# Patient Record
Sex: Female | Born: 1980 | Race: White | Hispanic: No | Marital: Single | Smoking: Current some day smoker
Health system: Southern US, Community
[De-identification: ages and names within clinical notes are randomized; demographics above are authoritative.]

## PROBLEM LIST (undated history)

## (undated) DIAGNOSIS — F1010 Alcohol abuse, uncomplicated: Secondary | ICD-10-CM

## (undated) DIAGNOSIS — S069X9A Unspecified intracranial injury with loss of consciousness of unspecified duration, initial encounter: Secondary | ICD-10-CM

## (undated) DIAGNOSIS — R7401 Elevation of levels of liver transaminase levels: Secondary | ICD-10-CM

## (undated) DIAGNOSIS — I609 Nontraumatic subarachnoid hemorrhage, unspecified: Secondary | ICD-10-CM

## (undated) DIAGNOSIS — G40909 Epilepsy, unspecified, not intractable, without status epilepticus: Secondary | ICD-10-CM

## (undated) DIAGNOSIS — S069XAA Unspecified intracranial injury with loss of consciousness status unknown, initial encounter: Secondary | ICD-10-CM

## (undated) HISTORY — PX: NO PAST SURGERIES: SHX2092

## (undated) HISTORY — PX: CHOLECYSTECTOMY: SHX55

## (undated) HISTORY — DX: Unspecified intracranial injury with loss of consciousness of unspecified duration, initial encounter: S06.9X9A

## (undated) HISTORY — DX: Elevation of levels of liver transaminase levels: R74.01

---

## 2013-03-05 ENCOUNTER — Emergency Department (HOSPITAL_COMMUNITY)
Admission: EM | Admit: 2013-03-05 | Discharge: 2013-03-05 | Disposition: A | Discharge status: Home / Self Care | Payer: Self-pay | Attending: Emergency Medicine | Admitting: Emergency Medicine

## 2013-03-05 ENCOUNTER — Encounter (HOSPITAL_COMMUNITY): Payer: Self-pay | Admitting: Emergency Medicine

## 2013-03-05 DIAGNOSIS — F17200 Nicotine dependence, unspecified, uncomplicated: Secondary | ICD-10-CM | POA: Insufficient documentation

## 2013-03-05 DIAGNOSIS — S0501XA Injury of conjunctiva and corneal abrasion without foreign body, right eye, initial encounter: Secondary | ICD-10-CM

## 2013-03-05 DIAGNOSIS — Z880 Allergy status to penicillin: Secondary | ICD-10-CM | POA: Insufficient documentation

## 2013-03-05 DIAGNOSIS — S058X9A Other injuries of unspecified eye and orbit, initial encounter: Secondary | ICD-10-CM | POA: Insufficient documentation

## 2013-03-05 DIAGNOSIS — S01111A Laceration without foreign body of right eyelid and periocular area, initial encounter: Secondary | ICD-10-CM

## 2013-03-05 MED ORDER — ERYTHROMYCIN 5 MG/GM OP OINT
TOPICAL_OINTMENT | Freq: Once | OPHTHALMIC | Status: AC
  Administered 2013-03-05: 1 via OPHTHALMIC
  Filled 2013-03-05: qty 1

## 2013-03-05 MED ORDER — FLUORESCEIN SODIUM 1 MG OP STRP
1.0000 | ORAL_STRIP | Freq: Once | OPHTHALMIC | Status: DC
  Filled 2013-03-05: qty 1

## 2013-03-05 MED ORDER — TETRACAINE HCL 0.5 % OP SOLN
1.0000 [drp] | Freq: Once | OPHTHALMIC | Status: AC
  Administered 2013-03-05: 1 [drp] via OPHTHALMIC
  Filled 2013-03-05: qty 2
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
## 2013-03-05 NOTE — ED Notes (Signed)
Tetracaine and fluorescein strip at bedside, irrigation of eye completed
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
## 2018-02-24 ENCOUNTER — Emergency Department (HOSPITAL_COMMUNITY): Payer: Managed Care, Other (non HMO)

## 2018-02-24 ENCOUNTER — Inpatient Hospital Stay (HOSPITAL_COMMUNITY)
Admission: EM | Admit: 2018-02-24 | Discharge: 2018-03-05 | DRG: 040 | Disposition: A | Discharge status: Rehabilitation Facility | Payer: Managed Care, Other (non HMO) | Attending: General Surgery | Admitting: General Surgery

## 2018-02-24 DIAGNOSIS — J9601 Acute respiratory failure with hypoxia: Secondary | ICD-10-CM | POA: Diagnosis present

## 2018-02-24 DIAGNOSIS — D62 Acute posthemorrhagic anemia: Secondary | ICD-10-CM | POA: Diagnosis present

## 2018-02-24 DIAGNOSIS — R740 Nonspecific elevation of levels of transaminase and lactic acid dehydrogenase [LDH]: Secondary | ICD-10-CM | POA: Diagnosis not present

## 2018-02-24 DIAGNOSIS — Y990 Civilian activity done for income or pay: Secondary | ICD-10-CM

## 2018-02-24 DIAGNOSIS — R0682 Tachypnea, not elsewhere classified: Secondary | ICD-10-CM | POA: Diagnosis not present

## 2018-02-24 DIAGNOSIS — R402123 Coma scale, eyes open, to pain, at hospital admission: Secondary | ICD-10-CM | POA: Diagnosis present

## 2018-02-24 DIAGNOSIS — F1010 Alcohol abuse, uncomplicated: Secondary | ICD-10-CM | POA: Diagnosis not present

## 2018-02-24 DIAGNOSIS — S069X3S Unspecified intracranial injury with loss of consciousness of 1 hour to 5 hours 59 minutes, sequela: Secondary | ICD-10-CM | POA: Diagnosis not present

## 2018-02-24 DIAGNOSIS — E876 Hypokalemia: Secondary | ICD-10-CM | POA: Diagnosis present

## 2018-02-24 DIAGNOSIS — G9340 Encephalopathy, unspecified: Secondary | ICD-10-CM | POA: Diagnosis present

## 2018-02-24 DIAGNOSIS — J189 Pneumonia, unspecified organism: Secondary | ICD-10-CM | POA: Diagnosis not present

## 2018-02-24 DIAGNOSIS — Z681 Body mass index (BMI) 19 or less, adult: Secondary | ICD-10-CM | POA: Diagnosis not present

## 2018-02-24 DIAGNOSIS — F1090 Alcohol use, unspecified, uncomplicated: Secondary | ICD-10-CM

## 2018-02-24 DIAGNOSIS — G40909 Epilepsy, unspecified, not intractable, without status epilepticus: Secondary | ICD-10-CM | POA: Diagnosis present

## 2018-02-24 DIAGNOSIS — R569 Unspecified convulsions: Secondary | ICD-10-CM

## 2018-02-24 DIAGNOSIS — F10239 Alcohol dependence with withdrawal, unspecified: Secondary | ICD-10-CM | POA: Diagnosis present

## 2018-02-24 DIAGNOSIS — S0181XA Laceration without foreign body of other part of head, initial encounter: Secondary | ICD-10-CM | POA: Diagnosis present

## 2018-02-24 DIAGNOSIS — E440 Moderate protein-calorie malnutrition: Secondary | ICD-10-CM | POA: Diagnosis present

## 2018-02-24 DIAGNOSIS — S066X9A Traumatic subarachnoid hemorrhage with loss of consciousness of unspecified duration, initial encounter: Secondary | ICD-10-CM | POA: Diagnosis present

## 2018-02-24 DIAGNOSIS — W19XXXA Unspecified fall, initial encounter: Secondary | ICD-10-CM | POA: Diagnosis present

## 2018-02-24 DIAGNOSIS — Z6822 Body mass index (BMI) 22.0-22.9, adult: Secondary | ICD-10-CM

## 2018-02-24 DIAGNOSIS — K117 Disturbances of salivary secretion: Secondary | ICD-10-CM | POA: Diagnosis present

## 2018-02-24 DIAGNOSIS — G40901 Epilepsy, unspecified, not intractable, with status epilepticus: Secondary | ICD-10-CM | POA: Diagnosis present

## 2018-02-24 DIAGNOSIS — R402353 Coma scale, best motor response, localizes pain, at hospital admission: Secondary | ICD-10-CM | POA: Diagnosis present

## 2018-02-24 DIAGNOSIS — S0101XA Laceration without foreign body of scalp, initial encounter: Secondary | ICD-10-CM | POA: Diagnosis present

## 2018-02-24 DIAGNOSIS — I10 Essential (primary) hypertension: Secondary | ICD-10-CM | POA: Diagnosis present

## 2018-02-24 DIAGNOSIS — S069X9A Unspecified intracranial injury with loss of consciousness of unspecified duration, initial encounter: Secondary | ICD-10-CM | POA: Diagnosis not present

## 2018-02-24 DIAGNOSIS — I609 Nontraumatic subarachnoid hemorrhage, unspecified: Secondary | ICD-10-CM | POA: Diagnosis not present

## 2018-02-24 DIAGNOSIS — R509 Fever, unspecified: Secondary | ICD-10-CM

## 2018-02-24 DIAGNOSIS — R402213 Coma scale, best verbal response, none, at hospital admission: Secondary | ICD-10-CM | POA: Diagnosis present

## 2018-02-24 DIAGNOSIS — R000 Tachycardia, unspecified: Secondary | ICD-10-CM

## 2018-02-24 LAB — URINALYSIS, ROUTINE W REFLEX MICROSCOPIC
Bilirubin Urine: NEGATIVE
Glucose, UA: NEGATIVE mg/dL
Ketones, ur: 80 mg/dL — AB
Nitrite: NEGATIVE
PROTEIN: 100 mg/dL — AB
SPECIFIC GRAVITY, URINE: 1.017 (ref 1.005–1.030)
pH: 5 (ref 5.0–8.0)

## 2018-02-24 LAB — PREPARE FRESH FROZEN PLASMA

## 2018-02-24 LAB — I-STAT ARTERIAL BLOOD GAS, ED
ACID-BASE DEFICIT: 7 mmol/L — AB (ref 0.0–2.0)
Bicarbonate: 18.6 mmol/L — ABNORMAL LOW (ref 20.0–28.0)
O2 SAT: 100 %
PH ART: 7.34 — AB (ref 7.350–7.450)
PO2 ART: 340 mmHg — AB (ref 83.0–108.0)
Patient temperature: 36.7
TCO2: 20 mmol/L — ABNORMAL LOW (ref 22–32)
pCO2 arterial: 34.5 mmHg (ref 32.0–48.0)

## 2018-02-24 LAB — COMPREHENSIVE METABOLIC PANEL
ALBUMIN: 3.8 g/dL (ref 3.5–5.0)
ALK PHOS: 147 U/L — AB (ref 38–126)
ALT: 90 U/L — ABNORMAL HIGH (ref 0–44)
ANION GAP: 25 — AB (ref 5–15)
AST: 359 U/L — ABNORMAL HIGH (ref 15–41)
BILIRUBIN TOTAL: 0.9 mg/dL (ref 0.3–1.2)
BUN: 5 mg/dL — ABNORMAL LOW (ref 6–20)
CALCIUM: 8.3 mg/dL — AB (ref 8.9–10.3)
CO2: 13 mmol/L — AB (ref 22–32)
CREATININE: 0.66 mg/dL (ref 0.44–1.00)
Chloride: 99 mmol/L (ref 98–111)
GFR calc non Af Amer: >60 mL/min (ref >60)
GLUCOSE: 146 mg/dL — AB (ref 70–99)
Potassium: 3.3 mmol/L — ABNORMAL LOW (ref 3.5–5.1)
SODIUM: 137 mmol/L (ref 135–145)
TOTAL PROTEIN: 7.2 g/dL (ref 6.5–8.1)

## 2018-02-24 LAB — RAPID URINE DRUG SCREEN, HOSP PERFORMED
AMPHETAMINES: NOT DETECTED
Barbiturates: NOT DETECTED
Benzodiazepines: NOT DETECTED
Cocaine: NOT DETECTED
Opiates: POSITIVE — AB
Tetrahydrocannabinol: NOT DETECTED

## 2018-02-24 LAB — CBC
HCT: 37.3 % (ref 36.0–46.0)
Hemoglobin: 11.6 g/dL — ABNORMAL LOW (ref 12.0–15.0)
MCH: 36.7 pg — AB (ref 26.0–34.0)
MCHC: 31.1 g/dL (ref 30.0–36.0)
MCV: 118 fL — AB (ref 80.0–100.0)
PLATELETS: 177 10*3/uL (ref 150–400)
RBC: 3.16 MIL/uL — ABNORMAL LOW (ref 3.87–5.11)
RDW: 13.4 % (ref 11.5–15.5)
WBC: 13.7 10*3/uL — ABNORMAL HIGH (ref 4.0–10.5)
nRBC: 0 % (ref 0.0–0.2)

## 2018-02-24 LAB — BPAM FFP
BLOOD PRODUCT EXPIRATION DATE: 201911162359
Blood Product Expiration Date: 201911162359
ISSUE DATE / TIME: 201911111941
ISSUE DATE / TIME: 201911111941
Unit Type and Rh: 6200
Unit Type and Rh: 6200

## 2018-02-24 LAB — PROTIME-INR
INR: 1.08
Prothrombin Time: 13.9 seconds (ref 11.4–15.2)

## 2018-02-24 LAB — I-STAT CHEM 8, ED
BUN: 4 mg/dL — ABNORMAL LOW (ref 6–20)
CALCIUM ION: 1.02 mmol/L — AB (ref 1.15–1.40)
CHLORIDE: 102 mmol/L (ref 98–111)
Creatinine, Ser: 0.4 mg/dL — ABNORMAL LOW (ref 0.44–1.00)
GLUCOSE: 146 mg/dL — AB (ref 70–99)
HCT: 39 % (ref 36.0–46.0)
Hemoglobin: 13.3 g/dL (ref 12.0–15.0)
POTASSIUM: 3.5 mmol/L (ref 3.5–5.1)
Sodium: 136 mmol/L (ref 135–145)
TCO2: 17 mmol/L — ABNORMAL LOW (ref 22–32)

## 2018-02-24 LAB — I-STAT CG4 LACTIC ACID, ED: LACTIC ACID, VENOUS: 15.3 mmol/L — AB (ref 0.5–1.9)

## 2018-02-24 LAB — ABO/RH: ABO/RH(D): O POS

## 2018-02-24 LAB — ETHANOL: Alcohol, Ethyl (B): 11 mg/dL — ABNORMAL HIGH (ref <10)

## 2018-02-24 LAB — TRIGLYCERIDES: Triglycerides: 148 mg/dL (ref <150)

## 2018-02-24 MED ORDER — POTASSIUM CHLORIDE IN NACL 20-0.9 MEQ/L-% IV SOLN
INTRAVENOUS | Status: DC
  Administered 2018-02-24 – 2018-03-02 (×10): via INTRAVENOUS
  Filled 2018-02-24 (×11): qty 1000

## 2018-02-24 MED ORDER — LORAZEPAM 2 MG/ML IJ SOLN
INTRAMUSCULAR | Status: AC
  Filled 2018-02-24: qty 1

## 2018-02-24 MED ORDER — FENTANYL 2500MCG IN NS 250ML (10MCG/ML) PREMIX INFUSION
25.0000 ug/h | INTRAVENOUS | Status: DC
  Administered 2018-02-24: 50 ug/h via INTRAVENOUS
  Filled 2018-02-24: qty 250

## 2018-02-24 MED ORDER — ETOMIDATE 2 MG/ML IV SOLN
INTRAVENOUS | Status: AC | PRN
  Administered 2018-02-24: 20 mg via INTRAVENOUS

## 2018-02-24 MED ORDER — FENTANYL CITRATE (PF) 100 MCG/2ML IJ SOLN
INTRAMUSCULAR | Status: AC | PRN
  Administered 2018-02-24: 100 ug via INTRAVENOUS

## 2018-02-24 MED ORDER — LORAZEPAM 2 MG/ML IJ SOLN: 2.0000 mg | Freq: Once | INTRAMUSCULAR | Status: DC

## 2018-02-24 MED ORDER — LORAZEPAM 2 MG/ML IJ SOLN
INTRAMUSCULAR | Status: AC | PRN
  Administered 2018-02-24: 2 mg via INTRAVENOUS

## 2018-02-24 MED ORDER — FENTANYL CITRATE (PF) 100 MCG/2ML IJ SOLN
INTRAMUSCULAR | Status: AC
  Filled 2018-02-24: qty 2

## 2018-02-24 MED ORDER — ORAL CARE MOUTH RINSE
15.0000 mL | OROMUCOSAL | Status: DC
  Administered 2018-02-24 – 2018-02-25 (×5): 15 mL via OROMUCOSAL

## 2018-02-24 MED ORDER — LEVETIRACETAM IN NACL 1000 MG/100ML IV SOLN
1000.0000 mg | Freq: Once | INTRAVENOUS | Status: AC
  Administered 2018-02-24: 1000 mg via INTRAVENOUS

## 2018-02-24 MED ORDER — SODIUM CHLORIDE 0.9 % IV SOLN
INTRAVENOUS | Status: AC | PRN
  Administered 2018-02-24: 1000 mL via INTRAVENOUS

## 2018-02-24 MED ORDER — PROPOFOL 1000 MG/100ML IV EMUL
5.0000 ug/kg/min | Freq: Once | INTRAVENOUS | Status: AC
  Administered 2018-02-24: 10 ug/kg/min via INTRAVENOUS

## 2018-02-24 MED ORDER — PROPOFOL 1000 MG/100ML IV EMUL
INTRAVENOUS | Status: AC
  Filled 2018-02-24: qty 100

## 2018-02-24 MED ORDER — LEVETIRACETAM IN NACL 1000 MG/100ML IV SOLN
1000.0000 mg | INTRAVENOUS | Status: AC
  Administered 2018-02-24: 1000 mg via INTRAVENOUS
  Filled 2018-02-24: qty 100

## 2018-02-24 MED ORDER — CHLORHEXIDINE GLUCONATE 0.12% ORAL RINSE (MEDLINE KIT)
15.0000 mL | Freq: Two times a day (BID) | OROMUCOSAL | Status: DC
  Administered 2018-02-24 – 2018-02-25 (×2): 15 mL via OROMUCOSAL

## 2018-02-24 MED ORDER — FENTANYL CITRATE (PF) 100 MCG/2ML IJ SOLN
INTRAMUSCULAR | Status: AC | PRN
  Administered 2018-02-24: 50 ug via INTRAVENOUS

## 2018-02-24 MED ORDER — FENTANYL CITRATE (PF) 100 MCG/2ML IJ SOLN: 50.0000 ug | Freq: Once | INTRAMUSCULAR | Status: DC

## 2018-02-24 MED ORDER — PROPOFOL 1000 MG/100ML IV EMUL
0.0000 ug/kg/min | INTRAVENOUS | Status: DC
  Administered 2018-02-24: 20 ug/kg/min via INTRAVENOUS
  Administered 2018-02-25: 40 ug/kg/min via INTRAVENOUS
  Filled 2018-02-24: qty 100

## 2018-02-24 MED ORDER — ROCURONIUM BROMIDE 50 MG/5ML IV SOLN
INTRAVENOUS | Status: AC | PRN
  Administered 2018-02-24: 50 mg via INTRAVENOUS

## 2018-02-24 MED ORDER — FENTANYL BOLUS VIA INFUSION
50.0000 ug | INTRAVENOUS | Status: DC | PRN
  Administered 2018-02-24: 50 ug via INTRAVENOUS
  Filled 2018-02-24: qty 50

## 2018-02-24 NOTE — Progress Notes (Signed)
Patient transported from ED Trauma C to 4N 28 with no complications.

## 2018-02-24 NOTE — Progress Notes (Signed)
Patient transported to CT and back on ventilator with no complications.
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
## 2018-02-24 NOTE — Consult Note (Signed)
Reason for Consult: Subarachnoid hemorrhage Referring Physician: Dr. Alda Berthold [name] is an 37 y.o. female.  HPI: The patient is a 37 year old white female who was found body and unresponsive in the break room at a [employer] store's evening.  She was brought to [facility] via EMS.  Patient was unresponsive initially intubated.  She was worked up with a head CT which demonstrated bilateral sylvian fissure subarachnoid hemorrhage, a large right scalp soft tissue swelling, etc.  Patient has been seen by neurology and trauma surgery.  Dr. Redmond Pulling requested a neurosurgical consultation.  The patient was intubated about an hour ago and was given succinylcholine, Versed and Ativan and started on a propofol drip.  Dr. Redmond Pulling has repaired her scalp lacerations.  There is no family members around to provide history or consent.  I spoke with 1 of her coworkers at [employer] but he did have a lot of details and was not present for the event.  No past medical history on file.    No family history on file.  Social History:  has no tobacco, alcohol, and drug history on file.  Allergies: No Known Allergies  Medications:  I have reviewed the patient's current medications. Prior to Admission:  (Not in a hospital admission) Scheduled: . LORazepam  2 mg Intravenous Once   Continuous:  [id number] Anti-infectives (From admission, onward)   None       Results for orders placed or performed during the hospital encounter of 02/24/18 (from the past 48 hour(s))  Type and screen Ordered by PROVIDER DEFAULT     Status: None   Collection Time: 02/24/18  7:40 Little  Result Value Ref Range   ABO/RH(D) O POS    Antibody Screen NEG    Sample Expiration 02/27/2018    Unit Number [id number]    Blood Component Type RED CELLS,LR    Unit division 00    Status of Unit REL FROM [facility]    Unit tag comment EMERGENCY RELEASE    Transfusion Status OK TO TRANSFUSE    Crossmatch Result      NOT  NEEDED Performed at [facility], [address] [place], [place] [address]    Unit Number [id number]    Blood Component Type RED CELLS,LR    Unit division 00    Status of Unit REL FROM [facility]    Unit tag comment EMERGENCY RELEASE    Transfusion Status OK TO TRANSFUSE    Crossmatch Result NOT NEEDED   Prepare fresh frozen plasma     Status: None   Collection Time: 02/24/18  7:40 Little  Result Value Ref Range   Unit Number [id number]    Blood Component Type THW PLS APHR    Unit division A0    Status of Unit REL FROM [facility]    Unit tag comment EMERGENCY RELEASE    Transfusion Status OK TO TRANSFUSE    Unit Number [id number]    Blood Component Type THW PLS APHR    Unit division B0    Status of Unit REL FROM [facility]    Unit tag comment EMERGENCY RELEASE    Transfusion Status      OK TO TRANSFUSE Performed at [facility], [place] [address] [place], [place] [address]   Comprehensive metabolic panel     Status: Abnormal   Collection Time: 02/24/18  7:40 Little  Result Value Ref Range   Sodium 137 135 - 145 mmol/L   Potassium 3.3 (L) 3.5 -  5.1 mmol/L   Chloride 99 98 - 111 mmol/L   CO2 13 (L) 22 - 32 mmol/L   Glucose, Bld 146 (H) 70 - 99 mg/dL   BUN 5 (L) 6 - 20 mg/dL   Creatinine, Ser 0.66 0.44 - 1.00 mg/dL   Calcium 8.3 (L) 8.9 - 10.3 mg/dL   Total Protein 7.2 6.5 - 8.1 g/dL   Albumin 3.8 3.5 - 5.0 g/dL   AST 359 (H) 15 - 41 U/L   ALT 90 (H) 0 - 44 U/L   Alkaline Phosphatase 147 (H) 38 - 126 U/L   Total Bilirubin 0.9 0.3 - 1.2 mg/dL   GFR calc non Af Amer >60 >60 mL/min   GFR calc Af Amer >60 >60 mL/min    Comment: (NOTE) The eGFR has been calculated using the CKD EPI equation. This calculation has not been validated in all clinical situations. eGFR's persistently <60 mL/min signify possible Chronic Kidney Disease.    Anion gap 25 (H) 5 - 15    Comment: Performed at [facility], [place] [address] [place], [place] [address]  CBC     Status: Abnormal    Collection Time: 02/24/18  7:40 Little  Result Value Ref Range   WBC 13.7 (H) 4.0 - 10.5 K/uL   RBC 3.16 (L) 3.87 - 5.11 MIL/uL   Hemoglobin 11.6 (L) 12.0 - 15.0 g/dL   HCT 37.3 36.0 - 46.0 %   MCV 118.0 (H) 80.0 - 100.0 fL   MCH 36.7 (H) 26.0 - 34.0 pg   MCHC 31.1 30.0 - 36.0 g/dL   RDW 13.4 11.5 - 15.5 %   Platelets 177 150 - 400 K/uL   nRBC 0.0 0.0 - 0.2 %    Comment: Performed at [facility], [place] [address], [place], [place] [address]  Ethanol     Status: Abnormal   Collection Time: 02/24/18  7:40 Little  Result Value Ref Range   Alcohol, Ethyl (B) 11 (H) <10 mg/dL    Comment: (NOTE) Lowest detectable limit for serum alcohol is 10 mg/dL. For medical purposes only. Performed at [facility], [place] [address] [place], [place] [address]   Protime-INR     Status: None   Collection Time: 02/24/18  7:40 Little  Result Value Ref Range   Prothrombin Time 13.9 11.4 - 15.2 seconds   INR 1.08     Comment: Performed at [place] [address], [place], [place] [address]  ABO/Rh     Status: None   Collection Time: 02/24/18  7:40 Little  Result Value Ref Range   ABO/RH(D)      O POS Performed at [place] [address] [place], [place] [address]   I-Stat Chem 8, ED     Status: Abnormal   Collection Time: 02/24/18  7:46 Little  Result Value Ref Range   Sodium 136 135 - 145 mmol/L   Potassium 3.5 3.5 - 5.1 mmol/L   Chloride 102 98 - 111 mmol/L   BUN 4 (L) 6 - 20 mg/dL    Comment: QA FLAGS AND/OR RANGES MODIFIED BY DEMOGRAPHIC UPDATE ON 11/11 AT 2006   Creatinine, Ser 0.40 (L) 0.44 - 1.00 mg/dL   Glucose, Bld 146 (H) 70 - 99 mg/dL   Calcium, Ion 1.02 (L) 1.15 - 1.40 mmol/L   TCO2 17 (L) 22 - 32 mmol/L   Hemoglobin 13.3 12.0 - 15.0 g/dL   HCT 39.0 36.0 - 46.0 %  I-Stat CG4 Lactic Acid, ED     Status: Abnormal   Collection Time: 02/24/18  7:47 Little  Result Value Ref Range   Lactic Acid, Venous 15.30 (HH) 0.5 - 1.9 mmol/L   Comment NOTIFIED PHYSICIAN   I-Stat  arterial blood gas, ED     Status: Abnormal   Collection Time: 02/24/18  8:38 Little  Result Value Ref Range   pH, Arterial 7.340 (L) 7.350 - 7.450   pCO2 arterial 34.5 32.0 - 48.0 mmHg   pO2, Arterial 340.0 (H) 83.0 - 108.0 mmHg   Bicarbonate 18.6 (L) 20.0 - 28.0 mmol/L   TCO2 20 (L) 22 - 32 mmol/L   O2 Saturation 100.0 %   Acid-base deficit 7.0 (H) 0.0 - 2.0 mmol/L   Patient temperature 36.7 C    Collection site RADIAL, ALLEN'S TEST ACCEPTABLE    Drawn by Operator    Sample type ARTERIAL     Ct Head Wo Contrast  Result Date: Alyssa Little CLINICAL DATA:  Female of unknown age found down. Unknown exact trauma mechanism. Seizing, altered mental status. Intubated for airway protection. EXAM: CT HEAD WITHOUT CONTRAST CT MAXILLOFACIAL WITHOUT CONTRAST CT CERVICAL SPINE WITHOUT CONTRAST TECHNIQUE: Multidetector CT imaging of the head, cervical spine, and maxillofacial structures were performed using the standard protocol without intravenous contrast. Multiplanar CT image reconstructions of the cervical spine and maxillofacial structures were also generated. COMPARISON:  Portable chest 1938 hours today. FINDINGS: CT HEAD FINDINGS Brain: Small to moderate volume of subarachnoid hemorrhage along both sylvian fissures and the bilateral superior and lateral convexities. The basilar cisterns are spared. There is slightly more blood along the right sylvian fissure and hemisphere. Superimposed small volume of right lateral intraventricular hemorrhage. No subdural hemorrhage identified. No ventriculomegaly. No intracranial mass effect or midline shift. Basilar cisterns remain normal. Gray-white matter differentiation is within normal limits throughout the brain. No cortically based acute infarct identified. Vascular: No suspicious intracranial vascular hyperdensity. Skull: No skull fracture identified. Other: Large right anterior convexity scalp hematoma up to 2.3 centimeters in thickness with superimposed small  volume scalp soft tissue gas. Other scalp soft tissues are within normal limits. CT MAXILLOFACIAL FINDINGS Osseous: Mandible intact. Maxilla and zygoma intact. Nasal bones within normal limits. No acute facial fracture identified. Orbits: Orbital walls intact. Orbit soft tissues are within normal limits. Sinuses: Well pneumatized. There is fluid or debris in the right external auditory canal, but the right tympanic membrane and mastoids remain clear. Left middle ear and mastoids are clear. Soft tissues: Fluid in the pharynx in the setting of intubation. ET tube and oral enteric tube in place. Negative noncontrast deep soft tissue spaces of the face. CT CERVICAL SPINE FINDINGS Alignment: Mild straightening of cervical lordosis. Cervicothoracic junction alignment is within normal limits. Bilateral posterior element alignment is within normal limits. Skull base and vertebrae: Visualized skull base is intact. No atlanto-occipital dissociation. No cervical spine fracture. Soft tissues and spinal canal: No prevertebral fluid or swelling. No visible canal hematoma. Endotracheal tube and enteric tube course appropriately into the trachea and upper esophagus. Disc levels:  Minimal degenerative changes. Upper chest: Negative lung apices. Visible upper thoracic levels appear intact. IMPRESSION: 1. Small to moderate volume of presumed post-traumatic Subarachnoid Hemorrhage along both Sylvian fissures and the cerebral convexities, more so on the right. The basilar cisterns are spared. 2. Superimposed small volume right lateral Intraventricular Hemorrhage. No ventriculomegaly. 3. No cerebral hemorrhagic contusion identified. No intracranial mass effect. No acute cortically based infarct. 4. Large right anterior convexity  scalp hematoma but no underlying skull fracture identified. 5. The above critical value/emergent results were discussed by telephone with Dr. Redmond Pulling of Trauma Surgery at 2016 hours on Alyssa Little. 6. No face or  cervical spine fracture. No other acute traumatic injury identified. Electronically Signed   By: Genevie Ann M.D.   On: Alyssa Little 20:32   Ct Cervical Spine Wo Contrast  Result Date: Alyssa Little CLINICAL DATA:  Female of unknown age found down. Unknown exact trauma mechanism. Seizing, altered mental status. Intubated for airway protection. EXAM: CT HEAD WITHOUT CONTRAST CT MAXILLOFACIAL WITHOUT CONTRAST CT CERVICAL SPINE WITHOUT CONTRAST TECHNIQUE: Multidetector CT imaging of the head, cervical spine, and maxillofacial structures were performed using the standard protocol without intravenous contrast. Multiplanar CT image reconstructions of the cervical spine and maxillofacial structures were also generated. COMPARISON:  Portable chest 1938 hours today. FINDINGS: CT HEAD FINDINGS Brain: Small to moderate volume of subarachnoid hemorrhage along both sylvian fissures and the bilateral superior and lateral convexities. The basilar cisterns are spared. There is slightly more blood along the right sylvian fissure and hemisphere. Superimposed small volume of right lateral intraventricular hemorrhage. No subdural hemorrhage identified. No ventriculomegaly. No intracranial mass effect or midline shift. Basilar cisterns remain normal. Gray-white matter differentiation is within normal limits throughout the brain. No cortically based acute infarct identified. Vascular: No suspicious intracranial vascular hyperdensity. Skull: No skull fracture identified. Other: Large right anterior convexity scalp hematoma up to 2.3 centimeters in thickness with superimposed small volume scalp soft tissue gas. Other scalp soft tissues are within normal limits. CT MAXILLOFACIAL FINDINGS Osseous: Mandible intact. Maxilla and zygoma intact. Nasal bones within normal limits. No acute facial fracture identified. Orbits: Orbital walls intact. Orbit soft tissues are within normal limits. Sinuses: Well pneumatized. There is fluid or debris in the  right external auditory canal, but the right tympanic membrane and mastoids remain clear. Left middle ear and mastoids are clear. Soft tissues: Fluid in the pharynx in the setting of intubation. ET tube and oral enteric tube in place. Negative noncontrast deep soft tissue spaces of the face. CT CERVICAL SPINE FINDINGS Alignment: Mild straightening of cervical lordosis. Cervicothoracic junction alignment is within normal limits. Bilateral posterior element alignment is within normal limits. Skull base and vertebrae: Visualized skull base is intact. No atlanto-occipital dissociation. No cervical spine fracture. Soft tissues and spinal canal: No prevertebral fluid or swelling. No visible canal hematoma. Endotracheal tube and enteric tube course appropriately into the trachea and upper esophagus. Disc levels:  Minimal degenerative changes. Upper chest: Negative lung apices. Visible upper thoracic levels appear intact. IMPRESSION: 1. Small to moderate volume of presumed post-traumatic Subarachnoid Hemorrhage along both Sylvian fissures and the cerebral convexities, more so on the right. The basilar cisterns are spared. 2. Superimposed small volume right lateral Intraventricular Hemorrhage. No ventriculomegaly. 3. No cerebral hemorrhagic contusion identified. No intracranial mass effect. No acute cortically based infarct. 4. Large right anterior convexity scalp hematoma but no underlying skull fracture identified. 5. The above critical value/emergent results were discussed by telephone with Dr. Redmond Pulling of Trauma Surgery at 2016 hours on Alyssa Little. 6. No face or cervical spine fracture. No other acute traumatic injury identified. Electronically Signed   By: Genevie Ann M.D.   On: Alyssa Little 20:32   Dg Chest Port 1 View  Result Date: Alyssa Little CLINICAL DATA:  Seizure with fall. Intubation and OG tube placement. EXAM: PORTABLE CHEST 1 VIEW COMPARISON:  None. FINDINGS: Endotracheal tube terminates at the level of the  clavicles. Enteric tube courses  into the stomach with tip below the inferior margin of the image. Tubing projecting over the lateral right chest is presumably external to the patient. The cardiomediastinal silhouette is within normal limits. No airspace consolidation, edema, pleural effusion, pneumothorax is identified. No acute osseous abnormality is identified. IMPRESSION: Support devices as above. No evidence of acute airspace disease. Electronically Signed   By: Logan Bores M.D.   On: Alyssa Little 20:04   Ct Maxillofacial Wo Contrast  Result Date: Alyssa Little CLINICAL DATA:  Female of unknown age found down. Unknown exact trauma mechanism. Seizing, altered mental status. Intubated for airway protection. EXAM: CT HEAD WITHOUT CONTRAST CT MAXILLOFACIAL WITHOUT CONTRAST CT CERVICAL SPINE WITHOUT CONTRAST TECHNIQUE: Multidetector CT imaging of the head, cervical spine, and maxillofacial structures were performed using the standard protocol without intravenous contrast. Multiplanar CT image reconstructions of the cervical spine and maxillofacial structures were also generated. COMPARISON:  Portable chest 1938 hours today. FINDINGS: CT HEAD FINDINGS Brain: Small to moderate volume of subarachnoid hemorrhage along both sylvian fissures and the bilateral superior and lateral convexities. The basilar cisterns are spared. There is slightly more blood along the right sylvian fissure and hemisphere. Superimposed small volume of right lateral intraventricular hemorrhage. No subdural hemorrhage identified. No ventriculomegaly. No intracranial mass effect or midline shift. Basilar cisterns remain normal. Gray-white matter differentiation is within normal limits throughout the brain. No cortically based acute infarct identified. Vascular: No suspicious intracranial vascular hyperdensity. Skull: No skull fracture identified. Other: Large right anterior convexity scalp hematoma up to 2.3 centimeters in thickness with  superimposed small volume scalp soft tissue gas. Other scalp soft tissues are within normal limits. CT MAXILLOFACIAL FINDINGS Osseous: Mandible intact. Maxilla and zygoma intact. Nasal bones within normal limits. No acute facial fracture identified. Orbits: Orbital walls intact. Orbit soft tissues are within normal limits. Sinuses: Well pneumatized. There is fluid or debris in the right external auditory canal, but the right tympanic membrane and mastoids remain clear. Left middle ear and mastoids are clear. Soft tissues: Fluid in the pharynx in the setting of intubation. ET tube and oral enteric tube in place. Negative noncontrast deep soft tissue spaces of the face. CT CERVICAL SPINE FINDINGS Alignment: Mild straightening of cervical lordosis. Cervicothoracic junction alignment is within normal limits. Bilateral posterior element alignment is within normal limits. Skull base and vertebrae: Visualized skull base is intact. No atlanto-occipital dissociation. No cervical spine fracture. Soft tissues and spinal canal: No prevertebral fluid or swelling. No visible canal hematoma. Endotracheal tube and enteric tube course appropriately into the trachea and upper esophagus. Disc levels:  Minimal degenerative changes. Upper chest: Negative lung apices. Visible upper thoracic levels appear intact. IMPRESSION: 1. Small to moderate volume of presumed post-traumatic Subarachnoid Hemorrhage along both Sylvian fissures and the cerebral convexities, more so on the right. The basilar cisterns are spared. 2. Superimposed small volume right lateral Intraventricular Hemorrhage. No ventriculomegaly. 3. No cerebral hemorrhagic contusion identified. No intracranial mass effect. No acute cortically based infarct. 4. Large right anterior convexity scalp hematoma but no underlying skull fracture identified. 5. The above critical value/emergent results were discussed by telephone with Dr. Redmond Pulling of Trauma Surgery at 2016 hours on  Alyssa Little. 6. No face or cervical spine fracture. No other acute traumatic injury identified. Electronically Signed   By: Genevie Ann M.D.   On: Alyssa Little 20:32    ROS: Unobtainable Blood pressure 102/72, pulse (!) 136, temperature 98.6 F (37 C), resp. rate (!) 21, height _0  (1.549 m), weight 54.4 kg, SpO2  100 %. Estimated body mass index is 22.67 kg/m as calculated from the following:   Height as of this encounter: _0  (1.549 m).   Weight as of this encounter: 54.4 kg.  Physical Exam  General: A comatose intubated 37 year old white female with right scalp lacerations  HEENT: The patient's pupils are approximately 4 mm bilaterally.  She has conjugate gaze.  He has frontal scalp lacerations which have been suture repaired.  Neck: Supple in a cervical collar  Thorax: Symmetric  Abdomen: Soft  Extremities: Unremarkable  Neurologic exam: Patient's propofol has been discontinued.  He is somnolent but responds to noxious stimuli.  Glasgow Coma Scale 8 intubated, [id number].  She localizes and is purposeful bilaterally moving all 4 extremities.  Pupils are equal and somewhat dilated as above.  I reviewed the patient's head CT performed at [facility] today.  She has bilateral subarachnoid hemorrhage predominantly in the sylvian fissure and along the convexity.  Her basal cisterns are clear.  Is a small amount of intraventricular blood.  I have also reviewed patient's cervical CT performed at [facility] today.  There is minimal degenerative changes.  No acute fractures, soft tissue swelling, etc.  Assessment/Plan: Traumatic subarachnoid hemorrhage, intraventricular hemorrhage: We were preparing to place a emergency ventriculostomy to monitor her ICP when the patient's neurologic status began to improve.  I suspect she had a seizure or syncopal event and struck her head.  She seems to be improving and therefore I do not think she needs a ventriculostomy presently.  We plan to  minimize her sedation and will follow her clinical exam and plan to repeat her CAT scan tomorrow morning.  Alyssa Little Alyssa Little, Alyssa Little

## 2018-02-24 NOTE — H&P (Addendum)
History   Alyssa Little is an 37 y.o. female.   Chief Complaint: No chief complaint on file.   HPI 37 yo wf brought in as level 1 trauma from work. She reportedly fell with head trauma and seizures. Unclear which happened first. Found in break room convulsing with bleeding scalp wound. She received versed en route by EMS for sz.  In ED she received ativan for ongoing sz activity. EDP reported pt had lateral gaze. Intubated for low GCS. Blood sugar ok. For most of trauma workup pt was tachycardic at times up to 175 and HTN. She also recvd keppra in ED.  Unknown history.  No past medical history on file.    No family history on file. Social History:  has no tobacco, alcohol, and drug history on file.  Allergies  No Known Allergies  Home Medications   (Not in a hospital admission)  Trauma Course   Results for orders placed or performed during the hospital encounter of 02/24/18 (from the past 48 hour(s))  Type and screen Ordered by PROVIDER DEFAULT     Status: None   Collection Time: 02/24/18  7:40 PM  Result Value Ref Range   ABO/RH(D) O POS    Antibody Screen NEG    Sample Expiration 02/27/2018    Unit Number [id number]    Blood Component Type RED CELLS,LR    Unit division 00    Status of Unit REL FROM [facility]    Unit tag comment EMERGENCY RELEASE    Transfusion Status OK TO TRANSFUSE    Crossmatch Result      NOT NEEDED Performed at [facility], [address] [place], [place] [address]    Unit Number [id number]    Blood Component Type RED CELLS,LR    Unit division 00    Status of Unit REL FROM [facility]    Unit tag comment EMERGENCY RELEASE    Transfusion Status OK TO TRANSFUSE    Crossmatch Result NOT NEEDED   Prepare fresh frozen plasma     Status: None   Collection Time: 02/24/18  7:40 PM  Result Value Ref Range   Unit Number [id number]    Blood Component Type THW PLS APHR    Unit division A0    Status of Unit REL FROM [facility]    Unit  tag comment EMERGENCY RELEASE    Transfusion Status OK TO TRANSFUSE    Unit Number [id number]    Blood Component Type THW PLS APHR    Unit division B0    Status of Unit REL FROM [facility]    Unit tag comment EMERGENCY RELEASE    Transfusion Status      OK TO TRANSFUSE Performed at [facility], [place] [address] [place], [place] [address]   Comprehensive metabolic panel     Status: Abnormal   Collection Time: 02/24/18  7:40 PM  Result Value Ref Range   Sodium 137 135 - 145 mmol/L   Potassium 3.3 (L) 3.5 - 5.1 mmol/L   Chloride 99 98 - 111 mmol/L   CO2 13 (L) 22 - 32 mmol/L   Glucose, Bld 146 (H) 70 - 99 mg/dL   BUN 5 (L) 6 - 20 mg/dL   Creatinine, Ser 0.66 0.44 - 1.00 mg/dL   Calcium 8.3 (L) 8.9 - 10.3 mg/dL   Total Protein 7.2 6.5 - 8.1 g/dL   Albumin 3.8 3.5 - 5.0 g/dL   AST 359 (H) 15 - 41 U/L  ALT 90 (H) 0 - 44 U/L   Alkaline Phosphatase 147 (H) 38 - 126 U/L   Total Bilirubin 0.9 0.3 - 1.2 mg/dL   GFR calc non Af Amer >60 >60 mL/min   GFR calc Af Amer >60 >60 mL/min    Comment: (NOTE) The eGFR has been calculated using the CKD EPI equation. This calculation has not been validated in all clinical situations. eGFR's persistently <60 mL/min signify possible Chronic Kidney Disease.    Anion gap 25 (H) 5 - 15    Comment: Performed at [facility], [place] [address] [place], [place] [address]  CBC     Status: Abnormal   Collection Time: 02/24/18  7:40 PM  Result Value Ref Range   WBC 13.7 (H) 4.0 - 10.5 K/uL   RBC 3.16 (L) 3.87 - 5.11 MIL/uL   Hemoglobin 11.6 (L) 12.0 - 15.0 g/dL   HCT 37.3 36.0 - 46.0 %   MCV 118.0 (H) 80.0 - 100.0 fL   MCH 36.7 (H) 26.0 - 34.0 pg   MCHC 31.1 30.0 - 36.0 g/dL   RDW 13.4 11.5 - 15.5 %   Platelets 177 150 - 400 K/uL   nRBC 0.0 0.0 - 0.2 %    Comment: Performed at [facility], [place] [address] [place], [place] [address]  Ethanol     Status: Abnormal   Collection Time: 02/24/18  7:40 PM  Result Value Ref Range    Alcohol, Ethyl (B) 11 (H) <10 mg/dL    Comment: (NOTE) Lowest detectable limit for serum alcohol is 10 mg/dL. For medical purposes only. Performed at [facility], [place] [address] [place], [place] [address]   Protime-INR     Status: None   Collection Time: 02/24/18  7:40 PM  Result Value Ref Range   Prothrombin Time 13.9 11.4 - 15.2 seconds   INR 1.08     Comment: Performed at [place] [address], [place], [place] [address]  ABO/Rh     Status: None   Collection Time: 02/24/18  7:40 PM  Result Value Ref Range   ABO/RH(D)      O POS Performed at [place] [address] [place],  [address]   I-Stat Chem 8, ED     Status: Abnormal   Collection Time: 02/24/18  7:46 PM  Result Value Ref Range   Sodium 136 135 - 145 mmol/L   Potassium 3.5 3.5 - 5.1 mmol/L   Chloride 102 98 - 111 mmol/L   BUN 4 (L) 6 - 20 mg/dL    Comment: QA FLAGS AND/OR RANGES MODIFIED BY DEMOGRAPHIC UPDATE ON 11/11 AT 2006   Creatinine, Ser 0.40 (L) 0.44 - 1.00 mg/dL   Glucose, Bld 146 (H) 70 - 99 mg/dL   Calcium, Ion 1.02 (L) 1.15 - 1.40 mmol/L   TCO2 17 (L) 22 - 32 mmol/L   Hemoglobin 13.3 12.0 - 15.0 g/dL   HCT 39.0 36.0 - 46.0 %  I-Stat CG4 Lactic Acid, ED     Status: Abnormal   Collection Time: 02/24/18  7:47 PM  Result Value Ref Range   Lactic Acid, Venous 15.30 (HH) 0.5 - 1.9 mmol/L   Comment NOTIFIED PHYSICIAN   I-Stat arterial blood gas, ED     Status: Abnormal   Collection Time: 02/24/18  8:38 PM  Result Value Ref Range   pH, Arterial 7.340 (L) 7.350 - 7.450   pCO2 arterial 34.5 32.0 -  48.0 mmHg   pO2, Arterial 340.0 (H) 83.0 - 108.0 mmHg   Bicarbonate 18.6 (L) 20.0 - 28.0 mmol/L   TCO2 20 (L) 22 - 32 mmol/L   O2 Saturation 100.0 %   Acid-base deficit 7.0 (H) 0.0 - 2.0 mmol/L   Patient temperature 36.7 C    Collection site RADIAL, ALLEN'S TEST ACCEPTABLE    Drawn by Operator    Sample type ARTERIAL    Ct Head Wo Contrast  Result Date:  02/24/2018 CLINICAL DATA:  Female of unknown age found down. Unknown exact trauma mechanism. Seizing, altered mental status. Intubated for airway protection. EXAM: CT HEAD WITHOUT CONTRAST CT MAXILLOFACIAL WITHOUT CONTRAST CT CERVICAL SPINE WITHOUT CONTRAST TECHNIQUE: Multidetector CT imaging of the head, cervical spine, and maxillofacial structures were performed using the standard protocol without intravenous contrast. Multiplanar CT image reconstructions of the cervical spine and maxillofacial structures were also generated. COMPARISON:  Portable chest 1938 hours today. FINDINGS: CT HEAD FINDINGS Brain: Small to moderate volume of subarachnoid hemorrhage along both sylvian fissures and the bilateral superior and lateral convexities. The basilar cisterns are spared. There is slightly more blood along the right sylvian fissure and hemisphere. Superimposed small volume of right lateral intraventricular hemorrhage. No subdural hemorrhage identified. No ventriculomegaly. No intracranial mass effect or midline shift. Basilar cisterns remain normal. Gray-white matter differentiation is within normal limits throughout the brain. No cortically based acute infarct identified. Vascular: No suspicious intracranial vascular hyperdensity. Skull: No skull fracture identified. Other: Large right anterior convexity scalp hematoma up to 2.3 centimeters in thickness with superimposed small volume scalp soft tissue gas. Other scalp soft tissues are within normal limits. CT MAXILLOFACIAL FINDINGS Osseous: Mandible intact. Maxilla and zygoma intact. Nasal bones within normal limits. No acute facial fracture identified. Orbits: Orbital walls intact. Orbit soft tissues are within normal limits. Sinuses: Well pneumatized. There is fluid or debris in the right external auditory canal, but the right tympanic membrane and mastoids remain clear. Left middle ear and mastoids are clear. Soft tissues: Fluid in the pharynx in the setting of  intubation. ET tube and oral enteric tube in place. Negative noncontrast deep soft tissue spaces of the face. CT CERVICAL SPINE FINDINGS Alignment: Mild straightening of cervical lordosis. Cervicothoracic junction alignment is within normal limits. Bilateral posterior element alignment is within normal limits. Skull base and vertebrae: Visualized skull base is intact. No atlanto-occipital dissociation. No cervical spine fracture. Soft tissues and spinal canal: No prevertebral fluid or swelling. No visible canal hematoma. Endotracheal tube and enteric tube course appropriately into the trachea and upper esophagus. Disc levels:  Minimal degenerative changes. Upper chest: Negative lung apices. Visible upper thoracic levels appear intact. IMPRESSION: 1. Small to moderate volume of presumed post-traumatic Subarachnoid Hemorrhage along both Sylvian fissures and the cerebral convexities, more so on the right. The basilar cisterns are spared. 2. Superimposed small volume right lateral Intraventricular Hemorrhage. No ventriculomegaly. 3. No cerebral hemorrhagic contusion identified. No intracranial mass effect. No acute cortically based infarct. 4. Large right anterior convexity scalp hematoma but no underlying skull fracture identified. 5. The above critical value/emergent results were discussed by telephone with Dr. Redmond Pulling of Trauma Surgery at 2016 hours on 02/24/2018. 6. No face or cervical spine fracture. No other acute traumatic injury identified. Electronically Signed   By: Genevie Ann M.D.   On: 02/24/2018 20:32   Ct Cervical Spine Wo Contrast  Result Date: 02/24/2018 CLINICAL DATA:  Female of unknown age found down. Unknown exact trauma mechanism. Seizing, altered mental  status. Intubated for airway protection. EXAM: CT HEAD WITHOUT CONTRAST CT MAXILLOFACIAL WITHOUT CONTRAST CT CERVICAL SPINE WITHOUT CONTRAST TECHNIQUE: Multidetector CT imaging of the head, cervical spine, and maxillofacial structures were performed  using the standard protocol without intravenous contrast. Multiplanar CT image reconstructions of the cervical spine and maxillofacial structures were also generated. COMPARISON:  Portable chest 1938 hours today. FINDINGS: CT HEAD FINDINGS Brain: Small to moderate volume of subarachnoid hemorrhage along both sylvian fissures and the bilateral superior and lateral convexities. The basilar cisterns are spared. There is slightly more blood along the right sylvian fissure and hemisphere. Superimposed small volume of right lateral intraventricular hemorrhage. No subdural hemorrhage identified. No ventriculomegaly. No intracranial mass effect or midline shift. Basilar cisterns remain normal. Gray-white matter differentiation is within normal limits throughout the brain. No cortically based acute infarct identified. Vascular: No suspicious intracranial vascular hyperdensity. Skull: No skull fracture identified. Other: Large right anterior convexity scalp hematoma up to 2.3 centimeters in thickness with superimposed small volume scalp soft tissue gas. Other scalp soft tissues are within normal limits. CT MAXILLOFACIAL FINDINGS Osseous: Mandible intact. Maxilla and zygoma intact. Nasal bones within normal limits. No acute facial fracture identified. Orbits: Orbital walls intact. Orbit soft tissues are within normal limits. Sinuses: Well pneumatized. There is fluid or debris in the right external auditory canal, but the right tympanic membrane and mastoids remain clear. Left middle ear and mastoids are clear. Soft tissues: Fluid in the pharynx in the setting of intubation. ET tube and oral enteric tube in place. Negative noncontrast deep soft tissue spaces of the face. CT CERVICAL SPINE FINDINGS Alignment: Mild straightening of cervical lordosis. Cervicothoracic junction alignment is within normal limits. Bilateral posterior element alignment is within normal limits. Skull base and vertebrae: Visualized skull base is intact.  No atlanto-occipital dissociation. No cervical spine fracture. Soft tissues and spinal canal: No prevertebral fluid or swelling. No visible canal hematoma. Endotracheal tube and enteric tube course appropriately into the trachea and upper esophagus. Disc levels:  Minimal degenerative changes. Upper chest: Negative lung apices. Visible upper thoracic levels appear intact. IMPRESSION: 1. Small to moderate volume of presumed post-traumatic Subarachnoid Hemorrhage along both Sylvian fissures and the cerebral convexities, more so on the right. The basilar cisterns are spared. 2. Superimposed small volume right lateral Intraventricular Hemorrhage. No ventriculomegaly. 3. No cerebral hemorrhagic contusion identified. No intracranial mass effect. No acute cortically based infarct. 4. Large right anterior convexity scalp hematoma but no underlying skull fracture identified. 5. The above critical value/emergent results were discussed by telephone with Dr. Redmond Pulling of Trauma Surgery at 2016 hours on 02/24/2018. 6. No face or cervical spine fracture. No other acute traumatic injury identified. Electronically Signed   By: Genevie Ann M.D.   On: 02/24/2018 20:32   Dg Chest Port 1 View  Result Date: 02/24/2018 CLINICAL DATA:  Seizure with fall. Intubation and OG tube placement. EXAM: PORTABLE CHEST 1 VIEW COMPARISON:  None. FINDINGS: Endotracheal tube terminates at the level of the clavicles. Enteric tube courses into the stomach with tip below the inferior margin of the image. Tubing projecting over the lateral right chest is presumably external to the patient. The cardiomediastinal silhouette is within normal limits. No airspace consolidation, edema, pleural effusion, pneumothorax is identified. No acute osseous abnormality is identified. IMPRESSION: Support devices as above. No evidence of acute airspace disease. Electronically Signed   By: Logan Bores M.D.   On: 02/24/2018 20:04   Ct Maxillofacial Wo Contrast  Result Date:  02/24/2018 CLINICAL  DATA:  Female of unknown age found down. Unknown exact trauma mechanism. Seizing, altered mental status. Intubated for airway protection. EXAM: CT HEAD WITHOUT CONTRAST CT MAXILLOFACIAL WITHOUT CONTRAST CT CERVICAL SPINE WITHOUT CONTRAST TECHNIQUE: Multidetector CT imaging of the head, cervical spine, and maxillofacial structures were performed using the standard protocol without intravenous contrast. Multiplanar CT image reconstructions of the cervical spine and maxillofacial structures were also generated. COMPARISON:  Portable chest 1938 hours today. FINDINGS: CT HEAD FINDINGS Brain: Small to moderate volume of subarachnoid hemorrhage along both sylvian fissures and the bilateral superior and lateral convexities. The basilar cisterns are spared. There is slightly more blood along the right sylvian fissure and hemisphere. Superimposed small volume of right lateral intraventricular hemorrhage. No subdural hemorrhage identified. No ventriculomegaly. No intracranial mass effect or midline shift. Basilar cisterns remain normal. Gray-white matter differentiation is within normal limits throughout the brain. No cortically based acute infarct identified. Vascular: No suspicious intracranial vascular hyperdensity. Skull: No skull fracture identified. Other: Large right anterior convexity scalp hematoma up to 2.3 centimeters in thickness with superimposed small volume scalp soft tissue gas. Other scalp soft tissues are within normal limits. CT MAXILLOFACIAL FINDINGS Osseous: Mandible intact. Maxilla and zygoma intact. Nasal bones within normal limits. No acute facial fracture identified. Orbits: Orbital walls intact. Orbit soft tissues are within normal limits. Sinuses: Well pneumatized. There is fluid or debris in the right external auditory canal, but the right tympanic membrane and mastoids remain clear. Left middle ear and mastoids are clear. Soft tissues: Fluid in the pharynx in the setting of  intubation. ET tube and oral enteric tube in place. Negative noncontrast deep soft tissue spaces of the face. CT CERVICAL SPINE FINDINGS Alignment: Mild straightening of cervical lordosis. Cervicothoracic junction alignment is within normal limits. Bilateral posterior element alignment is within normal limits. Skull base and vertebrae: Visualized skull base is intact. No atlanto-occipital dissociation. No cervical spine fracture. Soft tissues and spinal canal: No prevertebral fluid or swelling. No visible canal hematoma. Endotracheal tube and enteric tube course appropriately into the trachea and upper esophagus. Disc levels:  Minimal degenerative changes. Upper chest: Negative lung apices. Visible upper thoracic levels appear intact. IMPRESSION: 1. Small to moderate volume of presumed post-traumatic Subarachnoid Hemorrhage along both Sylvian fissures and the cerebral convexities, more so on the right. The basilar cisterns are spared. 2. Superimposed small volume right lateral Intraventricular Hemorrhage. No ventriculomegaly. 3. No cerebral hemorrhagic contusion identified. No intracranial mass effect. No acute cortically based infarct. 4. Large right anterior convexity scalp hematoma but no underlying skull fracture identified. 5. The above critical value/emergent results were discussed by telephone with Dr. Redmond Pulling of Trauma Surgery at 2016 hours on 02/24/2018. 6. No face or cervical spine fracture. No other acute traumatic injury identified. Electronically Signed   By: Genevie Ann M.D.   On: 02/24/2018 20:32    Review of Systems  Unable to perform ROS: Intubated    Blood pressure 104/78, pulse (!) 138, temperature 98.6 F (37 C), resp. rate 16, height '5\' 1"'  (1.549 m), weight 54.4 kg, SpO2 100 %. Physical Exam  Vitals reviewed. Constitutional: She appears well-developed and well-nourished. She is cooperative. No distress. Cervical collar and nasal cannula in place.  HENT:  Head: Normocephalic. Head is with  contusion and with laceration. Head is without raccoon's eyes, without Battle's sign and without abrasion.    Right Ear: Hearing, tympanic membrane, external ear and ear canal normal. No lacerations. No drainage or tenderness. No foreign bodies. Tympanic membrane is  not perforated. No hemotympanum.  Left Ear: Hearing, tympanic membrane, external ear and ear canal normal. No lacerations. No drainage or tenderness. No foreign bodies. Tympanic membrane is not perforated. No hemotympanum.  Nose: Nose normal. No nose lacerations, sinus tenderness, nasal deformity or nasal septal hematoma. No epistaxis.  Mouth/Throat: Uvula is midline, oropharynx is clear and moist and mucous membranes are normal. No lacerations.  Head wrapped in acewrap and compression; 2 right forehead lacerations. More posterior is stellate with active pulsatile bleeding ((3in); more anterior lac is just in front of other lac, linear, 3in as well.   Eyes: Pupils are equal, round, and reactive to light. Conjunctivae and lids are normal. No scleral icterus.  Neck: Trachea normal. No JVD present. No spinous process tenderness and no muscular tenderness present. Carotid bruit is not present. No tracheal deviation present. No thyromegaly present.  Cardiovascular: Regular rhythm, normal heart sounds, intact distal pulses and normal pulses. Tachycardia present.  Respiratory: Effort normal and breath sounds normal. No accessory muscle usage. No respiratory distress. She exhibits no tenderness, no bony tenderness, no laceration and no crepitus.  GI: Soft. Normal appearance. She exhibits no distension. Bowel sounds are decreased. There is no tenderness. There is no rigidity, no rebound, no guarding and no CVA tenderness.  Musculoskeletal: Normal range of motion. She exhibits no edema or tenderness.  Lymphadenopathy:    She has no cervical adenopathy.  Neurological: She has normal reflexes. She is unresponsive. No cranial nerve deficit or sensory  deficit. GCS eye subscore is 2. GCS verbal subscore is 1. GCS motor subscore is 5.  Skin: Skin is warm, dry and intact. She is not diaphoretic.  Psychiatric: She has a normal mood and affect. Her speech is normal and behavior is normal.     Assessment/Plan Fall Complex Right forehead lacerations x 2 SAH IVH Seizure activity Elevated LFTs hypokalemia  Consults - Dr Arnoldo Morale and Leonel Ramsay Admit trauma ICU Repeat labs in am Maintain c spine SCDs.   Leighton Ruff. Redmond Pulling, MD, FACS General, Bariatric, & Minimally Invasive Surgery [facility]    Greer Pickerel 02/24/2018, 9:03 PM   Procedures

## 2018-02-24 NOTE — Progress Notes (Signed)
   02/24/18 2100  Clinical Encounter Type  Visited With Patient;Other (Comment)  Visit Type Critical Care  Referral From Nurse  Consult/Referral To Chaplain   Paged to the ED at 7:35 for this patient.  Upon arrival PT being evaluated.  A friend came and was taken to consult B.  Spoke with friend trying to obtain any family information.  Friend did not have any contact information.  We were able to get into the PT's cell phone and friend identified a name Catlin as the PT's Aunt.  Cell is [phone].  VM left for the Aunt to call the [facility] ED.  Let medical staff know a family member had been contacted.  PT continues to be evaluated and Chaplain available to assist further when family calls back.  Chaplain Agustin Cree

## 2018-02-24 NOTE — Consult Note (Signed)
Neurology Consultation Reason for Consult: Seizures Referring Physician: Tegeler, C  CC: Seizure  History is obtained from: EMS  HPI: Alyssa Little is a 37 y.o. female who presents to the emergency department with fall, head trauma, seizures which started earlier tonight.  Unclear last time she was well, but she was found in the break room convulsing with bloody injury to her scalp.  It is unclear which came first.  She was seen to be having abnormal movements, which I suspect are seizure versus posturing on route, and was given Versed and subsequently Ativan.  Currently there is no known past medical history, no known substance use history, she does have cigarettes on her person, but otherwise we have very limited information.   ROS: Unable to obtain due to altered mental status.   Past medical history: Unable to obtain due to altered mental status  Family history: Unable to obtain due to altered mental status  Social History: She had cigarettes on her person, otherwise unable to obtain due to altered mental status  Exam: Current vital signs: BP (!) 150/100   Pulse (!) 126   Temp (!) 97.5 F (36.4 C)   Resp 12   Ht 5\' 7"  (1.702 m)   Wt 54.4 kg   LMP  (LMP Unknown)   SpO2 100%   BMI 18.79 kg/m  Vital signs in last 24 hours: Temp:  [97.5 F (36.4 C)] 97.5 F (36.4 C) (11/11 1943) Pulse Rate:  [126-142] 126 (11/11 1942) Resp:  [9-24] 12 (11/11 1942) BP: (150)/(100) 150/100 (11/11 1940) SpO2:  [49 %-100 %] 100 % (11/11 1942) Weight:  [54.4 kg] 54.4 kg (11/11 1946)   Physical Exam  Constitutional: Appears well-developed and well-nourished.  Psych: She is unresponsive Eyes: No scleral injection HENT: She has large bloody bandage on her forehead Head: Normocephalic.  Cardiovascular: Normal rate and regular rhythm.  Respiratory: Effort normal, non-labored breathing GI: Soft.  No distension. There is no tenderness.  Skin: WDI  Neuro: Mental Status: Patient  is obtunded, she does not follow commands. Cranial Nerves: II: She does not blink to threat. Pupils are equal, round, and reactive to light.   III,IV, VI: She has a leftward of midline gaze, but not forcefully deviated V: VII: She blinks to eyelid stimuli Motor: She has no response to noxious stimuli including nailbed pressure in any extremity Sensory: As above  Deep Tendon Reflexes: 2+ and symmetric in the patellae.  No clonus Cerebellar: She does not perform  I have reviewed labs in epic and the results pertinent to this consultation are: Chem-8- creatinine 0.4, sodium 136, potassium 3.5 Lactate 15.3   I have reviewed the images obtained: CT head-perisylvian SAH bilaterally.   Impression: 37 yo F with unwitnessed falls with seizures and extensive traumatic SAH. Unclear how much of her mental  status is due to SAH/TBI versus seizure.  I do think that a EEG is indicated.  Unfortunately, we do not have any past medical history or social history at this time.  Recommendations: 1) Keppra 2 g IV x1 2) stat EEG 3) neurosurgical consult, defer management of TBI/SAH to neurosurgery 4) UDS 5) obtain PMH  Ritta Slot, MD [facility] [phone]  If 7pm- 7am, please page neurology on call as listed in [facility].

## 2018-02-24 NOTE — Procedures (Addendum)
Intubation Procedure Note Chardonnay Holzmann [id number] 1980-05-24  Procedure: Intubation Indications: Airway protection and maintenance  Procedure Details Consent: Unable to obtain consent because of emergent medical necessity. Time Out: Verified patient identification, verified procedure, site/side was marked, verified correct patient position, special equipment/implants available, medications/allergies/relevent history reviewed, required imaging and test results available.  Performed  Maximum sterile technique was used including antiseptics, cap, gloves, gown, hand hygiene and mask.  MAC and 3    Evaluation Hemodynamic Status: BP hypertensive; O2 sats: stable throughout Patient's Current Condition: stable Complications: No apparent complications Patient did tolerate procedure well. Chest X-ray ordered to verify placement.  CXR: pending.   Myrtie Neither 02/24/2018

## 2018-02-24 NOTE — ED Notes (Signed)
BVM with 100% O2 to assist in ventilations

## 2018-02-24 NOTE — ED Notes (Signed)
Neurosurgery and Trauma MDs remain at bedside

## 2018-02-24 NOTE — ED Notes (Signed)
Pts aunt called stating someone from the [facility] called her about the patient being here. Please call and give update on the patient.  [phone] (Caitlyn)

## 2018-02-24 NOTE — ED Provider Notes (Signed)
[facility] EMERGENCY DEPARTMENT Provider Note   CSN: [id number] Arrival date & time: 02/24/18  1934     History   Chief Complaint No chief complaint on file.   HPI Shontell Prosser is a 37 y.o. female.  HPI Patient is a 37 year old white female who was brought in by EMS secondary to an apparent fall while at work.  Patient suffered head trauma as result of the fall and has had multiple seizures since the trauma occurred.  Upon arrival to the emergency department patient was made a level 1 trauma as she was unresponsive and hypoxic.  EMS are unsure whether patient had seizure/syncopal episode which led to a fall or if she fell and struck her head and the resultant trauma caused her to be unresponsive and have seizure activity.  Her seizures were reportedly tonic-clonic activity with leftward eye deviation.  Past medical history is limited at this time as patient is unresponsive.  Level 5 caveat applies given patient's mental status and her acuity of condition.  No past medical history on file.  Patient Active Problem List   Diagnosis Date Noted  . SAH (subarachnoid hemorrhage) (HCC) 02/24/2018   The histories are not reviewed yet. Please review them in the "History" navigator section and refresh this SmartLink.   OB History   None      Home Medications    Prior to Admission medications   Not on File    Family History No family history on file.  Social History Social History   Tobacco Use  . Smoking status: Not on file  Substance Use Topics  . Alcohol use: Not on file  . Drug use: Not on file     Allergies   Patient has no known allergies.   Review of Systems Review of Systems  Unable to perform ROS: Mental status change     Physical Exam Updated Vital Signs BP 102/83   Pulse (!) 131   Temp 99.2 F (37.3 C) (Axillary)   Resp (!) 23   Ht 5\' 1"  (1.549 m)   Wt 54.4 kg   LMP  (LMP Unknown)   SpO2 93%   BMI 22.67 kg/m    Physical Exam  Constitutional: She appears well-developed and well-nourished.  HENT:  Patient initially came in with large dressing overlying her head that was saturated with blood.  After removal of patient's dressing there is a large laceration present on her right frontal scalp.  Eyes:  For gave deviation.  Her pupils initially were minimally reactive.  On repeat evaluation her pupils were reactive.  Neck: Neck supple.  C-collar in place.  Cardiovascular: Regular rhythm. Tachycardia present.  Pulmonary/Chest: She is in respiratory distress.  Very shallow respirations  Abdominal: Soft. She exhibits no distension.  Musculoskeletal: She exhibits no edema.  Small bruise on left knee, otherwise no evidence of traumatic injury.  Neurological:  Patient unresponsive.  Initially with leftward eye deviation.  No purposeful movements during my exam.  No reaction to noxious stimuli.  Skin: Skin is warm and dry.  Psychiatric:  Unable to assess secondary to patient's mental status.  Nursing note and vitals reviewed.    ED Treatments / Results  Labs (all labs ordered are listed, but only abnormal results are displayed) Labs Reviewed  COMPREHENSIVE METABOLIC PANEL - Abnormal; Notable for the following components:      Result Value   Potassium 3.3 (*)    CO2 13 (*)    Glucose, Bld 146 (*)  BUN 5 (*)    Calcium 8.3 (*)    AST 359 (*)    ALT 90 (*)    Alkaline Phosphatase 147 (*)    Anion gap 25 (*)    All other components within normal limits  CBC - Abnormal; Notable for the following components:   WBC 13.7 (*)    RBC 3.16 (*)    Hemoglobin 11.6 (*)    MCV 118.0 (*)    MCH 36.7 (*)    All other components within normal limits  ETHANOL - Abnormal; Notable for the following components:   Alcohol, Ethyl (B) 11 (*)    All other components within normal limits  URINALYSIS, ROUTINE W REFLEX MICROSCOPIC - Abnormal; Notable for the following components:   APPearance HAZY (*)    Hgb  urine dipstick MODERATE (*)    Ketones, ur 80 (*)    Protein, ur 100 (*)    Leukocytes, UA SMALL (*)    Bacteria, UA RARE (*)    All other components within normal limits  RAPID URINE DRUG SCREEN, HOSP PERFORMED - Abnormal; Notable for the following components:   Opiates POSITIVE (*)    All other components within normal limits  CBC - Abnormal; Notable for the following components:   RBC 2.09 (*)    Hemoglobin 7.7 (*)    HCT 24.0 (*)    MCV 114.8 (*)    MCH 36.8 (*)    Platelets 122 (*)    All other components within normal limits  COMPREHENSIVE METABOLIC PANEL - Abnormal; Notable for the following components:   Sodium 134 (*)    CO2 16 (*)    Glucose, Bld 103 (*)    BUN <5 (*)    Calcium 5.8 (*)    Total Protein 5.2 (*)    Albumin 2.6 (*)    AST 182 (*)    ALT 58 (*)    Total Bilirubin 1.5 (*)    All other components within normal limits  I-STAT CHEM 8, ED - Abnormal; Notable for the following components:   BUN 4 (*)    Creatinine, Ser 0.40 (*)    Glucose, Bld 146 (*)    Calcium, Ion 1.02 (*)    TCO2 17 (*)    All other components within normal limits  I-STAT CG4 LACTIC ACID, ED - Abnormal; Notable for the following components:   Lactic Acid, Venous 15.30 (*)    All other components within normal limits  I-STAT ARTERIAL BLOOD GAS, ED - Abnormal; Notable for the following components:   pH, Arterial 7.340 (*)    pO2, Arterial 340.0 (*)    Bicarbonate 18.6 (*)    TCO2 20 (*)    Acid-base deficit 7.0 (*)    All other components within normal limits  MRSA PCR SCREENING  PROTIME-INR  HIV ANTIBODY (ROUTINE TESTING W REFLEX)  PROTIME-INR  APTT  TRIGLYCERIDES  CDS SEROLOGY  CBC  TYPE AND SCREEN  PREPARE FRESH FROZEN PLASMA  ABO/RH  BLOOD PRODUCT ORDER (VERBAL) VERIFICATION  PREPARE RBC (CROSSMATCH)    EKG EKG Interpretation  Date/Time:  Monday February 24 2018 20:18:08 EST Ventricular Rate:  140 PR Interval:    QRS Duration: 86 QT Interval:  331 QTC  Calculation: 506 R Axis:   53 Text Interpretation:  Sinus tachycardia Multiple ventricular premature complexes Aberrant complex Probable LVH with secondary repol abnrm Borderline prolonged QT interval No prio rECG for comparison.  No STEMI Confirmed by Theda Belfast ([id number]) on 02/24/2018  9:29:07 PM   Radiology Ct Head Wo Contrast  Result Date: 02/25/2018 CLINICAL DATA:  Follow-up examination for intracranial hemorrhage. EXAM: CT HEAD WITHOUT CONTRAST TECHNIQUE: Contiguous axial images were obtained from the base of the skull through the vertex without intravenous contrast. COMPARISON:  Prior CT from 02/24/2018. FINDINGS: Brain: Previously seen moderate volume subarachnoid hemorrhage is slightly less conspicuous as compared to previous exam, overall likely slightly decreased. Hemorrhage as re-distributed from the sylvian fissures, now more prominent layering along several cortical sulci at the posterior cerebral hemispheres. Overall, hemorrhages greater within the right cerebral hemisphere as compared to the left. Increased hemorrhage seen layering within the occipital horns of both lateral ventricles, compatible with redistribution. No hydrocephalus or ventricular trapping. Basilar cisterns remain patent. No midline shift. Evolving small cortical contusions at the high anterior frontal lobes bilaterally, slightly increased in size from previous. Largest of these seen on the right and measures 1 cm. No significant mass effect. No other new acute intracranial hemorrhage. No acute large vessel territory infarct. No extra-axial fluid collection. Vascular: No hyperdense vessel. Skull: Evolving right-sided scalp contusion, decreased in size from previous. Calvarium unchanged. Sinuses/Orbits: Globes and orbital soft tissues demonstrate no acute finding. Mild scattered mucosal thickening within the ethmoidal air cells and maxillary sinuses. Paranasal sinuses are otherwise clear. Mastoid air cells remain largely  clear. Other: None. IMPRESSION: 1. Interval decrease in moderate volume subarachnoid hemorrhage as compared to previous exam. Increased intraventricular blood consistent with redistribution. No hydrocephalus or ventricular trapping. 2. Evolving small bifrontal hemorrhagic contusions as above, right slightly larger than left. No significant mass effect. 3. No other new acute intracranial abnormality. 4. Interval decrease in large right right-sided scalp contusion. Electronically Signed   By: Rise Mu M.D.   On: 02/25/2018 05:59   Ct Head Wo Contrast  Result Date: 02/24/2018 CLINICAL DATA:  Female of unknown age found down. Unknown exact trauma mechanism. Seizing, altered mental status. Intubated for airway protection. EXAM: CT HEAD WITHOUT CONTRAST CT MAXILLOFACIAL WITHOUT CONTRAST CT CERVICAL SPINE WITHOUT CONTRAST TECHNIQUE: Multidetector CT imaging of the head, cervical spine, and maxillofacial structures were performed using the standard protocol without intravenous contrast. Multiplanar CT image reconstructions of the cervical spine and maxillofacial structures were also generated. COMPARISON:  Portable chest 1938 hours today. FINDINGS: CT HEAD FINDINGS Brain: Small to moderate volume of subarachnoid hemorrhage along both sylvian fissures and the bilateral superior and lateral convexities. The basilar cisterns are spared. There is slightly more blood along the right sylvian fissure and hemisphere. Superimposed small volume of right lateral intraventricular hemorrhage. No subdural hemorrhage identified. No ventriculomegaly. No intracranial mass effect or midline shift. Basilar cisterns remain normal. Gray-white matter differentiation is within normal limits throughout the brain. No cortically based acute infarct identified. Vascular: No suspicious intracranial vascular hyperdensity. Skull: No skull fracture identified. Other: Large right anterior convexity scalp hematoma up to 2.3 centimeters in  thickness with superimposed small volume scalp soft tissue gas. Other scalp soft tissues are within normal limits. CT MAXILLOFACIAL FINDINGS Osseous: Mandible intact. Maxilla and zygoma intact. Nasal bones within normal limits. No acute facial fracture identified. Orbits: Orbital walls intact. Orbit soft tissues are within normal limits. Sinuses: Well pneumatized. There is fluid or debris in the right external auditory canal, but the right tympanic membrane and mastoids remain clear. Left middle ear and mastoids are clear. Soft tissues: Fluid in the pharynx in the setting of intubation. ET tube and oral enteric tube in place. Negative noncontrast deep soft tissue spaces of the  face. CT CERVICAL SPINE FINDINGS Alignment: Mild straightening of cervical lordosis. Cervicothoracic junction alignment is within normal limits. Bilateral posterior element alignment is within normal limits. Skull base and vertebrae: Visualized skull base is intact. No atlanto-occipital dissociation. No cervical spine fracture. Soft tissues and spinal canal: No prevertebral fluid or swelling. No visible canal hematoma. Endotracheal tube and enteric tube course appropriately into the trachea and upper esophagus. Disc levels:  Minimal degenerative changes. Upper chest: Negative lung apices. Visible upper thoracic levels appear intact. IMPRESSION: 1. Small to moderate volume of presumed post-traumatic Subarachnoid Hemorrhage along both Sylvian fissures and the cerebral convexities, more so on the right. The basilar cisterns are spared. 2. Superimposed small volume right lateral Intraventricular Hemorrhage. No ventriculomegaly. 3. No cerebral hemorrhagic contusion identified. No intracranial mass effect. No acute cortically based infarct. 4. Large right anterior convexity scalp hematoma but no underlying skull fracture identified. 5. The above critical value/emergent results were discussed by telephone with Dr. Andrey Campanile of Trauma Surgery at 2016  hours on 02/24/2018. 6. No face or cervical spine fracture. No other acute traumatic injury identified. Electronically Signed   By: Odessa Fleming M.D.   On: 02/24/2018 20:32   Ct Cervical Spine Wo Contrast  Result Date: 02/24/2018 CLINICAL DATA:  Female of unknown age found down. Unknown exact trauma mechanism. Seizing, altered mental status. Intubated for airway protection. EXAM: CT HEAD WITHOUT CONTRAST CT MAXILLOFACIAL WITHOUT CONTRAST CT CERVICAL SPINE WITHOUT CONTRAST TECHNIQUE: Multidetector CT imaging of the head, cervical spine, and maxillofacial structures were performed using the standard protocol without intravenous contrast. Multiplanar CT image reconstructions of the cervical spine and maxillofacial structures were also generated. COMPARISON:  Portable chest 1938 hours today. FINDINGS: CT HEAD FINDINGS Brain: Small to moderate volume of subarachnoid hemorrhage along both sylvian fissures and the bilateral superior and lateral convexities. The basilar cisterns are spared. There is slightly more blood along the right sylvian fissure and hemisphere. Superimposed small volume of right lateral intraventricular hemorrhage. No subdural hemorrhage identified. No ventriculomegaly. No intracranial mass effect or midline shift. Basilar cisterns remain normal. Gray-white matter differentiation is within normal limits throughout the brain. No cortically based acute infarct identified. Vascular: No suspicious intracranial vascular hyperdensity. Skull: No skull fracture identified. Other: Large right anterior convexity scalp hematoma up to 2.3 centimeters in thickness with superimposed small volume scalp soft tissue gas. Other scalp soft tissues are within normal limits. CT MAXILLOFACIAL FINDINGS Osseous: Mandible intact. Maxilla and zygoma intact. Nasal bones within normal limits. No acute facial fracture identified. Orbits: Orbital walls intact. Orbit soft tissues are within normal limits. Sinuses: Well pneumatized.  There is fluid or debris in the right external auditory canal, but the right tympanic membrane and mastoids remain clear. Left middle ear and mastoids are clear. Soft tissues: Fluid in the pharynx in the setting of intubation. ET tube and oral enteric tube in place. Negative noncontrast deep soft tissue spaces of the face. CT CERVICAL SPINE FINDINGS Alignment: Mild straightening of cervical lordosis. Cervicothoracic junction alignment is within normal limits. Bilateral posterior element alignment is within normal limits. Skull base and vertebrae: Visualized skull base is intact. No atlanto-occipital dissociation. No cervical spine fracture. Soft tissues and spinal canal: No prevertebral fluid or swelling. No visible canal hematoma. Endotracheal tube and enteric tube course appropriately into the trachea and upper esophagus. Disc levels:  Minimal degenerative changes. Upper chest: Negative lung apices. Visible upper thoracic levels appear intact. IMPRESSION: 1. Small to moderate volume of presumed post-traumatic Subarachnoid Hemorrhage along both Sylvian  fissures and the cerebral convexities, more so on the right. The basilar cisterns are spared. 2. Superimposed small volume right lateral Intraventricular Hemorrhage. No ventriculomegaly. 3. No cerebral hemorrhagic contusion identified. No intracranial mass effect. No acute cortically based infarct. 4. Large right anterior convexity scalp hematoma but no underlying skull fracture identified. 5. The above critical value/emergent results were discussed by telephone with Dr. Andrey Campanile of Trauma Surgery at 2016 hours on 02/24/2018. 6. No face or cervical spine fracture. No other acute traumatic injury identified. Electronically Signed   By: Odessa Fleming M.D.   On: 02/24/2018 20:32   Dg Chest Port 1 View  Result Date: 02/25/2018 CLINICAL DATA:  Increased oro pharyngeal secretions EXAM: PORTABLE CHEST 1 VIEW COMPARISON:  02/24/2018 FINDINGS: Endotracheal tube and NG tube  remain in place, unchanged. Patchy right infrahilar airspace opacity is new since prior study concerning for infiltrate/pneumonia. No confluent opacity on the left. Heart is borderline in size. No effusions. IMPRESSION: Increasing right infrahilar airspace opacity concerning for pneumonia. Electronically Signed   By: Charlett Nose M.D.   On: 02/25/2018 08:46   Dg Chest Port 1 View  Result Date: 02/24/2018 CLINICAL DATA:  Seizure with fall. Intubation and OG tube placement. EXAM: PORTABLE CHEST 1 VIEW COMPARISON:  None. FINDINGS: Endotracheal tube terminates at the level of the clavicles. Enteric tube courses into the stomach with tip below the inferior margin of the image. Tubing projecting over the lateral right chest is presumably external to the patient. The cardiomediastinal silhouette is within normal limits. No airspace consolidation, edema, pleural effusion, pneumothorax is identified. No acute osseous abnormality is identified. IMPRESSION: Support devices as above. No evidence of acute airspace disease. Electronically Signed   By: Sebastian Ache M.D.   On: 02/24/2018 20:04   Ct Maxillofacial Wo Contrast  Result Date: 02/24/2018 CLINICAL DATA:  Female of unknown age found down. Unknown exact trauma mechanism. Seizing, altered mental status. Intubated for airway protection. EXAM: CT HEAD WITHOUT CONTRAST CT MAXILLOFACIAL WITHOUT CONTRAST CT CERVICAL SPINE WITHOUT CONTRAST TECHNIQUE: Multidetector CT imaging of the head, cervical spine, and maxillofacial structures were performed using the standard protocol without intravenous contrast. Multiplanar CT image reconstructions of the cervical spine and maxillofacial structures were also generated. COMPARISON:  Portable chest 1938 hours today. FINDINGS: CT HEAD FINDINGS Brain: Small to moderate volume of subarachnoid hemorrhage along both sylvian fissures and the bilateral superior and lateral convexities. The basilar cisterns are spared. There is slightly  more blood along the right sylvian fissure and hemisphere. Superimposed small volume of right lateral intraventricular hemorrhage. No subdural hemorrhage identified. No ventriculomegaly. No intracranial mass effect or midline shift. Basilar cisterns remain normal. Gray-white matter differentiation is within normal limits throughout the brain. No cortically based acute infarct identified. Vascular: No suspicious intracranial vascular hyperdensity. Skull: No skull fracture identified. Other: Large right anterior convexity scalp hematoma up to 2.3 centimeters in thickness with superimposed small volume scalp soft tissue gas. Other scalp soft tissues are within normal limits. CT MAXILLOFACIAL FINDINGS Osseous: Mandible intact. Maxilla and zygoma intact. Nasal bones within normal limits. No acute facial fracture identified. Orbits: Orbital walls intact. Orbit soft tissues are within normal limits. Sinuses: Well pneumatized. There is fluid or debris in the right external auditory canal, but the right tympanic membrane and mastoids remain clear. Left middle ear and mastoids are clear. Soft tissues: Fluid in the pharynx in the setting of intubation. ET tube and oral enteric tube in place. Negative noncontrast deep soft tissue spaces of the face.  CT CERVICAL SPINE FINDINGS Alignment: Mild straightening of cervical lordosis. Cervicothoracic junction alignment is within normal limits. Bilateral posterior element alignment is within normal limits. Skull base and vertebrae: Visualized skull base is intact. No atlanto-occipital dissociation. No cervical spine fracture. Soft tissues and spinal canal: No prevertebral fluid or swelling. No visible canal hematoma. Endotracheal tube and enteric tube course appropriately into the trachea and upper esophagus. Disc levels:  Minimal degenerative changes. Upper chest: Negative lung apices. Visible upper thoracic levels appear intact. IMPRESSION: 1. Small to moderate volume of presumed  post-traumatic Subarachnoid Hemorrhage along both Sylvian fissures and the cerebral convexities, more so on the right. The basilar cisterns are spared. 2. Superimposed small volume right lateral Intraventricular Hemorrhage. No ventriculomegaly. 3. No cerebral hemorrhagic contusion identified. No intracranial mass effect. No acute cortically based infarct. 4. Large right anterior convexity scalp hematoma but no underlying skull fracture identified. 5. The above critical value/emergent results were discussed by telephone with Dr. Andrey Campanile of Trauma Surgery at 2016 hours on 02/24/2018. 6. No face or cervical spine fracture. No other acute traumatic injury identified. Electronically Signed   By: Odessa Fleming M.D.   On: 02/24/2018 20:32    Procedures Procedure Name: Intubation Date/Time: 02/24/2018 7:00 PM Performed by: Keith Rake, MD Pre-anesthesia Checklist: Emergency Drugs available, Patient being monitored and Suction available Oxygen Delivery Method: Ambu bag Preoxygenation: Pre-oxygenation with 100% oxygen Induction Type: Rapid sequence Ventilation: Mask ventilation without difficulty Laryngoscope size: D Blade. Grade View: Grade I Tube size: 7.0 mm Number of attempts: 1 Airway Equipment and Method: Rigid stylet and Video-laryngoscopy Placement Confirmation: ETT inserted through vocal cords under direct vision,  Positive ETCO2,  CO2 detector and Breath sounds checked- equal and bilateral Secured at: 22 cm Tube secured with: ETT holder      (including critical care time)  Medications Ordered in ED Medications  0.9 % NaCl with KCl 20 mEq/ L  infusion ( Intravenous Rate/Dose Verify 02/25/18 1000)  fentaNYL (SUBLIMAZE) injection 50 mcg (50 mcg Intravenous Not Given 02/24/18 2314)  levETIRAcetam (KEPPRA) IVPB 1000 mg/100 mL premix ( Intravenous Stopped 02/25/18 0834)  phenytoin (DILANTIN) injection 100 mg (100 mg Intravenous Given 02/25/18 1410)  calcium gluconate 1 g/ 50 mL sodium  chloride IVPB (has no administration in time range)  metoprolol tartrate (LOPRESSOR) injection 2.5 mg (2.5 mg Intravenous Given 02/25/18 0904)  LORazepam (ATIVAN) tablet 1 mg ( Oral See Alternative 02/25/18 1055)    Or  LORazepam (ATIVAN) injection 1 mg (1 mg Intravenous Given 02/25/18 1055)  thiamine (VITAMIN B-1) tablet 100 mg ( Oral See Alternative 02/25/18 1409)    Or  thiamine (B-1) injection 100 mg (100 mg Intravenous Given 02/25/18 1409)  folic acid (FOLVITE) tablet 1 mg (1 mg Oral Not Given 02/25/18 1257)  multivitamin with minerals tablet 1 tablet (1 tablet Oral Not Given 02/25/18 1258)  LORazepam (ATIVAN) injection 0-4 mg (0 mg Intravenous Not Given 02/25/18 1206)    Followed by  LORazepam (ATIVAN) injection 0-4 mg (has no administration in time range)  lacosamide (VIMPAT) 100 mg in sodium chloride 0.9 % 25 mL IVPB (100 mg Intravenous New Bag/Given 02/25/18 1405)  LORazepam (ATIVAN) injection ( Intravenous Canceled Entry 02/24/18 2000)  etomidate (AMIDATE) injection (20 mg Intravenous Given 02/24/18 1942)  rocuronium (ZEMURON) injection (50 mg Intravenous Given 02/24/18 1942)  levETIRAcetam (KEPPRA) IVPB 1000 mg/100 mL premix (0 mg Intravenous Stopped 02/24/18 2010)  fentaNYL (SUBLIMAZE) injection ( Intravenous Canceled Entry 02/24/18 2000)  propofol (DIPRIVAN) 1000 MG/100ML infusion (0  mcg/kg/min  54.4 kg Intravenous Stopping Infusion hung by another clincian 02/24/18 2300)  LORazepam (ATIVAN) injection (2 mg Intravenous Given 02/24/18 1957)  0.9 %  sodium chloride infusion ( Intravenous Stopping Infusion hung by another clincian 02/24/18 2300)  levETIRAcetam (KEPPRA) IVPB 1000 mg/100 mL premix (0 mg Intravenous Stopped 02/24/18 2054)  fentaNYL (SUBLIMAZE) injection ( Intravenous Canceled Entry 02/24/18 2045)  fosPHENYtoin (CEREBYX) 1,000 mg PE in sodium chloride 0.9 % 50 mL IVPB ( Intravenous Stopped 02/25/18 0126)  lacosamide (VIMPAT) 200 mg in sodium chloride 0.9 % 25 mL IVPB  ( Intravenous Stopped 02/25/18 0622)  albumin human 25 % solution 12.5 g ( Intravenous Rate/Dose Verify 02/25/18 1000)  0.9 %  sodium chloride infusion (Manually program via Guardrails IV Fluids) ( Intravenous New Bag/Given 02/25/18 1252)     Initial Impression / Assessment and Plan / ED Course  I have reviewed the triage vital signs and the nursing notes.  Pertinent labs & imaging results that were available during my care of the patient were reviewed by me and considered in my medical decision making (see chart for details).  Patient is a 37 year old female with past medical history as detailed above who presents the emergency department for evaluation of head trauma and seizures.  At time of arrival patient was unresponsive and had evidence of a traumatic injury to her frontal scalp.  Patient with behavior turning for seizure activity as she has leftward eye deviation and some increased tone per EMS reports.  She was given Ativan and a Keppra load was given through her IV.  As detailed above, patient was intubated for airway protection and hypoxia.  She had scalp laceration was repaired at bedside by surgery.  CT of the patient's head show evidence of adenoid and intraventricular hemorrhage.  As a result neurosurgery was consulted to evaluate the patient.  They do not believe that emergent neurosurgical intervention is necessary at this time.  As a result patient will be admitted to the trauma service for further evaluation and care.  For further information regarding patient's continued hospital course please see admitting team documentation.  The care of this patient was discussed with my attending physician Dr. Rush Landmark, who voices agreement with work-up and ED disposition.    Final Clinical Impressions(s) / ED Diagnoses   Final diagnoses:  Increased oropharyngeal secretions    ED Discharge Orders    None       Toma Arts, Winfield Rast, MD 02/25/18 1449    Tegeler, Canary Brim,  MD 02/26/18 1215

## 2018-02-24 NOTE — Progress Notes (Signed)
*   No surgery found *  9:14 PM  PATIENT:  Alyssa Little  37 y.o. female  PRE-OPERATIVE DIAGNOSIS: Right forehead complex lacerations x 2  POST-OPERATIVE DIAGNOSIS: same  PROCEDURE:  Closure of Right forehead laceration - 3 in 2 Layer closure of stellate Right forehead laceration - 3in, ligation of branch of Right temporal artery  SURGEON:  Atilano Ina MD  ASSISTANTS: none   ANESTHESIA:   fentanyl/propofol   DRAINS: none   LOCAL MEDICATIONS USED:  NONE  SPECIMEN:  No Specimen  DISPOSITION OF SPECIMEN:  N/A  COUNTS:    INDICATION FOR PROCEDURE: 37 year old female brought in as a level 1 trauma alert after sustaining head trauma.  She had 2 lacerations to her right forehead.  One was stellate and complex with active pulsatile bleeding.  The more anterior laceration was linear and not actively bleeding.  PROCEDURE: The procedure was performed at the bedside due to the active bleeding.  The wound was irrigated with Betadine saline.  The wound was explored.  There is active pulsatile bleeding from the more posterior wound.  It appeared to involve a branch of the right temporal artery.  I decided to ligate the area of pulsatile bleeding.  It required 3 figure-of-eight 2-0 Vicryl sutures to obtain hemostasis.  We then irrigated the wounds again.  At this point the patient was prepped and draped.  The more anterior laceration was linear and the skin edges were viable.  This wound was reapproximated with multiple interrupted 4-0 Prolene sutures.  The more posterior wound which was stellate which required to the ligation of the branch of the temporal artery had more stellate skin edges.  I loosely reapproximated this wound with 3 interrupted 3-0 nylon sutures.  There was good hemostasis.  Bandage was placed.  No immediate complications.  PLAN OF CARE: Admit to inpatient   PATIENT DISPOSITION:  ICU - intubated and hemodynamically stable.   Delay start of Pharmacological VTE agent  (>24hrs) due to surgical blood loss or risk of bleeding:  yes  Mary Sella. Andrey Campanile, MD, FACS General, Bariatric, & Minimally Invasive Surgery [facility], [place]

## 2018-02-25 ENCOUNTER — Inpatient Hospital Stay (HOSPITAL_COMMUNITY): Payer: Managed Care, Other (non HMO)

## 2018-02-25 DIAGNOSIS — G9340 Encephalopathy, unspecified: Secondary | ICD-10-CM

## 2018-02-25 LAB — CBC
HEMATOCRIT: 24 % — AB (ref 36.0–46.0)
HEMOGLOBIN: 7.7 g/dL — AB (ref 12.0–15.0)
MCH: 36.8 pg — ABNORMAL HIGH (ref 26.0–34.0)
MCHC: 32.1 g/dL (ref 30.0–36.0)
MCV: 114.8 fL — ABNORMAL HIGH (ref 80.0–100.0)
Platelets: 122 10*3/uL — ABNORMAL LOW (ref 150–400)
RBC: 2.09 MIL/uL — AB (ref 3.87–5.11)
RDW: 13.5 % (ref 11.5–15.5)
WBC: 8.6 10*3/uL (ref 4.0–10.5)
nRBC: 0 % (ref 0.0–0.2)

## 2018-02-25 LAB — COMPREHENSIVE METABOLIC PANEL
ALBUMIN: 2.6 g/dL — AB (ref 3.5–5.0)
ALK PHOS: 92 U/L (ref 38–126)
ALT: 58 U/L — AB (ref 0–44)
ANION GAP: 12 (ref 5–15)
AST: 182 U/L — ABNORMAL HIGH (ref 15–41)
BILIRUBIN TOTAL: 1.5 mg/dL — AB (ref 0.3–1.2)
CALCIUM: 5.8 mg/dL — AB (ref 8.9–10.3)
CO2: 16 mmol/L — ABNORMAL LOW (ref 22–32)
CREATININE: 0.53 mg/dL (ref 0.44–1.00)
Chloride: 106 mmol/L (ref 98–111)
GFR calc Af Amer: >60 mL/min (ref >60)
GFR calc non Af Amer: >60 mL/min (ref >60)
GLUCOSE: 103 mg/dL — AB (ref 70–99)
Potassium: 4.3 mmol/L (ref 3.5–5.1)
SODIUM: 134 mmol/L — AB (ref 135–145)
Total Protein: 5.2 g/dL — ABNORMAL LOW (ref 6.5–8.1)

## 2018-02-25 LAB — APTT: APTT: 30 s (ref 24–36)

## 2018-02-25 LAB — MRSA PCR SCREENING: MRSA by PCR: NEGATIVE

## 2018-02-25 LAB — BLOOD PRODUCT ORDER (VERBAL) VERIFICATION

## 2018-02-25 LAB — HIV ANTIBODY (ROUTINE TESTING W REFLEX): HIV Screen 4th Generation wRfx: NONREACTIVE

## 2018-02-25 LAB — PREPARE RBC (CROSSMATCH)

## 2018-02-25 LAB — PROTIME-INR
INR: 1.13
Prothrombin Time: 14.4 seconds (ref 11.4–15.2)

## 2018-02-25 LAB — PHENYTOIN LEVEL, TOTAL: PHENYTOIN LVL: 18.4 ug/mL (ref 10.0–20.0)

## 2018-02-25 MED ORDER — LEVETIRACETAM IN NACL 1000 MG/100ML IV SOLN
1000.0000 mg | Freq: Once | INTRAVENOUS | Status: AC
  Administered 2018-02-25: 1000 mg via INTRAVENOUS
  Filled 2018-02-25: qty 100

## 2018-02-25 MED ORDER — LORAZEPAM 2 MG/ML IJ SOLN
0.0000 mg | Freq: Four times a day (QID) | INTRAMUSCULAR | Status: AC
  Administered 2018-02-25 – 2018-02-26 (×2): 2 mg via INTRAVENOUS
  Administered 2018-02-26: 1 mg via INTRAVENOUS
  Administered 2018-02-26: 2 mg via INTRAVENOUS
  Filled 2018-02-25 (×2): qty 1

## 2018-02-25 MED ORDER — LORAZEPAM 1 MG PO TABS: 1.0000 mg | ORAL_TABLET | Freq: Four times a day (QID) | ORAL | Status: DC | PRN

## 2018-02-25 MED ORDER — SODIUM CHLORIDE 0.9 % IV SOLN
100.0000 mg | Freq: Two times a day (BID) | INTRAVENOUS | Status: DC
  Administered 2018-02-25 (×2): 100 mg via INTRAVENOUS
  Filled 2018-02-25 (×3): qty 10

## 2018-02-25 MED ORDER — SODIUM CHLORIDE 0.9 % IV SOLN
1000.0000 mg | INTRAVENOUS | Status: AC
  Administered 2018-02-25: 1000 mg via INTRAVENOUS
  Filled 2018-02-25: qty 20

## 2018-02-25 MED ORDER — CALCIUM GLUCONATE-NACL 1-0.675 GM/50ML-% IV SOLN
1.0000 g | Freq: Once | INTRAVENOUS | Status: AC
  Administered 2018-02-25: 1000 mg via INTRAVENOUS
  Filled 2018-02-25: qty 50

## 2018-02-25 MED ORDER — SODIUM CHLORIDE 0.9% IV SOLUTION
Freq: Once | INTRAVENOUS | Status: AC
  Administered 2018-02-25: 13:00:00 via INTRAVENOUS

## 2018-02-25 MED ORDER — METOPROLOL TARTRATE 5 MG/5ML IV SOLN
5.0000 mg | Freq: Four times a day (QID) | INTRAVENOUS | Status: DC | PRN
  Administered 2018-02-25: 5 mg via INTRAVENOUS
  Filled 2018-02-25: qty 5

## 2018-02-25 MED ORDER — LORAZEPAM 2 MG/ML IJ SOLN
1.0000 mg | Freq: Four times a day (QID) | INTRAMUSCULAR | Status: DC | PRN
  Administered 2018-02-25: 1 mg via INTRAVENOUS
  Filled 2018-02-25 (×2): qty 1

## 2018-02-25 MED ORDER — PHENYTOIN SODIUM 50 MG/ML IJ SOLN
100.0000 mg | Freq: Three times a day (TID) | INTRAMUSCULAR | Status: DC
  Administered 2018-02-25 – 2018-03-01 (×12): 100 mg via INTRAVENOUS
  Filled 2018-02-25 (×12): qty 2

## 2018-02-25 MED ORDER — ADULT MULTIVITAMIN W/MINERALS CH
1.0000 | ORAL_TABLET | Freq: Every day | ORAL | Status: DC
  Administered 2018-02-27 – 2018-02-28 (×2): 1 via ORAL
  Filled 2018-02-25 (×2): qty 1

## 2018-02-25 MED ORDER — FENTANYL BOLUS VIA INFUSION
50.0000 ug | INTRAVENOUS | Status: DC | PRN
  Filled 2018-02-25: qty 50

## 2018-02-25 MED ORDER — SODIUM CHLORIDE 0.9 % IV SOLN
200.0000 mg | INTRAVENOUS | Status: AC
  Administered 2018-02-25: 200 mg via INTRAVENOUS
  Filled 2018-02-25: qty 20

## 2018-02-25 MED ORDER — ALBUMIN HUMAN 25 % IV SOLN
12.5000 g | Freq: Once | INTRAVENOUS | Status: AC
  Administered 2018-02-25: 12.5 g via INTRAVENOUS
  Filled 2018-02-25: qty 50

## 2018-02-25 MED ORDER — VITAMIN B-1 100 MG PO TABS
100.0000 mg | ORAL_TABLET | Freq: Every day | ORAL | Status: DC
  Administered 2018-02-27: 100 mg via ORAL
  Filled 2018-02-25: qty 1

## 2018-02-25 MED ORDER — FOLIC ACID 1 MG PO TABS
1.0000 mg | ORAL_TABLET | Freq: Every day | ORAL | Status: DC
  Administered 2018-02-27 – 2018-02-28 (×2): 1 mg via ORAL
  Filled 2018-02-25 (×2): qty 1

## 2018-02-25 MED ORDER — VALPROATE SODIUM 500 MG/5ML IV SOLN
1000.0000 mg | Freq: Once | INTRAVENOUS | Status: AC
  Administered 2018-02-26: 1000 mg via INTRAVENOUS
  Filled 2018-02-25: qty 10

## 2018-02-25 MED ORDER — METOPROLOL TARTRATE 5 MG/5ML IV SOLN
2.5000 mg | Freq: Four times a day (QID) | INTRAVENOUS | Status: DC | PRN
  Administered 2018-02-25 – 2018-02-26 (×3): 2.5 mg via INTRAVENOUS
  Filled 2018-02-25 (×3): qty 5

## 2018-02-25 MED ORDER — LORAZEPAM 2 MG/ML IJ SOLN
0.0000 mg | Freq: Two times a day (BID) | INTRAMUSCULAR | Status: AC
  Administered 2018-02-28: 2 mg via INTRAVENOUS
  Filled 2018-02-25: qty 1

## 2018-02-25 MED ORDER — LEVETIRACETAM IN NACL 1000 MG/100ML IV SOLN
1000.0000 mg | Freq: Two times a day (BID) | INTRAVENOUS | Status: DC
  Administered 2018-02-25 (×2): 1000 mg via INTRAVENOUS
  Filled 2018-02-25 (×2): qty 100

## 2018-02-25 MED ORDER — THIAMINE HCL 100 MG/ML IJ SOLN
100.0000 mg | Freq: Every day | INTRAMUSCULAR | Status: DC
  Administered 2018-02-25 – 2018-02-28 (×3): 100 mg via INTRAVENOUS
  Filled 2018-02-25 (×3): qty 2

## 2018-02-25 MED ORDER — SODIUM CHLORIDE 0.9 % IV SOLN
1000.0000 mg | Freq: Once | INTRAVENOUS | Status: DC
  Filled 2018-02-25: qty 20

## 2018-02-25 MED ORDER — ACETAMINOPHEN 650 MG RE SUPP
650.0000 mg | RECTAL | Status: DC | PRN
  Administered 2018-02-25 – 2018-03-02 (×2): 650 mg via RECTAL
  Filled 2018-02-25 (×2): qty 1

## 2018-02-25 MED ORDER — LORAZEPAM 1 MG PO TABS
1.0000 mg | ORAL_TABLET | Freq: Four times a day (QID) | ORAL | Status: DC | PRN
  Administered 2018-03-03 – 2018-03-05 (×4): 1 mg via ORAL
  Filled 2018-02-25 (×4): qty 1

## 2018-02-25 MED ORDER — LORAZEPAM 2 MG/ML IJ SOLN
1.0000 mg | INTRAMUSCULAR | Status: DC | PRN
  Administered 2018-02-25 – 2018-03-04 (×16): 1 mg via INTRAVENOUS
  Filled 2018-02-25 (×16): qty 1

## 2018-02-25 NOTE — Progress Notes (Signed)
Spoke with on call Trauma MD regarding tachypnea, agitation, and tachycardia.  Orders given to change frequency of Ativan administration to q3h.  Spoke with on call neuro MD regarding fever.  Orders given [facility]lenol suppository. [facility]

## 2018-02-25 NOTE — Progress Notes (Signed)
Pt transported to and from CT uneventful trip. Pt transported on 100% FiO2, no complications noted.

## 2018-02-25 NOTE — Progress Notes (Addendum)
Patient ID: Alyssa LacyChristina Danielle Little, female   DOB: 02/10/1981, 37 y.o.   MRN: [id number] Follow up - Trauma Critical Care  Patient Details:    Alyssa LacyChristina Danielle Little is an 37 y.o. female.  Lines/tubes : Airway 7 mm (Active)  Secured at (cm) 22 cm 02/25/2018  8:32 AM  Measured From Lips 02/25/2018  8:32 AM  Secured Location Right 02/25/2018  8:32 AM  Secured By [employer] Tube Holder 02/25/2018  8:32 AM  Tube Holder Repositioned Yes 02/25/2018  8:32 AM  Cuff Pressure (cm H2O) 30 cm H2O 02/25/2018  8:32 AM  Site Condition Dry 02/25/2018  8:32 AM     NG/OG Tube Orogastric 16 Fr. Xray;Aucultation (Active)  Site Assessment Clean;Dry;Intact 02/24/2018 10:30 PM  Ongoing Placement Verification No change in cm markings or external length of tube from initial placement;No change in respiratory status;No acute changes, not attributed to clinical condition 02/24/2018 10:30 PM  Status Clamped 02/24/2018 10:30 PM  Drainage Appearance Bile 02/24/2018  7:48 PM     Urethral Catheter Daisy BlossomSara W. RN Temperature probe 14 Fr. (Active)  Indication for Insertion or Continuance of Catheter Unstable critical patients (first 24-48 hours) 02/25/2018  8:00 AM  Site Assessment Clean;Intact;Dry 02/24/2018 10:30 PM  Catheter Maintenance Bag below level of bladder;Catheter secured;Drainage bag/tubing not touching floor;Insertion date on drainage bag;No dependent loops;Seal intact 02/25/2018  8:00 AM  Collection Container Standard drainage bag 02/24/2018 10:30 PM  Securement Method Securing device (Describe) 02/24/2018 10:30 PM  Urinary Catheter Interventions Unclamped 02/24/2018 10:30 PM  Output (mL) 550 mL 02/25/2018  6:00 AM    Microbiology/Sepsis markers: Results for orders placed or performed during the hospital encounter of 02/24/18  MRSA PCR Screening     Status: None   Collection Time: 02/24/18 10:20 PM  Result Value Ref Range Status   MRSA by PCR NEGATIVE NEGATIVE Final    Comment:        The GeneXpert  MRSA Assay (FDA approved for NASAL specimens only), is one component of a comprehensive MRSA colonization surveillance program. It is not intended to diagnose MRSA infection nor to guide or monitor treatment for MRSA infections. Performed at [facility], [address] [place], [place] [address]     Anti-infectives:  Anti-infectives (From admission, onward)   None      Best Practice/Protocols:  VTE Prophylaxis: Mechanical Continous Sedation  Consults: Treatment Team:  Kym Groomriadhosp, Neuro1, MD Tressie StalkerJenkins, Jeffrey, MD    Studies:    Events:  Subjective:    Overnight Issues:   Objective:  Vital signs for last 24 hours: Temp:  [97.5 F (36.4 C)-99.1 F (37.3 C)] 98.5 F (36.9 C) (11/12 0400) Pulse Rate:  [108-159] 123 (11/12 0700) Resp:  [9-24] 16 (11/12 0700) BP: (81-150)/(64-106) 82/64 (11/12 0700) SpO2:  [49 %-100 %] 97 % (11/12 0832) FiO2 (%):  [40 %-100 %] 40 % (11/12 0832) Weight:  [54.4 kg] 54.4 kg (11/11 1946)  Hemodynamic parameters for last 24 hours:    Intake/Output from previous day: 11/11 0701 - 11/12 0700 In: 2752.2 [I.V.:2460.5; IV Piggyback:291.7] Out: 850 [Urine:850]  Intake/Output this shift: No intake/output data recorded.  Vent settings for last 24 hours: Vent Mode: PSV FiO2 (%):  [40 %-100 %] 40 % Set Rate:  [16 bmp] 16 bmp Vt Set:  [380 mL] 380 mL PEEP:  [5 cmH20] 5 cmH20 Pressure Support:  [5 cmH20] 5 cmH20 Plateau Pressure:  [13 cmH20] 13 cmH20  Physical Exam:  General: scalp lac Neuro: MAE, F/C, agitated HEENT/Neck:  ETT and collar Resp: clear to auscultation bilaterally CVS: tachy 140 GI: soft, nontender, BS WNL, no r/g Extremities: no edema, no erythema, pulses WNL  Results for orders placed or performed during the hospital encounter of 02/24/18 (from the past 24 hour(s))  Type and screen Ordered by PROVIDER DEFAULT     Status: None   Collection Time: 02/24/18  7:40 PM  Result Value Ref Range   ABO/RH(D) O POS     Antibody Screen NEG    Sample Expiration 02/27/2018    Unit Number [id number]    Blood Component Type RED CELLS,LR    Unit division 00    Status of Unit REL FROM [facility]    Unit tag comment EMERGENCY RELEASE    Transfusion Status OK TO TRANSFUSE    Crossmatch Result      NOT NEEDED Performed at [facility], [address], [place], [place] [address]    Unit Number [id number]    Blood Component Type RED CELLS,LR    Unit division 00    Status of Unit REL FROM [facility]    Unit tag comment EMERGENCY RELEASE    Transfusion Status OK TO TRANSFUSE    Crossmatch Result NOT NEEDED   Prepare fresh frozen plasma     Status: None   Collection Time: 02/24/18  7:40 PM  Result Value Ref Range   Unit Number [id number]    Blood Component Type THW PLS APHR    Unit division A0    Status of Unit REL FROM [facility]    Unit tag comment EMERGENCY RELEASE    Transfusion Status OK TO TRANSFUSE    Unit Number [id number]    Blood Component Type THW PLS APHR    Unit division B0    Status of Unit REL FROM [facility]    Unit tag comment EMERGENCY RELEASE    Transfusion Status      OK TO TRANSFUSE Performed at [facility], [address], [place], [place] [address]   Comprehensive metabolic panel     Status: Abnormal   Collection Time: 02/24/18  7:40 PM  Result Value Ref Range   Sodium 137 135 - 145 mmol/L   Potassium 3.3 (L) 3.5 - 5.1 mmol/L   Chloride 99 98 - 111 mmol/L   CO2 13 (L) 22 - 32 mmol/L   Glucose, Bld 146 (H) 70 - 99 mg/dL   BUN 5 (L) 6 - 20 mg/dL   Creatinine, Ser 4.74 0.44 - 1.00 mg/dL   Calcium 8.3 (L) 8.9 - 10.3 mg/dL   Total Protein 7.2 6.5 - 8.1 g/dL   Albumin 3.8 3.5 - 5.0 g/dL   AST 259 (H) 15 - 41 U/L   ALT 90 (H) 0 - 44 U/L   Alkaline Phosphatase 147 (H) 38 - 126 U/L   Total Bilirubin 0.9 0.3 - 1.2 mg/dL   GFR calc non Af Amer >60 >60 mL/min   GFR calc Af Amer >60 >60 mL/min   Anion gap 25 (H) 5 - 15  CBC     Status: Abnormal   Collection Time:  02/24/18  7:40 PM  Result Value Ref Range   WBC 13.7 (H) 4.0 - 10.5 K/uL   RBC 3.16 (L) 3.87 - 5.11 MIL/uL   Hemoglobin 11.6 (L) 12.0 - 15.0 g/dL   HCT 56.3 87.5 - 64.3 %   MCV 118.0 (H) 80.0 - 100.0 fL   MCH 36.7 (H) 26.0 - 34.0 pg   MCHC 31.1  30.0 - 36.0 g/dL   RDW 16.1 09.6 - 04.5 %   Platelets 177 150 - 400 K/uL   nRBC 0.0 0.0 - 0.2 %  Ethanol     Status: Abnormal   Collection Time: 02/24/18  7:40 PM  Result Value Ref Range   Alcohol, Ethyl (B) 11 (H) <10 mg/dL  Protime-INR     Status: None   Collection Time: 02/24/18  7:40 PM  Result Value Ref Range   Prothrombin Time 13.9 11.4 - 15.2 seconds   INR 1.08   ABO/Rh     Status: None   Collection Time: 02/24/18  7:40 PM  Result Value Ref Range   ABO/RH(D)      O POS Performed at [facility], [address], [place], [place] [address]   I-Stat Chem 8, ED     Status: Abnormal   Collection Time: 02/24/18  7:46 PM  Result Value Ref Range   Sodium 136 135 - 145 mmol/L   Potassium 3.5 3.5 - 5.1 mmol/L   Chloride 102 98 - 111 mmol/L   BUN 4 (L) 6 - 20 mg/dL   Creatinine, Ser 1.91 (L) 0.44 - 1.00 mg/dL   Glucose, Bld 478 (H) 70 - 99 mg/dL   Calcium, Ion 2.95 (L) 1.15 - 1.40 mmol/L   TCO2 17 (L) 22 - 32 mmol/L   Hemoglobin 13.3 12.0 - 15.0 g/dL   HCT 62.1 30.8 - 65.7 %  I-Stat CG4 Lactic Acid, ED     Status: Abnormal   Collection Time: 02/24/18  7:47 PM  Result Value Ref Range   Lactic Acid, Venous 15.30 (HH) 0.5 - 1.9 mmol/L   Comment NOTIFIED PHYSICIAN   I-Stat arterial blood gas, ED     Status: Abnormal   Collection Time: 02/24/18  8:38 PM  Result Value Ref Range   pH, Arterial 7.340 (L) 7.350 - 7.450   pCO2 arterial 34.5 32.0 - 48.0 mmHg   pO2, Arterial 340.0 (H) 83.0 - 108.0 mmHg   Bicarbonate 18.6 (L) 20.0 - 28.0 mmol/L   TCO2 20 (L) 22 - 32 mmol/L   O2 Saturation 100.0 %   Acid-base deficit 7.0 (H) 0.0 - 2.0 mmol/L   Patient temperature 36.7 C    Collection site RADIAL, ALLEN'S TEST ACCEPTABLE    Drawn  by Operator    Sample type ARTERIAL   Urinalysis, Routine w reflex microscopic     Status: Abnormal   Collection Time: 02/24/18  8:46 PM  Result Value Ref Range   Color, Urine YELLOW YELLOW   APPearance HAZY (A) CLEAR   Specific Gravity, Urine 1.017 1.005 - 1.030   pH 5.0 5.0 - 8.0   Glucose, UA NEGATIVE NEGATIVE mg/dL   Hgb urine dipstick MODERATE (A) NEGATIVE   Bilirubin Urine NEGATIVE NEGATIVE   Ketones, ur 80 (A) NEGATIVE mg/dL   Protein, ur 846 (A) NEGATIVE mg/dL   Nitrite NEGATIVE NEGATIVE   Leukocytes, UA SMALL (A) NEGATIVE   RBC / HPF 0-5 0 - 5 RBC/hpf   WBC, UA 21-50 0 - 5 WBC/hpf   Bacteria, UA RARE (A) NONE SEEN   Squamous Epithelial / LPF 0-5 0 - 5   Mucus PRESENT    Hyaline Casts, UA PRESENT   Urine rapid drug screen (hosp performed)     Status: Abnormal   Collection Time: 02/24/18  8:46 PM  Result Value Ref Range   Opiates POSITIVE (A) NONE DETECTED   Cocaine NONE DETECTED NONE DETECTED  Benzodiazepines NONE DETECTED NONE DETECTED   Amphetamines NONE DETECTED NONE DETECTED   Tetrahydrocannabinol NONE DETECTED NONE DETECTED   Barbiturates NONE DETECTED NONE DETECTED  MRSA PCR Screening     Status: None   Collection Time: 02/24/18 10:20 PM  Result Value Ref Range   MRSA by PCR NEGATIVE NEGATIVE  Triglycerides     Status: None   Collection Time: 02/24/18 10:46 PM  Result Value Ref Range   Triglycerides 148 <150 mg/dL  CBC     Status: Abnormal   Collection Time: 02/25/18  4:20 AM  Result Value Ref Range   WBC 8.6 4.0 - 10.5 K/uL   RBC 2.09 (L) 3.87 - 5.11 MIL/uL   Hemoglobin 7.7 (L) 12.0 - 15.0 g/dL   HCT 98.1 (L) 19.1 - 47.8 %   MCV 114.8 (H) 80.0 - 100.0 fL   MCH 36.8 (H) 26.0 - 34.0 pg   MCHC 32.1 30.0 - 36.0 g/dL   RDW 29.5 62.1 - 30.8 %   Platelets 122 (L) 150 - 400 K/uL   nRBC 0.0 0.0 - 0.2 %  Comprehensive metabolic panel     Status: Abnormal   Collection Time: 02/25/18  4:20 AM  Result Value Ref Range   Sodium 134 (L) 135 - 145 mmol/L    Potassium 4.3 3.5 - 5.1 mmol/L   Chloride 106 98 - 111 mmol/L   CO2 16 (L) 22 - 32 mmol/L   Glucose, Bld 103 (H) 70 - 99 mg/dL   BUN <5 (L) 6 - 20 mg/dL   Creatinine, Ser 6.57 0.44 - 1.00 mg/dL   Calcium 5.8 (LL) 8.9 - 10.3 mg/dL   Total Protein 5.2 (L) 6.5 - 8.1 g/dL   Albumin 2.6 (L) 3.5 - 5.0 g/dL   AST 846 (H) 15 - 41 U/L   ALT 58 (H) 0 - 44 U/L   Alkaline Phosphatase 92 38 - 126 U/L   Total Bilirubin 1.5 (H) 0.3 - 1.2 mg/dL   GFR calc non Af Amer >60 >60 mL/min   GFR calc Af Amer >60 >60 mL/min   Anion gap 12 5 - 15  Protime-INR     Status: None   Collection Time: 02/25/18  4:20 AM  Result Value Ref Range   Prothrombin Time 14.4 11.4 - 15.2 seconds   INR 1.13   APTT     Status: None   Collection Time: 02/25/18  4:20 AM  Result Value Ref Range   aPTT 30 24 - 36 seconds    Assessment & Plan: Present on Admission: **None**    LOS: 1 day   Additional comments:I reviewed the patient's new clinical lab test results. and CT head Fall TBI/SAH/B F ICC/IVH - exam improved, Dr. Lovell Sheehan following, TBI team therapies Seizure likely due to above - Keppra, continuous EEG, neurology following Complex scalp lac - S/P closure 11/11 by Dr. Andrey Campanile Acute hypoxic ventilator dependent respiratory failure - weaning well, extubate now ABL anemia - lost a lot of blood from scalp lac. CBC this PM CV - tachycardia, albumin bolus, Lopressor, may need TF Alcohol abuse disorder - friend reports she drinks "2 cups" brandy daily, start CIWA FEN - bedside swallow eval VTE - PAS Dispo - ICU I spoke with her friend. She has an aunt who is coming to town. Critical Care Total Time*: 45 Minutes  Violeta Gelinas, MD, MPH, [facility] Trauma: [phone] General Surgery: [phone]  02/25/2018  *Care during the described time interval was provided by me. I have  reviewed this patient's available data, including medical history, events of note, physical examination and test results as part of my  evaluation.

## 2018-02-25 NOTE — Progress Notes (Signed)
Subjective: The patient is somnolent but easily arousable.  I am told her aunt is coming in from [place] today.  Objective: Vital signs in last 24 hours: Temp:  [97.5 F (36.4 C)-99.1 F (37.3 C)] 98.5 F (36.9 C) (11/12 0400) Pulse Rate:  [108-159] 128 (11/12 0600) Resp:  [9-24] 22 (11/12 0600) BP: (81-150)/(64-106) 89/68 (11/12 0600) SpO2:  [49 %-100 %] 99 % (11/12 0600) FiO2 (%):  [40 %-100 %] 40 % (11/12 0307) Weight:  [54.4 kg] 54.4 kg (11/11 1946) Estimated body mass index is 22.67 kg/m as calculated from the following:   Height as of this encounter: 5\' 1"  (1.549 m).   Weight as of this encounter: 54.4 kg.   Intake/Output from previous day: 11/11 0701 - 11/12 0700 In: 2608.9 [I.V.:2350.7; IV Piggyback:258.2] Out: 850 [Urine:850] Intake/Output this shift: No intake/output data recorded.  Physical exam Glasgow Coma Scale 10 intubated, [id number].  The patient's pupils are equal.  She has conjugate gaze.  Moves all 4 extremities.  She follows commands by holding up fingers.  I reviewed the patient's follow-up head CT performed today at [facility].  He has been some redistribution of her subarachnoid hemorrhage and intraventricular hemorrhage.  She has small bifrontal contusions.  There is no significant mass-effect or hydrocephalus.  Lab Results: Recent Labs    02/24/18 1940 02/24/18 1946 02/25/18 0420  WBC 13.7*  --  8.6  HGB 11.6* 13.3 7.7*  HCT 37.3 39.0 24.0*  PLT 177  --  122*   BMET Recent Labs    02/24/18 1940 02/24/18 1946 02/25/18 0420  NA 137 136 134*  K 3.3* 3.5 4.3  CL 99 102 106  CO2 13*  --  16*  GLUCOSE 146* 146* 103*  BUN 5* 4* <5*  CREATININE 0.66 0.40* 0.53  CALCIUM 8.3*  --  5.8*    Studies/Results: Ct Head Wo Contrast  Result Date: 02/25/2018 CLINICAL DATA:  Follow-up examination for intracranial hemorrhage. EXAM: CT HEAD WITHOUT CONTRAST TECHNIQUE: Contiguous axial images were obtained from the base of the skull through the  vertex without intravenous contrast. COMPARISON:  Prior CT from 02/24/2018. FINDINGS: Brain: Previously seen moderate volume subarachnoid hemorrhage is slightly less conspicuous as compared to previous exam, overall likely slightly decreased. Hemorrhage as re-distributed from the sylvian fissures, now more prominent layering along several cortical sulci at the posterior cerebral hemispheres. Overall, hemorrhages greater within the right cerebral hemisphere as compared to the left. Increased hemorrhage seen layering within the occipital horns of both lateral ventricles, compatible with redistribution. No hydrocephalus or ventricular trapping. Basilar cisterns remain patent. No midline shift. Evolving small cortical contusions at the high anterior frontal lobes bilaterally, slightly increased in size from previous. Largest of these seen on the right and measures 1 cm. No significant mass effect. No other new acute intracranial hemorrhage. No acute large vessel territory infarct. No extra-axial fluid collection. Vascular: No hyperdense vessel. Skull: Evolving right-sided scalp contusion, decreased in size from previous. Calvarium unchanged. Sinuses/Orbits: Globes and orbital soft tissues demonstrate no acute finding. Mild scattered mucosal thickening within the ethmoidal air cells and maxillary sinuses. Paranasal sinuses are otherwise clear. Mastoid air cells remain largely clear. Other: None. IMPRESSION: 1. Interval decrease in moderate volume subarachnoid hemorrhage as compared to previous exam. Increased intraventricular blood consistent with redistribution. No hydrocephalus or ventricular trapping. 2. Evolving small bifrontal hemorrhagic contusions as above, right slightly larger than left. No significant mass effect. 3. No other new acute intracranial abnormality. 4. Interval decrease in large  right right-sided scalp contusion. Electronically Signed   By: Rise Mu M.D.   On: 02/25/2018 05:59   Ct  Head Wo Contrast  Result Date: 02/24/2018 CLINICAL DATA:  Female of unknown age found down. Unknown exact trauma mechanism. Seizing, altered mental status. Intubated for airway protection. EXAM: CT HEAD WITHOUT CONTRAST CT MAXILLOFACIAL WITHOUT CONTRAST CT CERVICAL SPINE WITHOUT CONTRAST TECHNIQUE: Multidetector CT imaging of the head, cervical spine, and maxillofacial structures were performed using the standard protocol without intravenous contrast. Multiplanar CT image reconstructions of the cervical spine and maxillofacial structures were also generated. COMPARISON:  Portable chest 1938 hours today. FINDINGS: CT HEAD FINDINGS Brain: Small to moderate volume of subarachnoid hemorrhage along both sylvian fissures and the bilateral superior and lateral convexities. The basilar cisterns are spared. There is slightly more blood along the right sylvian fissure and hemisphere. Superimposed small volume of right lateral intraventricular hemorrhage. No subdural hemorrhage identified. No ventriculomegaly. No intracranial mass effect or midline shift. Basilar cisterns remain normal. Gray-white matter differentiation is within normal limits throughout the brain. No cortically based acute infarct identified. Vascular: No suspicious intracranial vascular hyperdensity. Skull: No skull fracture identified. Other: Large right anterior convexity scalp hematoma up to 2.3 centimeters in thickness with superimposed small volume scalp soft tissue gas. Other scalp soft tissues are within normal limits. CT MAXILLOFACIAL FINDINGS Osseous: Mandible intact. Maxilla and zygoma intact. Nasal bones within normal limits. No acute facial fracture identified. Orbits: Orbital walls intact. Orbit soft tissues are within normal limits. Sinuses: Well pneumatized. There is fluid or debris in the right external auditory canal, but the right tympanic membrane and mastoids remain clear. Left middle ear and mastoids are clear. Soft tissues: Fluid in  the pharynx in the setting of intubation. ET tube and oral enteric tube in place. Negative noncontrast deep soft tissue spaces of the face. CT CERVICAL SPINE FINDINGS Alignment: Mild straightening of cervical lordosis. Cervicothoracic junction alignment is within normal limits. Bilateral posterior element alignment is within normal limits. Skull base and vertebrae: Visualized skull base is intact. No atlanto-occipital dissociation. No cervical spine fracture. Soft tissues and spinal canal: No prevertebral fluid or swelling. No visible canal hematoma. Endotracheal tube and enteric tube course appropriately into the trachea and upper esophagus. Disc levels:  Minimal degenerative changes. Upper chest: Negative lung apices. Visible upper thoracic levels appear intact. IMPRESSION: 1. Small to moderate volume of presumed post-traumatic Subarachnoid Hemorrhage along both Sylvian fissures and the cerebral convexities, more so on the right. The basilar cisterns are spared. 2. Superimposed small volume right lateral Intraventricular Hemorrhage. No ventriculomegaly. 3. No cerebral hemorrhagic contusion identified. No intracranial mass effect. No acute cortically based infarct. 4. Large right anterior convexity scalp hematoma but no underlying skull fracture identified. 5. The above critical value/emergent results were discussed by telephone with Dr. Andrey Campanile of Trauma Surgery at 2016 hours on 02/24/2018. 6. No face or cervical spine fracture. No other acute traumatic injury identified. Electronically Signed   By: Odessa Fleming M.D.   On: 02/24/2018 20:32   Ct Cervical Spine Wo Contrast  Result Date: 02/24/2018 CLINICAL DATA:  Female of unknown age found down. Unknown exact trauma mechanism. Seizing, altered mental status. Intubated for airway protection. EXAM: CT HEAD WITHOUT CONTRAST CT MAXILLOFACIAL WITHOUT CONTRAST CT CERVICAL SPINE WITHOUT CONTRAST TECHNIQUE: Multidetector CT imaging of the head, cervical spine, and  maxillofacial structures were performed using the standard protocol without intravenous contrast. Multiplanar CT image reconstructions of the cervical spine and maxillofacial structures were also generated. COMPARISON:  Portable chest 1938 hours today. FINDINGS: CT HEAD FINDINGS Brain: Small to moderate volume of subarachnoid hemorrhage along both sylvian fissures and the bilateral superior and lateral convexities. The basilar cisterns are spared. There is slightly more blood along the right sylvian fissure and hemisphere. Superimposed small volume of right lateral intraventricular hemorrhage. No subdural hemorrhage identified. No ventriculomegaly. No intracranial mass effect or midline shift. Basilar cisterns remain normal. Gray-white matter differentiation is within normal limits throughout the brain. No cortically based acute infarct identified. Vascular: No suspicious intracranial vascular hyperdensity. Skull: No skull fracture identified. Other: Large right anterior convexity scalp hematoma up to 2.3 centimeters in thickness with superimposed small volume scalp soft tissue gas. Other scalp soft tissues are within normal limits. CT MAXILLOFACIAL FINDINGS Osseous: Mandible intact. Maxilla and zygoma intact. Nasal bones within normal limits. No acute facial fracture identified. Orbits: Orbital walls intact. Orbit soft tissues are within normal limits. Sinuses: Well pneumatized. There is fluid or debris in the right external auditory canal, but the right tympanic membrane and mastoids remain clear. Left middle ear and mastoids are clear. Soft tissues: Fluid in the pharynx in the setting of intubation. ET tube and oral enteric tube in place. Negative noncontrast deep soft tissue spaces of the face. CT CERVICAL SPINE FINDINGS Alignment: Mild straightening of cervical lordosis. Cervicothoracic junction alignment is within normal limits. Bilateral posterior element alignment is within normal limits. Skull base and  vertebrae: Visualized skull base is intact. No atlanto-occipital dissociation. No cervical spine fracture. Soft tissues and spinal canal: No prevertebral fluid or swelling. No visible canal hematoma. Endotracheal tube and enteric tube course appropriately into the trachea and upper esophagus. Disc levels:  Minimal degenerative changes. Upper chest: Negative lung apices. Visible upper thoracic levels appear intact. IMPRESSION: 1. Small to moderate volume of presumed post-traumatic Subarachnoid Hemorrhage along both Sylvian fissures and the cerebral convexities, more so on the right. The basilar cisterns are spared. 2. Superimposed small volume right lateral Intraventricular Hemorrhage. No ventriculomegaly. 3. No cerebral hemorrhagic contusion identified. No intracranial mass effect. No acute cortically based infarct. 4. Large right anterior convexity scalp hematoma but no underlying skull fracture identified. 5. The above critical value/emergent results were discussed by telephone with Dr. Andrey CampanileWilson of Trauma Surgery at 2016 hours on 02/24/2018. 6. No face or cervical spine fracture. No other acute traumatic injury identified. Electronically Signed   By: Odessa FlemingH  Hall M.D.   On: 02/24/2018 20:32   Dg Chest Port 1 View  Result Date: 02/24/2018 CLINICAL DATA:  Seizure with fall. Intubation and OG tube placement. EXAM: PORTABLE CHEST 1 VIEW COMPARISON:  None. FINDINGS: Endotracheal tube terminates at the level of the clavicles. Enteric tube courses into the stomach with tip below the inferior margin of the image. Tubing projecting over the lateral right chest is presumably external to the patient. The cardiomediastinal silhouette is within normal limits. No airspace consolidation, edema, pleural effusion, pneumothorax is identified. No acute osseous abnormality is identified. IMPRESSION: Support devices as above. No evidence of acute airspace disease. Electronically Signed   By: Sebastian AcheAllen  Grady M.D.   On: 02/24/2018 20:04    Ct Maxillofacial Wo Contrast  Result Date: 02/24/2018 CLINICAL DATA:  Female of unknown age found down. Unknown exact trauma mechanism. Seizing, altered mental status. Intubated for airway protection. EXAM: CT HEAD WITHOUT CONTRAST CT MAXILLOFACIAL WITHOUT CONTRAST CT CERVICAL SPINE WITHOUT CONTRAST TECHNIQUE: Multidetector CT imaging of the head, cervical spine, and maxillofacial structures were performed using the standard protocol without intravenous contrast. Multiplanar CT  image reconstructions of the cervical spine and maxillofacial structures were also generated. COMPARISON:  Portable chest 1938 hours today. FINDINGS: CT HEAD FINDINGS Brain: Small to moderate volume of subarachnoid hemorrhage along both sylvian fissures and the bilateral superior and lateral convexities. The basilar cisterns are spared. There is slightly more blood along the right sylvian fissure and hemisphere. Superimposed small volume of right lateral intraventricular hemorrhage. No subdural hemorrhage identified. No ventriculomegaly. No intracranial mass effect or midline shift. Basilar cisterns remain normal. Gray-white matter differentiation is within normal limits throughout the brain. No cortically based acute infarct identified. Vascular: No suspicious intracranial vascular hyperdensity. Skull: No skull fracture identified. Other: Large right anterior convexity scalp hematoma up to 2.3 centimeters in thickness with superimposed small volume scalp soft tissue gas. Other scalp soft tissues are within normal limits. CT MAXILLOFACIAL FINDINGS Osseous: Mandible intact. Maxilla and zygoma intact. Nasal bones within normal limits. No acute facial fracture identified. Orbits: Orbital walls intact. Orbit soft tissues are within normal limits. Sinuses: Well pneumatized. There is fluid or debris in the right external auditory canal, but the right tympanic membrane and mastoids remain clear. Left middle ear and mastoids are clear. Soft  tissues: Fluid in the pharynx in the setting of intubation. ET tube and oral enteric tube in place. Negative noncontrast deep soft tissue spaces of the face. CT CERVICAL SPINE FINDINGS Alignment: Mild straightening of cervical lordosis. Cervicothoracic junction alignment is within normal limits. Bilateral posterior element alignment is within normal limits. Skull base and vertebrae: Visualized skull base is intact. No atlanto-occipital dissociation. No cervical spine fracture. Soft tissues and spinal canal: No prevertebral fluid or swelling. No visible canal hematoma. Endotracheal tube and enteric tube course appropriately into the trachea and upper esophagus. Disc levels:  Minimal degenerative changes. Upper chest: Negative lung apices. Visible upper thoracic levels appear intact. IMPRESSION: 1. Small to moderate volume of presumed post-traumatic Subarachnoid Hemorrhage along both Sylvian fissures and the cerebral convexities, more so on the right. The basilar cisterns are spared. 2. Superimposed small volume right lateral Intraventricular Hemorrhage. No ventriculomegaly. 3. No cerebral hemorrhagic contusion identified. No intracranial mass effect. No acute cortically based infarct. 4. Large right anterior convexity scalp hematoma but no underlying skull fracture identified. 5. The above critical value/emergent results were discussed by telephone with Dr. Andrey Campanile of Trauma Surgery at 2016 hours on 02/24/2018. 6. No face or cervical spine fracture. No other acute traumatic injury identified. Electronically Signed   By: Odessa Fleming M.D.   On: 02/24/2018 20:32    Assessment/Plan: Traumatic brain injury, subarachnoid hemorrhage, intraventricular hemorrhage: The patient is doing better clinically.  I suspect she had a seizure or syncopal event and struck her head.  She looks like she is okay for extubation.  I discussed this with Dr. Andrey Campanile.  We are awaiting the EEG results.  Dr. Amada Jupiter is following.  LOS: 1 day      Cristi Loron 02/25/2018, 7:28 AM

## 2018-02-25 NOTE — Progress Notes (Signed)
PT Cancellation Note  Patient Details Name: Warren LacyChristina Danielle Wandel MRN: [id number] DOB: 02/17/1981   Cancelled Treatment:    Reason Eval/Treat Not Completed: Other (comment)(pt with continuous EEG at this time)   Evelise Reine B Chandria Rookstool 02/25/2018, 1:07 PM  Shulem Mader Abner Greenspanabor Gorje Iyer, PT Acute Rehabilitation Services Pager: [phone] Office: [phone]

## 2018-02-25 NOTE — Procedures (Addendum)
   [facility] __________________  Video EEG Monitoring Report     Dates of recording: 02/25/2018 @23 :04 to 02/25/2018 @11 :04   Recording day: 1 (started on 11/12)   Interpreting physician: Delbert Phenixan-Andrei Cynthea Zachman, MD       CPT: [phone]   ICD-10: G40.901          Present History: 37 year old woman with traumatic SAH and seizures. Continuous VEEG requested to evaluate for seizures.  EEG Details: Continuous video EEG was performed using standard setting per the guidelines of American Clinical Neurophysiology Society (ACNS). A minimum of 21 electrodes were placed on scalp according to the International 10-20 or 10-10 system. Supplemental electrodes were placed as needed. Single EKG electrode was also used to detect cardiac arrhythmia. Recording was performed at a sampling rate of at least 256 Hz. Patient's behavior was continuously recorded on video simultaneously with EEG. A minimum of 18 channels were used for data display. Each epoch of study was reviewed manually daily and as needed using standard digital review software allowing for montage reformatting, gain and filter changes on a display system of sufficient resolution to prevent aliasing. Computerized quantitative EEG analysis (such as compressed spectral array analysis, dipole analysis, trending, automated spike & seizure detection) was used as indicated.  Description of EEG features: State of patient: Stupor <--> Awake  Background Activity Overall Amplitude:  low amplitude, symmetric  Predominant Frequency: Diffuse 5-6 Hz dominant activity is seen. However, after ~04:20, arousals with the development of a ~7-8 Hz PDR are seen as well. Superimposed Frequencies: continuous medium amplitude (20-50 V) polymorphic delta>theta rhythms, bilateral and symmetric Reactivity to stimulation: unclear Asymmetry: No  Breach rhythm: No  Sleep: No sleep is recorded.  Background abnormalities: Yes 1. Continuous slow,  generalized  Even during maximal alertness, continuous, waxing and waning, diffuse polymorphic delta slowing is seen.  Periodic or rhythmic abnormalities: Yes 1. Sharp LPDs, left hemisphere This pattern is continuous (>90% of the recording) but waning in amplitude and sharp morphology, with a frequency of about 1 Hz and waveforms that are biphasic, with sharp morphology and medium amplitude (50-200 V).   Epileptiform discharges: Yes - as above Paroxysmal events or seizures: Yes Electrographic seizure 02/25/18 @00 :03  Clinical: no clinical signs  EEG: ictal pattern, left anterior temporal (F7>T7)  Push button events: No  EKG: 120 bpm and regular  Impression: This EEG is indicative of  1. 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 There is a periodic pattern of epileptiform discharges ("PLEDs") which lies on the ictal-interictal continuum, One electrographic seizure was seen at 00:03. Subsequently the PLEDs pattern slowly wanes in severity, but persists.   2. A diffuse encephalopathy which is initially severe, although medication effects cannot be excluded, and which also improves over the course of the epoch.

## 2018-02-25 NOTE — Progress Notes (Signed)
LTM EEG checked, T4 repreped and paste added. A2 reapplied.

## 2018-02-25 NOTE — Evaluation (Signed)
Speech Language Pathology Evaluation Patient Details Name: Alyssa LacyChristina Danielle Little MRN: [id number] DOB: 01/12/1981 Today's Date: 02/25/2018 Time: [id number] SLP Time Calculation (min) (ACUTE ONLY): 12 min  Problem List:  Patient Active Problem List   Diagnosis Date Noted  . SAH (subarachnoid hemorrhage) (HCC) 02/24/2018   Past Medical History: No past medical history on file. Past Surgical History:  HPI:   [age] yof admitted from work at [employer] with unwitnessed falls with seizures and extensive traumatic SAH. Intubated 11/11-11/12.  CT 11/12: Interval decrease in moderate volume SAH as compared to previous exam. Increased intraventricular blood consistent with redistribution.  Evolving small bifrontal hemorrhagic contusions as above, right slightly larger than left. No significant mass effect.   Assessment / Plan / Recommendation Clinical Impression  Pt presents generally as a Rancho Level V (confused/inappropriate) with some intermittent agitation (level IV), pulling at lines and mitts with occasional heightened restlessness.  Requires max tactile/verbal cues to focus and sustain attention in order to follow simple commands.  She is able to state name, DOB, but is not oriented to time/place/situation.  She can recite automatic sequences (DOW, months of year) with clear articulation, but extremely low volume, making intelligibility quite poor.  She requested information on one occasion, asking "What time is it?", but required requests to repeat because her volume was so low.  No family present in room at time of assessment.  Recommend acute SLP intervention to address fundamental communication, attention and basic recall.      SLP Assessment  SLP Recommendation/Assessment: Patient needs continued Speech Lanaguage Pathology Services SLP Visit Diagnosis: Cognitive communication deficit (R41.841)    Follow Up Recommendations  Inpatient Rehab    Frequency and Duration min 3x week  2 weeks       SLP Evaluation Cognition  Overall Cognitive Status: Impaired/Different from baseline Arousal/Alertness: Awake/alert Orientation Level: Oriented to person;Disoriented to place;Disoriented to time;Disoriented to situation Attention: Focused Focused Attention: Impaired Focused Attention Impairment: Verbal basic Memory: Impaired Memory Impairment: Storage deficit Awareness: Impaired Awareness Impairment: Intellectual impairment Behaviors: Restless;Physical agitation;Poor frustration tolerance Safety/Judgment: Impaired Rancho MirantLos Amigos Scales of Cognitive Functioning: Confused/inappropriate/non-agitated       Comprehension  Auditory Comprehension Yes/No Questions: Impaired Basic Biographical Questions: 51-75% accurate Commands: Impaired Two Step Basic Commands: 25-49% accurate Interfering Components: Attention;Pain    Expression Expression Primary Mode of Expression: Verbal Verbal Expression Overall Verbal Expression: Impaired Initiation: Impaired Automatic Speech: (WFL) Level of Generative/Spontaneous Verbalization: Phrase Repetition: No impairment Written Expression Written Expression: Not tested   Oral / Motor  Oral Motor/Sensory Function Overall Oral Motor/Sensory Function: Within functional limits Motor Speech Overall Motor Speech: Appears within functional limits for tasks assessed   GO                    Blenda MountsCouture, Jammy Plotkin Laurice 02/25/2018, 12:24 PM   Renan Danese L. Samson Fredericouture, MA CCC/SLP Acute Rehabilitation Services Office number [phone] Pager [phone]

## 2018-02-25 NOTE — Progress Notes (Signed)
Overnight EEG initiated.  Dr Amada JupiterKirkpatrick viewed.

## 2018-02-25 NOTE — Progress Notes (Signed)
EEG with PLEDs-proper pattern. Though on the ictal-interictal continuum, in this patient with clear structural injury(bilateral traumatic SAH) I suspect an injury pattern. She will be treated with anti-epileptics.   Ritta SlotMcNeill Tasean Mancha, MD [facility] [phone]  If 7pm- 7am, please page neurology on call as listed in AMION.

## 2018-02-25 NOTE — Procedures (Signed)
Extubation Procedure Note  Patient Details:   Name: Alyssa Little DOB: 14-Sep-1980 MRN: [id number]   Airway Documentation:    Vent end date: 02/25/18 Vent end time: 0850   Evaluation  O2 sats: stable throughout Complications: No apparent complications Patient did tolerate procedure well. Bilateral Breath Sounds: Rhonchi, Diminished   Yes  Patient refused to cough or clear secretions RN aware at bedside Kara Pacer 02/25/2018, 9:22 AM

## 2018-02-25 NOTE — Progress Notes (Signed)
Neurology Progress Note   S:// Seen and examined. Extubated this AM. Follows commands. Non verbal. LTM EEG with left sided PLEDs Patient has a history of alcoholism at baseline   O:// Current vital signs: BP (!) 82/64   Pulse (!) 123   Temp 98 F (36.7 C) (Axillary)   Resp 16   Ht '5\' 1"'$  (1.549 m)   Wt 54.4 kg   LMP  (LMP Unknown)   SpO2 94%   BMI 22.67 kg/m  Vital signs in last 24 hours: Temp:  [97.5 F (36.4 C)-99.1 F (37.3 C)] 98 F (36.7 C) (11/12 0800) Pulse Rate:  [108-159] 123 (11/12 0700) Resp:  [9-24] 16 (11/12 0700) BP: (81-150)/(64-106) 82/64 (11/12 0700) SpO2:  [49 %-100 %] 94 % (11/12 0850) FiO2 (%):  [40 %-100 %] 40 % (11/12 0832) Weight:  [54.4 kg] 54.4 kg (11/11 1946) General: Sedation discontinued this morning, very fidgety and appears in some distress H ENT: Normocephalic with multiple bruises and lacerations on the head CVS: S1-S2 heard regular rate rhythm Respiratory: Chest clear to auscultation Abdomen: Nondistended nontender Neurological exam Patient is drowsy and writhing in bed. She opens eyes to voice. She follows some simple commands like wiggling her toes and trying to lift her arms. She has no focal weakness notable at this time. She does not verbalize at all. Her pupils are equal round reactive to light, extraocular movements remain intact, does not blink to threat from either side, face is symmetric. She was extubated this morning.  Medications  Current Facility-Administered Medications:  .  0.9 % NaCl with KCl 20 mEq/ L  infusion, , Intravenous, Continuous, Greer Pickerel, MD, Last Rate: 100 mL/hr at 02/25/18 0857 .  calcium gluconate 1 g/ 50 mL sodium chloride IVPB, 1 g, Intravenous, Once, Greer Pickerel, MD .  chlorhexidine gluconate (MEDLINE KIT) (PERIDEX) 0.12 % solution 15 mL, 15 mL, Mouth Rinse, BID, Greer Pickerel, MD, 15 mL at 02/25/18 0820 .  fentaNYL (SUBLIMAZE) bolus via infusion 50 mcg, 50 mcg, Intravenous, Q1H PRN,  Georganna Skeans, MD .  fentaNYL (SUBLIMAZE) injection 50 mcg, 50 mcg, Intravenous, Once, Greer Pickerel, MD .  fentaNYL 2539mg in NS 2544m(1025mml) infusion-PREMIX, 25-400 mcg/hr, Intravenous, Continuous, WilGreer PickerelD, Last Rate: 5 mL/hr at 02/25/18 0700, 50 mcg/hr at 02/25/18 0700 .  folic acid (FOLVITE) tablet 1 mg, 1 mg, Oral, Daily, ThoGeorganna SkeansD .  levETIRAcetam (KEPPRA) IVPB 1000 mg/100 mL premix, 1,000 mg, Intravenous, Q12H, KirGreta DoomD, Last Rate: 400 mL/hr at 02/25/18 0819, 1,000 mg at 02/25/18 0819 .  LORazepam (ATIVAN) injection 0-4 mg, 0-4 mg, Intravenous, Q6H **FOLLOWED BY** [START ON 02/27/2018] LORazepam (ATIVAN) injection 0-4 mg, 0-4 mg, Intravenous, Q12H, ThoGeorganna SkeansD .  LORazepam (ATIVAN) tablet 1 mg, 1 mg, Oral, Q6H PRN **OR** LORazepam (ATIVAN) injection 1 mg, 1 mg, Intravenous, Q6H PRN, ThoGeorganna SkeansD .  MEDLINE mouth rinse, 15 mL, Mouth Rinse, 10 times per day, WilGreer PickerelD, 15 mL at 02/25/18 0544 .  metoprolol tartrate (LOPRESSOR) injection 2.5 mg, 2.5 mg, Intravenous, Q6H PRN, ThoGeorganna SkeansD, 2.5 mg at 02/25/18 0904 .  multivitamin with minerals tablet 1 tablet, 1 tablet, Oral, Daily, ThoGeorganna SkeansD .  phenytoin (DILANTIN) injection 100 mg, 100 mg, Intravenous, Q8H, Kirkpatrick, McNVida RollerD .  propofol (DIPRIVAN) 1000 MG/100ML infusion, 0-50 mcg/kg/min, Intravenous, Continuous, WilGreer PickerelD, Last Rate: 6.53 mL/hr at 02/25/18 0700, 20 mcg/kg/min at 02/25/18 0700 .  thiamine (VITAMIN B-1) tablet 100 mg, 100 mg,  Oral, Daily **OR** thiamine (B-1) injection 100 mg, 100 mg, Intravenous, Daily, Georganna Skeans, MD Labs CBC    Component Value Date/Time   WBC 8.6 02/25/2018 0420   RBC 2.09 (L) 02/25/2018 0420   HGB 7.7 (L) 02/25/2018 0420   HCT 24.0 (L) 02/25/2018 0420   PLT 122 (L) 02/25/2018 0420   MCV 114.8 (H) 02/25/2018 0420   MCH 36.8 (H) 02/25/2018 0420   MCHC 32.1 02/25/2018 0420   RDW 13.5 02/25/2018 0420     CMP     Component Value Date/Time   NA 134 (L) 02/25/2018 0420   K 4.3 02/25/2018 0420   CL 106 02/25/2018 0420   CO2 16 (L) 02/25/2018 0420   GLUCOSE 103 (H) 02/25/2018 0420   BUN <5 (L) 02/25/2018 0420   CREATININE 0.53 02/25/2018 0420   CALCIUM 5.8 (LL) 02/25/2018 0420   PROT 5.2 (L) 02/25/2018 0420   ALBUMIN 2.6 (L) 02/25/2018 0420   AST 182 (H) 02/25/2018 0420   ALT 58 (H) 02/25/2018 0420   ALKPHOS 92 02/25/2018 0420   BILITOT 1.5 (H) 02/25/2018 0420   GFRNONAA >60 02/25/2018 0420   GFRAA >60 02/25/2018 0420   Imaging I have reviewed images in epic and the results pertinent to this consultation are: CT scan of the head shows bilateral perisylvian subarachnoid's.  Assessment:  37 year old woman who had an unwitnessed fall and seizure presenting for evaluation with unknown medical history, found to have extensive traumatic subarachnoid bleeds and on EEG had PLEDs proper pattern-although on the ictal interictal continuum, with the structural abnormality, antiepileptics were started. She was extubated successfully this morning, indicating some improvement in her mentation. Neurosurgery is following We would continue her on LTM for another 24 hours and continue the antiplatelets for now.  Impression: Traumatic brain injury, traumatic subarachnoid hemorrhage Seizures-EEG not revealing of status epilepticus.  Recommendations: Continue with current doses of antiepileptics Keppra 1 g twice daily, Dilantin 100 3 times daily.  And Vimpat 100 twice daily. Maintain seizure precautions Continue LTM EEG When LTM is removed, obtain MRI of the brain at that point. CIWA protocol Neurology will follow   -- Amie Portland, MD [facility] Pager: [phone] If 7pm to 7am, please call on call as listed on AMION.  CRITICAL CARE ATTESTATION This patient is critically ill and at significant risk of neurological worsening, death and care requires constant monitoring of  vital signs, hemodynamics, respiratory, and cardiac monitoring. I spent 30  minutes of neurocritical care time performing neurological assessment, discussion with family, other specialists and medical decision making of high complexity in the care of  this patient.

## 2018-02-25 NOTE — Progress Notes (Signed)
   02/25/18 1300  Clinical Encounter Type  Visited With Patient not available;Family  Visit Type Trauma  Referral From Chaplain  Spiritual Encounters  Spiritual Needs Emotional   Visited on referral from overnight chaplain.  Spoke w/ Cindee LamePete, friend from work, who was in the pt's room.  He said that the pt's aunt had been reached and is expected to arrive at 1:30pm.  Pt was working w/ Charity fundraiserN then appearing to sleep/rest, so I let her rest.  Chaplain let Cindee Lameete know that the pt and friends/family can request the nursing staff ask for a chaplain for add'l support if desired.  Cindee Lameete said pt went by [place].  Margretta SidleAndrea M [facility] Chaplain resident, [phone]

## 2018-02-26 ENCOUNTER — Inpatient Hospital Stay (HOSPITAL_COMMUNITY): Payer: Managed Care, Other (non HMO)

## 2018-02-26 DIAGNOSIS — E440 Moderate protein-calorie malnutrition: Secondary | ICD-10-CM

## 2018-02-26 LAB — BPAM RBC
BLOOD PRODUCT EXPIRATION DATE: 201912032359
Blood Product Expiration Date: 201912032359
Blood Product Expiration Date: 201912042359
Blood Product Expiration Date: 201912042359
ISSUE DATE / TIME: 201911111941
ISSUE DATE / TIME: 201911121225
ISSUE DATE / TIME: 201911121445
ISSUE DATE / TIME: 201911111941
UNIT TYPE AND RH: 5100
UNIT TYPE AND RH: 5100
UNIT TYPE AND RH: 9500
Unit Type and Rh: 9500

## 2018-02-26 LAB — CBC
HCT: 38.1 % (ref 36.0–46.0)
HCT: 36.6 % (ref 36.0–46.0)
Hemoglobin: 13 g/dL (ref 12.0–15.0)
Hemoglobin: 12.2 g/dL (ref 12.0–15.0)
MCH: 32.9 pg (ref 26.0–34.0)
MCH: 31.7 pg (ref 26.0–34.0)
MCHC: 34.1 g/dL (ref 30.0–36.0)
MCHC: 33.3 g/dL (ref 30.0–36.0)
MCV: 96.5 fL (ref 80.0–100.0)
MCV: 95.1 fL (ref 80.0–100.0)
NRBC: 0 % (ref 0.0–0.2)
Platelets: 107 10*3/uL — ABNORMAL LOW (ref 150–400)
Platelets: 104 10*3/uL — ABNORMAL LOW (ref 150–400)
RBC: 3.95 MIL/uL (ref 3.87–5.11)
RBC: 3.85 MIL/uL — ABNORMAL LOW (ref 3.87–5.11)
RDW: 21.6 % — AB (ref 11.5–15.5)
WBC: 14 10*3/uL — ABNORMAL HIGH (ref 4.0–10.5)
WBC: 10 10*3/uL (ref 4.0–10.5)
nRBC: 0 % (ref 0.0–0.2)

## 2018-02-26 LAB — BASIC METABOLIC PANEL
Anion gap: 9 (ref 5–15)
CHLORIDE: 108 mmol/L (ref 98–111)
CO2: 18 mmol/L — ABNORMAL LOW (ref 22–32)
Calcium: 6.3 mg/dL — CL (ref 8.9–10.3)
Creatinine, Ser: 0.55 mg/dL (ref 0.44–1.00)
GFR calc non Af Amer: >60 mL/min (ref >60)
Glucose, Bld: 98 mg/dL (ref 70–99)
POTASSIUM: 2.9 mmol/L — AB (ref 3.5–5.1)
SODIUM: 135 mmol/L (ref 135–145)

## 2018-02-26 LAB — TYPE AND SCREEN
ABO/RH(D): O POS
ANTIBODY SCREEN: NEGATIVE
UNIT DIVISION: 0
UNIT DIVISION: 0
Unit division: 0
Unit division: 0

## 2018-02-26 LAB — GLUCOSE, CAPILLARY
Glucose-Capillary: 106 mg/dL — ABNORMAL HIGH (ref 70–99)
Glucose-Capillary: 109 mg/dL — ABNORMAL HIGH (ref 70–99)

## 2018-02-26 LAB — MAGNESIUM: MAGNESIUM: 1.1 mg/dL — AB (ref 1.7–2.4)

## 2018-02-26 LAB — PHOSPHORUS: Phosphorus: <1 mg/dL — CL (ref 2.5–4.6)

## 2018-02-26 MED ORDER — SODIUM CHLORIDE 0.9 % IV SOLN
100.0000 mg | Freq: Once | INTRAVENOUS | Status: AC
  Administered 2018-02-26: 100 mg via INTRAVENOUS
  Filled 2018-02-26: qty 10

## 2018-02-26 MED ORDER — POTASSIUM CHLORIDE 10 MEQ/100ML IV SOLN
10.0000 meq | INTRAVENOUS | Status: AC
  Administered 2018-02-26 (×6): 10 meq via INTRAVENOUS
  Filled 2018-02-26 (×6): qty 100

## 2018-02-26 MED ORDER — PIVOT 1.5 CAL PO LIQD
1000.0000 mL | ORAL | Status: DC
  Administered 2018-02-26 – 2018-03-02 (×5): 1000 mL
  Filled 2018-02-26: qty 1000

## 2018-02-26 MED ORDER — ORAL CARE MOUTH RINSE
15.0000 mL | Freq: Two times a day (BID) | OROMUCOSAL | Status: DC
  Administered 2018-02-26 – 2018-03-05 (×12): 15 mL via OROMUCOSAL

## 2018-02-26 MED ORDER — SODIUM CHLORIDE 0.9 % IV SOLN
200.0000 mg | Freq: Two times a day (BID) | INTRAVENOUS | Status: DC
  Administered 2018-02-26 – 2018-03-03 (×12): 200 mg via INTRAVENOUS
  Filled 2018-02-26 (×13): qty 20

## 2018-02-26 MED ORDER — CALCIUM GLUCONATE-NACL 1-0.675 GM/50ML-% IV SOLN
1.0000 g | Freq: Once | INTRAVENOUS | Status: AC
  Administered 2018-02-26: 1000 mg via INTRAVENOUS
  Filled 2018-02-26: qty 50

## 2018-02-26 MED ORDER — INFLUENZA VAC SPLIT QUAD 0.5 ML IM SUSY
0.5000 mL | PREFILLED_SYRINGE | INTRAMUSCULAR | Status: DC
  Filled 2018-02-26: qty 0.5

## 2018-02-26 MED ORDER — CHLORHEXIDINE GLUCONATE 0.12 % MT SOLN
15.0000 mL | Freq: Two times a day (BID) | OROMUCOSAL | Status: DC
  Administered 2018-02-26 – 2018-03-05 (×14): 15 mL via OROMUCOSAL
  Filled 2018-02-26 (×6): qty 15

## 2018-02-26 MED ORDER — SODIUM PHOSPHATES 45 MMOLE/15ML IV SOLN
10.0000 mmol | Freq: Once | INTRAVENOUS | Status: AC
  Administered 2018-02-26: 10 mmol via INTRAVENOUS
  Filled 2018-02-26: qty 3.33

## 2018-02-26 MED ORDER — MIDAZOLAM HCL 2 MG/2ML IJ SOLN
2.0000 mg | INTRAMUSCULAR | Status: DC | PRN
  Administered 2018-02-27 – 2018-03-01 (×3): 2 mg via INTRAVENOUS
  Filled 2018-02-26 (×3): qty 2

## 2018-02-26 MED ORDER — PNEUMOCOCCAL VAC POLYVALENT 25 MCG/0.5ML IJ INJ
0.5000 mL | INJECTION | INTRAMUSCULAR | Status: DC
  Filled 2018-02-26: qty 0.5

## 2018-02-26 MED ORDER — PIPERACILLIN-TAZOBACTAM 3.375 G IVPB
3.3750 g | Freq: Three times a day (TID) | INTRAVENOUS | Status: AC
  Administered 2018-02-26 – 2018-03-04 (×17): 3.375 g via INTRAVENOUS
  Filled 2018-02-26 (×17): qty 50

## 2018-02-26 MED ORDER — LEVETIRACETAM IN NACL 1500 MG/100ML IV SOLN
1500.0000 mg | Freq: Two times a day (BID) | INTRAVENOUS | Status: DC
  Administered 2018-02-26 – 2018-03-04 (×13): 1500 mg via INTRAVENOUS
  Filled 2018-02-26 (×13): qty 100

## 2018-02-26 MED ORDER — VALPROATE SODIUM 500 MG/5ML IV SOLN
500.0000 mg | Freq: Once | INTRAVENOUS | Status: AC
  Administered 2018-02-26: 500 mg via INTRAVENOUS
  Filled 2018-02-26: qty 5

## 2018-02-26 NOTE — Progress Notes (Signed)
   Providing Compassionate, Quality Care - Together   Subjective: Patient is sedated with Ativan due to agitation this morning. Her aunt is at the bedside and reports the patient was somewhat interactive before the medication, but very restless. Pt's aunt reports the patient could say her name, but was confused about location and time.  Objective: Vital signs in last 24 hours: Temp:  [97.7 F (36.5 C)-101.7 F (38.7 C)] 100.6 F (38.1 C) (11/13 1030) Pulse Rate:  [78-148] 123 (11/13 1030) Resp:  [18-40] 40 (11/13 1030) BP: (87-125)/(64-100) 111/86 (11/13 1030) SpO2:  [82 %-100 %] 100 % (11/13 1030)  Intake/Output from previous day: 11/12 0701 - 11/13 0700 In: 3358 [I.V.:2419.3; Blood:403.3; IV Piggyback:535.3] Out: 4750 [Urine:4750] Intake/Output this shift: No intake/output data recorded.  Pt sedated on Ativan due to restlessness earlier this morning. She is moving all extremities, but not interactive at this time. PERRLA bilaterally  Lab Results: Recent Labs    02/25/18 1859 02/26/18 0303  WBC 14.0* 10.0  HGB 13.0 12.2  HCT 38.1 36.6  PLT 107* 104*   BMET Recent Labs    02/25/18 0420 02/26/18 0303  NA 134* 135  K 4.3 2.9*  CL 106 108  CO2 16* 18*  GLUCOSE 103* 98  BUN <5* <5*  CREATININE 0.53 0.55  CALCIUM 5.8* 6.3*    Studies/Results: Continuos EEG in progress  Assessment/Plan: Pt is 2 days s/p fall with head trauma and seizures. She sustained a subarachnoid hemorrhage and intraventricular hemorrhage. Her CT scan yesterday showed mild improvement. She was extubated yesterday morning. She is interactive per report from her aunt. She was able to say her name, but was disoriented to place, time, and situation. She is moving all extremities. Pt should work with PT/OT once EEG is discontinued.   LOS: 2 days   Val EagleMeghan Anny Sayler, DNP, AGNP-C Nurse Practitioner  [facility] [address], [address], [place], [place] [address] P:  [phone]    F: [phone]  02/26/2018, 11:13 AM

## 2018-02-26 NOTE — Progress Notes (Signed)
Subjective: The patient is extubated.  She was recently given Ativan for agitation.  She is somnolent but arousable.  Her aunt is at the bedside.  Objective: Vital signs in last 24 hours: Temp:  [100.2 F (37.9 C)-101.7 F (38.7 C)] 100.9 F (38.3 C) (11/13 1330) Pulse Rate:  [48-137] 128 (11/13 1330) Resp:  [19-44] 44 (11/13 1330) BP: (88-128)/(68-100) 109/92 (11/13 1330) SpO2:  [85 %-100 %] 98 % (11/13 1330) Estimated body mass index is 22.67 kg/m as calculated from the following:   Height as of this encounter: 5\' 1"  (1.549 m).   Weight as of this encounter: 54.4 kg.   Intake/Output from previous day: 11/12 0701 - 11/13 0700 In: 3358 [I.V.:2419.3; Blood:403.3; IV Piggyback:535.3] Out: 4750 [Urine:4750] Intake/Output this shift: Total I/O In: 847.7 [I.V.:490; IV Piggyback:357.8] Out: -   Physical exam Glasgow Coma Scale 12, E3M6V3.  She moves all 4 extremities.  She babbles and mumbles.  Lab Results: Recent Labs    02/25/18 1859 02/26/18 0303  WBC 14.0* 10.0  HGB 13.0 12.2  HCT 38.1 36.6  PLT 107* 104*   BMET Recent Labs    02/25/18 0420 02/26/18 0303  NA 134* 135  K 4.3 2.9*  CL 106 108  CO2 16* 18*  GLUCOSE 103* 98  BUN <5* <5*  CREATININE 0.53 0.55  CALCIUM 5.8* 6.3*    Studies/Results: Ct Head Wo Contrast  Result Date: 02/25/2018 CLINICAL DATA:  Follow-up examination for intracranial hemorrhage. EXAM: CT HEAD WITHOUT CONTRAST TECHNIQUE: Contiguous axial images were obtained from the base of the skull through the vertex without intravenous contrast. COMPARISON:  Prior CT from 02/24/2018. FINDINGS: Brain: Previously seen moderate volume subarachnoid hemorrhage is slightly less conspicuous as compared to previous exam, overall likely slightly decreased. Hemorrhage as re-distributed from the sylvian fissures, now more prominent layering along several cortical sulci at the posterior cerebral hemispheres. Overall, hemorrhages greater within the right cerebral  hemisphere as compared to the left. Increased hemorrhage seen layering within the occipital horns of both lateral ventricles, compatible with redistribution. No hydrocephalus or ventricular trapping. Basilar cisterns remain patent. No midline shift. Evolving small cortical contusions at the high anterior frontal lobes bilaterally, slightly increased in size from previous. Largest of these seen on the right and measures 1 cm. No significant mass effect. No other new acute intracranial hemorrhage. No acute large vessel territory infarct. No extra-axial fluid collection. Vascular: No hyperdense vessel. Skull: Evolving right-sided scalp contusion, decreased in size from previous. Calvarium unchanged. Sinuses/Orbits: Globes and orbital soft tissues demonstrate no acute finding. Mild scattered mucosal thickening within the ethmoidal air cells and maxillary sinuses. Paranasal sinuses are otherwise clear. Mastoid air cells remain largely clear. Other: None. IMPRESSION: 1. Interval decrease in moderate volume subarachnoid hemorrhage as compared to previous exam. Increased intraventricular blood consistent with redistribution. No hydrocephalus or ventricular trapping. 2. Evolving small bifrontal hemorrhagic contusions as above, right slightly larger than left. No significant mass effect. 3. No other new acute intracranial abnormality. 4. Interval decrease in large right right-sided scalp contusion. Electronically Signed   By: Rise Mu M.D.   On: 02/25/2018 05:59   Ct Head Wo Contrast  Result Date: 02/24/2018 CLINICAL DATA:  Female of unknown age found down. Unknown exact trauma mechanism. Seizing, altered mental status. Intubated for airway protection. EXAM: CT HEAD WITHOUT CONTRAST CT MAXILLOFACIAL WITHOUT CONTRAST CT CERVICAL SPINE WITHOUT CONTRAST TECHNIQUE: Multidetector CT imaging of the head, cervical spine, and maxillofacial structures were performed using the standard protocol without intravenous  contrast. Multiplanar CT image reconstructions of the cervical spine and maxillofacial structures were also generated. COMPARISON:  Portable chest 1938 hours today. FINDINGS: CT HEAD FINDINGS Brain: Small to moderate volume of subarachnoid hemorrhage along both sylvian fissures and the bilateral superior and lateral convexities. The basilar cisterns are spared. There is slightly more blood along the right sylvian fissure and hemisphere. Superimposed small volume of right lateral intraventricular hemorrhage. No subdural hemorrhage identified. No ventriculomegaly. No intracranial mass effect or midline shift. Basilar cisterns remain normal. Gray-white matter differentiation is within normal limits throughout the brain. No cortically based acute infarct identified. Vascular: No suspicious intracranial vascular hyperdensity. Skull: No skull fracture identified. Other: Large right anterior convexity scalp hematoma up to 2.3 centimeters in thickness with superimposed small volume scalp soft tissue gas. Other scalp soft tissues are within normal limits. CT MAXILLOFACIAL FINDINGS Osseous: Mandible intact. Maxilla and zygoma intact. Nasal bones within normal limits. No acute facial fracture identified. Orbits: Orbital walls intact. Orbit soft tissues are within normal limits. Sinuses: Well pneumatized. There is fluid or debris in the right external auditory canal, but the right tympanic membrane and mastoids remain clear. Left middle ear and mastoids are clear. Soft tissues: Fluid in the pharynx in the setting of intubation. ET tube and oral enteric tube in place. Negative noncontrast deep soft tissue spaces of the face. CT CERVICAL SPINE FINDINGS Alignment: Mild straightening of cervical lordosis. Cervicothoracic junction alignment is within normal limits. Bilateral posterior element alignment is within normal limits. Skull base and vertebrae: Visualized skull base is intact. No atlanto-occipital dissociation. No cervical  spine fracture. Soft tissues and spinal canal: No prevertebral fluid or swelling. No visible canal hematoma. Endotracheal tube and enteric tube course appropriately into the trachea and upper esophagus. Disc levels:  Minimal degenerative changes. Upper chest: Negative lung apices. Visible upper thoracic levels appear intact. IMPRESSION: 1. Small to moderate volume of presumed post-traumatic Subarachnoid Hemorrhage along both Sylvian fissures and the cerebral convexities, more so on the right. The basilar cisterns are spared. 2. Superimposed small volume right lateral Intraventricular Hemorrhage. No ventriculomegaly. 3. No cerebral hemorrhagic contusion identified. No intracranial mass effect. No acute cortically based infarct. 4. Large right anterior convexity scalp hematoma but no underlying skull fracture identified. 5. The above critical value/emergent results were discussed by telephone with Dr. Andrey Campanile of Trauma Surgery at 2016 hours on 02/24/2018. 6. No face or cervical spine fracture. No other acute traumatic injury identified. Electronically Signed   By: Odessa Fleming M.D.   On: 02/24/2018 20:32   Ct Cervical Spine Wo Contrast  Result Date: 02/24/2018 CLINICAL DATA:  Female of unknown age found down. Unknown exact trauma mechanism. Seizing, altered mental status. Intubated for airway protection. EXAM: CT HEAD WITHOUT CONTRAST CT MAXILLOFACIAL WITHOUT CONTRAST CT CERVICAL SPINE WITHOUT CONTRAST TECHNIQUE: Multidetector CT imaging of the head, cervical spine, and maxillofacial structures were performed using the standard protocol without intravenous contrast. Multiplanar CT image reconstructions of the cervical spine and maxillofacial structures were also generated. COMPARISON:  Portable chest 1938 hours today. FINDINGS: CT HEAD FINDINGS Brain: Small to moderate volume of subarachnoid hemorrhage along both sylvian fissures and the bilateral superior and lateral convexities. The basilar cisterns are spared. There  is slightly more blood along the right sylvian fissure and hemisphere. Superimposed small volume of right lateral intraventricular hemorrhage. No subdural hemorrhage identified. No ventriculomegaly. No intracranial mass effect or midline shift. Basilar cisterns remain normal. Gray-white matter differentiation is within normal limits throughout the brain. No cortically based  acute infarct identified. Vascular: No suspicious intracranial vascular hyperdensity. Skull: No skull fracture identified. Other: Large right anterior convexity scalp hematoma up to 2.3 centimeters in thickness with superimposed small volume scalp soft tissue gas. Other scalp soft tissues are within normal limits. CT MAXILLOFACIAL FINDINGS Osseous: Mandible intact. Maxilla and zygoma intact. Nasal bones within normal limits. No acute facial fracture identified. Orbits: Orbital walls intact. Orbit soft tissues are within normal limits. Sinuses: Well pneumatized. There is fluid or debris in the right external auditory canal, but the right tympanic membrane and mastoids remain clear. Left middle ear and mastoids are clear. Soft tissues: Fluid in the pharynx in the setting of intubation. ET tube and oral enteric tube in place. Negative noncontrast deep soft tissue spaces of the face. CT CERVICAL SPINE FINDINGS Alignment: Mild straightening of cervical lordosis. Cervicothoracic junction alignment is within normal limits. Bilateral posterior element alignment is within normal limits. Skull base and vertebrae: Visualized skull base is intact. No atlanto-occipital dissociation. No cervical spine fracture. Soft tissues and spinal canal: No prevertebral fluid or swelling. No visible canal hematoma. Endotracheal tube and enteric tube course appropriately into the trachea and upper esophagus. Disc levels:  Minimal degenerative changes. Upper chest: Negative lung apices. Visible upper thoracic levels appear intact. IMPRESSION: 1. Small to moderate volume of  presumed post-traumatic Subarachnoid Hemorrhage along both Sylvian fissures and the cerebral convexities, more so on the right. The basilar cisterns are spared. 2. Superimposed small volume right lateral Intraventricular Hemorrhage. No ventriculomegaly. 3. No cerebral hemorrhagic contusion identified. No intracranial mass effect. No acute cortically based infarct. 4. Large right anterior convexity scalp hematoma but no underlying skull fracture identified. 5. The above critical value/emergent results were discussed by telephone with Dr. Andrey Campanile of Trauma Surgery at 2016 hours on 02/24/2018. 6. No face or cervical spine fracture. No other acute traumatic injury identified. Electronically Signed   By: Odessa Fleming M.D.   On: 02/24/2018 20:32   Dg Chest Port 1 View  Result Date: 02/26/2018 CLINICAL DATA:  Follow-up pneumonia. EXAM: PORTABLE CHEST 1 VIEW COMPARISON:  02/25/2018 and 02/24/2018. FINDINGS: Since prior studies, bilateral hazy perihilar opacities, left greater than right, have developed, along with interstitial prominence mild vascular congestion. No gross ORIF a pleural effusion on this supine exam. Cardiac silhouette is normal in size. No mediastinal or hilar masses. Previous day's study, the endotracheal tube/orogastric tube have been removed IMPRESSION: 1. Interval worsening of lung aeration when compared prior exams. However, the current exam is supine and the previous day's exam with semi-erect. This position change may account for some of the apparent radiographic change. Current exam shows hazy bilateral central lung opacities, greater on the left, and vascular/interstitial prominence. The hazy opacities could reflect layering pleural effusions or could be due to atelectasis or asymmetric edema. The single day change argues against pneumonia. Electronically Signed   By: Amie Portland M.D.   On: 02/26/2018 11:11   Dg Chest Port 1 View  Result Date: 02/25/2018 CLINICAL DATA:  Increased oro  pharyngeal secretions EXAM: PORTABLE CHEST 1 VIEW COMPARISON:  02/24/2018 FINDINGS: Endotracheal tube and NG tube remain in place, unchanged. Patchy right infrahilar airspace opacity is new since prior study concerning for infiltrate/pneumonia. No confluent opacity on the left. Heart is borderline in size. No effusions. IMPRESSION: Increasing right infrahilar airspace opacity concerning for pneumonia. Electronically Signed   By: Charlett Nose M.D.   On: 02/25/2018 08:46   Dg Chest Port 1 View  Result Date: 02/24/2018 CLINICAL DATA:  Seizure with fall. Intubation and OG tube placement. EXAM: PORTABLE CHEST 1 VIEW COMPARISON:  None. FINDINGS: Endotracheal tube terminates at the level of the clavicles. Enteric tube courses into the stomach with tip below the inferior margin of the image. Tubing projecting over the lateral right chest is presumably external to the patient. The cardiomediastinal silhouette is within normal limits. No airspace consolidation, edema, pleural effusion, pneumothorax is identified. No acute osseous abnormality is identified. IMPRESSION: Support devices as above. No evidence of acute airspace disease. Electronically Signed   By: Sebastian AcheAllen  Grady M.D.   On: 02/24/2018 20:04   Ct Maxillofacial Wo Contrast  Result Date: 02/24/2018 CLINICAL DATA:  Female of unknown age found down. Unknown exact trauma mechanism. Seizing, altered mental status. Intubated for airway protection. EXAM: CT HEAD WITHOUT CONTRAST CT MAXILLOFACIAL WITHOUT CONTRAST CT CERVICAL SPINE WITHOUT CONTRAST TECHNIQUE: Multidetector CT imaging of the head, cervical spine, and maxillofacial structures were performed using the standard protocol without intravenous contrast. Multiplanar CT image reconstructions of the cervical spine and maxillofacial structures were also generated. COMPARISON:  Portable chest 1938 hours today. FINDINGS: CT HEAD FINDINGS Brain: Small to moderate volume of subarachnoid hemorrhage along both sylvian  fissures and the bilateral superior and lateral convexities. The basilar cisterns are spared. There is slightly more blood along the right sylvian fissure and hemisphere. Superimposed small volume of right lateral intraventricular hemorrhage. No subdural hemorrhage identified. No ventriculomegaly. No intracranial mass effect or midline shift. Basilar cisterns remain normal. Gray-white matter differentiation is within normal limits throughout the brain. No cortically based acute infarct identified. Vascular: No suspicious intracranial vascular hyperdensity. Skull: No skull fracture identified. Other: Large right anterior convexity scalp hematoma up to 2.3 centimeters in thickness with superimposed small volume scalp soft tissue gas. Other scalp soft tissues are within normal limits. CT MAXILLOFACIAL FINDINGS Osseous: Mandible intact. Maxilla and zygoma intact. Nasal bones within normal limits. No acute facial fracture identified. Orbits: Orbital walls intact. Orbit soft tissues are within normal limits. Sinuses: Well pneumatized. There is fluid or debris in the right external auditory canal, but the right tympanic membrane and mastoids remain clear. Left middle ear and mastoids are clear. Soft tissues: Fluid in the pharynx in the setting of intubation. ET tube and oral enteric tube in place. Negative noncontrast deep soft tissue spaces of the face. CT CERVICAL SPINE FINDINGS Alignment: Mild straightening of cervical lordosis. Cervicothoracic junction alignment is within normal limits. Bilateral posterior element alignment is within normal limits. Skull base and vertebrae: Visualized skull base is intact. No atlanto-occipital dissociation. No cervical spine fracture. Soft tissues and spinal canal: No prevertebral fluid or swelling. No visible canal hematoma. Endotracheal tube and enteric tube course appropriately into the trachea and upper esophagus. Disc levels:  Minimal degenerative changes. Upper chest: Negative  lung apices. Visible upper thoracic levels appear intact. IMPRESSION: 1. Small to moderate volume of presumed post-traumatic Subarachnoid Hemorrhage along both Sylvian fissures and the cerebral convexities, more so on the right. The basilar cisterns are spared. 2. Superimposed small volume right lateral Intraventricular Hemorrhage. No ventriculomegaly. 3. No cerebral hemorrhagic contusion identified. No intracranial mass effect. No acute cortically based infarct. 4. Large right anterior convexity scalp hematoma but no underlying skull fracture identified. 5. The above critical value/emergent results were discussed by telephone with Dr. Andrey CampanileWilson of Trauma Surgery at 2016 hours on 02/24/2018. 6. No face or cervical spine fracture. No other acute traumatic injury identified. Electronically Signed   By: Odessa FlemingH  Hall M.D.   On: 02/24/2018 20:32  Assessment/Plan: Manic subarachnoid hemorrhage, intraventricular hemorrhage: The patient is improving clinically.  I will continue to follow her.  LOS: 2 days     Cristi Loron 02/26/2018, 3:34 PM

## 2018-02-26 NOTE — Procedures (Signed)
Cortrak  Person Inserting Tube:  Makenli Derstine, RD Tube Type:  Cortrak - 43 inches Tube Location:  Left nare Initial Placement:  Stomach Secured by: Bridle Technique Used to Measure Tube Placement:  Documented cm marking at nare/ corner of mouth Cortrak Secured At:  73 cm Procedure Comments:  Cortrak Tube Team Note:  Consult received to place a Cortrak feeding tube.   No x-ray is required. RN may begin using tube.   If the tube becomes dislodged please keep the tube and contact the Cortrak team at [email or web address] ([email or web address]) for replacement.  If after hours and replacement cannot be delayed, place a NG tube and confirm placement with an abdominal x-ray.    Obdulio Mash MS, RD, LDN, CNSC [phone] Pager  [phone] Weekend/On-Call Pager      

## 2018-02-26 NOTE — Progress Notes (Signed)
PT Cancellation Note  Patient Details Name: Alyssa LacyChristina Danielle Little MRN: [id number] DOB: 08/10/1980   Cancelled Treatment:    Reason Eval/Treat Not Completed: Medical issues which prohibited therapy.  Pt just given ativan and RR in the 50s.  PT to hold today and check back tomorrow.  Thanks,  Rollene Rotundaebecca B. Dorn Hartshorne, PT, DPT  Acute Rehabilitation [phone] pager [phone] office   Alyssa JoinerRebecca B [name] 02/26/2018, 3:59 PM

## 2018-02-26 NOTE — Progress Notes (Signed)
CRITICAL VALUE ALERT  Critical Value: Phosphorous <1.0  Date & Time Notied:  02/26/18 1715  Provider Notified: Dr. Lindie SpruceWyatt  Orders Received/Actions taken: New order for sodium phosphate

## 2018-02-26 NOTE — Progress Notes (Signed)
SLP Cancellation Note  Patient Details Name: Alyssa LacyChristina Danielle Little MRN: [id number] DOB: 05/11/1980   Cancelled treatment:       Reason Eval/Treat Not Completed: Other (comment) Pt sedated.  Will continue efforts.   Blenda MountsCouture, Shaquan Missey Laurice 02/26/2018, 1:31 PM

## 2018-02-26 NOTE — Progress Notes (Signed)
Reason for consult: Status epilepticus  Subjective: Patient is awake following commands.  Further seizure activity noted by nurses.   ROS: negative except above  Examination  Vital signs in last 24 hours: Temp:  [100.2 F (37.9 C)-101.7 F (38.7 C)] 101.1 F (38.4 C) (11/13 2000) Pulse Rate:  [48-137] 129 (11/13 2000) Resp:  [19-47] 21 (11/13 2000) BP: (88-128)/(68-99) 116/91 (11/13 2000) SpO2:  [91 %-100 %] 100 % (11/13 2000)  General: lying in bed CVS: pulse-normal rate and rhythm RS: breathing comfortably Extremities: normal   Neuro: MS: Alert, oriented, follows commands CN: pupils equal and reactive,  EOMI, face symmetric, tongue midline, normal sensation over face, Motor: 4/5 strength in all 4 extremities Coordination: normal Gait: not tested  Basic Metabolic Panel: Recent Labs  Lab 02/24/18 1940 02/24/18 1946 02/25/18 0420 02/26/18 0303 02/26/18 1519  NA 137 136 134* 135  --   K 3.3* 3.5 4.3 2.9*  --   CL 99 102 106 108  --   CO2 13*  --  16* 18*  --   GLUCOSE 146* 146* 103* 98  --   BUN 5* 4* <5* <5*  --   CREATININE 0.66 0.40* 0.53 0.55  --   CALCIUM 8.3*  --  5.8* 6.3*  --   MG  --   --   --   --  1.1*  PHOS  --   --   --   --  <1.0*    CBC: Recent Labs  Lab 02/24/18 1940 02/24/18 1946 02/25/18 0420 02/25/18 1859 02/26/18 0303  WBC 13.7*  --  8.6 14.0* 10.0  HGB 11.6* 13.3 7.7* 13.0 12.2  HCT 37.3 39.0 24.0* 38.1 36.6  MCV 118.0*  --  114.8* 96.5 95.1  PLT 177  --  122* 107* 104*     Coagulation Studies: Recent Labs    02/24/18 1940 02/25/18 0420  LABPROT 13.9 14.4  INR 1.08 1.13      Impression: This EEG is indicative of: 1. 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 There is a periodic pattern of epileptiform discharges ("PLEDs") which continues to wane in severity. 2. A mild to moderate diffuse encephalopathy.    ASSESSMENT AND PLAN  37 year old female with traumatic subarachnoid hemorrhage and  seizures.  She has been on continuous EEG which shows frequent complaints and few runs of rhythmic activity concerning for partial status. EEG has subsequently improved and patient is more alert and cooperative.  Traumatic brain injury Partial status epilepticus - resolved  Plan Discontinue EEG tomorrow Continue Keppra, Dilantin and Vimpat Continue seizure precautions MRI brain when stable    Sushanth Aroor [facility] Pager Number [phone] For questions after 7pm please refer to AMION to reach the Neurologist on call

## 2018-02-26 NOTE — Progress Notes (Signed)
OT Cancellation Note  Patient Details Name: Alyssa LacyChristina Danielle Little MRN: [id number] DOB: 07/07/1980   Cancelled Treatment:    Reason Eval/Treat Not Completed: Patient at procedure or test/ unavailable(having continuous EEG; will attempt to assess tomorrow ) if appropriate.   Thornell MuleWARD,HILLARY  Liat Mayol, OT/L   Acute OT Clinical Specialist Acute Rehabilitation Services Pager [phone] Office [phone]  02/26/2018, 3:25 PM

## 2018-02-26 NOTE — Progress Notes (Signed)
Patient ID: Alyssa Little, female   DOB: [birth year], 37 y.o.   MRN: [id number]       Subjective: Patient laying in bed and somewhat agitated at times.  She has mittens in place.  Aunt and uncle from [place] are currently here.  They state mom and dad are not in the picture.  RN states patient was not following commands before ativan today.  She will not follow commands for me during my eval.  Objective: Vital signs in last 24 hours: Temp:  [97.7 F (36.5 C)-101.7 F (38.7 C)] 100.6 F (38.1 C) (11/13 1000) Pulse Rate:  [78-148] 120 (11/13 1000) Resp:  [18-40] 36 (11/13 1000) BP: (77-125)/(63-100) 113/93 (11/13 1000) SpO2:  [82 %-100 %] 100 % (11/13 1000) Last BM Date: (PTA)  Intake/Output from previous day: 11/12 0701 - 11/13 0700 In: 3358 [I.V.:2419.3; Blood:403.3; IV Piggyback:535.3] Out: 4750 [Urine:4750] Intake/Output this shift: No intake/output data recorded.  PE: Gen: mostly sedated secondary to ativan, but will open her eyes to her name called loudly.  Otherwise agitated and kicks sheets with her feet. HEENT: all lacerations are sutured and appear clean.  EEG leads in place.  Collar in place Heart: tachy in 120s Lungs: bilateral rhonchi, tachypnea in 40s Abd: soft, ND Ext: pedal pulses present.  Hands in mittens. Neuro: she does not follow commands today, but does arouse to her name, but doesn't speak while I'm in there  Lab Results:  Recent Labs    02/25/18 1859 02/26/18 0303  WBC 14.0* 10.0  HGB 13.0 12.2  HCT 38.1 36.6  PLT 107* 104*   BMET Recent Labs    02/25/18 0420 02/26/18 0303  NA 134* 135  K 4.3 2.9*  CL 106 108  CO2 16* 18*  GLUCOSE 103* 98  BUN <5* <5*  CREATININE 0.53 0.55  CALCIUM 5.8* 6.3*   PT/INR Recent Labs    02/24/18 1940 02/25/18 0420  LABPROT 13.9 14.4  INR 1.08 1.13   CMP     Component Value Date/Time   NA 135 02/26/2018 0303   K 2.9 (L) 02/26/2018 0303   CL 108 02/26/2018 0303   CO2 18 (L) 02/26/2018 0303   GLUCOSE 98 02/26/2018 0303   BUN <5 (L) 02/26/2018 0303   CREATININE 0.55 02/26/2018 0303   CALCIUM 6.3 (LL) 02/26/2018 0303   PROT 5.2 (L) 02/25/2018 0420   ALBUMIN 2.6 (L) 02/25/2018 0420   AST 182 (H) 02/25/2018 0420   ALT 58 (H) 02/25/2018 0420   ALKPHOS 92 02/25/2018 0420   BILITOT 1.5 (H) 02/25/2018 0420   GFRNONAA >60 02/26/2018 0303   GFRAA >60 02/26/2018 0303   Lipase  No results found for: LIPASE     Studies/Results: Ct Head Wo Contrast  Result Date: 02/25/2018 CLINICAL DATA:  Follow-up examination for intracranial hemorrhage. EXAM: CT HEAD WITHOUT CONTRAST TECHNIQUE: Contiguous axial images were obtained from the base of the skull through the vertex without intravenous contrast. COMPARISON:  Prior CT from 02/24/2018. FINDINGS: Brain: Previously seen moderate volume subarachnoid hemorrhage is slightly less conspicuous as compared to previous exam, overall likely slightly decreased. Hemorrhage as re-distributed from the sylvian fissures, now more prominent layering along several cortical sulci at the posterior cerebral hemispheres. Overall, hemorrhages greater within the right cerebral hemisphere as compared to the left. Increased hemorrhage seen layering within the occipital horns of both lateral ventricles, compatible with redistribution. No hydrocephalus or ventricular trapping. Basilar cisterns remain patent. No midline shift. Evolving small cortical contusions at the high  anterior frontal lobes bilaterally, slightly increased in size from previous. Largest of these seen on the right and measures 1 cm. No significant mass effect. No other new acute intracranial hemorrhage. No acute large vessel territory infarct. No extra-axial fluid collection. Vascular: No hyperdense vessel. Skull: Evolving right-sided scalp contusion, decreased in size from previous. Calvarium unchanged. Sinuses/Orbits: Globes and orbital soft tissues demonstrate no acute finding. Mild scattered mucosal  thickening within the ethmoidal air cells and maxillary sinuses. Paranasal sinuses are otherwise clear. Mastoid air cells remain largely clear. Other: None. IMPRESSION: 1. Interval decrease in moderate volume subarachnoid hemorrhage as compared to previous exam. Increased intraventricular blood consistent with redistribution. No hydrocephalus or ventricular trapping. 2. Evolving small bifrontal hemorrhagic contusions as above, right slightly larger than left. No significant mass effect. 3. No other new acute intracranial abnormality. 4. Interval decrease in large right right-sided scalp contusion. Electronically Signed   By: Rise Mu M.D.   On: 02/25/2018 05:59   Ct Head Wo Contrast  Result Date: 02/24/2018 CLINICAL DATA:  Female of unknown age found down. Unknown exact trauma mechanism. Seizing, altered mental status. Intubated for airway protection. EXAM: CT HEAD WITHOUT CONTRAST CT MAXILLOFACIAL WITHOUT CONTRAST CT CERVICAL SPINE WITHOUT CONTRAST TECHNIQUE: Multidetector CT imaging of the head, cervical spine, and maxillofacial structures were performed using the standard protocol without intravenous contrast. Multiplanar CT image reconstructions of the cervical spine and maxillofacial structures were also generated. COMPARISON:  Portable chest 1938 hours today. FINDINGS: CT HEAD FINDINGS Brain: Small to moderate volume of subarachnoid hemorrhage along both sylvian fissures and the bilateral superior and lateral convexities. The basilar cisterns are spared. There is slightly more blood along the right sylvian fissure and hemisphere. Superimposed small volume of right lateral intraventricular hemorrhage. No subdural hemorrhage identified. No ventriculomegaly. No intracranial mass effect or midline shift. Basilar cisterns remain normal. Gray-white matter differentiation is within normal limits throughout the brain. No cortically based acute infarct identified. Vascular: No suspicious intracranial  vascular hyperdensity. Skull: No skull fracture identified. Other: Large right anterior convexity scalp hematoma up to 2.3 centimeters in thickness with superimposed small volume scalp soft tissue gas. Other scalp soft tissues are within normal limits. CT MAXILLOFACIAL FINDINGS Osseous: Mandible intact. Maxilla and zygoma intact. Nasal bones within normal limits. No acute facial fracture identified. Orbits: Orbital walls intact. Orbit soft tissues are within normal limits. Sinuses: Well pneumatized. There is fluid or debris in the right external auditory canal, but the right tympanic membrane and mastoids remain clear. Left middle ear and mastoids are clear. Soft tissues: Fluid in the pharynx in the setting of intubation. ET tube and oral enteric tube in place. Negative noncontrast deep soft tissue spaces of the face. CT CERVICAL SPINE FINDINGS Alignment: Mild straightening of cervical lordosis. Cervicothoracic junction alignment is within normal limits. Bilateral posterior element alignment is within normal limits. Skull base and vertebrae: Visualized skull base is intact. No atlanto-occipital dissociation. No cervical spine fracture. Soft tissues and spinal canal: No prevertebral fluid or swelling. No visible canal hematoma. Endotracheal tube and enteric tube course appropriately into the trachea and upper esophagus. Disc levels:  Minimal degenerative changes. Upper chest: Negative lung apices. Visible upper thoracic levels appear intact. IMPRESSION: 1. Small to moderate volume of presumed post-traumatic Subarachnoid Hemorrhage along both Sylvian fissures and the cerebral convexities, more so on the right. The basilar cisterns are spared. 2. Superimposed small volume right lateral Intraventricular Hemorrhage. No ventriculomegaly. 3. No cerebral hemorrhagic contusion identified. No intracranial mass effect. No acute cortically  based infarct. 4. Large right anterior convexity scalp hematoma but no underlying skull  fracture identified. 5. The above critical value/emergent results were discussed by telephone with Dr. Andrey Campanile of Trauma Surgery at 2016 hours on 02/24/2018. 6. No face or cervical spine fracture. No other acute traumatic injury identified. Electronically Signed   By: Odessa Fleming M.D.   On: 02/24/2018 20:32   Ct Cervical Spine Wo Contrast  Result Date: 02/24/2018 CLINICAL DATA:  Female of unknown age found down. Unknown exact trauma mechanism. Seizing, altered mental status. Intubated for airway protection. EXAM: CT HEAD WITHOUT CONTRAST CT MAXILLOFACIAL WITHOUT CONTRAST CT CERVICAL SPINE WITHOUT CONTRAST TECHNIQUE: Multidetector CT imaging of the head, cervical spine, and maxillofacial structures were performed using the standard protocol without intravenous contrast. Multiplanar CT image reconstructions of the cervical spine and maxillofacial structures were also generated. COMPARISON:  Portable chest 1938 hours today. FINDINGS: CT HEAD FINDINGS Brain: Small to moderate volume of subarachnoid hemorrhage along both sylvian fissures and the bilateral superior and lateral convexities. The basilar cisterns are spared. There is slightly more blood along the right sylvian fissure and hemisphere. Superimposed small volume of right lateral intraventricular hemorrhage. No subdural hemorrhage identified. No ventriculomegaly. No intracranial mass effect or midline shift. Basilar cisterns remain normal. Gray-white matter differentiation is within normal limits throughout the brain. No cortically based acute infarct identified. Vascular: No suspicious intracranial vascular hyperdensity. Skull: No skull fracture identified. Other: Large right anterior convexity scalp hematoma up to 2.3 centimeters in thickness with superimposed small volume scalp soft tissue gas. Other scalp soft tissues are within normal limits. CT MAXILLOFACIAL FINDINGS Osseous: Mandible intact. Maxilla and zygoma intact. Nasal bones within normal limits. No  acute facial fracture identified. Orbits: Orbital walls intact. Orbit soft tissues are within normal limits. Sinuses: Well pneumatized. There is fluid or debris in the right external auditory canal, but the right tympanic membrane and mastoids remain clear. Left middle ear and mastoids are clear. Soft tissues: Fluid in the pharynx in the setting of intubation. ET tube and oral enteric tube in place. Negative noncontrast deep soft tissue spaces of the face. CT CERVICAL SPINE FINDINGS Alignment: Mild straightening of cervical lordosis. Cervicothoracic junction alignment is within normal limits. Bilateral posterior element alignment is within normal limits. Skull base and vertebrae: Visualized skull base is intact. No atlanto-occipital dissociation. No cervical spine fracture. Soft tissues and spinal canal: No prevertebral fluid or swelling. No visible canal hematoma. Endotracheal tube and enteric tube course appropriately into the trachea and upper esophagus. Disc levels:  Minimal degenerative changes. Upper chest: Negative lung apices. Visible upper thoracic levels appear intact. IMPRESSION: 1. Small to moderate volume of presumed post-traumatic Subarachnoid Hemorrhage along both Sylvian fissures and the cerebral convexities, more so on the right. The basilar cisterns are spared. 2. Superimposed small volume right lateral Intraventricular Hemorrhage. No ventriculomegaly. 3. No cerebral hemorrhagic contusion identified. No intracranial mass effect. No acute cortically based infarct. 4. Large right anterior convexity scalp hematoma but no underlying skull fracture identified. 5. The above critical value/emergent results were discussed by telephone with Dr. Andrey Campanile of Trauma Surgery at 2016 hours on 02/24/2018. 6. No face or cervical spine fracture. No other acute traumatic injury identified. Electronically Signed   By: Odessa Fleming M.D.   On: 02/24/2018 20:32   Dg Chest Port 1 View  Result Date: 02/25/2018 CLINICAL  DATA:  Increased oro pharyngeal secretions EXAM: PORTABLE CHEST 1 VIEW COMPARISON:  02/24/2018 FINDINGS: Endotracheal tube and NG tube remain in place, unchanged.  Patchy right infrahilar airspace opacity is new since prior study concerning for infiltrate/pneumonia. No confluent opacity on the left. Heart is borderline in size. No effusions. IMPRESSION: Increasing right infrahilar airspace opacity concerning for pneumonia. Electronically Signed   By: Charlett NoseKevin  Dover M.D.   On: 02/25/2018 08:46   Dg Chest Port 1 View  Result Date: 02/24/2018 CLINICAL DATA:  Seizure with fall. Intubation and OG tube placement. EXAM: PORTABLE CHEST 1 VIEW COMPARISON:  None. FINDINGS: Endotracheal tube terminates at the level of the clavicles. Enteric tube courses into the stomach with tip below the inferior margin of the image. Tubing projecting over the lateral right chest is presumably external to the patient. The cardiomediastinal silhouette is within normal limits. No airspace consolidation, edema, pleural effusion, pneumothorax is identified. No acute osseous abnormality is identified. IMPRESSION: Support devices as above. No evidence of acute airspace disease. Electronically Signed   By: Sebastian AcheAllen  Grady M.D.   On: 02/24/2018 20:04   Ct Maxillofacial Wo Contrast  Result Date: 02/24/2018 CLINICAL DATA:  Female of unknown age found down. Unknown exact trauma mechanism. Seizing, altered mental status. Intubated for airway protection. EXAM: CT HEAD WITHOUT CONTRAST CT MAXILLOFACIAL WITHOUT CONTRAST CT CERVICAL SPINE WITHOUT CONTRAST TECHNIQUE: Multidetector CT imaging of the head, cervical spine, and maxillofacial structures were performed using the standard protocol without intravenous contrast. Multiplanar CT image reconstructions of the cervical spine and maxillofacial structures were also generated. COMPARISON:  Portable chest 1938 hours today. FINDINGS: CT HEAD FINDINGS Brain: Small to moderate volume of subarachnoid  hemorrhage along both sylvian fissures and the bilateral superior and lateral convexities. The basilar cisterns are spared. There is slightly more blood along the right sylvian fissure and hemisphere. Superimposed small volume of right lateral intraventricular hemorrhage. No subdural hemorrhage identified. No ventriculomegaly. No intracranial mass effect or midline shift. Basilar cisterns remain normal. Gray-white matter differentiation is within normal limits throughout the brain. No cortically based acute infarct identified. Vascular: No suspicious intracranial vascular hyperdensity. Skull: No skull fracture identified. Other: Large right anterior convexity scalp hematoma up to 2.3 centimeters in thickness with superimposed small volume scalp soft tissue gas. Other scalp soft tissues are within normal limits. CT MAXILLOFACIAL FINDINGS Osseous: Mandible intact. Maxilla and zygoma intact. Nasal bones within normal limits. No acute facial fracture identified. Orbits: Orbital walls intact. Orbit soft tissues are within normal limits. Sinuses: Well pneumatized. There is fluid or debris in the right external auditory canal, but the right tympanic membrane and mastoids remain clear. Left middle ear and mastoids are clear. Soft tissues: Fluid in the pharynx in the setting of intubation. ET tube and oral enteric tube in place. Negative noncontrast deep soft tissue spaces of the face. CT CERVICAL SPINE FINDINGS Alignment: Mild straightening of cervical lordosis. Cervicothoracic junction alignment is within normal limits. Bilateral posterior element alignment is within normal limits. Skull base and vertebrae: Visualized skull base is intact. No atlanto-occipital dissociation. No cervical spine fracture. Soft tissues and spinal canal: No prevertebral fluid or swelling. No visible canal hematoma. Endotracheal tube and enteric tube course appropriately into the trachea and upper esophagus. Disc levels:  Minimal degenerative  changes. Upper chest: Negative lung apices. Visible upper thoracic levels appear intact. IMPRESSION: 1. Small to moderate volume of presumed post-traumatic Subarachnoid Hemorrhage along both Sylvian fissures and the cerebral convexities, more so on the right. The basilar cisterns are spared. 2. Superimposed small volume right lateral Intraventricular Hemorrhage. No ventriculomegaly. 3. No cerebral hemorrhagic contusion identified. No intracranial mass effect. No acute cortically  based infarct. 4. Large right anterior convexity scalp hematoma but no underlying skull fracture identified. 5. The above critical value/emergent results were discussed by telephone with Dr. Andrey Campanile of Trauma Surgery at 2016 hours on 02/24/2018. 6. No face or cervical spine fracture. No other acute traumatic injury identified. Electronically Signed   By: Odessa Fleming M.D.   On: 02/24/2018 20:32    Anti-infectives: Anti-infectives (From admission, onward)   Start     Dose/Rate Route Frequency Ordered Stop   02/26/18 1200  piperacillin-tazobactam (ZOSYN) IVPB 3.375 g     3.375 g 12.5 mL/hr over 240 Minutes Intravenous Every 8 hours 02/26/18 1001         Assessment/Plan Fall TBI/SAH/B F ICC/IVH - Dr. Lovell Sheehan following, TBI team therapies, [place] currently.  Unable to eat on her own.  Will have to place a Cortrak for nutrition Seizure likely due to above - Keppra, continuous EEG, neurology following Complex scalp lac - S/P closure 11/11 by Dr. Andrey Campanile Acute hypoxic ventilator dependent respiratory failure - extubated, but with tachypnea.  CXR yesterday revealed PNA, respiratory CX pending ABL anemia - hgb 12 CV - tachycardia, on lopressor Alcohol abuse disorder - friend reports she drinks "2 cups" brandy daily, on CIWA, timing increased to help with agitation. PNA - repeat CXR today given rhonchi bilaterally.  Temp overnight of 101.7. Respiratory CX.  Empirically start zosyn FEN - Cortrak for TFs as she is unable to take in  nutrition due to AMS VTE - PAS Dispo - ICU I spoke with her Aunt and Uncle who have arrived from [place].   LOS: 2 days    Letha Cape , [facility] 02/26/2018, 10:33 AM Pager: [phone]

## 2018-02-26 NOTE — Progress Notes (Signed)
Initial Nutrition Assessment  DOCUMENTATION CODES:   Non-severe (moderate) malnutrition in context of chronic illness  INTERVENTION:   Once Cortrak tube is placed Pivot 1.5 @ 25 ml/hr and increase by 10 ml every 12 hours to goal rate of 45 ml/hr (1080 ml/day)  Provides: 1620 kcal, 101 grams protein, and 819 ml free water.   Monitor magnesium and phosphorus every 12 hours for at least 4 occurances, MD to replete as needed, as pt is at risk for refeeding syndrome given moderate malnutrition.  NUTRITION DIAGNOSIS:   Moderate Malnutrition related to chronic illness(possible GI illness/ETOH abuse) as evidenced by mild fat depletion, moderate muscle depletion.  GOAL:   Patient will meet greater than or equal to 90% of their needs  MONITOR:   Diet advancement, Labs, TF tolerance  REASON FOR ASSESSMENT:   Consult Enteral/tube feeding initiation and management  ASSESSMENT:   Pt with PMH of ETOH abuse (drinks 2 cups of brandy daily) now admitted s/p fall with TBI, SAH, B ICC, IVH, complex scalp lac s/p closure 11/11, and seizures.    Pt discussed during ICU rounds and with RN.  Pt not following commands and is requiring Ativan for agitation Plan for cortrak placement today  Spoke with aunt, who [place], and she reports that her guy friend has reported that pt has been losing weight since June after a GI illness (confirmed through [employer]). Aunt confirms that pt looks like she has lost a lot of weight since she last saw her. Per friend pt has been throwing up but no other details available about this.  Nutrition intake and weight trends unknown at this time.  Per aunt she was told by friend that she has been drinking after work. Usually 2 glasses of wine and some brandy. Aunt notes that the patient's family hx includes ETOH abuse.   Confirmed with Trauma PA, ok to start TF after Cortrak placed.   Medications reviewed and include: folic acid, MVI, thiamine  NS with 20 mEq KCl/L  @ 100 ml/hr 10 mEq KCl every hr x 6 Labs reviewed: K+ 2.9 (L)   UOP: 4750 ml x 24 hrs   NUTRITION - FOCUSED PHYSICAL EXAM:    Most Recent Value  Orbital Region  Unable to assess [edema]  Upper Arm Region  Mild depletion  Thoracic and Lumbar Region  Mild depletion  Buccal Region  Unable to assess [edema]  Temple Region  Unable to assess [edema]  Clavicle Bone Region  No depletion  Clavicle and Acromion Bone Region  Mild depletion  Scapular Bone Region  No depletion  Dorsal Hand  Unable to assess  Patellar Region  Moderate depletion  Anterior Thigh Region  Moderate depletion  Posterior Calf Region  Moderate depletion  Edema (RD Assessment)  -- [facial]  Hair  Reviewed  Eyes  Unable to assess  Mouth  Unable to assess  Skin  Reviewed  Nails  Unable to assess       Diet Order:   Diet Order            Diet NPO time specified  Diet effective now              EDUCATION NEEDS:   No education needs have been identified at this time  Skin:  Skin Assessment: Reviewed RN Assessment  Last BM:  unknown  Height:   Ht Readings from Last 1 Encounters:  02/24/18 5\' 1"  (1.549 m)    Weight:   Wt Readings from Last  1 Encounters:  02/24/18 54.4 kg    Ideal Body Weight:  47.7 kg  BMI:  Body mass index is 22.67 kg/m.  Estimated Nutritional Needs:   Kcal:  1550-1800  Protein:  77-92 grams  Fluid:  > 1.5 L/day  Kendell Bane RD, LDN, CNSC [phone] Pager [phone] After Hours Pager

## 2018-02-26 NOTE — Procedures (Signed)
   [facility] __________________  Video EEG Monitoring Report     Dates of recording: 02/25/2018 @11 :04 to 02/26/2018 @10 :00   Recording day: 2 (started on 11/12)   Interpreting physician: Delbert Phenixan-Andrei Tymesha Ditmore, MD       CPT: [phone]   ICD-10: G40.901          Present History: 37 year old woman with traumatic SAH and seizures. Continuous VEEG requested to evaluate for seizures.  EEG Details: Continuous video EEG was performed using standard setting per the guidelines of American Clinical Neurophysiology Society (ACNS). A minimum of 21 electrodes were placed on scalp according to the International 10-20 or 10-10 system. Supplemental electrodes were placed as needed. Single EKG electrode was also used to detect cardiac arrhythmia. Recording was performed at a sampling rate of at least 256 Hz. Patient's behavior was continuously recorded on video simultaneously with EEG. A minimum of 18 channels were used for data display. Each epoch of study was reviewed manually daily and as needed using standard digital review software allowing for montage reformatting, gain and filter changes on a display system of sufficient resolution to prevent aliasing. Computerized quantitative EEG analysis (such as compressed spectral array analysis, dipole analysis, trending, automated spike & seizure detection) was used as indicated.  Description of EEG features: State of patient: Awake and sleep  Background Activity Overall Amplitude:  low amplitude, symmetric  Predominant Frequency: There is a 7-8 Hz PDR  Superimposed Frequencies: continuous medium amplitude (20-50 V) polymorphic delta>theta rhythms, bilateral and more pronounced on the left Reactivity to stimulation: unclear Asymmetry: No  Breach rhythm: No  Sleep: No sleep is recorded.  Background abnormalities: Yes 1. Continuous slow, left>right, generalized  Even during maximal alertness, continuous, waxing and waning,  diffuse polymorphic delta slowing is seen which is more prominent on the left  Periodic or rhythmic abnormalities: Yes 1. Sharp LPDs, left hemisphere This pattern is abundant and more prominent in sleep. It has a frequency of about 0.5 Hz and waveforms that are biphasic, progressively lower amplitude and more blunt in morphology.   Epileptiform discharges: Yes  1. Sharp waves, left fronto-temporal Maximum: F7-T7 Amount: as the LPDs wane, occasional isolated higher amplitude sharp waves are seen especially in sleep  Paroxysmal events or seizures: No  Push button events: No  EKG: 120 bpm and regular  Impression: This EEG is indicative of: 1. 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 There is a periodic pattern of epileptiform discharges ("PLEDs") which continues to wane in severity. 2. A mild to moderate diffuse encephalopathy.  Compared to previous epoch, there is a marked improvement both in encephalopathy and epileptogenicity.

## 2018-02-26 NOTE — Progress Notes (Signed)
CRITICAL VALUE ALERT  Critical Value:  Calcium 6.3  Date & Time Notifed: 02/26/18 0355    Provider Notified: On Call Trauma MD  Orders Received/Actions taken: Administer one gram Calcium Gluconate.

## 2018-02-26 NOTE — Progress Notes (Signed)
LTM maint completed - pt was very agitated and swatting at tech - unable to perform skin check.

## 2018-02-27 LAB — BASIC METABOLIC PANEL
Anion gap: 9 (ref 5–15)
CHLORIDE: 102 mmol/L (ref 98–111)
CO2: 26 mmol/L (ref 22–32)
Calcium: 7 mg/dL — ABNORMAL LOW (ref 8.9–10.3)
Creatinine, Ser: 0.44 mg/dL (ref 0.44–1.00)
GFR calc Af Amer: >60 mL/min (ref >60)
GFR calc non Af Amer: >60 mL/min (ref >60)
GLUCOSE: 123 mg/dL — AB (ref 70–99)
POTASSIUM: 2.6 mmol/L — AB (ref 3.5–5.1)
Sodium: 137 mmol/L (ref 135–145)

## 2018-02-27 LAB — GLUCOSE, CAPILLARY
GLUCOSE-CAPILLARY: 100 mg/dL — AB (ref 70–99)
GLUCOSE-CAPILLARY: 112 mg/dL — AB (ref 70–99)
GLUCOSE-CAPILLARY: 127 mg/dL — AB (ref 70–99)
GLUCOSE-CAPILLARY: 144 mg/dL — AB (ref 70–99)
Glucose-Capillary: 119 mg/dL — ABNORMAL HIGH (ref 70–99)
Glucose-Capillary: 121 mg/dL — ABNORMAL HIGH (ref 70–99)

## 2018-02-27 LAB — CBC
HEMATOCRIT: 36.1 % (ref 36.0–46.0)
HEMOGLOBIN: 12.2 g/dL (ref 12.0–15.0)
MCH: 32.4 pg (ref 26.0–34.0)
MCHC: 33.8 g/dL (ref 30.0–36.0)
MCV: 95.8 fL (ref 80.0–100.0)
Platelets: 132 10*3/uL — ABNORMAL LOW (ref 150–400)
RBC: 3.77 MIL/uL — ABNORMAL LOW (ref 3.87–5.11)
RDW: 21.3 % — ABNORMAL HIGH (ref 11.5–15.5)
WBC: 9.7 10*3/uL (ref 4.0–10.5)
nRBC: 0 % (ref 0.0–0.2)

## 2018-02-27 LAB — PHOSPHORUS
PHOSPHORUS: 4.3 mg/dL (ref 2.5–4.6)
Phosphorus: 1.4 mg/dL — ABNORMAL LOW (ref 2.5–4.6)

## 2018-02-27 LAB — PHENYTOIN LEVEL, TOTAL: Phenytoin Lvl: 7.2 ug/mL — ABNORMAL LOW (ref 10.0–20.0)

## 2018-02-27 LAB — MAGNESIUM
MAGNESIUM: 1.1 mg/dL — AB (ref 1.7–2.4)
MAGNESIUM: 2.2 mg/dL (ref 1.7–2.4)

## 2018-02-27 LAB — TRIGLYCERIDES: Triglycerides: 126 mg/dL (ref <150)

## 2018-02-27 MED ORDER — SODIUM CHLORIDE 0.9 % IV SOLN
6.0000 g | Freq: Once | INTRAVENOUS | Status: AC
  Administered 2018-02-27: 6 g via INTRAVENOUS
  Filled 2018-02-27: qty 12

## 2018-02-27 MED ORDER — DEXMEDETOMIDINE HCL IN NACL 200 MCG/50ML IV SOLN
0.2000 ug/kg/h | INTRAVENOUS | Status: DC
  Administered 2018-02-27: 0.5 ug/kg/h via INTRAVENOUS
  Administered 2018-02-27: 0.2 ug/kg/h via INTRAVENOUS
  Administered 2018-02-28 (×2): 0.5 ug/kg/h via INTRAVENOUS
  Administered 2018-02-28: 0.6 ug/kg/h via INTRAVENOUS
  Administered 2018-03-01: 0.7 ug/kg/h via INTRAVENOUS
  Administered 2018-03-01: 0.5 ug/kg/h via INTRAVENOUS
  Administered 2018-03-01 – 2018-03-02 (×3): 0.7 ug/kg/h via INTRAVENOUS
  Administered 2018-03-02: 0.6 ug/kg/h via INTRAVENOUS
  Administered 2018-03-03: 0.4 ug/kg/h via INTRAVENOUS
  Filled 2018-02-27 (×12): qty 50

## 2018-02-27 MED ORDER — POTASSIUM CHLORIDE 20 MEQ/15ML (10%) PO SOLN
40.0000 meq | ORAL | Status: AC
  Administered 2018-02-27 (×3): 40 meq
  Filled 2018-02-27 (×3): qty 30

## 2018-02-27 MED ORDER — SODIUM PHOSPHATES 45 MMOLE/15ML IV SOLN
30.0000 mmol | Freq: Once | INTRAVENOUS | Status: AC
  Administered 2018-02-27: 30 mmol via INTRAVENOUS
  Filled 2018-02-27: qty 10

## 2018-02-27 MED ORDER — CALCIUM GLUCONATE-NACL 1-0.675 GM/50ML-% IV SOLN
1.0000 g | Freq: Once | INTRAVENOUS | Status: AC
  Administered 2018-02-27: 1000 mg via INTRAVENOUS
  Filled 2018-02-27: qty 50

## 2018-02-27 NOTE — Progress Notes (Signed)
Reason for consult: Status epilepticus  Subjective: Patient has waxing and waning of her mental status throughout the day.  No obvious clinical seizure-like activity noted by nurses and family.  EEG did not show any electrographic seizures, although periodic epileptiform discharges were seen throughout the recording.   ROS:  Unable to obtain due to poor mental status  Examination  Vital signs in last 24 hours: Temp:  [99.5 F (37.5 C)-101.3 F (38.5 C)] 99.7 F (37.6 C) (11/14 1600) Pulse Rate:  [72-140] 91 (11/14 1600) Resp:  [21-55] 40 (11/14 1600) BP: (81-116)/(59-94) 87/61 (11/14 1600) SpO2:  [96 %-100 %] 100 % (11/14 1600) Weight:  [45.9 kg] 45.9 kg (11/14 0449)  General: lying in bed CVS: pulse-normal rate and rhythm RS: breathing comfortably Extremities: normal   Neuro: MS: somnolent, wakes on stimulation, follows simple commands intermittently, states name, month  CN: pupils equal and reactive,  EOMI, tongue midline,  Motor: Moves equally all 4 extremities with good strength Gait: not tested  Basic Metabolic Panel: Recent Labs  Lab 02/24/18 1940 02/24/18 1946 02/25/18 0420 02/26/18 0303 02/26/18 1519 02/27/18 0555  NA 137 136 134* 135  --  137  K 3.3* 3.5 4.3 2.9*  --  2.6*  CL 99 102 106 108  --  102  CO2 13*  --  16* 18*  --  26  GLUCOSE 146* 146* 103* 98  --  123*  BUN 5* 4* <5* <5*  --  <5*  CREATININE 0.66 0.40* 0.53 0.55  --  0.44  CALCIUM 8.3*  --  5.8* 6.3*  --  7.0*  MG  --   --   --   --  1.1* 1.1*  PHOS  --   --   --   --  <1.0* 1.4*    CBC: Recent Labs  Lab 02/24/18 1940 02/24/18 1946 02/25/18 0420 02/25/18 1859 02/26/18 0303 02/27/18 0555  WBC 13.7*  --  8.6 14.0* 10.0 9.7  HGB 11.6* 13.3 7.7* 13.0 12.2 12.2  HCT 37.3 39.0 24.0* 38.1 36.6 36.1  MCV 118.0*  --  114.8* 96.5 95.1 95.8  PLT 177  --  122* 107* 104* 132*     Coagulation Studies: Recent Labs    02/24/18 1940 02/25/18 0420  LABPROT 13.9 14.4  INR 1.08 1.13     Imaging Reviewed:     ASSESSMENT AND PLAN  37 year old female with traumatic subarachnoid hemorrhage and seizures.  She has been on continuous EEG which shows frequent complaints and few runs of rhythmic activity concerning for partial status.  EEG from yesterday continues to shows PLEDs, however no seizures. I suspect she will have irritable EEG given head trauma and SAH, will need to monitor clinically.   Traumatic brain injury Partial status epilepticus - resolved  Plan D/C LTM EEG Continue Keppra, Dilantin and Vimpat Will order repeat Phenytoin level in the morning Continue seizure precautions MRI brain when stable      [facility] Pager Number [phone] For questions after 7pm please refer to AMION to reach the Neurologist on call

## 2018-02-27 NOTE — Progress Notes (Signed)
EEG maint complete. No skin breakdown/ reglued leads/ continue to monitor

## 2018-02-27 NOTE — Progress Notes (Signed)
[facility]/Trauma Progress Note      Assessment/Plan Fall TBI/SAH/B F ICC/IVH- Dr. Lovell Sheehan following, TBI team therapies,  Seizure likely due to above- Keppra, Dilantin and Vimpat, EEG ends today, neurology following, MRI when stable Complex scalp lac- S/P closure 11/11 by Dr. Andrey Campanile Acute hypoxic ventilator dependent respiratory failure- extubated ABL anemia- hgb 12.2, stable CV- tachycardia, on lopressor Alcohol abuse disorder- friend reports she drinks "2 cups" brandy daily, on CIWA, precedex  Hypokalemia, hypophosphatemia, hypomag, hypocalcemia - replenish all IV, am labs  ID: febrile, Zosyn 11/13>> for possible aspiration PNA, resp culture pending FEN- Cortrak, TF VTE- PAS Dispo- ICU, TBI team therapies, speech eval pending for swallow, neurology following, will clear C spine when pt is more awake and cooperative     LOS: 3 days    Subjective: CC: TBI  Nurse states pt had 5 bowel movements overnight which the last 2 were watery diarrhea. Pt had agitation last night and was given versed. Aunt and friend at bedside. Pt will wake and follow commands. She thinks she is at home and knows her DOB. Discussed plan with family.   Objective: Vital signs in last 24 hours: Temp:  [100.2 F (37.9 C)-101.3 F (38.5 C)] 100.6 F (38.1 C) (11/14 0700) Pulse Rate:  [48-140] 120 (11/14 0700) Resp:  [19-48] 46 (11/14 0700) BP: (95-128)/(66-99) 102/76 (11/14 0700) SpO2:  [91 %-100 %] 100 % (11/14 0700) Weight:  [45.9 kg] 45.9 kg (11/14 0449) Last BM Date: 02/26/18  Intake/Output from previous day: 11/13 0701 - 11/14 0700 In: 2758.6 [I.V.:1206.3; NG/GT:201.8; IV Piggyback:1350.6] Out: 3175 [Urine:3175] Intake/Output this shift: No intake/output data recorded.  PE: Gen:  Somnolent, will awake, following commands HEENT: C collar in place, EEG leads on   Card:  Tachy, regular rhythm, no M/G/R heard Pulm:  Diminished breath sounds left base, no W/R/R, rate  increased, effort normal Abd: Soft, NT/ND, +BS Neuro: follows commands, does not appear to have a sensory deficit, moves all 4's, 4/5 grip strength Skin: no rashes noted, warm and dry   Anti-infectives: Anti-infectives (From admission, onward)   Start     Dose/Rate Route Frequency Ordered Stop   02/26/18 1200  piperacillin-tazobactam (ZOSYN) IVPB 3.375 g     3.375 g 12.5 mL/hr over 240 Minutes Intravenous Every 8 hours 02/26/18 1001        Lab Results:  Recent Labs    02/26/18 0303 02/27/18 0555  WBC 10.0 9.7  HGB 12.2 12.2  HCT 36.6 36.1  PLT 104* 132*   BMET Recent Labs    02/26/18 0303 02/27/18 0555  NA 135 137  K 2.9* 2.6*  CL 108 102  CO2 18* 26  GLUCOSE 98 123*  BUN <5* <5*  CREATININE 0.55 0.44  CALCIUM 6.3* 7.0*   PT/INR Recent Labs    02/24/18 1940 02/25/18 0420  LABPROT 13.9 14.4  INR 1.08 1.13   CMP     Component Value Date/Time   NA 137 02/27/2018 0555   K 2.6 (LL) 02/27/2018 0555   CL 102 02/27/2018 0555   CO2 26 02/27/2018 0555   GLUCOSE 123 (H) 02/27/2018 0555   BUN <5 (L) 02/27/2018 0555   CREATININE 0.44 02/27/2018 0555   CALCIUM 7.0 (L) 02/27/2018 0555   PROT 5.2 (L) 02/25/2018 0420   ALBUMIN 2.6 (L) 02/25/2018 0420   AST 182 (H) 02/25/2018 0420   ALT 58 (H) 02/25/2018 0420   ALKPHOS 92 02/25/2018 0420   BILITOT 1.5 (H) 02/25/2018 6962  GFRNONAA >60 02/27/2018 0555   GFRAA >60 02/27/2018 0555   Lipase  No results found for: LIPASE  Studies/Results: Dg Chest Port 1 View  Result Date: 02/26/2018 CLINICAL DATA:  Follow-up pneumonia. EXAM: PORTABLE CHEST 1 VIEW COMPARISON:  02/25/2018 and 02/24/2018. FINDINGS: Since prior studies, bilateral hazy perihilar opacities, left greater than right, have developed, along with interstitial prominence mild vascular congestion. No gross ORIF a pleural effusion on this supine exam. Cardiac silhouette is normal in size. No mediastinal or hilar masses. Previous day's study, the endotracheal  tube/orogastric tube have been removed IMPRESSION: 1. Interval worsening of lung aeration when compared prior exams. However, the current exam is supine and the previous day's exam with semi-erect. This position change may account for some of the apparent radiographic change. Current exam shows hazy bilateral central lung opacities, greater on the left, and vascular/interstitial prominence. The hazy opacities could reflect layering pleural effusions or could be due to atelectasis or asymmetric edema. The single day change argues against pneumonia. Electronically Signed   By: Amie Portlandavid  Ormond M.D.   On: 02/26/2018 11:11      Jerre SimonJessica L Ifeanyi Mickelson , [facility] 02/27/2018, 8:02 AM  Pager: [phone] Mon-Wed, Friday 7:00am-4:30pm Thurs 7am-11:30am  Consults: [phone]

## 2018-02-27 NOTE — Procedures (Signed)
   [facility] __________________  Video EEG Monitoring Report     Dates of recording: 02/26/2018 @10 :00 to 02/27/2018 @07 :30   Recording day: 3 (started on 11/12)   Interpreting physician: Delbert Phenixan-Andrei Clarence Dunsmore, MD       CPT: [phone]   ICD-10: G40.901          Present History: 37 year old woman with traumatic SAH and seizures. Continuous VEEG requested to evaluate for seizures.  EEG Details: Continuous video EEG was performed using standard setting per the guidelines of American Clinical Neurophysiology Society (ACNS). A minimum of 21 electrodes were placed on scalp according to the International 10-20 or 10-10 system. Supplemental electrodes were placed as needed. Single EKG electrode was also used to detect cardiac arrhythmia. Recording was performed at a sampling rate of at least 256 Hz. Patient's behavior was continuously recorded on video simultaneously with EEG. A minimum of 18 channels were used for data display. Each epoch of study was reviewed manually daily and as needed using standard digital review software allowing for montage reformatting, gain and filter changes on a display system of sufficient resolution to prevent aliasing. Computerized quantitative EEG analysis (such as compressed spectral array analysis, dipole analysis, trending, automated spike & seizure detection) was used as indicated.  Description of EEG features: State of patient: Awake and sleep  Background Activity Overall Amplitude:  low amplitude, symmetric  Predominant Frequency: There is a 7-8 Hz PDR  Superimposed Frequencies: continuous medium amplitude (20-50 V) polymorphic delta>theta rhythms, bilateral and more pronounced on the left. Low amplitude diffuse beta. Reactivity to stimulation: unclear Asymmetry: No  Breach rhythm: No  Sleep: No sleep is recorded.  Background abnormalities: Yes 1. Continuous slow, left>right, generalized  Even during maximal alertness,  continuous, waxing and waning, diffuse polymorphic delta slowing is seen which is more prominent on the left  Periodic or rhythmic abnormalities: Yes 1. Sharp LPDs, left hemisphere This pattern is abundant and more prominent in sleep. It has a frequency of about 1 / 2-4 seconds and waveforms that are biphasic, low amplitude and sharply contoured in morphology.   Epileptiform discharges: Yes  1. Sharp waves, left fronto-temporal Maximum: F7-T7 Amount: occasional isolated higher amplitude sharp waves are seen especially in sleep. They rarely form brief 2-3 second rhythmic bursts.   Paroxysmal events or seizures: No  Push button events: No  EKG: not recording reliably   Impression: This EEG is indicative of: 1. 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 There is a periodic pattern of epileptiform discharges ("PLEDs") which persists. No seizures are seen.  2. A mild to moderate diffuse encephalopathy.  Compared to previous epoch, there is no change.

## 2018-02-27 NOTE — Evaluation (Signed)
Physical Therapy Evaluation Patient Details Name: Alyssa Little MRN: [id number] DOB: 10-27-1980 Today's Date: 02/27/2018   History of Present Illness  37 yo admitted found down on break room at work.  Dx with TBI/SAH/B F ICC/IVH, seizure (likely from above) countinuous EEG finished 11/14 results pending.  MRI pending, complex scalp lac, ABL anemia, Tachycardia, alcohol abuse disorder (CIWA/precedex), hypokalemia, hypophosphatemia, hypo mag, hypocalcemia.  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  Intubated 11/11-11/12. PMhx: ETOH abuse, other not known  Clinical Impression  Co-session with OT preformed.  Pt seems to be a Rancho IV at this time.  She was sedated and responded best to painful stimuli seated EOB.  She kept eyes closed most of the session and really did not follow any commands for [name] today.  We attempted standing, but she was total support.  RN recommended coordinating with RN to bring down sedative ~ 1 hour before seeing her as she is on some slow to clear sedation.  She will likely be very appropriate for CIR level therapies at discharge.   PT to follow acutely for deficits listed below.      Follow Up Recommendations CIR    Equipment Recommendations  None recommended by PT    Recommendations for Other Services Rehab consult     Precautions / Restrictions Precautions Precautions: Fall;Cervical Precaution Booklet Issued: No      Mobility  Bed Mobility Overal bed mobility: Needs Assistance Bed Mobility: Rolling;Sidelying to Sit;Sit to Supine Rolling: +2 for physical assistance;Total assist Sidelying to sit: +2 for physical assistance;Total assist       General bed mobility comments: Total assist for bed mobility, she was pushing to get back to supine in the bed at the end of the session  Transfers Overall transfer level: Needs assistance Equipment used: None Transfers: Sit to/from Stand Sit to Stand: +2 physical  assistance;Total assist         General transfer comment: Two person total assist ot block knees and support trunk/pelvis to come to standing EOB.  Pt standing on weak legs with very little automatic initiation to take weight ot stand and extend hips and knees.   Ambulation/Gait             General Gait Details: unable at this time.          Balance Overall balance assessment: Needs assistance Sitting-balance support: Feet supported;Bilateral upper extremity supported;No upper extremity supported Sitting balance-Leahy Scale: Zero Sitting balance - Comments: Max to total assist sitting EOB to support trunk.  Pt is short in stature so feet did not touch the ground until we attempted standing.  Sat EOB for ~ 10 mins working on getting pt to respong to commands, arouse to stimuli, verbalize anything.    Standing balance support: No upper extremity supported Standing balance-Leahy Scale: Zero Standing balance comment: Total assist in standing EOB. Pt did verbalize "no" when coming to stand                             Pertinent Vitals/Pain Pain Assessment: Faces Faces Pain Scale: Hurts even more Pain Location: generalized, unable to localize Pain Descriptors / Indicators: Grimacing Pain Intervention(s): Limited activity within patient's tolerance;Monitored during session;Repositioned    Home Living Family/patient expects to be discharged to:: Private residence Living Arrangements: Alone;Spouse/significant other(2-9:30 pm Cindee Lame works) Available Help at Discharge: Family;Available PRN/intermittently Type of Home: Apartment Home Access: Stairs to enter  Entrance Stairs-Number of Steps: 1 Home Layout: One level Home Equipment: None Additional Comments: Information gathered from Brenas, significant other    Prior Function Level of Independence: Independent         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  ROM good, took some light weight on legs in standing, but did not kick in extension or WB as well as I anticiapted (she was also sedated).  LLE Deficits / Details: essentially the same as her left leg.      Cervical / Trunk Assessment Cervical / Trunk Assessment: Other exceptions Cervical / Trunk Exceptions: in c-collar, changed out for pediatric C-collar for better fit  Communication   Communication: No difficulties  Cognition Arousal/Alertness: Lethargic Behavior During Therapy: Restless;Impulsive;Anxious Overall Cognitive Status: Impaired/Different from baseline Area of Impairment: Orientation;Attention;Memory;Following commands;Safety/judgement;Awareness;Problem solving;Rancho level               Rancho Levels of Cognitive Functioning Rancho Los Amigos Scales of Cognitive Functioning: Confused/agitated Orientation Level: Disoriented to;Person;Place;Time;Situation Current Attention Level: Focused Memory: Decreased recall of precautions;Decreased short-term memory Following Commands: Follows one step commands inconsistently;Follows one step commands with increased time Safety/Judgement: Decreased awareness of safety;Decreased awareness of deficits Awareness: Intellectual Problem Solving: Slow processing;Decreased initiation;Difficulty sequencing;Requires verbal cues;Requires tactile cues General Comments: Pt on precedex and ativan to help with her restlessness and agitation.  She responded most briskly to pain stimulation, and attempt to stand      General Comments General comments (skin integrity, edema, etc.): VSS throughout,  RR actually decreased seated EOB to 12, in bed in the mid to  upper 30s.  HR 100-110 and BPs soft, but stable in the 90s/70s.  I educated Cindee Lame and later her grandparents re: stages of brain injury recovery and her current level.  BI education packet as well as appropriate interactions and activities.         Assessment/Plan    PT Assessment Patient needs continued PT services  PT Problem List Decreased mobility;Decreased strength;Decreased activity tolerance;Decreased balance;Decreased coordination;Decreased cognition;Decreased knowledge of use of DME;Decreased safety awareness;Decreased knowledge of precautions;Cardiopulmonary status limiting activity;Pain       PT Treatment Interventions DME instruction;Gait training;Stair training;Functional mobility training;Therapeutic activities;Therapeutic exercise;Balance training;Cognitive remediation;Patient/family education;Neuromuscular re-education    PT Goals (Current goals can be found in the Care Plan section)  Acute Rehab PT Goals Patient Stated Goal: unable to state PT Goal Formulation: Patient unable to participate in goal setting Time For Goal Achievement: 03/13/18 Potential to Achieve Goals: Good    Frequency Min 3X/week        Co-evaluation PT/OT/SLP Co-Evaluation/Treatment: Yes Reason for Co-Treatment: Complexity of the patient's impairments (multi-system involvement);Necessary to address cognition/behavior during functional activity;For patient/therapist safety PT goals addressed during session: Mobility/safety with mobility;Balance         AM-PAC PT "6 Clicks" Daily Activity  Outcome Measure Difficulty turning over in bed (including adjusting bedclothes, sheets and blankets)?: Unable Difficulty moving from lying on back to sitting on the side of the bed? : Unable Difficulty sitting down on and standing up from a chair with arms (e.g., wheelchair, bedside commode, etc,.)?: Unable Help needed moving to and from a bed to chair (including a  wheelchair)?: Total Help needed walking in  hospital room?: Total Help needed climbing 3-5 steps with a railing? : Total 6 Click Score: 6    End of Session   Activity Tolerance: Patient limited by lethargy Patient left: in bed;with call bell/phone within reach;with family/visitor present;with restraints reapplied Nurse Communication: Mobility status PT Visit Diagnosis: Muscle weakness (generalized) (M62.81);Difficulty in walking, not elsewhere classified (R26.2);Pain;Other symptoms and signs involving the nervous system (R29.898) Pain - Right/Left: (head) Pain - part of body: (head)    Time: 1530-1600 PT Time Calculation (min) (ACUTE ONLY): 30 min   Charges:       Lurena Joinerebecca B. Julena Barbour, PT, DPT  Acute Rehabilitation #[phone]) [phone] pager #[phone] office   PT Evaluation $PT Eval Moderate Complexity: 1 Mod         02/27/2018, 5:08 PM

## 2018-02-27 NOTE — Progress Notes (Signed)
Spoke with Building surveyorMarissa RN and discussed possible PICC d/t multiple PIV sticks. Depending on POC with electrolytes balancing out. VU. Tomasita MorrowHeather Tashe Purdon, RN VAST

## 2018-02-27 NOTE — Progress Notes (Signed)
Subjective: The patient is without change.  Her aunt and boyfriend are at the bedside.  Objective: Vital signs in last 24 hours: Temp:  [99.9 F (37.7 C)-101.3 F (38.5 C)] 99.9 F (37.7 C) (11/14 1000) Pulse Rate:  [48-140] 113 (11/14 0900) Resp:  [21-55] 55 (11/14 1000) BP: (95-128)/(66-99) 108/90 (11/14 1000) SpO2:  [91 %-100 %] 100 % (11/14 1000) Weight:  [45.9 kg] 45.9 kg (11/14 0449) Estimated body mass index is 19.12 kg/m as calculated from the following:   Height as of this encounter: 5\' 1"  (1.549 m).   Weight as of this encounter: 45.9 kg.   Intake/Output from previous day: 11/13 0701 - 11/14 0700 In: 2758.6 [I.V.:1206.3; NG/GT:201.8; IV Piggyback:1350.6] Out: 3175 [Urine:3175] Intake/Output this shift: Total I/O In: 879 [I.V.:1.7; NG/GT:675; IV Piggyback:202.3] Out: -   Physical exam the patient is somnolent but arousable.  She mumbles.  Glasgow Coma Scale 12.  She moves all 4 extremities well.  Her pupils are equal.  Lab Results: Recent Labs    02/26/18 0303 02/27/18 0555  WBC 10.0 9.7  HGB 12.2 12.2  HCT 36.6 36.1  PLT 104* 132*   BMET Recent Labs    02/26/18 0303 02/27/18 0555  NA 135 137  K 2.9* 2.6*  CL 108 102  CO2 18* 26  GLUCOSE 98 123*  BUN <5* <5*  CREATININE 0.55 0.44  CALCIUM 6.3* 7.0*    Studies/Results: Dg Chest Port 1 View  Result Date: 02/26/2018 CLINICAL DATA:  Follow-up pneumonia. EXAM: PORTABLE CHEST 1 VIEW COMPARISON:  02/25/2018 and 02/24/2018. FINDINGS: Since prior studies, bilateral hazy perihilar opacities, left greater than right, have developed, along with interstitial prominence mild vascular congestion. No gross ORIF a pleural effusion on this supine exam. Cardiac silhouette is normal in size. No mediastinal or hilar masses. Previous day's study, the endotracheal tube/orogastric tube have been removed IMPRESSION: 1. Interval worsening of lung aeration when compared prior exams. However, the current exam is supine and  the previous day's exam with semi-erect. This position change may account for some of the apparent radiographic change. Current exam shows hazy bilateral central lung opacities, greater on the left, and vascular/interstitial prominence. The hazy opacities could reflect layering pleural effusions or could be due to atelectasis or asymmetric edema. The single day change argues against pneumonia. Electronically Signed   By: Amie Portlandavid  Ormond M.D.   On: 02/26/2018 11:11    Assessment/Plan: Traumatic brain injury, subarachnoid hemorrhage: The patient is clinically stable.  I will plan to repeat her CAT scan in a few days to monitor for hydrocephalus.  LOS: 3 days     Cristi LoronJeffrey D Dene Landsberg 02/27/2018, 11:11 AM

## 2018-02-27 NOTE — Progress Notes (Signed)
vLTM EEG complete. No skin breakdown 

## 2018-02-27 NOTE — Progress Notes (Signed)
SLP Cancellation Note  Patient Details Name: Alyssa LacyChristina Danielle Little MRN: [id number] DOB: 04/13/1981   Cancelled treatment:       Reason Eval/Treat Not Completed: Patient's level of consciousness. Will follow for readiness.    Remington Skalsky, Riley NearingBonnie Caroline 02/27/2018, 1:34 PM

## 2018-02-28 ENCOUNTER — Inpatient Hospital Stay (HOSPITAL_COMMUNITY): Payer: Managed Care, Other (non HMO)

## 2018-02-28 DIAGNOSIS — G40901 Epilepsy, unspecified, not intractable, with status epilepticus: Secondary | ICD-10-CM

## 2018-02-28 LAB — CBC
HEMATOCRIT: 32.6 % — AB (ref 36.0–46.0)
Hemoglobin: 10.6 g/dL — ABNORMAL LOW (ref 12.0–15.0)
MCH: 32.1 pg (ref 26.0–34.0)
MCHC: 32.5 g/dL (ref 30.0–36.0)
MCV: 98.8 fL (ref 80.0–100.0)
NRBC: 0 % (ref 0.0–0.2)
Platelets: 143 10*3/uL — ABNORMAL LOW (ref 150–400)
RBC: 3.3 MIL/uL — ABNORMAL LOW (ref 3.87–5.11)
RDW: 21.2 % — AB (ref 11.5–15.5)
WBC: 6.1 10*3/uL (ref 4.0–10.5)

## 2018-02-28 LAB — COMPREHENSIVE METABOLIC PANEL
ALT: 26 U/L (ref 0–44)
AST: 54 U/L — AB (ref 15–41)
Albumin: 2.2 g/dL — ABNORMAL LOW (ref 3.5–5.0)
Alkaline Phosphatase: 76 U/L (ref 38–126)
Anion gap: 5 (ref 5–15)
BILIRUBIN TOTAL: 0.7 mg/dL (ref 0.3–1.2)
BUN: <5 mg/dL — ABNORMAL LOW (ref 6–20)
CO2: 21 mmol/L — ABNORMAL LOW (ref 22–32)
Calcium: 6.8 mg/dL — ABNORMAL LOW (ref 8.9–10.3)
Chloride: 113 mmol/L — ABNORMAL HIGH (ref 98–111)
Creatinine, Ser: 0.46 mg/dL (ref 0.44–1.00)
GFR calc Af Amer: >60 mL/min (ref >60)
Glucose, Bld: 131 mg/dL — ABNORMAL HIGH (ref 70–99)
POTASSIUM: 4.2 mmol/L (ref 3.5–5.1)
Sodium: 139 mmol/L (ref 135–145)
TOTAL PROTEIN: 5 g/dL — AB (ref 6.5–8.1)

## 2018-02-28 LAB — GLUCOSE, CAPILLARY
GLUCOSE-CAPILLARY: 100 mg/dL — AB (ref 70–99)
GLUCOSE-CAPILLARY: 120 mg/dL — AB (ref 70–99)
GLUCOSE-CAPILLARY: 134 mg/dL — AB (ref 70–99)
GLUCOSE-CAPILLARY: 142 mg/dL — AB (ref 70–99)
Glucose-Capillary: 109 mg/dL — ABNORMAL HIGH (ref 70–99)
Glucose-Capillary: 117 mg/dL — ABNORMAL HIGH (ref 70–99)

## 2018-02-28 MED ORDER — FOLIC ACID 1 MG PO TABS
1.0000 mg | ORAL_TABLET | Freq: Every day | ORAL | Status: DC
  Administered 2018-03-01 – 2018-03-05 (×5): 1 mg
  Filled 2018-02-28 (×5): qty 1

## 2018-02-28 MED ORDER — VITAMIN B-1 100 MG PO TABS
100.0000 mg | ORAL_TABLET | Freq: Every day | ORAL | Status: DC
  Administered 2018-03-01 – 2018-03-05 (×5): 100 mg
  Filled 2018-02-28 (×5): qty 1

## 2018-02-28 MED ORDER — ADULT MULTIVITAMIN LIQUID CH
15.0000 mL | Freq: Every day | ORAL | Status: DC
  Administered 2018-03-01 – 2018-03-03 (×3): 15 mL
  Filled 2018-02-28 (×3): qty 15

## 2018-02-28 MED ORDER — FUROSEMIDE 10 MG/ML IJ SOLN
40.0000 mg | Freq: Once | INTRAMUSCULAR | Status: AC
  Administered 2018-02-28: 40 mg via INTRAVENOUS
  Filled 2018-02-28: qty 4

## 2018-02-28 NOTE — Progress Notes (Signed)
Occupational Therapy Treatment Patient Details Name: Alyssa LacyChristina Danielle Little MRN: [id number] DOB: 04/09/1981 Today's Date: 02/28/2018    History of present illness 37 yo admitted found down on break room at work.  Dx with TBI/SAH/B F ICC/IVH, seizure (likely from above) countinuous EEG finished 11/14 results pending.  MRI pending, complex scalp lac, ABL anemia, Tachycardia, alcohol abuse disorder (CIWA/precedex), hypokalemia, hypophosphatemia, hypo mag, hypocalcemia.  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  Intubated 11/11-11/12. PMhx: ETOH abuse, other not known   OT comments  Nsg turned down Precedex prior to session. Pt lethargic in bed on arrival. Once EOB, pt with increased level of arousal and following 1 step commands with increased time. Pt recognized grandparents and more verbal. Able to stand step to chair with +2 physical A for stability. Able to participate in ADL task in seated position. Pt more consistent with Rancho level V today. Continue to recommend CIR for rehab. Will continue to follow acutely.   Follow Up Recommendations  CIR;Supervision/Assistance - 24 hour    Equipment Recommendations  3 in 1 bedside commode    Recommendations for Other Services Rehab consult    Precautions / Restrictions Precautions Precautions: Fall Precaution Comments: NG/bridal Required Braces or Orthoses: Cervical Brace Cervical Brace: Other (comment);Hard collar(C-spine not cleared)       Mobility Bed Mobility Overal bed mobility: Needs Assistance Bed Mobility: Sidelying to Sit   Sidelying to sit: Mod assist       General bed mobility comments: tactile cues and facilitation required to initiate activity; Pt initiating transition to sit  Transfers Overall transfer level: Needs assistance   Transfers: Sit to/from Stand;Stand Pivot Transfers Sit to Stand: Mod assist;+2 physical assistance Stand pivot transfers: Mod assist;+2 physical assistance       General transfer comment: Able to stand step to chair; following command to trnasfer    Balance     Sitting balance-Leahy Scale: Fair       Standing balance-Leahy Scale: Poor                             ADL either performed or assessed with clinical judgement   ADL Overall ADL's : Needs assistance/impaired Eating/Feeding: NPO   Grooming: Moderate assistance;Oral care;Wash/dry face Grooming Details (indicate cue type and reason): cues to sustain attention to task due to lethargy                             Functional mobility during ADLs: +2 for physical assistance;Moderate assistance General ADL Comments: Pt ableto intiate stand and step to chair. Increased attention to ADL tasks     Vision   Vision Assessment?: Vision impaired- to be further tested in functional context Additional Comments: wears glasses at all times; decreased visual attention; light sensitive   Perception     Praxis      Cognition Arousal/Alertness: Lethargic Behavior During Therapy: Restless;Flat affect Overall Cognitive Status: Impaired/Different from baseline Area of Impairment: Orientation;Attention;Memory;Following commands;Safety/judgement;Awareness;Problem solving;Rancho level                 Orientation Level: Disoriented to;Place;Time;Situation Current Attention Level: Sustained Memory: Decreased recall of precautions;Decreased short-term memory Following Commands: Follows one step commands with increased time Safety/Judgement: Decreased awareness of safety;Decreased awareness of deficits Awareness: Intellectual Problem Solving: Slow processing;Decreased initiation;Difficulty sequencing;Requires verbal cues;Requires tactile cues General Comments: Precedex turned down but not off; ablet o  sutain attention to task for short periods of time; Able to sustain attneiton to brush teeth; pt appeasr to pereverate at times; Difficulty maintaining arousal throughout  session which is most likely affected by medication in addition to TBI        Exercises     Shoulder Instructions       General Comments      Pertinent Vitals/ Pain       Pain Assessment: Faces Faces Pain Scale: Hurts little more Pain Location: head/face Pain Descriptors / Indicators: Grimacing;Discomfort Pain Intervention(s): Limited activity within patient's tolerance  Home Living                                          Prior Functioning/Environment              Frequency  Min 3X/week        Progress Toward Goals  OT Goals(current goals can now be found in the care plan section)  Progress towards OT goals: Progressing toward goals  Acute Rehab OT Goals Patient Stated Goal: none stated OT Goal Formulation: With family Time For Goal Achievement: 03/14/18 Potential to Achieve Goals: Good ADL Goals Pt Will Perform Grooming: with mod assist;sitting Pt Will Perform Upper Body Bathing: with mod assist;sitting Additional ADL Goal #1: Pt will follow 1 step commands 3/4 trials in non-distracting environament  Plan Discharge plan remains appropriate    Co-evaluation    PT/OT/SLP Co-Evaluation/Treatment: Yes Reason for Co-Treatment: Complexity of the patient's impairments (multi-system involvement);Necessary to address cognition/behavior during functional activity;To address functional/ADL transfers   OT goals addressed during session: ADL's and self-care      AM-PAC PT "6 Clicks" Daily Activity     Outcome Measure   Help from another person eating meals?: Total Help from another person taking care of personal grooming?: A Lot Help from another person toileting, which includes using toliet, bedpan, or urinal?: Total Help from another person bathing (including washing, rinsing, drying)?: Total Help from another person to put on and taking off regular upper body clothing?: Total Help from another person to put on and taking off regular lower  body clothing?: Total 6 Click Score: 7    End of Session Equipment Utilized During Treatment: Gait belt  OT Visit Diagnosis: Other abnormalities of gait and mobility (R26.89);Muscle weakness (generalized) (M62.81);Other symptoms and signs involving cognitive function   Activity Tolerance Patient tolerated treatment well   Patient Left in chair;with call bell/phone within reach;Other (comment)(seat belt alarm; working with ST)   Nurse Communication Mobility status        Time: 1610-9604 OT Time Calculation (min): 36 min  Charges: OT General Charges $OT Visit: 1 Visit OT Treatments $Self Care/Home Management : 8-22 mins  Luisa Dago, OT/L   Acute OT Clinical Specialist Acute Rehabilitation Services Pager [phone] Office [phone]    [facility] 02/28/2018, 3:28 PM

## 2018-02-28 NOTE — Progress Notes (Signed)
Inpatient Rehabilitation-Admissions Coordinator   [facility] request IP Rehab Consult Order from MD at this time.   Please call if questions.   Nanine MeansKelly Marla Pouliot, OTR/L  Rehab Admissions Coordinator  [phone] 02/28/2018 5:18 PM

## 2018-02-28 NOTE — Progress Notes (Signed)
Patient ID: Alyssa Little, female   DOB: July 04, 1980, 37 y.o.   MRN: [id number]       Subjective: Patient is much more alert and logical today.  She answers questions appropriately.  She was down to 0.4 on precedex, but got more fidgety and was increase to 0.5 and is now doing well.  She states her head hurts some, but otherwise no pain.    Objective: Vital signs in last 24 hours: Temp:  [97.5 F (36.4 C)-101.7 F (38.7 C)] 97.5 F (36.4 C) (11/15 0843) Pulse Rate:  [91-127] 123 (11/15 0900) Resp:  [23-59] 35 (11/15 0900) BP: (81-108)/(59-90) 94/75 (11/15 0900) SpO2:  [96 %-100 %] 98 % (11/15 0900) Weight:  [46.6 kg] 46.6 kg (11/15 0500) Last BM Date: 02/27/18  Intake/Output from previous day: 11/14 0701 - 11/15 0700 In: 2109.3 [I.V.:462.1; NG/GT:1035; IV Piggyback:612.2] Out: 1450 [Urine:1150; Stool:300] Intake/Output this shift: No intake/output data recorded.  PE: Gen: more alert and cooperative today HEENT: some tenderness to bony prominences of cervical spine.  Scalp lacerations are sutured and are c/d/i Heart: tachy 120-130s Lungs: tiny breaths.  RR 40-50s.  Decrease breath sounds at bases.  Pulls around [phone] on IS. Abd: soft, NT, ND, +BS.  Rectal pouch in place with liquid stool present.  purewick in place with clear yellow urine.  Lab Results:  Recent Labs    02/27/18 0555 02/28/18 0537  WBC 9.7 6.1  HGB 12.2 10.6*  HCT 36.1 32.6*  PLT 132* 143*   BMET Recent Labs    02/27/18 0555 02/28/18 0537  NA 137 139  K 2.6* 4.2  CL 102 113*  CO2 26 21*  GLUCOSE 123* 131*  BUN <5* <5*  CREATININE 0.44 0.46  CALCIUM 7.0* 6.8*   PT/INR No results for input(s): LABPROT, INR in the last 72 hours. CMP     Component Value Date/Time   NA 139 02/28/2018 0537   K 4.2 02/28/2018 0537   CL 113 (H) 02/28/2018 0537   CO2 21 (L) 02/28/2018 0537   GLUCOSE 131 (H) 02/28/2018 0537   BUN <5 (L) 02/28/2018 0537   CREATININE 0.46 02/28/2018 0537   CALCIUM  6.8 (L) 02/28/2018 0537   PROT 5.0 (L) 02/28/2018 0537   ALBUMIN 2.2 (L) 02/28/2018 0537   AST 54 (H) 02/28/2018 0537   ALT 26 02/28/2018 0537   ALKPHOS 76 02/28/2018 0537   BILITOT 0.7 02/28/2018 0537   GFRNONAA >60 02/28/2018 0537   GFRAA >60 02/28/2018 0537   Lipase  No results found for: LIPASE     Studies/Results: Dg Chest Port 1 View  Result Date: 02/26/2018 CLINICAL DATA:  Follow-up pneumonia. EXAM: PORTABLE CHEST 1 VIEW COMPARISON:  02/25/2018 and 02/24/2018. FINDINGS: Since prior studies, bilateral hazy perihilar opacities, left greater than right, have developed, along with interstitial prominence mild vascular congestion. No gross ORIF a pleural effusion on this supine exam. Cardiac silhouette is normal in size. No mediastinal or hilar masses. Previous day's study, the endotracheal tube/orogastric tube have been removed IMPRESSION: 1. Interval worsening of lung aeration when compared prior exams. However, the current exam is supine and the previous day's exam with semi-erect. This position change may account for some of the apparent radiographic change. Current exam shows hazy bilateral central lung opacities, greater on the left, and vascular/interstitial prominence. The hazy opacities could reflect layering pleural effusions or could be due to atelectasis or asymmetric edema. The single day change argues against pneumonia. Electronically Signed   By: Onalee Hua  Ormond M.D.   On: 02/26/2018 11:11    Anti-infectives: Anti-infectives (From admission, onward)   Start     Dose/Rate Route Frequency Ordered Stop   02/26/18 1200  piperacillin-tazobactam (ZOSYN) IVPB 3.375 g     3.375 g 12.5 mL/hr over 240 Minutes Intravenous Every 8 hours 02/26/18 1001         Assessment/Plan Fall TBI/SAH/B F ICC/IVH- Dr. Lovell SheehanJenkins following, TBI team therapies, plans for repeat CT of head next week Seizure likely due to above- Keppra, Dilantin and Vimpat, EEG completed, neurology following, MRI  when stable Complex scalp lac- S/P closure 11/11 by Dr. Andrey CampanileWIlson Acute hypoxic ventilator dependent respiratory failure-extubated ABL anemia-hgb 10.6 CV- tachycardia,on lopressor Alcohol abuse disorder- friend reports she drinks "2 cups" brandy daily,onCIWA, precedex  Hypokalemia, hypophosphatemia, hypomag, hypocalcemia - stable, recheck in am  ID: febrile, Zosyn 11/13>> for possible PNA vs fluid overload, resp culture pending, repeat CXR today due to persistent fevers despite being on zosyn.  Check UA as well FEN-Cortrak, TF, will have speech resee patient today to determine if Cortrak can be removed and she can have a diet VTE- PAS Dispo- ICU, TBI team therapies, speech eval pending for swallow, neurology and NS following.  NS felt like she needs flex-ex of her neck as do I as she has bony tenderness.  Will try tomorrow if she continues to improve and can follow directions.  Grandparents arrived today as well and add to her history that she apparently had issues with unintentional weight loss this summer of between 15-20#s.  Boyfriend apparently told them she was having nausea and vomiting, however, she currently denies that.  She denies abdominal pain this summer.  Grandmother states that her paternal grandmother had "biliary cholangitis" and passed away from this around age 38.  LFTs here are normal, but she does not have any abdominal imaging.  Will likely need outpatient follow up for further work up.   LOS: 4 days    Letha CapeKelly E Shan Valdes , [facility] 02/28/2018, 9:46 AM Pager: [phone]

## 2018-02-28 NOTE — Progress Notes (Signed)
SLP Cancellation Note  Patient Details Name: Alyssa LacyChristina Danielle Dildine MRN: [id number] DOB: 11/19/1980   Cancelled treatment:       Reason Eval/Treat Not Completed: Patient not sufficiently arousable for swallow assessment; RR high 40s/low 50s.  Will continue efforts this afternoon. D/W RN.   Blenda MountsCouture, Pleasant Britz Laurice 02/28/2018, 12:20 PM  Marchelle FolksAmanda L. Samson Fredericouture, MA CCC/SLP Acute Rehabilitation Services Office number [phone] Pager [phone]

## 2018-02-28 NOTE — Progress Notes (Signed)
Physical Therapy Treatment Patient Details Name: Alyssa LacyChristina Danielle Little MRN: [id number] DOB: 05/13/1980 Today's Date: 02/28/2018    History of Present Illness 37 yo admitted found down on break room at work.  Dx with TBI/SAH/B F ICC/IVH, seizure (likely from above) countinuous EEG finished 11/14 results pending.  MRI pending, complex scalp lac, ABL anemia, Tachycardia, alcohol abuse disorder (CIWA/precedex), hypokalemia, hypophosphatemia, hypo mag, hypocalcemia.  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  Intubated 11/11-11/12. PMhx: ETOH abuse, other not known    PT Comments    Pt presents more like a Rancho V today, following basic commands, recognizing some family, identifying her needs (dry mouth).  She was positioned in the recliner chair working with SLP with posey alarm belt engaged when we left.  PT and OT co-treated again today.    Follow Up Recommendations  CIR     Equipment Recommendations  None recommended by PT    Recommendations for Other Services Rehab consult     Precautions / Restrictions Precautions Precautions: Fall;Cervical Precaution Comments: cortrack Required Braces or Orthoses: Cervical Brace Cervical Brace: Other (comment);Hard collar(c-spine not cleared yet)    Mobility  Bed Mobility Overal bed mobility: Needs Assistance Bed Mobility: Rolling;Sidelying to Sit Rolling: Mod assist Sidelying to sit: Mod assist;HOB elevated       General bed mobility comments: tactile cues, verbal cues and manual facilitation to initiate movement, but pt followed through with the cues with movement.   Transfers Overall transfer level: Needs assistance Equipment used: None Transfers: Sit to/from UGI CorporationStand;Stand Pivot Transfers Sit to Stand: Mod assist;+2 physical assistance Stand pivot transfers: Mod assist;+2 physical assistance       General transfer comment: Two person light mod assist to stand and take a few pivotal steps around to  the recliner chair with therapists supporting trunk over weak legs.  Pt did not really hold onto us with her arms, took most of her weight through her feet.   Ambulation/Gait             General Gait Details: unable at this time.        Balance Overall balance assessment: Needs assistance Sitting-balance support: Feet supported;Bilateral upper extremity supported Sitting balance-Leahy Scale: Fair Sitting balance - Comments: started as mod assist in sitting EOB, but progressed to min guard and close supervision.    Standing balance support: No upper extremity supported Standing balance-Leahy Scale: Poor Standing balance comment: needed external assist to stand.                             Cognition Arousal/Alertness: Lethargic Behavior During Therapy: Restless;Flat affect Overall Cognitive Status: Impaired/Different from baseline Area of Impairment: Orientation;Attention;Memory;Following commands;Awareness;Safety/judgement;Problem solving;Rancho level               Rancho Levels of Cognitive Functioning Rancho MirantLos Amigos Scales of Cognitive Functioning: Confused/inappropriate/non-agitated(more V than IV, but still seems to have some IV moments) Orientation Level: Disoriented to;Place;Time;Situation("hospital") Current Attention Level: Sustained Memory: Decreased recall of precautions;Decreased short-term memory Following Commands: Follows one step commands consistently;Follows one step commands with increased time Safety/Judgement: Decreased awareness of safety;Decreased awareness of deficits Awareness: Intellectual Problem Solving: Slow processing;Decreased initiation;Difficulty sequencing;Requires verbal cues;Requires tactile cues General Comments: Precedex turned down but not off; ablet o sutain attention to task for short periods of time; Able to sustain attneiton to brush teeth; pt appeasr to perseverate at times; Difficulty maintaining arousal throughout  session which is  most likely affected by medication in addition to TBI.  Eyes open ~25% of time, seems to be light sensative, expressing some basic needs, recognized her grandmother         General Comments General comments (skin integrity, edema, etc.): RR up to 52 max observed, but could be brought down when practivcing incentive spirometer, HR 110s, BPs soft, but stable.  O2 sats >94% on RA during mobility.       Pertinent Vitals/Pain Pain Assessment: Faces Faces Pain Scale: Hurts little more Pain Location: head/face Pain Descriptors / Indicators: Grimacing;Discomfort Pain Intervention(s): Limited activity within patient's tolerance;Monitored during session;Repositioned           PT Goals (current goals can now be found in the care plan section) Acute Rehab PT Goals Patient Stated Goal: none stated Progress towards PT goals: Progressing toward goals    Frequency    Min 3X/week      PT Plan Current plan remains appropriate    Co-evaluation PT/OT/SLP Co-Evaluation/Treatment: Yes Reason for Co-Treatment: Complexity of the patient's impairments (multi-system involvement);Necessary to address cognition/behavior during functional activity;For patient/therapist safety;To address functional/ADL transfers PT goals addressed during session: Mobility/safety with mobility;Balance OT goals addressed during session: ADL's and self-care      AM-PAC PT "6 Clicks" Daily Activity  Outcome Measure  Difficulty turning over in bed (including adjusting bedclothes, sheets and blankets)?: Unable Difficulty moving from lying on back to sitting on the side of the bed? : Unable Difficulty sitting down on and standing up from a chair with arms (e.g., wheelchair, bedside commode, etc,.)?: Unable Help needed moving to and from a bed to chair (including a wheelchair)?: A Lot Help needed walking in hospital room?: A Lot Help needed climbing 3-5 steps with a railing? : Total 6 Click Score: 8     End of Session Equipment Utilized During Treatment: Gait belt Activity Tolerance: Patient limited by lethargy;Patient limited by pain Patient left: in chair;with call bell/phone within reach;with chair alarm set;Other (comment)(posey belt alarm) Nurse Communication: Mobility status PT Visit Diagnosis: Muscle weakness (generalized) (M62.81);Difficulty in walking, not elsewhere classified (R26.2);Pain;Other symptoms and signs involving the nervous system (R29.898) Pain - Right/Left: (head) Pain - part of body: (head)     Time: 1610-9604 PT Time Calculation (min) (ACUTE ONLY): 32 min  Charges:  $Therapeutic Activity: 8-22 mins                    Palmer Fahrner B. Alyssa Little, PT, DPT  Acute Rehabilitation [phone] pager #[phone] office   02/28/2018, 3:41 PM

## 2018-02-28 NOTE — Progress Notes (Signed)
Rehab Admissions Coordinator Note:  Per PT and OT recommendation, Patient was screened by Nanine MeansKelly Kamar Callender for appropriateness for an Inpatient Acute Rehab Consult.  [facility] time, [facility] progress with therapies prior to placing IP Rehab Consult Order request.   Will continue to follow.   Nanine MeansKelly Dana Debo 02/28/2018, 10:28 AM  I can be reached at [phone].

## 2018-02-28 NOTE — Progress Notes (Signed)
OT Evaluation  PTA, pt lived alone (sometimes with significant other) and worked full-time with [employer]. Pt lethargic during assessment (on Precedex and Ativan). Pt inconsistently following commands and per nsg trying to crawl out of bed and pull at tubes when not sedated. Pt presents as Rancho level IV at this time. Once C-spine cleared and NG tube DC, pt would most likely be appropriate for use of [place] enclosure bed to reduce use of restraints and encourage progression with rehab. Pt will benefit from rehab at CIR. Will follow acutely. Significant other provided with TBI handbook and began education.    02/28/18 0900  OT Visit Information  Last OT Received On 02/28/18  Assistance Needed +2  PT/OT/SLP Co-Evaluation/Treatment Yes  Reason for Co-Treatment Complexity of the patient's impairments (multi-system involvement);Necessary to address cognition/behavior during functional activity;For patient/therapist safety  OT goals addressed during session ADL's and self-care  History of Present Illness 37 yo admitted found down on break room at work.  Dx with TBI/SAH/B F ICC/IVH, seizure (likely from above) countinuous EEG finished 11/14 results pending.  MRI pending, complex scalp lac, ABL anemia, Tachycardia, alcohol abuse disorder (CIWA/precedex), hypokalemia, hypophosphatemia, hypo mag, hypocalcemia.  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  Intubated 11/11-11/12. PMhx: ETOH abuse, other not known  Precautions  Precautions Fall  Precaution Booklet Issued No  Required Braces or Orthoses Cervical Brace (C-spine not cleared at time of eval)  Home Living  Family/patient expects to be discharged to: Private residence  Living Arrangements Alone;Spouse/significant other (2-9:30 pm Cindee Lame works)  Available Help at Discharge Family;Available PRN/intermittently  Type of Home Apartment  Home Access Stairs to enter  Entrance Stairs-Number of Steps 1  Home Layout One level   Bathroom Shower/Tub Tub/shower unit  Tour manager None  Additional Comments Information gathered from [place], significant other  Prior Function  Level of Independence Independent  Comments 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, 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  Communication  Communication Other (comment) (poor voice quality; uttering "yeah/no" at times)  Pain Assessment  Pain Assessment Faces  Faces Pain Scale 6  Pain Location generalized, unable to localize  Pain Descriptors / Indicators Grimacing  Pain Intervention(s) Limited activity within patient's tolerance  Cognition  Arousal/Alertness Lethargic  Behavior During Therapy Restless;Impulsive;Anxious;Flat affect  Overall Cognitive Status Impaired/Different from baseline  Area of Impairment Orientation;Attention;Memory;Following commands;Safety/judgement;Awareness;Problem solving;Rancho level  Orientation Level Disoriented to;Person;Place;Time;Situation  Current Attention Level Focused  Memory Decreased recall of precautions;Decreased short-term memory  Following Commands Follows one step commands inconsistently;Follows one step commands with increased time  Safety/Judgement Decreased awareness of safety;Decreased awareness of deficits  Awareness Intellectual  Problem Solving Slow processing;Decreased initiation;Difficulty sequencing;Requires verbal cues;Requires tactile cues  General Comments Pt on precedex and ativan to help with her restlessness and agitation. Precedez turned down 30 min prior to session She responded most briskly to pain stimulation, and attempt to stand. Pt initiating attempting to lie back down. Per nsg, pt attempting to crawl out of bed and verbal when not sedated.  Rancho Levels of Cognitive Functioning  Rancho Los Amigos Scales of Cognitive Functioning IV  Upper Extremity Assessment  Upper Extremity Assessment Generalized weakness (Moving BUE spontaneously. will further assess)  Lower Extremity Assessment   Lower Extremity Assessment Defer to PT evaluation  RLE Deficits / Details Pt actively moves both LEs, just not to command right now.  ROM good, took some light weight on legs in standing, but did not kick in extension or WB as  well as I anticiapted (she was also sedated).   Cervical / Trunk Assessment  Cervical / Trunk Assessment Other exceptions  Cervical / Trunk Exceptions in c-collar, changed out for pediatric C-collar for better fit  ADL  Overall ADL's  Needs assistance/impaired  General ADL Comments total A at this time  Vision- Assessment  Additional Comments eyes closed majority of eval. will further assess  Perception  Comments will assess  Praxis  Praxis tested? Deficits  Deficits Initiation  Bed Mobility  Overal bed mobility Needs Assistance  Bed Mobility Rolling;Sidelying to Sit;Sit to Supine  Rolling +2 for physical assistance;Total assist  Sidelying to sit +2 for physical assistance;Total assist  General bed mobility comments Total assist for bed mobility, she was pushing to get back to supine in the bed at the end of the session  Transfers  Overall transfer level Needs assistance  Equipment used None  Transfers Sit to/from Stand  Sit to Stand +2 physical assistance;Total assist  General transfer comment Two person total assist ot block knees and support trunk/pelvis to come to standing EOB.  Pt standing on weak legs with very little automatic initiation to take weight ot stand and extend hips and knees.   Balance  Overall balance assessment Needs assistance  Sitting-balance support Feet supported;Bilateral upper extremity supported;No upper extremity supported  Sitting balance-Leahy Scale Zero  Sitting balance - Comments Max to total assist sitting EOB to support trunk.  Pt is short in stature so feet did not touch the ground until we attempted standing.  Sat EOB for ~ 10 mins working on getting pt to respong to commands, arouse to stimuli, verbalize anything.    Standing balance support No upper extremity supported  Standing balance-Leahy Scale Zero  Standing balance comment Total assist in standing EOB. Pt did verbalize "no" when coming to stand  OT - End of Session  Activity Tolerance Patient limited by lethargy  Patient left in bed;with call bell/phone within reach;with bed alarm set;with family/visitor present;with restraints reapplied  Nurse Communication Mobility status  OT Assessment  OT Recommendation/Assessment Patient needs continued OT Services  OT Visit Diagnosis Other abnormalities of gait and mobility (R26.89);Muscle weakness (generalized) (M62.81);Other symptoms and signs involving cognitive function  OT Problem List Decreased strength;Decreased activity tolerance;Impaired balance (sitting and/or standing);Decreased coordination;Decreased cognition;Decreased safety awareness;Decreased knowledge of use of DME or AE  OT Plan  OT Frequency (ACUTE ONLY) Min 3X/week  OT Treatment/Interventions (ACUTE ONLY) Self-care/ADL training;Therapeutic exercise;Neuromuscular education;DME and/or AE instruction;Therapeutic activities;Cognitive remediation/compensation;Visual/perceptual remediation/compensation;Patient/family education;Balance training  AM-PAC OT "6 Clicks" Daily Activity Outcome Measure  Help from another person eating meals? 1  Help from another person taking care of personal grooming? 1  Help from another person toileting, which includes using toliet, bedpan, or urinal? 1  Help from another person bathing (including washing, rinsing, drying)? 1  Help from another person to put on and taking off regular upper body clothing? 1  Help from another person to put on and taking off regular lower body clothing? 1  6 Click Score 6  ADL G Code Conversion CN  OT Recommendation  Recommendations for Other Services Rehab consult  Follow Up Recommendations CIR;Supervision/Assistance - 24 hour  OT Equipment 3 in 1 bedside commode  Individuals  Consulted  Consulted and Agree with Results and Recommendations Family member/caregiver  Family Member Consulted [name] - significant other   Acute Rehab OT Goals  Patient Stated Goal unable to state  OT Goal Formulation With family  Time For Goal Achievement 03/14/18  Potential to Achieve Goals Good  OT Time Calculation  OT Start Time (ACUTE ONLY) 1539  OT Stop Time (ACUTE ONLY) 1605  OT Time Calculation (min) 26 min  Written Expression  Dominant Hand Right  Luisa DagoHilary Johnny Gorter, OT/L   Acute OT Clinical Specialist Acute Rehabilitation Services Pager [phone] Office [phone]

## 2018-02-28 NOTE — Progress Notes (Signed)
Reason for consult:   Subjective: Patient more alert today, following commands and answering questions appropriately, still sliughtly confused.    ROS: negative except above Examination  Vital signs in last 24 hours: Temp:  [99.5 F (37.5 C)-101.7 F (38.7 C)] 100.8 F (38.2 C) (11/15 0600) Pulse Rate:  [91-127] 122 (11/15 0700) Resp:  [23-59] 52 (11/15 0700) BP: (81-108)/(59-90) 103/81 (11/15 0700) SpO2:  [96 %-100 %] 99 % (11/15 0700) Weight:  [46.6 kg] 46.6 kg (11/15 0500)  General: lying in bed CVS: pulse-normal rate and rhythm RS: breathing comfortably Extremities: normal   Neuro: MS: Alert, oriented to name, confused to location, follows commands CN: pupils equal and reactive,  EOMI, face symmetric, tongue midline, normal sensation over face, Motor: 5/5 strength in all 4 extremities Plantars: flexor Coordination: normal Gait: not tested  Basic Metabolic Panel: Recent Labs  Lab 02/24/18 1940 02/24/18 1946 02/25/18 0420 02/26/18 0303 02/26/18 1519 02/27/18 0555 02/27/18 1703 02/28/18 0537  NA 137 136 134* 135  --  137  --  139  K 3.3* 3.5 4.3 2.9*  --  2.6*  --  4.2  CL 99 102 106 108  --  102  --  113*  CO2 13*  --  16* 18*  --  26  --  21*  GLUCOSE 146* 146* 103* 98  --  123*  --  131*  BUN 5* 4* <5* <5*  --  <5*  --  <5*  CREATININE 0.66 0.40* 0.53 0.55  --  0.44  --  0.46  CALCIUM 8.3*  --  5.8* 6.3*  --  7.0*  --  6.8*  MG  --   --   --   --  1.1* 1.1* 2.2  --   PHOS  --   --   --   --  <1.0* 1.4* 4.3  --     CBC: Recent Labs  Lab 02/25/18 0420 02/25/18 1859 02/26/18 0303 02/27/18 0555 02/28/18 0537  WBC 8.6 14.0* 10.0 9.7 6.1  HGB 7.7* 13.0 12.2 12.2 10.6*  HCT 24.0* 38.1 36.6 36.1 32.6*  MCV 114.8* 96.5 95.1 95.8 98.8  PLT 122* 107* 104* 132* 143*     Coagulation Studies: No results for input(s): LABPROT, INR in the last 72 hours.  Imaging Reviewed:     ASSESSMENT AND PLAN  37 year old female with traumatic subarachnoid  hemorrhage and partial status epilepticus.  Status epilepticus now resolved and patient is alert, speaking appropriately although slightly confused.  Traumatic brain injury Partial status epilepticus - resolved  Plan Continue Keppra, Dilantin and Vimpat Dilantin low at 7.2, repeat level tomorrow - if continues to drop consider additional dose Continue seizure precautions MRI brain or CT head when stable ( defer to Neurosurgery)    Alyssa SpinnerSushanth Corine Little [facility] Pager Number [phone] For questions after 7pm please refer to AMION to reach the Neurologist on call

## 2018-02-28 NOTE — Progress Notes (Signed)
Subjective: The patient is more alert and interactive today.  Her boyfriend is at the bedside.  Objective: Vital signs in last 24 hours: Temp:  [99.5 F (37.5 C)-101.7 F (38.7 C)] 100.8 F (38.2 C) (11/15 0600) Pulse Rate:  [91-127] 122 (11/15 0700) Resp:  [23-59] 52 (11/15 0700) BP: (81-108)/(59-90) 103/81 (11/15 0700) SpO2:  [96 %-100 %] 99 % (11/15 0700) Weight:  [46.6 kg] 46.6 kg (11/15 0500) Estimated body mass index is 19.41 kg/m as calculated from the following:   Height as of this encounter: 5\' 1"  (1.549 m).   Weight as of this encounter: 46.6 kg.   Intake/Output from previous day: 11/14 0701 - 11/15 0700 In: 2109.3 [I.V.:462.1; NG/GT:1035; IV Piggyback:612.2] Out: 1450 [Urine:1150; Stool:300] Intake/Output this shift: No intake/output data recorded.  Physical exam the patient is Glasgow Coma Scale 13, E3M6V4.  She will answer simple questions.  She is moving all 4 extremities well.  Lab Results: Recent Labs    02/27/18 0555 02/28/18 0537  WBC 9.7 6.1  HGB 12.2 10.6*  HCT 36.1 32.6*  PLT 132* 143*   BMET Recent Labs    02/27/18 0555 02/28/18 0537  NA 137 139  K 2.6* 4.2  CL 102 113*  CO2 26 21*  GLUCOSE 123* 131*  BUN <5* <5*  CREATININE 0.44 0.46  CALCIUM 7.0* 6.8*    Studies/Results: Dg Chest Port 1 View  Result Date: 02/26/2018 CLINICAL DATA:  Follow-up pneumonia. EXAM: PORTABLE CHEST 1 VIEW COMPARISON:  02/25/2018 and 02/24/2018. FINDINGS: Since prior studies, bilateral hazy perihilar opacities, left greater than right, have developed, along with interstitial prominence mild vascular congestion. No gross ORIF a pleural effusion on this supine exam. Cardiac silhouette is normal in size. No mediastinal or hilar masses. Previous day's study, the endotracheal tube/orogastric tube have been removed IMPRESSION: 1. Interval worsening of lung aeration when compared prior exams. However, the current exam is supine and the previous day's exam with  semi-erect. This position change may account for some of the apparent radiographic change. Current exam shows hazy bilateral central lung opacities, greater on the left, and vascular/interstitial prominence. The hazy opacities could reflect layering pleural effusions or could be due to atelectasis or asymmetric edema. The single day change argues against pneumonia. Electronically Signed   By: Amie Portlandavid  Ormond M.D.   On: 02/26/2018 11:11    Assessment/Plan: Manic brain injury, traumatic subarachnoid hemorrhage: The patient is logically.  I will plan to repeat her CAT scan sometime next week.  LOS: 4 days     Cristi LoronJeffrey D Eyad Rochford 02/28/2018, 7:48 AM

## 2018-02-28 NOTE — Evaluation (Signed)
Clinical/Bedside Swallow Evaluation Patient Details  Name: Alyssa Little MRN: [id number] Date of Birth: 01/05/1981  Today's Date: 02/28/2018 Time: SLP Start Time (ACUTE ONLY): 1445 SLP Stop Time (ACUTE ONLY): 1455 SLP Time Calculation (min) (ACUTE ONLY): 10 min  Past Medical History: No past medical history on file.  HPI:   [age] yof admitted from work at [employer] with unwitnessed falls with seizures and extensive traumatic SAH. Intubated 11/11-11/12.  CT 11/12: Interval decrease in moderate volume SAH as compared to previous exam. Increased intraventricular blood consistent with redistribution.  Evolving small bifrontal hemorrhagic contusions as above, right slightly larger than left. No significant mass effect.   Assessment / Plan / Recommendation Clinical Impression  Pt sufficiently alert for clinical swallow assessment.  No focal CN deficits; remains in cervical collar.  Followed basic commands today and verbalized intelligibly; remains restless, pulling at lines.  RR ranged mid 40s-lower 50s, creating concern for adequate time for airway closure with POs.  Pt able to decrease rate to 20s using incentive spirometer, but unable to follow commands to lower rate otherwise.  She consumed three oz of water from a straw with no overt s/s of aspiration - appeared to be protecting airway well.  She declined pudding, graham crackers as restlessness increased.  Pt's MS, ability to focus attention, and tachypnea are primary obstacles to oral nutrition. Recommend allowing liquids per pt's preference; consider leaving cortrak until pt can maintain MS in order to meet needs via PO alone.  D/W pt's grandparents, boyfriend [place] RiverP[place], and Charity fundraiserN.       Aspiration Risk  Mild aspiration risk    Diet Recommendation  allow liquids per pt's preferences  Medication Administration: Whole meds with puree    Other  Recommendations Oral Care Recommendations: Oral care QID   Follow up Recommendations Inpatient Rehab       Frequency and Duration min 3x week  2 weeks       Prognosis Prognosis for Safe Diet Advancement: Good      Swallow Study   General Date of Onset: 02/24/18 HPI:  [age] yof admitted from work at [employer] with unwitnessed falls with seizures and extensive traumatic SAH. Intubated 11/11-11/12.  CT 11/12: Interval decrease in moderate volume SAH as compared to previous exam. Increased intraventricular blood consistent with redistribution.  Evolving small bifrontal hemorrhagic contusions as above, right slightly larger than left. No significant mass effect. Type of Study: Bedside Swallow Evaluation Previous Swallow Assessment: no Diet Prior to this Study: NPO Temperature Spikes Noted: No Respiratory Status: Room air Behavior/Cognition: Alert;Agitated;Impulsive;Distractible Oral Cavity Assessment: Within Functional Limits Oral Care Completed by SLP: Recent completion by staff Oral Cavity - Dentition: Adequate natural dentition Vision: Functional for self-feeding Self-Feeding Abilities: Needs assist Patient Positioning: Upright in chair Baseline Vocal Quality: Low vocal intensity Volitional Cough: Weak Volitional Swallow: Able to elicit    Oral/Motor/Sensory Function Overall Oral Motor/Sensory Function: Within functional limits   Ice Chips Ice chips: Not tested   Thin Liquid Thin Liquid: Within functional limits Presentation: Straw    Nectar Thick Nectar Thick Liquid: Not tested   Honey Thick Honey Thick Liquid: Not tested   Puree Puree: Not tested   Solid     Solid: Not tested      Blenda Mountsouture, Dalyla Chui Laurice 02/28/2018,3:17 PM   Marchelle FolksAmanda L. Samson Fredericouture, MA CCC/SLP Acute Rehabilitation Services Office number [phone] Pager [phone]

## 2018-03-01 ENCOUNTER — Inpatient Hospital Stay (HOSPITAL_COMMUNITY): Payer: Managed Care, Other (non HMO)

## 2018-03-01 DIAGNOSIS — G40909 Epilepsy, unspecified, not intractable, without status epilepticus: Secondary | ICD-10-CM

## 2018-03-01 DIAGNOSIS — D62 Acute posthemorrhagic anemia: Secondary | ICD-10-CM

## 2018-03-01 DIAGNOSIS — R569 Unspecified convulsions: Secondary | ICD-10-CM

## 2018-03-01 DIAGNOSIS — S069X9A Unspecified intracranial injury with loss of consciousness of unspecified duration, initial encounter: Secondary | ICD-10-CM

## 2018-03-01 DIAGNOSIS — R000 Tachycardia, unspecified: Secondary | ICD-10-CM

## 2018-03-01 DIAGNOSIS — I609 Nontraumatic subarachnoid hemorrhage, unspecified: Secondary | ICD-10-CM

## 2018-03-01 DIAGNOSIS — R0682 Tachypnea, not elsewhere classified: Secondary | ICD-10-CM

## 2018-03-01 LAB — BASIC METABOLIC PANEL
ANION GAP: 7 (ref 5–15)
Anion gap: 6 (ref 5–15)
BUN: 5 mg/dL — ABNORMAL LOW (ref 6–20)
BUN: 5 mg/dL — AB (ref 6–20)
CALCIUM: 7.5 mg/dL — AB (ref 8.9–10.3)
CO2: 19 mmol/L — ABNORMAL LOW (ref 22–32)
CO2: 21 mmol/L — ABNORMAL LOW (ref 22–32)
CREATININE: 0.58 mg/dL (ref 0.44–1.00)
Calcium: 7.2 mg/dL — ABNORMAL LOW (ref 8.9–10.3)
Chloride: 112 mmol/L — ABNORMAL HIGH (ref 98–111)
Chloride: 109 mmol/L (ref 98–111)
Creatinine, Ser: 0.46 mg/dL (ref 0.44–1.00)
GFR calc Af Amer: >60 mL/min (ref >60)
GFR calc Af Amer: >60 mL/min (ref >60)
GFR calc non Af Amer: >60 mL/min (ref >60)
GLUCOSE: 138 mg/dL — AB (ref 70–99)
Glucose, Bld: 114 mg/dL — ABNORMAL HIGH (ref 70–99)
Potassium: 4.2 mmol/L (ref 3.5–5.1)
Potassium: 3.7 mmol/L (ref 3.5–5.1)
Sodium: 137 mmol/L (ref 135–145)
Sodium: 137 mmol/L (ref 135–145)

## 2018-03-01 LAB — GLUCOSE, CAPILLARY
GLUCOSE-CAPILLARY: 106 mg/dL — AB (ref 70–99)
Glucose-Capillary: 138 mg/dL — ABNORMAL HIGH (ref 70–99)
Glucose-Capillary: 120 mg/dL — ABNORMAL HIGH (ref 70–99)
Glucose-Capillary: 108 mg/dL — ABNORMAL HIGH (ref 70–99)
Glucose-Capillary: 146 mg/dL — ABNORMAL HIGH (ref 70–99)

## 2018-03-01 LAB — CBC
HCT: 35.7 % — ABNORMAL LOW (ref 36.0–46.0)
Hemoglobin: 11.6 g/dL — ABNORMAL LOW (ref 12.0–15.0)
MCH: 32 pg (ref 26.0–34.0)
MCHC: 32.5 g/dL (ref 30.0–36.0)
MCV: 98.3 fL (ref 80.0–100.0)
PLATELETS: 188 10*3/uL (ref 150–400)
RBC: 3.63 MIL/uL — ABNORMAL LOW (ref 3.87–5.11)
RDW: 20.5 % — AB (ref 11.5–15.5)
WBC: 8.2 10*3/uL (ref 4.0–10.5)
nRBC: 0 % (ref 0.0–0.2)

## 2018-03-01 LAB — PHOSPHORUS
Phosphorus: <1 mg/dL — CL (ref 2.5–4.6)
Phosphorus: <1 mg/dL — CL (ref 2.5–4.6)
Phosphorus: 2.6 mg/dL (ref 2.5–4.6)

## 2018-03-01 LAB — PHENYTOIN LEVEL, TOTAL: Phenytoin Lvl: 3.4 ug/mL — ABNORMAL LOW (ref 10.0–20.0)

## 2018-03-01 LAB — MAGNESIUM: Magnesium: 1.9 mg/dL (ref 1.7–2.4)

## 2018-03-01 MED ORDER — PHENYTOIN 125 MG/5ML PO SUSP
125.0000 mg | Freq: Three times a day (TID) | ORAL | Status: DC
  Administered 2018-03-01: 125 mg
  Filled 2018-03-01 (×3): qty 5

## 2018-03-01 MED ORDER — PHENYTOIN 125 MG/5ML PO SUSP
125.0000 mg | Freq: Three times a day (TID) | ORAL | Status: DC
  Administered 2018-03-02 – 2018-03-03 (×5): 125 mg
  Filled 2018-03-01 (×8): qty 8
  Filled 2018-03-01: qty 5

## 2018-03-01 MED ORDER — SODIUM PHOSPHATES 45 MMOLE/15ML IV SOLN
30.0000 mmol | Freq: Once | INTRAVENOUS | Status: AC
  Administered 2018-03-01: 30 mmol via INTRAVENOUS
  Filled 2018-03-01: qty 10

## 2018-03-01 MED ORDER — PHENYTOIN 125 MG/5ML PO SUSP
125.0000 mg | Freq: Three times a day (TID) | ORAL | Status: DC
  Filled 2018-03-01 (×2): qty 5

## 2018-03-01 NOTE — Procedures (Signed)
Cortrak  Person Inserting Tube:  Christophe LouisFranks, Cloyce Blankenhorn A, RD Tube Type:  Cortrak - 43 inches Tube Location:  Left nare Initial Placement:  Stomach Secured by: Bridle Technique Used to Measure Tube Placement:  Documented cm marking at nare/ corner of mouth Cortrak Secured At:  69 cm    RD was paged for dislodged NGT. Apparently pt was able to pull out cortrak tube approximately ~10-15 cm. From prior documentation, it was at 72 cm at nare. It was just below 60 when RD assessed.   Undid bridle and advanced to 69 cm. Patient would readily grimace with any advancement, therefore, did not pursue postpyloric access. Per cortrak monitor, tip is located in distal stomach .   If the tube becomes dislodged please keep the tube and contact the Cortrak team at [email or web address] (password [email or web address]) for replacement.  If after hours and replacement cannot be delayed, place a NG tube and confirm placement with an abdominal x-ray.   Christophe LouisNathan Opal Dinning RD, LDN, CNSC Clinical Nutrition Available Tues-Sat via Pager: [phone] 03/01/2018 2:26 PM

## 2018-03-01 NOTE — Progress Notes (Signed)
MEDICATION RELATED CONSULT NOTE - INITIAL   Pharmacy Consult for phenytoin Indication: seizures  Labs: Recent Labs    02/27/18 0555 02/27/18 1703 02/28/18 0537 03/01/18 0316 03/01/18 0622  WBC 9.7  --  6.1  --  8.2  HGB 12.2  --  10.6*  --  11.6*  HCT 36.1  --  32.6*  --  35.7*  PLT 132*  --  143*  --  188  CREATININE 0.44  --  0.46 0.46 0.58  MG 1.1* 2.2  --  1.9  --   PHOS 1.4* 4.3  --  <1.0* <1.0*  ALBUMIN  --   --  2.2*  --   --   PROT  --   --  5.0*  --   --   AST  --   --  54*  --   --   ALT  --   --  26  --   --   ALKPHOS  --   --  76  --   --   BILITOT  --   --  0.7  --   --    Estimated Creatinine Clearance: 69.8 mL/min (by C-G formula based on SCr of 0.58 mg/dL).  Assessment: 37 yo f presenting with seizures. Now on keppra, vimpat, and phenytoin.  Extubated now, receiving meds/TF through cortrak  Phenytoin lvl low this am but stable  Goal of Therapy:  Phenytoin 10 - 20 (corrected)  Plan:  DC IV phenytoin Convert to phenytoin per tube 125 mg q8 F/U repeat lvl Monday AM  Isaac BlissMichael Aalayah Riles, PharmD, BCPS, BCCCP Clinical Pharmacist [phone]  Please check AMION for all [facility] numbers  03/01/2018 10:09 AM

## 2018-03-01 NOTE — Progress Notes (Signed)
Patient scheduled to go to x-ray. Patient assessed with increased agitation and uncooperative, verbally aggressive and attempting to pull at invasive lines and get out of bed without assistance. Increased precedex gtt to 0.207mcg/hr. CIWA score 15 and PRN ativan given with good relief. Will continue to monitor and coordinate x-ray as able.

## 2018-03-01 NOTE — Progress Notes (Signed)
Neurosurgery Service Progress Note  Subjective: No acute events overnight, no new complaints   Objective: Vitals:   03/01/18 0000 03/01/18 0400 03/01/18 0424 03/01/18 0820  BP: 98/79   109/90  Pulse: (!) 103   (!) 115  Resp: (!) 41   17  Temp: 98.1 F (36.7 C) 98.5 F (36.9 C)    TempSrc: Axillary Axillary    SpO2: 96%   97%  Weight:   46.4 kg   Height:       Temp (24hrs), Avg:98.2 F (36.8 C), Min:97 F (36.1 C), Max:99.4 F (37.4 C)  CBC Latest Ref Rng & Units 03/01/2018 02/28/2018 02/27/2018  WBC 4.0 - 10.5 K/uL 8.2 6.1 9.7  Hemoglobin 12.0 - 15.0 g/dL 11.6(L) 10.6(L) 12.2  Hematocrit 36.0 - 46.0 % 35.7(L) 32.6(L) 36.1  Platelets 150 - 400 K/uL 188 143(L) 132(L)   BMP Latest Ref Rng & Units 03/01/2018 03/01/2018 02/28/2018  Glucose 70 - 99 mg/dL 119(J) 478(G) 956(O)  BUN 6 - 20 mg/dL 5(L) 5(L) <1(H)  Creatinine 0.44 - 1.00 mg/dL 0.86 5.78 4.69  Sodium 135 - 145 mmol/L 137 137 139  Potassium 3.5 - 5.1 mmol/L 3.7 4.2 4.2  Chloride 98 - 111 mmol/L 109 112(H) 113(H)  CO2 22 - 32 mmol/L 21(L) 19(L) 21(L)  Calcium 8.9 - 10.3 mg/dL 7.5(L) 7.2(L) 6.8(L)    Intake/Output Summary (Last 24 hours) at 03/01/2018 1149 Last data filed at 03/01/2018 1116 Gross per 24 hour  Intake 1159.07 ml  Output 800 ml  Net 359.07 ml    Current Facility-Administered Medications:  .  0.9 % NaCl with KCl 20 mEq/ L  infusion, , Intravenous, Continuous, Gaynelle Adu, MD, Last Rate: 100 mL/hr at 02/28/18 1830 .  acetaminophen (TYLENOL) suppository 650 mg, 650 mg, Rectal, Q4H PRN, Rejeana Brock, MD, 650 mg at 02/25/18 2312 .  chlorhexidine (PERIDEX) 0.12 % solution 15 mL, 15 mL, Mouth Rinse, BID, Jimmye Norman, MD, 15 mL at 03/01/18 0955 .  dexmedetomidine (PRECEDEX) 200 MCG/50ML (4 mcg/mL) infusion, 0.2-0.7 mcg/kg/hr, Intravenous, Continuous, Focht, Jessica L, PA, Last Rate: 5.74 mL/hr at 03/01/18 0615, 0.5 mcg/kg/hr at 03/01/18 0615 .  feeding supplement (PIVOT 1.5 CAL) liquid 1,000  mL, 1,000 mL, Per Tube, Q24H, Barnetta Chapel, PA-C, Last Rate: 45 mL/hr at 02/28/18 1342, 1,000 mL at 02/28/18 1342 .  folic acid (FOLVITE) tablet 1 mg, 1 mg, Per Tube, Daily, Scarlett Presto, RPH, 1 mg at 03/01/18 1015 .  Influenza vac split quadrivalent PF (FLUARIX) injection 0.5 mL, 0.5 mL, Intramuscular, Tomorrow-1000, Barnetta Chapel, PA-C .  lacosamide (VIMPAT) 200 mg in sodium chloride 0.9 % 25 mL IVPB, 200 mg, Intravenous, Q12H, Rejeana Brock, MD, Last Rate: 90 mL/hr at 03/01/18 0956, 200 mg at 03/01/18 0956 .  levETIRAcetam (KEPPRA) IVPB 1500 mg/ 100 mL premix, 1,500 mg, Intravenous, Q12H, Rejeana Brock, MD, Last Rate: 400 mL/hr at 03/01/18 0732, 1,500 mg at 03/01/18 0732 .  LORazepam (ATIVAN) tablet 1 mg, 1 mg, Oral, Q6H PRN **OR** LORazepam (ATIVAN) injection 1 mg, 1 mg, Intravenous, Q3H PRN, Manus Rudd, MD, 1 mg at 02/28/18 1829 .  MEDLINE mouth rinse, 15 mL, Mouth Rinse, q12n4p, Jimmye Norman, MD, 15 mL at 02/28/18 1443 .  metoprolol tartrate (LOPRESSOR) injection 2.5 mg, 2.5 mg, Intravenous, Q6H PRN, Violeta Gelinas, MD, 2.5 mg at 02/26/18 2059 .  midazolam (VERSED) injection 2-4 mg, 2-4 mg, Intravenous, Q1H PRN, Jimmye Norman, MD, 2 mg at 03/01/18 0610 .  multivitamin liquid 15 mL, 15 mL,  Per Tube, Daily, Scarlett Prestogan, Theresa D, RPH, 15 mL at 03/01/18 1015 .  phenytoin (DILANTIN) 125 MG/5ML suspension 125 mg, 125 mg, Per Tube, TID, Bertram MillardMaccia, Michael A, RPH .  piperacillin-tazobactam (ZOSYN) IVPB 3.375 g, 3.375 g, Intravenous, Q8H, Barnetta ChapelOsborne, Kelly, PA-C, Last Rate: 12.5 mL/hr at 03/01/18 0420, 3.375 g at 03/01/18 0420 .  pneumococcal 23 valent vaccine (PNU-IMMUNE) injection 0.5 mL, 0.5 mL, Intramuscular, Tomorrow-1000, Barnetta Chapelsborne, Kelly, PA-C .  thiamine (VITAMIN B-1) tablet 100 mg, 100 mg, Per Tube, Daily, Scarlett Prestogan, Theresa D, RPH, 100 mg at 03/01/18 1015   Physical Exam: Awake/alert, FCx4 laceration w/ sutures intact, well approximated  Assessment & Plan: 37 y.o. woman with  seizures and TBI. -no change in neurosurgical plan; likely repeat CTH next week, flex-ex C-spine when able  Alyssa Little  03/01/18 11:49 AM

## 2018-03-01 NOTE — Progress Notes (Signed)
Subjective/Chief Complaint: Awake, alert Still slightly disoriented to place and time No complaints of head ache or neck pain Tolerating some clear liquids Cortrak still in place    Objective: Vital signs in last 24 hours: Temp:  [97 F (36.1 C)-99.4 F (37.4 C)] 98.5 F (36.9 C) (11/16 0400) Pulse Rate:  [45-143] 115 (11/16 0820) Resp:  [17-54] 17 (11/16 0820) BP: (94-127)/(71-100) 109/90 (11/16 0820) SpO2:  [95 %-100 %] 97 % (11/16 0820) Weight:  [46.4 kg] 46.4 kg (11/16 0424) Last BM Date: 02/28/18  Intake/Output from previous day: 11/15 0701 - 11/16 0700 In: 4022 [I.V.:2488.5; NG/GT:1035; IV Piggyback:498.5] Out: 500 [Urine:400; Stool:100] Intake/Output this shift: Total I/O In: -  Out: 250 [Urine:250]  Gen - WDWN in NAD Neuro - answers questions, but still disoriented to place and time CV - mild tachy; reg rhythm Cv - RRR Abd - soft, non-tender, non-distended Neck - no tenderness today Scalp lac - sutures intact  Lab Results:  Recent Labs    02/28/18 0537 03/01/18 0622  WBC 6.1 8.2  HGB 10.6* 11.6*  HCT 32.6* 35.7*  PLT 143* 188   BMET Recent Labs    03/01/18 0316 03/01/18 0622  NA 137 137  K 4.2 3.7  CL 112* 109  CO2 19* 21*  GLUCOSE 114* 138*  BUN 5* 5*  CREATININE 0.46 0.58  CALCIUM 7.2* 7.5*   PT/INR No results for input(s): LABPROT, INR in the last 72 hours. ABG No results for input(s): PHART, HCO3 in the last 72 hours.  Invalid input(s): PCO2, PO2  Studies/Results: Dg Chest Port 1 View  Result Date: 02/28/2018 CLINICAL DATA:  Tachypnea.  Febrile. EXAM: PORTABLE CHEST 1 VIEW COMPARISON:  02/26/2018 FINDINGS: Diffuse bilateral airspace disease with mild progression. Small left effusion. Feeding tube placed in the interval with the tip not visualized IMPRESSION: Diffuse bilateral airspace disease with mild progression. Probable edema however pneumonia possible. Electronically Signed   By: Marlan Palauharles  Clark M.D.   On: 02/28/2018 10:21     Anti-infectives: Anti-infectives (From admission, onward)   Start     Dose/Rate Route Frequency Ordered Stop   02/26/18 1200  piperacillin-tazobactam (ZOSYN) IVPB 3.375 g     3.375 g 12.5 mL/hr over 240 Minutes Intravenous Every 8 hours 02/26/18 1001        Assessment/Plan: Fall TBI/SAH/B F ICC/IVH- Dr. Lovell SheehanJenkins following, TBI team therapies, plans for repeat CT of head next week Seizure likely due to above- Keppra, Dilantin and Vimpat,EEG completed, neurology following, MRI when stable Complex scalp lac- S/P closure 11/11 by Dr. Andrey CampanileWilson Acute hypoxic ventilator dependent respiratory failure-extubated ABL anemia-hgb 11.6 CV- tachycardia,on lopressor Alcohol abuse disorder- friend reports she drinks "2 cups" brandy daily,onCIWA, precedex Hypokalemia, hypophosphatemia, hypomag, hypocalcemia- stable  ID: afebrile, Zosyn 11/13>> for possible PNA vs fluid overload, resp culture pending, FEN-Cortrak, TF, will have speech resee patient today to determine if Cortrak can be removed and she can have a diet VTE- PAS Dispo- ICU, TBI team therapies, speech eval pending for swallow, neurology and NS following.  NS felt like she needs flex-ex of her neck as she has bony tenderness.  Will try tomorrow if she continues to improve and can follow directions.  Grandparents arrived today as well and add to her history that she apparently had issues with unintentional weight loss this summer of between 15-20#s.  Boyfriend apparently told them she was having nausea and vomiting, however, she currently denies that.  She denies abdominal pain this summer.  Grandmother states that  her paternal grandmother had "biliary cholangitis" and passed away from this around age 34.  LFTs here are normal, but she does not have any abdominal imaging.  Will likely need outpatient follow up for further work up.  LOS: 5 days    Wynona Luna 03/01/2018

## 2018-03-01 NOTE — Consult Note (Signed)
Physical Medicine and Rehabilitation Consult Reason for Consult: Trauma Referring Physician: Kinsinger, Luke Aaron, MD   HPI: [name] is a 37 y.o. female presented on 02/24/2018 with head trauma.  History taken from chart review.  Patient reportedly with fall and seizures.  EMS was called and patient received antiepileptics.  She required intubation.  Head CT reviewed showing SAH.  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  Neurology was consulted.  EEG ordered.  Neurosurgery consulted, plan was to place a ventriculostomy, however patient's neurological status improved.  Recommended conservative care with follow-up head CT next week and spine x-rays.    Review of Systems  Unable to perform ROS: Mental acuity   No past medical history on file.,  Unable to obtain from patient No past surgical history on file, unable to obtain from patient No family history on file.,  Unable to obtain from patient Social History:  has no tobacco, alcohol, and drug history on file. Allergies:       Allergies  Allergen Reactions  . Shellfish Allergy Other (See Comments)    Unknown per family and friend   No medications prior to admission.    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  Transfers Overall transfer level: Needs assistance Equipment used: None Transfers: Sit to/from Stand, Stand Pivot Transfers Sit to Stand: Mod assist, +2 physical assistance Stand pivot transfers: Mod assist, +2 physical assistance General transfer comment: Two person light mod assist to stand and take a few pivotal steps around to the recliner chair with therapists supporting trunk over weak legs.  Pt did not really hold onto us with her arms, took most of her weight through her feet.  Ambulation/Gait General Gait Details: unable at this time.   ADL: ADL Overall ADL's : Needs assistance/impaired Eating/Feeding: NPO Grooming: Moderate assistance, Oral care, Wash/dry face Grooming Details (indicate cue type and reason): cues to sustain attention to task due to lethargy Functional mobility during ADLs: +2 for physical assistance, Moderate assistance General ADL Comments: Pt ableto intiate stand and step to chair. Increased attention to ADL tasks  Cognition: Cognition Overall Cognitive Status: Impaired/Different from baseline Arousal/Alertness: Awake/alert Orientation Level: Oriented to person, Disoriented to place, Disoriented to time, Disoriented to situation Attention: Focused Focused Attention: Impaired Focused Attention Impairment: Verbal basic Memory: Impaired Memory Impairment: Storage deficit Awareness: Impaired Awareness Impairment: Intellectual impairment Behaviors: Restless, Physical agitation, Poor frustration tolerance Safety/Judgment: Impaired Rancho Los Amigos Scales of Cognitive Functioning: Confused/inappropriate/non-agitated(more V than IV, but still seems to have some IV moments) Cognition Arousal/Alertness:  Lethargic Behavior During Therapy: Restless, Flat affect Overall Cognitive Status: Impaired/Different from baseline Area of Impairment: Orientation, Attention, Memory, Following commands, Awareness, Safety/judgement, Problem solving, Rancho level Orientation Level: Disoriented to, Place, Time, Situation("hospital") Current Attention Level: Sustained   Memory: Decreased recall of precautions, Decreased short-term memory Following Commands: Follows one step commands consistently, Follows one step commands with increased time Safety/Judgement: Decreased awareness of safety, Decreased awareness of deficits Awareness: Intellectual Problem Solving: Slow processing, Decreased initiation, Difficulty sequencing, Requires verbal cues, Requires tactile cues General Comments: Precedex turned down but not off; ablet o sutain attention to task for short periods of time; Able to sustain attneiton to brush teeth; pt appeasr to perseverate at times; Difficulty maintaining arousal throughout session which is most likely affected by medication in addition to TBI.  Eyes open ~25% of time, seems to be light sensative, expressing some basic needs, recognized her grandmother  Blood pressure 104/85, pulse 98, temperature 97.9 F (36.6 C), resp. rate (!) 37, height 5' 1" (1.549 m), weight 46.4 kg, last menstrual period 11/29/2017, SpO2 96 %. Physical Exam  Constitutional: She appears well-developed and well-nourished.  HENT:  Traumatic + NG  Eyes: Right eye exhibits no discharge. Left eye exhibits no discharge.  Neck:  C-collar in place  Cardiovascular: Regular rhythm.  Tachycardia  Respiratory: Breath sounds normal.  Tachypnea  GI: Soft. Bowel sounds are normal.  Musculoskeletal: She exhibits no edema or tenderness.  Neurological: She is alert.  Incomprehensible verbal output Moving all extremities spontaneously  Skin:  Head trauma  Psychiatric:  Unable to assess due to mentation         Results for  orders placed or performed during the hospital encounter of 02/24/18 (from the past 24 hour(s))  Glucose, capillary     Status: Abnormal   Collection Time: 02/28/18  7:53 PM  Result Value Ref Range   Glucose-Capillary 134 (H) 70 - 99 mg/dL  Glucose, capillary     Status: Abnormal   Collection Time: 02/28/18 11:41 PM  Result Value Ref Range   Glucose-Capillary 142 (H) 70 - 99 mg/dL  Basic metabolic panel     Status: Abnormal   Collection Time: 03/01/18  3:16 AM  Result Value Ref Range   Sodium 137 135 - 145 mmol/L   Potassium 4.2 3.5 - 5.1 mmol/L   Chloride 112 (H) 98 - 111 mmol/L   CO2 19 (L) 22 - 32 mmol/L   Glucose, Bld 114 (H) 70 - 99 mg/dL   BUN 5 (L) 6 - 20 mg/dL   Creatinine, Ser 0.46 0.44 - 1.00 mg/dL   Calcium 7.2 (L) 8.9 - 10.3 mg/dL   GFR calc non Af Amer >60 >60 mL/min   GFR calc Af Amer >60 >60 mL/min   Anion gap 6 5 - 15  Magnesium     Status: None   Collection Time: 03/01/18  3:16 AM  Result Value Ref Range   Magnesium 1.9 1.7 - 2.4 mg/dL  Phosphorus     Status: Abnormal   Collection Time: 03/01/18  3:16 AM  Result Value Ref Range   Phosphorus <1.0 (LL) 2.5 - 4.6 mg/dL  Glucose, capillary     Status: Abnormal   Collection Time: 03/01/18  3:26 AM  Result Value Ref Range   Glucose-Capillary 106 (H) 70 - 99 mg/dL  Basic metabolic panel     Status: Abnormal   Collection Time: 03/01/18  6:22 AM  Result Value Ref Range   Sodium 137 135 - 145 mmol/L   Potassium 3.7 3.5 - 5.1 mmol/L   Chloride 109 98 - 111 mmol/L   CO2 21 (L) 22 - 32 mmol/L   Glucose, Bld 138 (H) 70 - 99 mg/dL   BUN 5 (L)   6 - 20 mg/dL   Creatinine, Ser 0.58 0.44 - 1.00 mg/dL   Calcium 7.5 (L) 8.9 - 10.3 mg/dL   GFR calc non Af Amer >60 >60 mL/min   GFR calc Af Amer >60 >60 mL/min   Anion gap 7 5 - 15  Phosphorus     Status: Abnormal   Collection Time: 03/01/18  6:22 AM  Result Value Ref Range   Phosphorus <1.0 (LL) 2.5 - 4.6 mg/dL  CBC     Status: Abnormal    Collection Time: 03/01/18  6:22 AM  Result Value Ref Range   WBC 8.2 4.0 - 10.5 K/uL   RBC 3.63 (L) 3.87 - 5.11 MIL/uL   Hemoglobin 11.6 (L) 12.0 - 15.0 g/dL   HCT 35.7 (L) 36.0 - 46.0 %   MCV 98.3 80.0 - 100.0 fL   MCH 32.0 26.0 - 34.0 pg   MCHC 32.5 30.0 - 36.0 g/dL   RDW 20.5 (H) 11.5 - 15.5 %   Platelets 188 150 - 400 K/uL   nRBC 0.0 0.0 - 0.2 %  Glucose, capillary     Status: Abnormal   Collection Time: 03/01/18  7:45 AM  Result Value Ref Range   Glucose-Capillary 138 (H) 70 - 99 mg/dL  Phenytoin level, total     Status: Abnormal   Collection Time: 03/01/18  8:54 AM  Result Value Ref Range   Phenytoin Lvl 3.4 (L) 10.0 - 20.0 ug/mL  Glucose, capillary     Status: Abnormal   Collection Time: 03/01/18 11:59 AM  Result Value Ref Range   Glucose-Capillary 120 (H) 70 - 99 mg/dL  Phosphorus     Status: None   Collection Time: 03/01/18  2:38 PM  Result Value Ref Range   Phosphorus 2.6 2.5 - 4.6 mg/dL   Dg Chest Port 1 View  Result Date: 02/28/2018 CLINICAL DATA:  Tachypnea.  Febrile. EXAM: PORTABLE CHEST 1 VIEW COMPARISON:  02/26/2018 FINDINGS: Diffuse bilateral airspace disease with mild progression. Small left effusion. Feeding tube placed in the interval with the tip not visualized IMPRESSION: Diffuse bilateral airspace disease with mild progression. Probable edema however pneumonia possible. Electronically Signed   By: Charles  Clark M.D.   On: 02/28/2018 10:21    Assessment/Plan: Diagnosis: Seizures with TBI Labs and images (see above) independently reviewed.  Records reviewed and summated above.             Ranchos Los Amigos score:  IV             Speech to evaluate for Post traumatic amnesia and interval GOAT scores to assess progress.             NeuroPsych evaluation for behavorial assessment.             Provide environmental management by reducing the level of stimulation, tolerating restlessness when possible, protecting patient from  harming self or others and reducing patient's cognitive confusion.             Address behavioral concerns include providing structured environments and daily routines.             Cognitive therapy to direct modular abilities in order to maintain goals        including problem solving, self regulation/monitoring, self management, attention, and memory.             Fall precautions; pt at risk for second impact syndrome             Prevention of secondary   injury: monitor for hypotension, hypoxia, seizures or signs of increased ICP             AED:              Consider pharmacological intervention if necessary with neurostimulants,  Such as amantadine, methylphenidate, modafinil, etc.             Consider Propranolol for agitation and storming             Avoid medications that could impair cognitive abilities, such as anticholinergics, antihistaminic, benzodiazapines, narcotics, etc when possible   1. Does the need for close, 24 hr/day medical supervision in concert with the patient's rehab needs make it unreasonable for this patient to be served in a less intensive setting? Likely 2. Co-Morbidities requiring supervision/potential complications: tachypnea (monitor RR and O2 Sats with increased physical exertion), Tachycardia (monitor in accordance with pain and increasing activity), seizures (cont meds), ABLA (repeat labs, transfuse to ensure appropriate perfusion for increased activity tolerance) 3. Due to bladder management, bowel management, safety, skin/wound care, disease management, medication administration, pain management and patient education, does the patient require 24 hr/day rehab nursing? Yes 4. Does the patient require coordinated care of a physician, rehab nurse, PT (1-2 hrs/day, 5 days/week), OT (1-2 hrs/day, 5 days/week) and SLP (1-2 hrs/day, 5 days/week) to address physical and functional deficits in the context of the above medical diagnosis(es)? Yes Addressing deficits in the  following areas: balance, endurance, locomotion, strength, transferring, bowel/bladder control, bathing, dressing, feeding, grooming, toileting, cognition, speech, language, swallowing and psychosocial support 5. Can the patient actively participate in an intensive therapy program of at least 3 hrs of therapy per day at least 5 days per week? Potentially 6. The potential for patient to make measurable gains while on inpatient rehab is excellent 7. Anticipated functional outcomes upon discharge from inpatient rehab are supervision  with PT, supervision with OT, supervision with SLP. 8. Estimated rehab length of stay to reach the above functional goals is: 17-20 days. 9. Anticipated D/C setting: Home 10. Anticipated post D/C treatments: HH therapy and Home excercise program 11. Overall Rehab/Functional Prognosis: good  RECOMMENDATIONS: This patient's condition is appropriate for continued rehabilitative care in the following setting: Likely CIR.  Will need to await completion of medical work-up.  If patient's functional deficits persist after completion will consider CIR. Patient has agreed to participate in recommended program. Potentially Note that insurance prior authorization may be required for reimbursement for recommended care.  Comment: Rehab Admissions Coordinator to follow up.   Ruhi Kopke, MD, ABPMR 03/01/2018  

## 2018-03-01 NOTE — Progress Notes (Signed)
Critical phosphorus called by lab. Remains <1.0. Patient currently infusing ordered dose of 30 mmols sodium phosphate. Continuing with current orders. Continuing to monitor.

## 2018-03-01 NOTE — Progress Notes (Signed)
Critical result of phosphorous ?>1.0 called by lab. MD paged to notify.

## 2018-03-02 LAB — BASIC METABOLIC PANEL
ANION GAP: 11 (ref 5–15)
CALCIUM: 6.9 mg/dL — AB (ref 8.9–10.3)
CO2: 17 mmol/L — ABNORMAL LOW (ref 22–32)
Chloride: 109 mmol/L (ref 98–111)
Creatinine, Ser: 0.33 mg/dL — ABNORMAL LOW (ref 0.44–1.00)
GFR calc non Af Amer: >60 mL/min (ref >60)
Glucose, Bld: 107 mg/dL — ABNORMAL HIGH (ref 70–99)
POTASSIUM: 3.2 mmol/L — AB (ref 3.5–5.1)
SODIUM: 137 mmol/L (ref 135–145)

## 2018-03-02 LAB — GLUCOSE, CAPILLARY
GLUCOSE-CAPILLARY: 106 mg/dL — AB (ref 70–99)
GLUCOSE-CAPILLARY: 154 mg/dL — AB (ref 70–99)
GLUCOSE-CAPILLARY: 123 mg/dL — AB (ref 70–99)
Glucose-Capillary: 101 mg/dL — ABNORMAL HIGH (ref 70–99)
Glucose-Capillary: 121 mg/dL — ABNORMAL HIGH (ref 70–99)
Glucose-Capillary: 140 mg/dL — ABNORMAL HIGH (ref 70–99)
Glucose-Capillary: 94 mg/dL (ref 70–99)

## 2018-03-02 LAB — PHOSPHORUS: Phosphorus: 1.9 mg/dL — ABNORMAL LOW (ref 2.5–4.6)

## 2018-03-02 LAB — TRIGLYCERIDES: Triglycerides: 158 mg/dL — ABNORMAL HIGH (ref <150)

## 2018-03-02 LAB — MAGNESIUM: Magnesium: 1.7 mg/dL (ref 1.7–2.4)

## 2018-03-02 MED ORDER — POTASSIUM PHOSPHATE MONOBASIC 500 MG PO TABS
500.0000 mg | ORAL_TABLET | Freq: Three times a day (TID) | ORAL | Status: DC
  Filled 2018-03-02 (×4): qty 1

## 2018-03-02 MED ORDER — POTASSIUM PHOSPHATE MONOBASIC 500 MG PO TABS: 500.0000 mg | ORAL_TABLET | Freq: Three times a day (TID) | ORAL | Status: DC

## 2018-03-02 MED ORDER — MAGNESIUM SULFATE 2 GM/50ML IV SOLN
2.0000 g | Freq: Once | INTRAVENOUS | Status: AC
  Administered 2018-03-02: 2 g via INTRAVENOUS
  Filled 2018-03-02: qty 50

## 2018-03-02 MED ORDER — K PHOS MONO-SOD PHOS DI & MONO 155-852-130 MG PO TABS
500.0000 mg | ORAL_TABLET | Freq: Four times a day (QID) | ORAL | Status: AC
  Administered 2018-03-02 – 2018-03-03 (×6): 500 mg via ORAL
  Filled 2018-03-02 (×6): qty 2

## 2018-03-02 NOTE — Progress Notes (Signed)
Neurosurgery Service Progress Note  Subjective: No acute events overnight, no new complaints   Objective: Vitals:   03/02/18 0500 03/02/18 0600 03/02/18 0700 03/02/18 0800  BP: (!) 114/99 106/84 (!) 119/91 (!) 125/98  Pulse: (!) 107 (!) 102 (!) 127 (!) 122  Resp: (!) 38 (!) 36 (!) 40 (!) 44  Temp:      TempSrc:      SpO2: 94% 96% 96% 96%  Weight:      Height:       Temp (24hrs), Avg:98.7 F (37.1 C), Min:97.9 F (36.6 C), Max:99.5 F (37.5 C)  CBC Latest Ref Rng & Units 03/01/2018 02/28/2018 02/27/2018  WBC 4.0 - 10.5 K/uL 8.2 6.1 9.7  Hemoglobin 12.0 - 15.0 g/dL 11.6(L) 10.6(L) 12.2  Hematocrit 36.0 - 46.0 % 35.7(L) 32.6(L) 36.1  Platelets 150 - 400 K/uL 188 143(L) 132(L)   BMP Latest Ref Rng & Units 03/02/2018 03/01/2018 03/01/2018  Glucose 70 - 99 mg/dL 960(A) 540(J) 811(B)  BUN 6 - 20 mg/dL <1(Y) 5(L) 5(L)  Creatinine 0.44 - 1.00 mg/dL 7.82(N) 5.62 1.30  Sodium 135 - 145 mmol/L 137 137 137  Potassium 3.5 - 5.1 mmol/L 3.2(L) 3.7 4.2  Chloride 98 - 111 mmol/L 109 109 112(H)  CO2 22 - 32 mmol/L 17(L) 21(L) 19(L)  Calcium 8.9 - 10.3 mg/dL 6.9(L) 7.5(L) 7.2(L)    Intake/Output Summary (Last 24 hours) at 03/02/2018 0904 Last data filed at 03/02/2018 0800 Gross per 24 hour  Intake 5117.19 ml  Output 700 ml  Net 4417.19 ml    Current Facility-Administered Medications:  .  0.9 % NaCl with KCl 20 mEq/ L  infusion, , Intravenous, Continuous, Kinsinger, De Blanch, MD, Stopped at 03/02/18 0755 .  acetaminophen (TYLENOL) suppository 650 mg, 650 mg, Rectal, Q4H PRN, Rejeana Brock, MD, 650 mg at 02/25/18 2312 .  chlorhexidine (PERIDEX) 0.12 % solution 15 mL, 15 mL, Mouth Rinse, BID, Jimmye Norman, MD, 15 mL at 03/01/18 2125 .  dexmedetomidine (PRECEDEX) 200 MCG/50ML (4 mcg/mL) infusion, 0.2-0.7 mcg/kg/hr, Intravenous, Continuous, Focht, Jessica L, PA, Stopped at 03/02/18 0755 .  feeding supplement (PIVOT 1.5 CAL) liquid 1,000 mL, 1,000 mL, Per Tube, Q24H, Barnetta Chapel, PA-C, Last Rate: 45 mL/hr at 03/01/18 1437, 1,000 mL at 03/01/18 1437 .  folic acid (FOLVITE) tablet 1 mg, 1 mg, Per Tube, Daily, Scarlett Presto, RPH, 1 mg at 03/01/18 1015 .  Influenza vac split quadrivalent PF (FLUARIX) injection 0.5 mL, 0.5 mL, Intramuscular, Tomorrow-1000, Barnetta Chapel, PA-C .  lacosamide (VIMPAT) 200 mg in sodium chloride 0.9 % 25 mL IVPB, 200 mg, Intravenous, Q12H, Rejeana Brock, MD, Stopped at 03/01/18 2157 .  levETIRAcetam (KEPPRA) IVPB 1500 mg/ 100 mL premix, 1,500 mg, Intravenous, Q12H, Rejeana Brock, MD, Last Rate: 400 mL/hr at 03/02/18 0844, 1,500 mg at 03/02/18 0844 .  LORazepam (ATIVAN) tablet 1 mg, 1 mg, Oral, Q6H PRN **OR** LORazepam (ATIVAN) injection 1 mg, 1 mg, Intravenous, Q3H PRN, Manus Rudd, MD, 1 mg at 03/02/18 0501 .  magnesium sulfate IVPB 2 g 50 mL, 2 g, Intravenous, Once, Kinsinger, De Blanch, MD .  MEDLINE mouth rinse, 15 mL, Mouth Rinse, q12n4p, Jimmye Norman, MD, 15 mL at 03/01/18 1202 .  metoprolol tartrate (LOPRESSOR) injection 2.5 mg, 2.5 mg, Intravenous, Q6H PRN, Violeta Gelinas, MD, 2.5 mg at 02/26/18 2059 .  midazolam (VERSED) injection 2-4 mg, 2-4 mg, Intravenous, Q1H PRN, Jimmye Norman, MD, 2 mg at 03/01/18 0610 .  multivitamin liquid 15 mL, 15 mL,  Per Tube, Daily, Scarlett Prestogan, Theresa D, RPH, 15 mL at 03/01/18 1015 .  phenytoin (DILANTIN) 125 MG/5ML suspension 125 mg, 125 mg, Per Tube, TID, Milon DikesArora, Ashish, MD, 125 mg at 03/02/18 0040 .  piperacillin-tazobactam (ZOSYN) IVPB 3.375 g, 3.375 g, Intravenous, Q8H, Barnetta ChapelOsborne, Kelly, PA-C, Last Rate: 12.5 mL/hr at 03/02/18 0800 .  pneumococcal 23 valent vaccine (PNU-IMMUNE) injection 0.5 mL, 0.5 mL, Intramuscular, Tomorrow-1000, Barnetta Chapelsborne, Kelly, PA-C .  potassium phosphate (monobasic) (K-PHOS ORIGINAL) tablet 500 mg, 500 mg, Oral, TID WC & HS, Kinsinger, De BlanchLuke Aaron, MD .  thiamine (VITAMIN B-1) tablet 100 mg, 100 mg, Per Tube, Daily, Scarlett Prestogan, Theresa D, RPH, 100 mg at 03/01/18  1015   Physical Exam: Awake/alert, FCx4 laceration w/ sutures intact, well approximated  Assessment & Plan: 37 y.o. woman with seizures and TBI. -no change in neurosurgical plan; Dr. Lovell SheehanJenkins will likely update the neurosurgical plan tomorrow regarding [facility] and any need for further cervical spine imaging   Clovis Puhomas A Ostergard  03/02/18 9:04 AM

## 2018-03-02 NOTE — Progress Notes (Signed)
Follow up - Trauma and Critical Care  Patient Details:    Alyssa Little is an 37 y.o. female.  Lines/tubes : External Urinary Catheter (Active)  Collection Container Dedicated Suction Canister 03/02/2018  4:00 AM  Output (mL) 100 mL 03/02/2018  6:00 AM    Microbiology/Sepsis markers: Results for orders placed or performed during the hospital encounter of 02/24/18  MRSA PCR Screening     Status: None   Collection Time: 02/24/18 10:20 PM  Result Value Ref Range Status   MRSA by PCR NEGATIVE NEGATIVE Final    Comment:        The GeneXpert MRSA Assay (FDA approved for NASAL specimens only), is one component of a comprehensive MRSA colonization surveillance program. It is not intended to diagnose MRSA infection nor to guide or monitor treatment for MRSA infections. Performed at [facility], [address] [place], [place] [address]     Anti-infectives:  Anti-infectives (From admission, onward)   Start     Dose/Rate Route Frequency Ordered Stop   02/26/18 1200  piperacillin-tazobactam (ZOSYN) IVPB 3.375 g     3.375 g 12.5 mL/hr over 240 Minutes Intravenous Every 8 hours 02/26/18 1001        Best Practice/Protocols:  VTE Prophylaxis: Mechanical CIWA protocol  Consults: Treatment Team:  Kym Groomriadhosp, Neuro1, MD Tressie StalkerJenkins, Jeffrey, MD    Events:  Chief Complaint/Subjective:    Overnight Issues: Agitated at 4am this morning and increased precedex  Objective:  Vital signs for last 24 hours: Temp:  [97.9 F (36.6 C)-99.5 F (37.5 C)] 99 F (37.2 C) (11/17 0800) Pulse Rate:  [98-129] 122 (11/17 0800) Resp:  [17-44] 44 (11/17 0800) BP: (94-128)/(72-105) 125/98 (11/17 0800) SpO2:  [94 %-99 %] 96 % (11/17 0800) Weight:  [47.5 kg] 47.5 kg (11/17 0427)  Hemodynamic parameters for last 24 hours:    Intake/Output from previous day: 11/16 0701 - 11/17 0700 In: 5341.9 [P.O.:723; I.V.:2381.2; NG/GT:1489.8; IV Piggyback:748] Out: 950  [Urine:950]  Intake/Output this shift: Total I/O In: 56.6 [I.V.:44.2; IV Piggyback:12.4] Out: -   Vent settings for last 24 hours:    Physical Exam:  Gen: NAD HEENT: collar in place, removed after confirmed flex/ex read and lack of pain on exam Resp: CTAB Cardiovascular: RRR Abdomen: soft, NT, ND Ext: no edema Neuro: confabulation, answers simple questions appropriately, GCS 15  Results for orders placed or performed during the hospital encounter of 02/24/18 (from the past 24 hour(s))  Glucose, capillary     Status: Abnormal   Collection Time: 03/01/18 11:59 AM  Result Value Ref Range   Glucose-Capillary 120 (H) 70 - 99 mg/dL  Phosphorus     Status: None   Collection Time: 03/01/18  2:38 PM  Result Value Ref Range   Phosphorus 2.6 2.5 - 4.6 mg/dL  Glucose, capillary     Status: Abnormal   Collection Time: 03/01/18  4:43 PM  Result Value Ref Range   Glucose-Capillary 108 (H) 70 - 99 mg/dL  Glucose, capillary     Status: Abnormal   Collection Time: 03/01/18  7:29 PM  Result Value Ref Range   Glucose-Capillary 146 (H) 70 - 99 mg/dL  Glucose, capillary     Status: Abnormal   Collection Time: 03/02/18 12:08 AM  Result Value Ref Range   Glucose-Capillary 101 (H) 70 - 99 mg/dL  Basic metabolic panel     Status: Abnormal   Collection Time: 03/02/18  2:14 AM  Result Value Ref Range   Sodium 137 135 -  145 mmol/L   Potassium 3.2 (L) 3.5 - 5.1 mmol/L   Chloride 109 98 - 111 mmol/L   CO2 17 (L) 22 - 32 mmol/L   Glucose, Bld 107 (H) 70 - 99 mg/dL   BUN <5 (L) 6 - 20 mg/dL   Creatinine, Ser 1.61 (L) 0.44 - 1.00 mg/dL   Calcium 6.9 (L) 8.9 - 10.3 mg/dL   GFR calc non Af Amer >60 >60 mL/min   GFR calc Af Amer >60 >60 mL/min   Anion gap 11 5 - 15  Phosphorus     Status: Abnormal   Collection Time: 03/02/18  2:14 AM  Result Value Ref Range   Phosphorus 1.9 (L) 2.5 - 4.6 mg/dL  Glucose, capillary     Status: None   Collection Time: 03/02/18  3:08 AM  Result Value Ref Range    Glucose-Capillary 94 70 - 99 mg/dL  Magnesium     Status: None   Collection Time: 03/02/18  5:48 AM  Result Value Ref Range   Magnesium 1.7 1.7 - 2.4 mg/dL  Glucose, capillary     Status: Abnormal   Collection Time: 03/02/18  8:15 AM  Result Value Ref Range   Glucose-Capillary 106 (H) 70 - 99 mg/dL   Comment 1 Notify RN    Comment 2 Document in Chart      Assessment/Plan:  Fall TBI/SAH/B F ICC/IVH- Dr. Lovell Sheehan following, TBI team therapies, plans for repeat CT of head next week Cervicalgia - not complaining of pain today, flex/ex negative 11/16, collar removed Seizure likely due to above- Keppra, Dilantin and Vimpat,EEGcompleted, neurology following, MRI when stable Complex scalp lac- S/P closure 11/11 by Dr. Andrey Campanile ABL anemia-hgb 11.6 CV- tachycardia,on lopressor Alcohol abuse disorder- friend reports she drinks "2 cups" brandy daily,onCIWA, precedex Hypokalemia, hypophosphatemia, hypomag, hypocalcemia-replace today, repeat lytes tomorrow  ID: afebrile, Zosyn 11/13>> for possible PNAvs fluid overload, resp culture pending, FEN-Cortrak, TF, currently on clear liquids, SLP note for liquids as patient preference, hopefully now that collar is removed and she is not tachypneic she can progress to diet soon VTE- PAS Dispo- ICU, TBI team therapies, speech eval pending for swallow, neurology and NSfollowing. Will try to wean precedex today with potential for transfer out of icu in next 48h   LOS: 6 days   Additional comments:I reviewed the patient's new clinical lab test results. hypophosphatemia  Critical Care Total Time*: 15 Minutes  De Blanch Kinsinger 03/02/2018  *Care during the described time interval was provided by me and/or other providers on the critical care team.  I have reviewed this patient's available data, including medical history, events of note, physical examination and test results as part of my evaluation.

## 2018-03-02 NOTE — Progress Notes (Signed)
Reason for consult: Status epilepticus  Subjective: Patient alert, but appears confused. Agitated overnight and in withdrawal.    ROS: negative except above  Examination  Vital signs in last 24 hours: Temp:  [98.5 F (36.9 C)-99.9 F (37.7 C)] 99.9 F (37.7 C) (11/17 1200) Pulse Rate:  [100-129] 101 (11/17 1200) Resp:  [17-44] 38 (11/17 1200) BP: (94-129)/(72-110) 103/84 (11/17 1200) SpO2:  [93 %-98 %] 96 % (11/17 1200) Weight:  [47.5 kg] 47.5 kg (11/17 0427)  General: lying in bed CVS: pulse-normal rate and rhythm RS: breathing comfortably Extremities: normal   Neuro: MS: Alert, oriented to self and month. Unaware of reason for hospitalization CN: pupils equal and reactive,  EOMI, face symmetric, tongue midline, normal sensation over face, Motor: 5/5 strength in all 4 extremities Reflexes: 3+ bilaterally over patella, biceps, plantars: flexor Coordination: no gross ataxia noted Gait: not tested  Basic Metabolic Panel: Recent Labs  Lab 02/26/18 1519 02/27/18 0555 02/27/18 1703 02/28/18 0537 03/01/18 0316 03/01/18 0622 03/01/18 1438 03/02/18 0214 03/02/18 0548  NA  --  137  --  139 137 137  --  137  --   K  --  2.6*  --  4.2 4.2 3.7  --  3.2*  --   CL  --  102  --  113* 112* 109  --  109  --   CO2  --  26  --  21* 19* 21*  --  17*  --   GLUCOSE  --  123*  --  131* 114* 138*  --  107*  --   BUN  --  <5*  --  <5* 5* 5*  --  <5*  --   CREATININE  --  0.44  --  0.46 0.46 0.58  --  0.33*  --   CALCIUM  --  7.0*  --  6.8* 7.2* 7.5*  --  6.9*  --   MG 1.1* 1.1* 2.2  --  1.9  --   --   --  1.7  PHOS <1.0* 1.4* 4.3  --  <1.0* <1.0* 2.6 1.9*  --     CBC: Recent Labs  Lab 02/25/18 1859 02/26/18 0303 02/27/18 0555 02/28/18 0537 03/01/18 0622  WBC 14.0* 10.0 9.7 6.1 8.2  HGB 13.0 12.2 12.2 10.6* 11.6*  HCT 38.1 36.6 36.1 32.6* 35.7*  MCV 96.5 95.1 95.8 98.8 98.3  PLT 107* 104* 132* 143* 188     Coagulation Studies: No results for input(s): LABPROT, INR in  the last 72 hours.  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 Agitated and confused likely from withdrawal.  No further clinical seizures.   Traumatic brain injury Partial status epilepticus- resolved Alcohol withdrawal   Plan Continue Keppra and Vimpat Dilantin level was low yesterday, dose adjusted to 125 mg 3 times daily Continue seizure precautions, no driving for atleast 6 months upon discharge MRI brain or CT head when stable ( defer to Neurosurgery) Continue Ativan for withdrawal Continue Thiamine  Will need neurology outpatient follow-up for management of her seizures. Outpatient neuropsych for TBI follow-up  Per [facility] statutes, patients with seizures are not allowed to drive until they have been seizure-free for six months. Use caution when using heavy equipment or power tools. Avoid working on ladders or at heights. Take showers instead of baths. Ensure the water temperature is not too high on the home water heater. Do not go  swimming alone. Do not lock yourself in a room alone (i.e. bathroom). When caring for infants or small children, sit down when holding, feeding, or changing them to minimize risk of injury to the child in the event you have a seizure. Maintain good sleep hygiene. Avoid alcohol.    If Alyssa Little has another seizure, call 911 and bring them back to the ED if:       A.  The seizure lasts longer than 5 minutes.            B.  The patient doesn't wake shortly after the seizure or has new problems such as difficulty seeing, speaking or moving following the seizure       C.  The patient was injured during the seizure       D.  The patient has a temperature over 102 F (39C)       E.  The patient vomited during the seizure and now is having trouble breathing     Georgiana SpinnerSushanth  [facility] Pager Number [phone] For  questions after 7pm please refer to AMION to reach the Neurologist on call

## 2018-03-02 NOTE — Progress Notes (Signed)
Dr. Corliss Skainssuei paged regarding AM labs: Potassium 3.2, Phosphorus 1.9. Per Dr. Corliss Skainssuei, will address electrolyte replacement during AM rounds; no additional orders at this time.

## 2018-03-03 LAB — BASIC METABOLIC PANEL
Anion gap: 10 (ref 5–15)
CO2: 19 mmol/L — AB (ref 22–32)
Calcium: 7.6 mg/dL — ABNORMAL LOW (ref 8.9–10.3)
Chloride: 106 mmol/L (ref 98–111)
Creatinine, Ser: <0.3 mg/dL — ABNORMAL LOW (ref 0.44–1.00)
GLUCOSE: 121 mg/dL — AB (ref 70–99)
Potassium: 3.1 mmol/L — ABNORMAL LOW (ref 3.5–5.1)
Sodium: 135 mmol/L (ref 135–145)

## 2018-03-03 LAB — GLUCOSE, CAPILLARY
GLUCOSE-CAPILLARY: 116 mg/dL — AB (ref 70–99)
Glucose-Capillary: 112 mg/dL — ABNORMAL HIGH (ref 70–99)
Glucose-Capillary: 130 mg/dL — ABNORMAL HIGH (ref 70–99)
Glucose-Capillary: 160 mg/dL — ABNORMAL HIGH (ref 70–99)
Glucose-Capillary: 97 mg/dL (ref 70–99)
Glucose-Capillary: 97 mg/dL (ref 70–99)

## 2018-03-03 LAB — PHOSPHORUS: Phosphorus: 2.9 mg/dL (ref 2.5–4.6)

## 2018-03-03 LAB — MAGNESIUM: Magnesium: 2.1 mg/dL (ref 1.7–2.4)

## 2018-03-03 MED ORDER — ENSURE ENLIVE PO LIQD
237.0000 mL | Freq: Two times a day (BID) | ORAL | Status: DC
  Administered 2018-03-03 – 2018-03-05 (×4): 237 mL via ORAL

## 2018-03-03 MED ORDER — PHENYTOIN 125 MG/5ML PO SUSP
125.0000 mg | Freq: Three times a day (TID) | ORAL | Status: DC
  Administered 2018-03-03 – 2018-03-05 (×5): 125 mg via ORAL
  Filled 2018-03-03 (×2): qty 5
  Filled 2018-03-03 (×3): qty 8
  Filled 2018-03-03 (×5): qty 5

## 2018-03-03 MED ORDER — ADULT MULTIVITAMIN W/MINERALS CH
1.0000 | ORAL_TABLET | Freq: Every day | ORAL | Status: DC
  Administered 2018-03-04 – 2018-03-05 (×2): 1 via ORAL
  Filled 2018-03-03 (×2): qty 1

## 2018-03-03 MED ORDER — POTASSIUM CHLORIDE 10 MEQ/100ML IV SOLN
10.0000 meq | INTRAVENOUS | Status: AC
  Administered 2018-03-03 (×5): 10 meq via INTRAVENOUS
  Filled 2018-03-03 (×5): qty 100

## 2018-03-03 NOTE — Progress Notes (Signed)
Trauma Service Note  Subjective: Patient is doing better than before.  Responsive.  Now off Precedex.  Will DC  Objective: Vital signs in last 24 hours: Temp:  [98.4 F (36.9 C)-100.1 F (37.8 C)] 100.1 F (37.8 C) (11/18 0800) Pulse Rate:  [99-123] 99 (11/18 0900) Resp:  [20-42] 38 (11/18 0900) BP: (99-136)/(56-114) 102/83 (11/18 0900) SpO2:  [91 %-100 %] 91 % (11/18 0900) Last BM Date: 03/03/18  Intake/Output from previous day: 11/17 0701 - 11/18 0700 In: 1317.5 [I.V.:274.4; NG/GT:585; IV Piggyback:458.1] Out: 1800 [Urine:1800] Intake/Output this shift: No intake/output data recorded.  General: No acute distress.  Sleeipy  Lungs: Clear  Abd: Benign  Extremities: No changes  Neuro: Seems more oriented and on CIWA protocol  Lab Results: CBC  Recent Labs    03/01/18 0622  WBC 8.2  HGB 11.6*  HCT 35.7*  PLT 188   BMET Recent Labs    03/02/18 0214 03/03/18 0420  NA 137 135  K 3.2* 3.1*  CL 109 106  CO2 17* 19*  GLUCOSE 107* 121*  BUN <5* <5*  CREATININE 0.33* <0.30*  CALCIUM 6.9* 7.6*   PT/INR No results for input(s): LABPROT, INR in the last 72 hours. ABG No results for input(s): PHART, HCO3 in the last 72 hours.  Invalid input(s): PCO2, PO2  Studies/Results: Dg Cerv Spine Flex&ext Only  Result Date: 03/01/2018 CLINICAL DATA:  Subarachnoid hemorrhage. EXAM: CERVICAL SPINE - FLEXION AND EXTENSION VIEWS ONLY COMPARISON:  CT scan from 02/24/2018. FINDINGS: Neutral, flexion, and extension views of the cervical spine show no evidence of fracture. No subluxation. Intervertebral disc spaces are preserved. No evidence of abnormal motion. NG tube overlies the soft tissues of the neck. IMPRESSION: No abnormal motion with flexion and extension. Electronically Signed   By: Kennith CenterEric  Mansell M.D.   On: 03/01/2018 17:01    Anti-infectives: Anti-infectives (From admission, onward)   Start     Dose/Rate Route Frequency Ordered Stop   02/26/18 1200   piperacillin-tazobactam (ZOSYN) IVPB 3.375 g     3.375 g 12.5 mL/hr over 240 Minutes Intravenous Every 8 hours 02/26/18 1001 03/03/18 2359      Assessment/Plan: s/p  Discontinue Prededex  Replace KCL Continue therapies. Repeat swallowing evaluation.  LOS: 7 days   Marta LamasJames O. Gae BonWyatt, III, MD, FACS [phone] Trauma Surgeon 03/03/2018

## 2018-03-03 NOTE — Progress Notes (Signed)
Physical Therapy Treatment Patient Details Name: Alyssa LacyChristina Danielle Donnell MRN: [id number] DOB: 10/12/1980 Today's Date: 03/03/2018    History of Present Illness 37 yo admitted found down on break room at work.  Dx with TBI/SAH/B F ICC/IVH, seizure (likely from above) countinuous EEG finished 11/14 results pending.  MRI pending, complex scalp lac, ABL anemia, Tachycardia, alcohol abuse disorder (CIWA/precedex), hypokalemia, hypophosphatemia, hypo mag, hypocalcemia.  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  Intubated 11/11-11/12. PMhx: ETOH abuse, other not known    PT Comments    Pt progressing towards all goals. Demonstrating improved sustained attention to task and ability to follow commands. Pt did tolerate ambulation this date with maxAx1 for sequencing and facilitating stepping pattern on L LE. Pt demonstrates behavior consistent with Rancho level V, emerging VI (confused/appropriate). Continue to recommend CIR for rehab. Discussed recommendations with pt's significant other ([name]) and nsg. Will continue to follow acutely.    Follow Up Recommendations  CIR     Equipment Recommendations  None recommended by PT    Recommendations for Other Services Rehab consult     Precautions / Restrictions Precautions Precautions: Fall Precaution Booklet Issued: No Precaution Comments: cortrak Required Braces or Orthoses: Cervical Brace Cervical Brace: Other (comment);Hard collar(c-spine not cleared yet) Restrictions Weight Bearing Restrictions: No    Mobility  Bed Mobility Overal bed mobility: Needs Assistance Bed Mobility: Supine to Sit Rolling: Mod assist Sidelying to sit: Mod assist;HOB elevated Supine to sit: Mod assist     General bed mobility comments: cues for initiation ; tactile cues to transition forward toward EOB  Transfers Overall transfer level: Needs assistance Equipment used: 2 person hand held assist Transfers: Sit to/from Stand Sit  to Stand: Min assist;+2 safety/equipment Stand pivot transfers: Mod assist       General transfer comment: LOB noted posteirorly and toward left when balance challenged; Difficulty with backward stepping  Ambulation/Gait Ambulation/Gait assistance: Mod assist;+2 safety/equipment Gait Distance (Feet): 120 Feet Assistive device: 2 person hand held assist Gait Pattern/deviations: Step-through pattern;Decreased stride length;Decreased stance time - left;Decreased step length - left Gait velocity: slow Gait velocity interpretation: <1.31 ft/sec, indicative of household ambulator General Gait Details: pt initially with decreased L step height and length with noted impaired sequencing. PT provided tactile cues at bilat hips to help facilitate posterior pelvic tilt to promote trunk extension as well as facilitate weight shifting L and R. Pt able to maintian with decreased assist for about 25 feet, Pt requires tactile cues once pt stops ambulating and needs to start again   Stairs             Wheelchair Mobility    Modified Rankin (Stroke Patients Only) Modified Rankin (Stroke Patients Only) Pre-Morbid Rankin Score: No symptoms Modified Rankin: Moderately severe disability     Balance Overall balance assessment: Needs assistance Sitting-balance support: Feet supported;Bilateral upper extremity supported Sitting balance-Leahy Scale: Fair Sitting balance - Comments: started as mod assist in sitting EOB, but progressed to min guard and close supervision.    Standing balance support: No upper extremity supported Standing balance-Leahy Scale: Poor Standing balance comment: reliant on external suport; posterior bias                            Cognition Arousal/Alertness: Awake/alert Behavior During Therapy: Flat affect;Restless Overall Cognitive Status: Impaired/Different from baseline Area of Impairment: Orientation;Attention;Memory;Following  commands;Safety/judgement;Awareness;Problem solving  Rancho Levels of Cognitive Functioning Rancho Los Amigos Scales of Cognitive Functioning: Confused/appropriate Orientation Level: Disoriented to;Time Current Attention Level: Sustained Memory: Decreased recall of precautions;Decreased short-term memory Following Commands: Follows one step commands consistently Safety/Judgement: Decreased awareness of safety;Decreased awareness of deficits Awareness: Emergent Problem Solving: Slow processing;Difficulty sequencing;Requires verbal cues General Comments: Precedex turned down but not off; ablet o sutain attention to task for short periods of time; Able to sustain attneiton to brush teeth; pt appeasr to perseverate at times; Difficulty maintaining arousal throughout session which is most likely affected by medication in addition to TBI.  Eyes open ~25% of time, seems to be light sensative, expressing some basic needs, recognized her grandmother      Exercises      General Comments General comments (skin integrity, edema, etc.): Resting HR is 105, increased into 120s, RR in 30s      Pertinent Vitals/Pain Pain Assessment: Faces Faces Pain Scale: Hurts little more Pain Location: nose Pain Descriptors / Indicators: Discomfort;Grimacing Pain Intervention(s): Limited activity within patient's tolerance    Home Living                      Prior Function            PT Goals (current goals can now be found in the care plan section) Acute Rehab PT Goals Patient Stated Goal: to "get the tube out of her nose" PT Goal Formulation: Patient unable to participate in goal setting Time For Goal Achievement: 03/13/18 Potential to Achieve Goals: Good Progress towards PT goals: Progressing toward goals    Frequency    Min 3X/week      PT Plan Current plan remains appropriate    Co-evaluation PT/OT/SLP Co-Evaluation/Treatment: Yes Reason for Co-Treatment:  Complexity of the patient's impairments (multi-system involvement) PT goals addressed during session: Mobility/safety with mobility OT goals addressed during session: ADL's and self-care      AM-PAC PT "6 Clicks" Daily Activity  Outcome Measure  Difficulty turning over in bed (including adjusting bedclothes, sheets and blankets)?: A Little Difficulty moving from lying on back to sitting on the side of the bed? : Unable Difficulty sitting down on and standing up from a chair with arms (e.g., wheelchair, bedside commode, etc,.)?: Unable Help needed moving to and from a bed to chair (including a wheelchair)?: A Lot Help needed walking in hospital room?: A Lot Help needed climbing 3-5 steps with a railing? : Total 6 Click Score: 10    End of Session Equipment Utilized During Treatment: Gait belt Activity Tolerance: Patient limited by lethargy;Patient limited by pain;Patient tolerated treatment well Patient left: in chair;with call bell/phone within reach;with chair alarm set;Other (comment);with family/visitor present(posey belt alarm) Nurse Communication: Mobility status PT Visit Diagnosis: Muscle weakness (generalized) (M62.81);Difficulty in walking, not elsewhere classified (R26.2);Pain;Other symptoms and signs involving the nervous system (R29.898) Pain - Right/Left: (head) Pain - part of body: (head)     Time: 1610-9604 PT Time Calculation (min) (ACUTE ONLY): 30 min  Charges:  $Gait Training: 8-22 mins                     Lewis Shock, PT, DPT Acute Rehabilitation Services Pager #: [phone] Office #: [phone]    Iona Hansen 03/03/2018, 12:54 PM

## 2018-03-03 NOTE — Progress Notes (Signed)
Neurology paged to assist in getting dilantin order.

## 2018-03-03 NOTE — Progress Notes (Signed)
Have contacted pharmacy by text and by phone call, attempting to get phenotoin changed from Per Tube to PO. They are dosing.

## 2018-03-03 NOTE — Progress Notes (Signed)
Occupational Therapy Treatment Patient Details Name: Alyssa Little MRN: [id number] DOB: Oct 26, 1980 Today's Date: 03/03/2018    History of present illness 37 yo admitted found down on break room at work.  Dx with TBI/SAH/B F ICC/IVH, seizure (likely from above) countinuous EEG finished 11/14 results pending.  MRI pending, complex scalp lac, ABL anemia, Tachycardia, alcohol abuse disorder (CIWA/precedex), hypokalemia, hypophosphatemia, hypo mag, hypocalcemia.  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  Intubated 11/11-11/12. PMhx: ETOH abuse, other not known   OT comments  Pt making progress. Goals updated. Demonstrating improved sustained attention to task, increasing performance with ADL and functional mobility with Mod A at times. Apparent cognitive deficits in addition to other deficits as noted below. Pt demonstrates behavior consistent with Rancho level V, emerging VI (confused/appropriate). Continue to recommend CIR for rehab. Discussed recommendations with pt's significant other ([name]) and nsg. Will continue to follow acutely.   Follow Up Recommendations  CIR;Supervision/Assistance - 24 hour    Equipment Recommendations  3 in 1 bedside commode    Recommendations for Other Services Rehab consult    Precautions / Restrictions Precautions Precautions: Fall       Mobility Bed Mobility Overal bed mobility: Needs Assistance Bed Mobility: Supine to Sit     Supine to sit: Mod assist     General bed mobility comments: cues for initiation ; tactile cues to transition forward toward EOB  Transfers Overall transfer level: Needs assistance Equipment used: 2 person hand held assist Transfers: Sit to/from Stand Sit to Stand: Min assist;+2 safety/equipment Stand pivot transfers: Mod assist       General transfer comment: LOB noted posteriorly and toward left when balance challenged; Difficulty with backward stepping    Balance Overall  balance assessment: Needs assistance   Sitting balance-Leahy Scale: Fair       Standing balance-Leahy Scale: Poor Standing balance comment: reliant on external suport; posterior bias                           ADL either performed or assessed with clinical judgement   ADL Overall ADL's : Needs assistance/impaired Eating/Feeding: Moderate assistance Eating/Feeding Details (indicate cue type and reason): liquids only; coretrack Grooming: Moderate assistance;Standing;Cueing for safety;Cueing for sequencing Grooming Details (indicate cue type and reason): note undershooting when reaching for water; cues for sequencing (using soap); cues for thouroughness of activity                             Functional mobility during ADLs: Moderate assistance;+2 for safety/equipment General ADL Comments: Increased attention to task     Vision   Vision Assessment?: Vision impaired- to be further tested in functional context;Yes Ocular Range of Motion: Within Functional Limits Tracking/Visual Pursuits: Decreased smoothness of horizontal tracking;Decreased smoothness of vertical tracking Saccades: Additional head turns occurred during testing Depth Perception: Overshoots;Undershoots Additional Comments: Will further assess   Perception     Praxis      Cognition Arousal/Alertness: Awake/alert Behavior During Therapy: Flat affect;Restless Overall Cognitive Status: Impaired/Different from baseline Area of Impairment: Orientation;Attention;Memory;Following commands;Safety/judgement;Awareness;Problem solving               Rancho Levels of Cognitive Functioning Rancho Los Amigos Scales of Cognitive Functioning: Confused/appropriate Orientation Level: Disoriented to;Time Current Attention Level: Sustained Memory: Decreased recall of precautions;Decreased short-term memory Following Commands: Follows one step commands consistently Safety/Judgement: Decreased awareness  of safety;Decreased awareness  of deficits Awareness: Emergent Problem Solving: Slow processing;Difficulty sequencing;Requires verbal cues          Exercises     Shoulder Instructions       General Comments      Pertinent Vitals/ Pain       Pain Assessment: Faces Faces Pain Scale: Hurts little more Pain Location: nose Pain Descriptors / Indicators: Discomfort;Grimacing Pain Intervention(s): Limited activity within patient's tolerance  Home Living                                          Prior Functioning/Environment              Frequency  Min 3X/week        Progress Toward Goals  OT Goals(current goals can now be found in the care plan section)  Progress towards OT goals: Goals met and updated - see care plan  Acute Rehab OT Goals Patient Stated Goal: to "get the tube out of her nose" OT Goal Formulation: With patient/family Time For Goal Achievement: 03/14/18 Potential to Achieve Goals: Good ADL Goals Pt Will Perform Grooming: with supervision;standing Pt Will Perform Upper Body Bathing: with supervision;sitting Pt Will Perform Lower Body Bathing: with supervision;sit to/from stand Pt Will Transfer to Toilet: with supervision;ambulating Additional ADL Goal #1: Pt will demonstrate antifcipatory awareness with min vc in non-distracting environment  Plan Discharge plan remains appropriate    Co-evaluation    PT/OT/SLP Co-Evaluation/Treatment: Yes Reason for Co-Treatment: Complexity of the patient's impairments (multi-system involvement);Necessary to address cognition/behavior during functional activity;To address functional/ADL transfers   OT goals addressed during session: ADL's and self-care      AM-PAC PT "6 Clicks" Daily Activity     Outcome Measure   Help from another person eating meals?: A Little Help from another person taking care of personal grooming?: A Lot Help from another person toileting, which includes using  toliet, bedpan, or urinal?: A Lot Help from another person bathing (including washing, rinsing, drying)?: A Lot Help from another person to put on and taking off regular upper body clothing?: A Lot Help from another person to put on and taking off regular lower body clothing?: A Lot 6 Click Score: 13    End of Session Equipment Utilized During Treatment: Gait belt  OT Visit Diagnosis: Other abnormalities of gait and mobility (R26.89);Muscle weakness (generalized) (M62.81);Other symptoms and signs involving cognitive function   Activity Tolerance Patient tolerated treatment well   Patient Left in chair;with call bell/phone within reach;with family/visitor present;Other (comment)(chair alarm belt)   Nurse Communication Mobility status        Time: [phone] OT Time Calculation (min): 28 min  Charges: OT General Charges $OT Visit: 1 Visit OT Treatments $Self Care/Home Management : 8-22 mins  Maurie Boettcher, OT/L   Acute OT Clinical Specialist [place] Pager [phone] Office [phone]    [facility] 03/03/2018, 12:19 PM

## 2018-03-03 NOTE — Progress Notes (Signed)
Nutrition Follow-up  DOCUMENTATION CODES:   Non-severe (moderate) malnutrition in context of chronic illness  INTERVENTION:   D/C Pivot  Ensure Enlive po BID, each supplement provides 350 kcal and 20 grams of protein  Magic cup TID with meals, each supplement provides 290 kcal and 9 grams of protein  Change MVI from liquid to tablet  NUTRITION DIAGNOSIS:   Moderate Malnutrition related to chronic illness(possible GI illness/ETOH abuse) as evidenced by mild fat depletion, moderate muscle depletion. Ongoing  GOAL:   Patient will meet greater than or equal to 90% of their needs Progressing  MONITOR:   PO intake, Supplement acceptance  ASSESSMENT:   Pt with PMH of ETOH abuse (drinks 2 cups of brandy daily) now admitted s/p fall with TBI, SAH, B ICC, IVH, complex scalp lac s/p closure 11/11, and seizures.    Pt discussed during ICU rounds and with RN.   Pt passed swallow eval and was advanced to Regular diet today. Per RN Cortrak removed today.  Therapy recommends CIR.   Pt confused, asking when someone will be picking her up.  Pt states that she would be willing to try ensure now that diet is advanced.   Medications reviewed and include: folic acid, MVI, thiamine, Kphos 10 mEq KCl every hr x 5 Labs reviewed: K+ 3.1 (L)   UOP: 1800 ml x 24 hrs   Diet Order:   Diet Order            Diet regular Room service appropriate? Yes; Fluid consistency: Thin  Diet effective now              EDUCATION NEEDS:   No education needs have been identified at this time  Skin:  Skin Assessment: Reviewed RN Assessment  Last BM:  11/18 small  Height:   Ht Readings from Last 1 Encounters:  02/24/18 5\' 1"  (1.549 m)    Weight:   Wt Readings from Last 1 Encounters:  03/02/18 47.5 kg    Ideal Body Weight:  47.7 kg  BMI:  Body mass index is 19.79 kg/m.  Estimated Nutritional Needs:   Kcal:  1550-1800  Protein:  77-92 grams  Fluid:  > 1.5 L/day  Kendell BaneHeather Jaton Eilers  RD, LDN, CNSC [phone] Pager [phone] After Hours Pager

## 2018-03-03 NOTE — Progress Notes (Signed)
No answer from Neurology, pharmacy called again regarding dilantin. They stated they will change order to PO and send dose now.

## 2018-03-03 NOTE — Progress Notes (Signed)
  Speech Language Pathology Treatment: Dysphagia;Cognitive-Linquistic  Patient Details Name: Alyssa LacyChristina Danielle Little MRN: [id number] DOB: 10/07/1980 Today's Date: 03/03/2018 Time: [id number] SLP Time Calculation (min) (ACUTE ONLY): 20 min  Assessment / Plan / Recommendation Clinical Impression  Pt demonstrated cognitive improvements today with improved sustained attention to task, consistently followed commands, calm and communicative, and less distracted by internal/external environmental stimuli.  Increased spontaneity of communication reveals confabulatory behaviors, describing interactions with staff that are a blend of fact and fiction.  Pt with severely impaired short-term recall (verbal and visual).  Poor generative naming (phonemic and semantic) with subcategory cues required to produce more than three items. Oriented to person, time and place when required to recognize from choice of three.   Pt's improved mental status allows diet to be advanced today.  She demonstrated adequate and consistent attention to liquids and solids with functional mastication, the appearance of a brisk swallow, and no overt s/s of mastication.  She may require assist with self-feeding and tray set-up.  Recommend advancing diet to regular solids, thin liquids; D/C cortrak.  D/W pt's boyfriend, Alyssa Little, and Charity fundraiserN, Consuella Loselaine.  Pt is a solid RL V with some emerging VI behaviors.    HPI HPI:  89 yof admitted from work at [employer] with unwitnessed falls with seizures and extensive traumatic SAH. Intubated 11/11-11/12.  CT 11/12: Interval decrease in moderate volume SAH as compared to previous exam. Increased intraventricular blood consistent with redistribution.  Evolving small bifrontal hemorrhagic contusions as above, right slightly larger than left. No significant mass effect.      SLP Plan  Continue with current plan of care       Recommendations  Diet recommendations: Regular;Thin liquid Liquids provided via:  Straw;Cup Medication Administration: Whole meds with liquid Supervision: Patient able to self feed(assist with tray set-up; may need assist with self-feeding) Compensations: Minimize environmental distractions                Oral Care Recommendations: Oral care BID Follow up Recommendations: Inpatient Rehab Plan: Continue with current plan of care       GO             Alyssa Little L. Samson Fredericouture, MA CCC/SLP Acute Rehabilitation Services Office number [phone] Pager [phone]    Blenda MountsCouture, Kelsen Celona Laurice 03/03/2018, 11:00 AM

## 2018-03-03 NOTE — Progress Notes (Signed)
OT Treatment Note  Alarm activated. Pt seen for additional session to ambulate to bathroom to void. Pt demonstrating increased awareness by asking to use the bathroom and being embarrassed due to being incontinent. Nsg educated on use of chair alarm belt. [name]/caregiver educated on need to call for assistance before releasing/removing  Belt - verbalized understanding. [facility]     03/03/18 1300  OT Visit Information  Last OT Received On 03/03/18  Assistance Needed +2 (for line management)  History of Present Illness 37 yo admitted found down on break room at work.  Dx with TBI/SAH/B F ICC/IVH, seizure (likely from above) countinuous EEG finished 11/14 results pending.  MRI pending, complex scalp lac, ABL anemia, Tachycardia, alcohol abuse disorder (CIWA/precedex), hypokalemia, hypophosphatemia, hypo mag, hypocalcemia.  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  Intubated 11/11-11/12. PMhx: ETOH abuse, other not known  Precautions  Precautions Fall  Pain Assessment  Pain Assessment Faces  Faces Pain Scale 4  Pain Location nose  Pain Descriptors / Indicators Discomfort;Grimacing  Pain Intervention(s) Limited activity within patient's tolerance  Cognition  Arousal/Alertness Awake/alert  Behavior During Therapy Flat affect;Restless  Overall Cognitive Status Impaired/Different from baseline  Area of Impairment Orientation;Attention;Memory;Following commands;Safety/judgement;Awareness;Problem solving  Orientation Level Disoriented to;Time  Current Attention Level Sustained  Memory Decreased recall of precautions;Decreased short-term memory  Following Commands Follows one step commands consistently  Safety/Judgement Decreased awareness of safety;Decreased awareness of deficits  Awareness Emergent  Problem Solving Slow processing;Difficulty sequencing;Requires verbal cues  General Comments Pt oriented to [place] adn stated she  was in the hospital; difficulty wtih time (December); pt unableto look at calendar in R field to determine and problem solve regarding date  ADL  Lower Body Dressing Moderate assistance  Lower Body Dressing Details (indicate cue type and reason) use of backward chaining to donn socks; difficulty with coordination noted; pulling up sock as if on foot but not over toes  Toilet Transfer Moderate assistance  Toilet Transfer Details (indicate cue type and reason) Ambulated to toilet; Difficulty with turning and stepping back to toilet  Toileting- Clothing Manipulation and Hygiene Moderate assistance  Functional mobility during ADLs Moderate assistance  General ADL Comments Cues for sequencing when washing hands  Balance  Sitting balance-Leahy Scale Fair  Standing balance-Leahy Scale Poor  Vision- Assessment  Vision Assessment? Vision impaired- to be further tested in functional context  Rancho Levels of Cognitive Functioning  Rancho MirantLos Amigos Scales of Cognitive Functioning VI (emerging VI)  Transfers  Overall transfer level Needs assistance  Transfers Sit to/from Stand  Sit to Stand Min assist  Stand pivot transfers Mod assist  OT - End of Session  Equipment Utilized During Treatment Gait belt  Activity Tolerance Patient tolerated treatment well  Patient left in chair;with call bell/phone within reach;Other (comment);with nursing/sitter in room;with family/visitor present (nsg educated on alarm belt)  Nurse Communication Mobility status  OT Assessment/Plan  OT Plan Discharge plan remains appropriate  OT Visit Diagnosis Other abnormalities of gait and mobility (R26.89);Muscle weakness (generalized) (M62.81);Other symptoms and signs involving cognitive function  OT Frequency (ACUTE ONLY) Min 3X/week  Recommendations for Other Services Rehab consult  Follow Up Recommendations CIR;Supervision/Assistance - 24 hour  OT Equipment 3 in 1 bedside commode  AM-PAC OT "6 Clicks" Daily Activity  Outcome Measure  Help from another person eating meals? 3  Help from another person taking care of personal grooming? 2  Help from another person toileting, which includes  using toliet, bedpan, or urinal? 2  Help from another person bathing (including washing, rinsing, drying)? 2  Help from another person to put on and taking off regular upper body clothing? 2  Help from another person to put on and taking off regular lower body clothing? 2  6 Click Score 13  ADL G Code Conversion CL  OT Goal Progression  Progress towards OT goals Progressing toward goals  Acute Rehab OT Goals  Patient Stated Goal to "get the tube out of her nose"  OT Goal Formulation With patient/family  Time For Goal Achievement 03/14/18  Potential to Achieve Goals Good  ADL Goals  Pt Will Perform Grooming with supervision;standing  Pt Will Perform Upper Body Bathing with supervision;sitting  Additional ADL Goal #1 Pt will demonstrate antifcipatory awareness with min vc in non-distracting environment  Pt Will Perform Lower Body Bathing with supervision;sit to/from stand  Pt Will Transfer to Toilet with supervision;ambulating  OT Time Calculation  OT Start Time (ACUTE ONLY) 1049  OT Stop Time (ACUTE ONLY) 1109  OT Time Calculation (min) 20 min  OT General Charges  $OT Visit 1 Visit  OT Treatments  $Self Care/Home Management  8-22 mins  Luisa Dago, OT/L   Acute OT Clinical Specialist Acute Rehabilitation Services Pager [phone] Office [phone]

## 2018-03-04 LAB — GLUCOSE, CAPILLARY
GLUCOSE-CAPILLARY: 100 mg/dL — AB (ref 70–99)
GLUCOSE-CAPILLARY: 102 mg/dL — AB (ref 70–99)
GLUCOSE-CAPILLARY: 92 mg/dL (ref 70–99)
Glucose-Capillary: 79 mg/dL (ref 70–99)
Glucose-Capillary: 85 mg/dL (ref 70–99)
Glucose-Capillary: 130 mg/dL — ABNORMAL HIGH (ref 70–99)

## 2018-03-04 LAB — BASIC METABOLIC PANEL
Anion gap: 10 (ref 5–15)
CO2: 21 mmol/L — AB (ref 22–32)
Calcium: 8.9 mg/dL (ref 8.9–10.3)
Chloride: 105 mmol/L (ref 98–111)
Creatinine, Ser: 0.42 mg/dL — ABNORMAL LOW (ref 0.44–1.00)
GFR calc Af Amer: >60 mL/min (ref >60)
GLUCOSE: 94 mg/dL (ref 70–99)
POTASSIUM: 3.4 mmol/L — AB (ref 3.5–5.1)
Sodium: 136 mmol/L (ref 135–145)

## 2018-03-04 MED ORDER — LACOSAMIDE 200 MG PO TABS
200.0000 mg | ORAL_TABLET | Freq: Two times a day (BID) | ORAL | Status: DC
  Administered 2018-03-04 – 2018-03-05 (×3): 200 mg via ORAL
  Filled 2018-03-04 (×3): qty 1

## 2018-03-04 MED ORDER — LEVETIRACETAM 750 MG PO TABS
1500.0000 mg | ORAL_TABLET | Freq: Two times a day (BID) | ORAL | Status: DC
  Administered 2018-03-04 – 2018-03-05 (×2): 1500 mg via ORAL
  Filled 2018-03-04 (×2): qty 2

## 2018-03-04 MED ORDER — POTASSIUM CHLORIDE CRYS ER 20 MEQ PO TBCR
20.0000 meq | EXTENDED_RELEASE_TABLET | Freq: Once | ORAL | Status: AC
  Administered 2018-03-04: 20 meq via ORAL
  Filled 2018-03-04: qty 1

## 2018-03-04 NOTE — PMR Pre-admission (Signed)
PMR Admission Coordinator Pre-Admission Assessment  Patient: Alyssa Little is an 37 y.o., female MRN: [id number] DOB: [birth year] Height: _0  (154.9 cm) Weight: 47 kg              Insurance Information HMO:     PPO: Yes     PCP:      IPA:      80/20:      OTHER:  PRIMARY: [employer]      Policy#: [id number]      Subscriber: Patient CM Name: Oscar La       Phone#: [phone]     Fax#: [phone] Pre-Cert#: [id number]      Employer:  Josem Kaufmann provided by Luisa Hart at [facility] for admit to CIR; Pt is approved for 11/20-11/26.  Benefits:  Phone #: [phone]     Name: automated phone line + availity  Eff. Date: 04/16/17     Deduct: $2,250 (met $0)      Out of Pocket Max: $4,850 (met $0)      Life Max: NA CIR: 70%/30%      SNF: 70%/30% with 180 days/cal year Outpatient:  70%; 180 days comb all therapies    Co-Pay: 30% Home Health: 70%; per necssity      Co-Pay: 30% DME: 70%     Co-Pay: 30% Providers:  SECONDARY:       Policy#:       Subscriber:  CM Name:       Phone#:      Fax#:  Pre-Cert#:       Employer:  Benefits:  Phone #:      Name:  Eff. Date:      Deduct:       Out of Pocket Max:       Life Max:  CIR:      SNF:  Outpatient:      Co-Pay:  Home Health:       Co-Pay:  DME:      Co-Pay:   Medicaid Application Date:       Case Manager:  Disability Application Date:       Case Worker:   Emergency Facilities manager Information    Name Relation Home Work Mobile   one, no    [phone]     Current Medical History  Patient Admitting Diagnosis: Seizures with TBI History of Present Illness: Alyssa Little is a 36 year old right-handed female with unremarkable past medical history except for reported alcohol use. Patient lives alone. She has a boyfriend. One level home one step to entry. Independent prior to admission working full time. Boyfriend is available except works from 2 PM to 9:30 PM.Her aunt and grandparents are planning to assist  as needed. Presented 02/24/2018 after being found down in a break room at work convulsing with bleeding from scalp. UDS negative and alcohol [id number]. Cranial CT scan showed small to moderate volume traumatic subarachnoid hemorrhage along both sylvian fissures and the cerebral convexities, more so on the right. Large right anterior convexity scalp hematoma but no underlying skull fracture identified. CT cervical spine and maxillofacial negative for fracture. Patient did require intubation. Continuous EEG showed showing no definite seizure. Mild to moderate diffuse encephalopathy.. Neurosurgery Dr. JArnoldo Moralefollow-up with initial plan for ventriculostomy outpatient neurological status improved and ventriculostomy held. Latest cranial CT scan showed interval decrease in subarachnoid hemorrhage as compared to previous imaging with no hydrocephalus. Patient is  Currently maintained on Vimpat, Keppra and Dilantin. Tolerating a regular diet. Therapy  evaluations completed with recommendations of physical medicine rehabilitation consult. Patient is to be admitted for a comprehensive rehabilitation program on 03/05/18.      Past Medical History  No past medical history on file.  Family History  family history is not on file.  Prior Rehab/Hospitalizations:  Has the patient had major surgery during 100 days prior to admission? No  Current Medications   Current Facility-Administered Medications:  .  0.9 % NaCl with KCl 20 mEq/ L  infusion, , Intravenous, Continuous, Kinsinger, Arta Bruce, MD, Stopped at 03/03/18 1632 .  acetaminophen (TYLENOL) suppository 650 mg, 650 mg, Rectal, Q4H PRN, Greta Doom, MD, 650 mg at 03/02/18 1644 .  chlorhexidine (PERIDEX) 0.12 % solution 15 mL, 15 mL, Mouth Rinse, BID, Judeth Horn, MD, 15 mL at 03/05/18 0956 .  feeding supplement (ENSURE ENLIVE) (ENSURE ENLIVE) liquid 237 mL, 237 mL, Oral, BID BM, Judeth Horn, MD, 237 mL at 03/05/18 0953 .  folic acid (FOLVITE)  tablet 1 mg, 1 mg, Per Tube, Daily, Skeet Simmer, RPH, 1 mg at 03/05/18 9373 .  Influenza vac split quadrivalent PF (FLUARIX) injection 0.5 mL, 0.5 mL, Intramuscular, Tomorrow-1000, Saverio Danker, PA-C .  lacosamide (VIMPAT) tablet 200 mg, 200 mg, Oral, BID, Georganna Skeans, MD, 200 mg at 03/05/18 0954 .  levETIRAcetam (KEPPRA) tablet 1,500 mg, 1,500 mg, Oral, BID, Georganna Skeans, MD, 1,500 mg at 03/05/18 0954 .  LORazepam (ATIVAN) tablet 1 mg, 1 mg, Oral, Q6H PRN, 1 mg at 03/05/18 0531 **OR** LORazepam (ATIVAN) injection 1 mg, 1 mg, Intravenous, Q3H PRN, Donnie Mesa, MD, 1 mg at 03/04/18 2321 .  MEDLINE mouth rinse, 15 mL, Mouth Rinse, q12n4p, Judeth Horn, MD, 15 mL at 03/04/18 1635 .  metoprolol tartrate (LOPRESSOR) injection 2.5 mg, 2.5 mg, Intravenous, Q6H PRN, Georganna Skeans, MD, 2.5 mg at 02/26/18 2059 .  midazolam (VERSED) injection 2-4 mg, 2-4 mg, Intravenous, Q1H PRN, Judeth Horn, MD, 2 mg at 03/01/18 0610 .  multivitamin with minerals tablet 1 tablet, 1 tablet, Oral, Daily, Judeth Horn, MD, 1 tablet at 03/05/18 0954 .  phenytoin (DILANTIN) 125 MG/5ML suspension 150 mg, 150 mg, Oral, TID, Judeth Horn, MD .  pneumococcal 23 valent vaccine (PNU-IMMUNE) injection 0.5 mL, 0.5 mL, Intramuscular, Tomorrow-1000, Saverio Danker, PA-C .  thiamine (VITAMIN B-1) tablet 100 mg, 100 mg, Per Tube, Daily, Skeet Simmer, RPH, 100 mg at 03/05/18 4287  Patients Current Diet:  Diet Order            Diet regular Room service appropriate? 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  Independent  Indoor Mobility: Did the patient need assistance with walking from room to room (with or without device)?  Independent  Stairs: Did the patient need assistance with internal or external stairs (with or without device)? Independent  Functional Cognition: Did the patient need help planning regular tasks such as shopping or remembering to take medications? Independent  Current Functional Level Cognition  Arousal/Alertness: Awake/alert Overall Cognitive Status: Impaired/Different from baseline Current Attention Level: Sustained Orientation Level: Oriented to person, Disoriented to place, Disoriented to time, Disoriented to situation Following Commands: Follows one step commands consistently Safety/Judgement: Decreased awareness of safety, Decreased awareness of deficits General Comments: pt now reports "I had a seizure and fell at work", pt cont to have memory deficits but is improving in problem solving, with verbal cues pt able to navigate back to room despite not remembering room number Attention: Focused Focused Attention: Impaired Focused Attention Impairment: Verbal basic Memory: Impaired Memory Impairment: Storage deficit Awareness: Impaired Awareness Impairment: Intellectual impairment Behaviors: Restless, Physical agitation, Poor frustration tolerance Safety/Judgment: Impaired [employer] Scales of Cognitive Functioning: Confused/appropriate    Extremity Assessment (includes Sensation/Coordination)  Upper Extremity Assessment: Generalized  weakness, RUE deficits/detail, LUE deficits/detail RUE Deficits / Details: Using R UE funcitonally during ADL; noted difficulty with fine motor control and coordination;  LUE Deficits / Details: using LUE spontaneously; doifficulty with coordination  Lower Extremity Assessment: Defer to PT evaluation RLE Deficits / Details: Pt actively moves both LEs, just not to command right now.  ROM good, took some light weight on legs in standing, but did not kick in extension or WB as well as I anticiapted (she was also sedated).  LLE Deficits / Details: essentially the same as her left leg.      ADLs  Overall ADL's : Needs assistance/impaired Eating/Feeding: Moderate assistance Eating/Feeding Details (indicate cue type and reason): liquids only; coretrack Grooming: Moderate assistance, Standing, Cueing for safety, Cueing for sequencing Grooming Details (indicate cue type and reason): note undershooting when reaching for water; cues for sequencing (using soap); cues for thouroughness of activity Lower Body Dressing: Moderate assistance Lower Body Dressing Details (indicate cue type and reason): use of backward chaining to donn socks; difficulty with coordination noted; pulling up sock as if on foot but not over toes Toilet Transfer: Moderate assistance Toilet Transfer Details (indicate cue type and reason): Ambulated to toilet; Difficulty with turning and stepping back to toilet Toileting- Clothing Manipulation and Hygiene: Moderate assistance Functional mobility during ADLs: Moderate assistance General ADL Comments: Cues for sequencing when washing hands    Mobility  Overal bed mobility: Needs Assistance Bed Mobility: Supine to Sit Rolling: Mod assist Sidelying to sit: Mod assist, HOB elevated Supine to sit: Min assist General bed mobility comments: max directional v/c's , increased time, minA for trunk elevation    Transfers  Overall transfer level: Needs assistance Equipment used: 1 person  hand held assist Transfers: Sit to/from Stand Sit to Stand: Min assist Stand pivot transfers: Mod assist General transfer comment: pt unsteady and fell back posteriorly on bed without assist for stability    Ambulation / Gait / Stairs / Wheelchair Mobility  Ambulation/Gait Ambulation/Gait assistance: Mod assist Gait Distance (Feet): 200 Feet Assistive device: 1 person hand held assist Gait Pattern/deviations: Step-through pattern, Decreased stride length, Staggering right General Gait Details: pt with consistent staggering to the R and R lateral lean, tactile cues at hips to facilitate L LE weight shift, pt with narrow base of support Gait velocity: slow Gait velocity interpretation: <1.31 ft/sec, indicative of household ambulator    Posture / Balance Dynamic Sitting Balance Sitting balance - Comments: started as  mod assist in sitting EOB, but progressed to min guard and close supervision.  Balance Overall balance assessment: Needs assistance Sitting-balance support: Feet supported, Bilateral upper extremity supported Sitting balance-Leahy Scale: Fair Sitting balance - Comments: started as mod assist in sitting EOB, but progressed to min guard and close supervision.  Standing balance support: No upper extremity supported Standing balance-Leahy Scale: Poor Standing balance comment: reliant on external support    Special needs/care consideration BiPAP/CPAP: no CPM: no Continuous Drip IV: 0.9% NaCl with KCl 20 mEq/L infusion Dialysis: no        Days: no Life Vest: no Oxygen: 2L O2 on morning of admit Special Bed: no Trach Size: no Wound Vac (area): no      Location: no Skin: ecchymosis to right side of head; laceration to right upper lateral head                      Bowel mgmt: last BM: 03/04/18; continent Bladder mgmt:external catheter in place Diabetic mgmt: no     Previous Home Environment Living Arrangements: Alone, Spouse/significant other(2-9:30 pm [name]  works) Available Help at Discharge: Family, Available PRN/intermittently Type of Home: Apartment Home Layout: One level Home Access: Stairs to enter Technical brewer of Steps: 1 Bathroom Shower/Tub: Chiropodist: Standard Additional Comments: Information gathered from [place], significant other  Discharge Living Setting Plans for Discharge Living Setting: Patient's home, Lives with (comment), Other (Comment)(boyfriend semi-stays there) Type of Home at Discharge: Apartment Discharge Home Layout: One level Discharge Home Access: Stairs to enter Entrance Stairs-Rails: None Entrance Stairs-Number of Steps: 1 Discharge Bathroom Shower/Tub: Tub/shower unit Discharge Bathroom Toilet: Standard Discharge Bathroom Accessibility: Yes How Accessible: Accessible via walker(possibly if sideways) Does the patient have any problems obtaining your medications?: No  Social/Family/Support Systems Patient Roles: Other (Comment)(full time employee; girlfriend) Contact Information: aunt Urban Gibson): [phone]; grandmother Vania Rea): [phone]; boyfriend Gwendalyn Ege): [phone] Anticipated Caregiver: Laurey Arrow and aunt and grandmother Anticipated Caregiver's Contact Information: see above Ability/Limitations of Caregiver: Min A Caregiver Availability: 24/7 Discharge Plan Discussed with Primary Caregiver: Yes Is Caregiver In Agreement with Plan?: Yes Does Caregiver/Family have Issues with Lodging/Transportation while Pt is in Rehab?: No   Goals/Additional Needs Patient/Family Goal for Rehab: PT/OT/SLP: Supervision Expected length of stay: 17-20 days Cultural Considerations: NA Dietary Needs: regular, thin liquids Equipment Needs: TBD Pt/Family Agrees to Admission and willing to participate: Yes Program Orientation Provided & Reviewed with Pt/Caregiver Including Roles  & Responsibilities: Yes(pt, her boyfriend, and her aunt)  Barriers to Discharge: Home environment  access/layout, Lack of/limited family support  Barriers to Discharge Comments: tub shower, 1 step to enter; tight bathroom space; caregiver support during the day   Decrease burden of Care through IP rehab admission: Other    Possible need for SNF placement upon discharge:Not anticipated; pt has good social support at home with good prognosis for further progress.    Patient Condition: This patient's medical and functional status has changed since the consult dated 03/01/18 in which the Rehabilitation Physician determined and documented that the patient was potentially appropriate for intensive rehabilitative care in an inpatient rehabilitation facility. Issues have been addressed and update has been discussed with Dr. Posey Pronto and patient now appropriate for inpatient rehabilitation. Pt is now able tolerate the intensity of the program and her functional deficits have persisted since completion of medical workup. Pt remains at [facility] for transfers and Mod A for 200 feet ambulation, which is different from her baseline of independence. Will admit to  inpatient rehab today.   Preadmission Screen Completed By:  Jhonnie Garner, 03/05/2018 12:37 PM ______________________________________________________________________   Discussed status with Dr. Posey Pronto on 03/05/18 at 12:27PM and received telephone approval for admission today.  Admission Coordinator:  Jhonnie Garner, time 12:27PM/Date 03/05/18

## 2018-03-04 NOTE — Discharge Instructions (Signed)
[place] [facility], patients with seizures are not allowed to drive until they have been seizure-free for six months. Use caution when using heavy equipment or power tools. Avoid working on ladders or at heights. Take showers instead of baths. Ensure the water temperature is not too high on the home water heater. Do not go swimming alone. Do not lock yourself in a room alone (i.e. bathroom). When caring for infants or small children, sit down when holding, feeding, or changing them to minimize risk of injury to the child in the event you have a seizure. Maintain good sleep hygiene. Avoid alcohol.    If Alyssa LacyChristina Danielle Little has another seizure, call 911 and bring them back to the ED if: A. The seizure lasts longer than 5 minutes.  B. The patient doesn't wake shortly after the seizure or has new problems such as difficulty seeing, speaking or moving following the seizure C. The patient was injured during the seizure D. The patient has a temperature over 102 F (39C) E. The patient vomited during the seizure and now is having trouble breathing

## 2018-03-04 NOTE — Progress Notes (Signed)
Patient ID: Alyssa LacyChristina Danielle Little, female   DOB: 11/03/1980, 37 y.o.   MRN: [id number]    Subjective: Ate her breakfast well  Objective: Vital signs in last 24 hours: Temp:  [98 F (36.7 C)-101 F (38.3 C)] 101 F (38.3 C) (11/19 0800) Pulse Rate:  [82-138] 97 (11/19 0905) Resp:  [0-53] 43 (11/19 0905) BP: (113-141)/(77-118) 119/95 (11/19 0900) SpO2:  [72 %-99 %] 96 % (11/19 0905) Weight:  [47 kg] 47 kg (11/19 0500) Last BM Date: 03/04/18  Intake/Output from previous day: 11/18 0701 - 11/19 0700 In: 895.3 [I.V.:191.3; IV Piggyback:704] Out: 850 [Urine:850] Intake/Output this shift: No intake/output data recorded.  General appearance: no distress Head: R scalp lac with sutures Resp: clear to auscultation bilaterally Cardio: regular rate and rhythm GI: soft, NT Extremities: claves soft Neuro: F/C well, PERL  Lab Results: CBC  No results for input(s): WBC, HGB, HCT, PLT in the last 72 hours. BMET Recent Labs    03/03/18 0420 03/04/18 0425  NA 135 136  K 3.1* 3.4*  CL 106 105  CO2 19* 21*  GLUCOSE 121* 94  BUN <5* <5*  CREATININE <0.30* 0.42*  CALCIUM 7.6* 8.9   PT/INR No results for input(s): LABPROT, INR in the last 72 hours. ABG No results for input(s): PHART, HCO3 in the last 72 hours.  Invalid input(s): PCO2, PO2  Studies/Results: No results found.  Anti-infectives: Anti-infectives (From admission, onward)   Start     Dose/Rate Route Frequency Ordered Stop   02/26/18 1200  piperacillin-tazobactam (ZOSYN) IVPB 3.375 g     3.375 g 12.5 mL/hr over 240 Minutes Intravenous Every 8 hours 02/26/18 1001 03/04/18 0410      Assessment/Plan: Fall TBI/SAH/B F ICC/IVH- Dr. Lovell SheehanJenkins following, TBI team therapies Seizure likely due to above- Keppra, Dilantin and Vimpat per Neurology Complex scalp lac- S/P closure 11/11 by Dr. Andrey CampanileWilson, remove sutures 11/21 ABL anemia-CBC in AM CV- lopressor now just PRN Alcohol abuse disorder- PO Ativan  CIWA Hypokalemia - replete ID: off ABX, watch temp FEN-reg diet, change neuro meds to PO VTE- PAS Dispo- ICU - likely TF out soon if stays off Precedex and no Sz, TBI team therapies   LOS: 8 days    Alyssa GelinasBurke Latrail Pounders, MD, MPH, FACS Trauma: [phone] General Surgery: [phone]  03/04/2018

## 2018-03-04 NOTE — Progress Notes (Signed)
Subjective: The patient is alert and pleasant.  Her boyfriend is at the bedside.  She denies pain.  Objective: Vital signs in last 24 hours: Temp:  [98 F (36.7 C)-100.5 F (38.1 C)] 99 F (37.2 C) (11/19 0400) Pulse Rate:  [82-138] 102 (11/19 0700) Resp:  [0-53] 39 (11/19 0700) BP: (102-141)/(77-118) 116/94 (11/19 0700) SpO2:  [72 %-99 %] 95 % (11/19 0700) Weight:  [47 kg] 47 kg (11/19 0500) Estimated body mass index is 19.58 kg/m as calculated from the following:   Height as of this encounter: 5\' 1"  (1.549 m).   Weight as of this encounter: 47 kg.   Intake/Output from previous day: 11/18 0701 - 11/19 0700 In: 895.3 [I.V.:191.3; IV Piggyback:704] Out: 850 [Urine:850] Intake/Output this shift: No intake/output data recorded.  Physical exam the patient is alert and oriented x2, person and month.  Her speech is normal.  She is moving all 4 extremities well.  Lab Results: No results for input(s): WBC, HGB, HCT, PLT in the last 72 hours. BMET Recent Labs    03/03/18 0420 03/04/18 0425  NA 135 136  K 3.1* 3.4*  CL 106 105  CO2 19* 21*  GLUCOSE 121* 94  BUN <5* <5*  CREATININE <0.30* 0.42*  CALCIUM 7.6* 8.9    Studies/Results: No results found.  Assessment/Plan: Traumatic brain injury, traumatic subarachnoid hemorrhage: The patient is doing much better clinically.  It looks like she will need rehab.  LOS: 8 days     Cristi LoronJeffrey D Antrell Tipler 03/04/2018, 7:35 AM

## 2018-03-04 NOTE — Progress Notes (Signed)
Physical Therapy Treatment Patient Details Name: Alyssa Little MRN: [id number] DOB: 03/27/1981 Today's Date: 03/04/2018    History of Present Illness 37 yo admitted found down on break room at work.  Dx with TBI/SAH/B F ICC/IVH, seizure (likely from above) countinuous EEG finished 11/14 results pending.  MRI pending, complex scalp lac, ABL anemia, Tachycardia, alcohol abuse disorder (CIWA/precedex), hypokalemia, hypophosphatemia, hypo mag, hypocalcemia.  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  Intubated 11/11-11/12. PMhx: ETOH abuse, other not known    PT Comments    Pt improved both cognitively and functionally. Pt cont to be disoriented to place, impaired memory, and impaired problem solving. Pt also presenting with impaired balance and cont to require min/modA for all mobility and ADLs. Cont to recommend CIR upon d/c. Acute PT to cont to follow.   Follow Up Recommendations  CIR     Equipment Recommendations  None recommended by PT    Recommendations for Other Services Rehab consult     Precautions / Restrictions Precautions Precautions: Fall Restrictions Weight Bearing Restrictions: No    Mobility  Bed Mobility Overal bed mobility: Needs Assistance Bed Mobility: Supine to Sit     Supine to sit: Min assist     General bed mobility comments: max directional v/c's , increased time, minA for trunk elevation  Transfers Overall transfer level: Needs assistance Equipment used: 1 person hand held assist Transfers: Sit to/from Stand Sit to Stand: Min assist         General transfer comment: pt unsteady and fell back posteriorly on bed without assist for stability  Ambulation/Gait Ambulation/Gait assistance: Mod assist Gait Distance (Feet): 200 Feet Assistive device: 1 person hand held assist Gait Pattern/deviations: Step-through pattern;Decreased stride length;Staggering right Gait velocity: slow Gait velocity  interpretation: <1.31 ft/sec, indicative of household ambulator General Gait Details: pt with consistent staggering to the R and R lateral lean, tactile cues at hips to facilitate L LE weight shift, pt with narrow base of support   Stairs             Wheelchair Mobility    Modified Rankin (Stroke Patients Only) Modified Rankin (Stroke Patients Only) Pre-Morbid Rankin Score: No symptoms Modified Rankin: Moderately severe disability     Balance Overall balance assessment: Needs assistance Sitting-balance support: Feet supported;Bilateral upper extremity supported Sitting balance-Leahy Scale: Fair     Standing balance support: No upper extremity supported Standing balance-Leahy Scale: Poor Standing balance comment: reliant on external support                            Cognition Arousal/Alertness: Awake/alert Behavior During Therapy: Flat affect Overall Cognitive Status: Impaired/Different from baseline Area of Impairment: Orientation;Attention;Memory;Following commands;Safety/judgement;Awareness;Problem solving;Rancho level               Rancho Levels of Cognitive Functioning [place]ant Scales of Cognitive Functioning: Confused/appropriate Orientation Level: Disoriented to;Place(perseverated on [facility] despite re-orientation) Current Attention Level: Sustained Memory: Decreased recall of precautions;Decreased short-term memory Following Commands: Follows one step commands consistently Safety/Judgement: Decreased awareness of safety;Decreased awareness of deficits Awareness: Emergent Problem Solving: Slow processing;Difficulty sequencing;Requires verbal cues;Requires tactile cues General Comments: pt now reports "I had a seizure and fell at work", pt cont to have memory deficits but is improving in problem solving, with verbal cues pt able to navigate back to room despite not remembering room number      Exercises  General  Comments General comments (skin integrity, edema, etc.): HR increased into 140s with ambulation, resting HR 117 in the bed      Pertinent Vitals/Pain Pain Assessment: No/denies pain    Home Living                      Prior Function            PT Goals (current goals can now be found in the care plan section) Progress towards PT goals: Progressing toward goals    Frequency    Min 3X/week      PT Plan Current plan remains appropriate    Co-evaluation              AM-PAC PT "6 Clicks" Daily Activity  Outcome Measure  Difficulty turning over in bed (including adjusting bedclothes, sheets and blankets)?: A Little Difficulty moving from lying on back to sitting on the side of the bed? : A Little Difficulty sitting down on and standing up from a chair with arms (e.g., wheelchair, bedside commode, etc,.)?: A Little Help needed moving to and from a bed to chair (including a wheelchair)?: A Little Help needed walking in hospital room?: A Little Help needed climbing 3-5 steps with a railing? : A Lot 6 Click Score: 17    End of Session Equipment Utilized During Treatment: Gait belt Activity Tolerance: Patient limited by lethargy;Patient limited by pain;Patient tolerated treatment well Patient left: in chair;with call bell/phone within reach;with chair alarm set;Other (comment);with family/visitor present Nurse Communication: Mobility status PT Visit Diagnosis: Muscle weakness (generalized) (M62.81);Difficulty in walking, not elsewhere classified (R26.2);Pain;Other symptoms and signs involving the nervous system (R29.898)     Time: [id number] PT Time Calculation (min) (ACUTE ONLY): 24 min  Charges:  $Gait Training: 23-37 mins                     Lewis ShockAshly Ziaire Hagos, PT, DPT Acute Rehabilitation Services Pager #: [phone] Office #: [phone]    Iona Hansenshly M Nickolaus Bordelon 03/04/2018, 1:25 PM

## 2018-03-04 NOTE — Progress Notes (Signed)
Inpatient Rehabilitation-Admissions Coordinator    Met with patient and her boyfriend at the bedside to discuss team's recommendation for inpatient rehabilitation. Shared booklets, expectations while in CIR, expected length of stay, and anticipated functional level at DC. Pt and her boyfriend would like to proceed with CIR. Pt gave permission to call her aunt Urban Gibson and grandparents to see if they would be able to assist in the DC planning process. Currently, pt's boyfriend able to provide assist from morning until 2pm as he works form 2-9:30pm. [facility] did speak with Urban Gibson who stated she is willing to work out a schedule for assist but will confirm later today.   AC has began insurance authorization process for possible admit.   Will continue to follow for medical readiness.   Jhonnie Garner, OTR/L  Rehab Admissions Coordinator  [phone] 03/04/2018 12:16 PM

## 2018-03-05 ENCOUNTER — Inpatient Hospital Stay (HOSPITAL_COMMUNITY): Payer: Managed Care, Other (non HMO)

## 2018-03-05 ENCOUNTER — Other Ambulatory Visit: Payer: Self-pay

## 2018-03-05 ENCOUNTER — Inpatient Hospital Stay (HOSPITAL_COMMUNITY)
Admission: RE | Admit: 2018-03-05 | Discharge: 2018-03-11 | DRG: 945 | Disposition: A | Discharge status: Home / Self Care | Payer: Managed Care, Other (non HMO) | Source: Intra-hospital | Attending: Physical Medicine & Rehabilitation | Admitting: Physical Medicine & Rehabilitation

## 2018-03-05 ENCOUNTER — Encounter (HOSPITAL_COMMUNITY): Payer: Self-pay

## 2018-03-05 DIAGNOSIS — G40909 Epilepsy, unspecified, not intractable, without status epilepticus: Secondary | ICD-10-CM | POA: Diagnosis present

## 2018-03-05 DIAGNOSIS — F1090 Alcohol use, unspecified, uncomplicated: Secondary | ICD-10-CM

## 2018-03-05 DIAGNOSIS — G9349 Other encephalopathy: Secondary | ICD-10-CM | POA: Diagnosis present

## 2018-03-05 DIAGNOSIS — R740 Nonspecific elevation of levels of transaminase and lactic acid dehydrogenase [LDH]: Secondary | ICD-10-CM | POA: Diagnosis present

## 2018-03-05 DIAGNOSIS — R569 Unspecified convulsions: Secondary | ICD-10-CM | POA: Diagnosis not present

## 2018-03-05 DIAGNOSIS — R7401 Elevation of levels of liver transaminase levels: Secondary | ICD-10-CM

## 2018-03-05 DIAGNOSIS — D62 Acute posthemorrhagic anemia: Secondary | ICD-10-CM | POA: Diagnosis present

## 2018-03-05 DIAGNOSIS — Z7289 Other problems related to lifestyle: Secondary | ICD-10-CM

## 2018-03-05 DIAGNOSIS — Y900 Blood alcohol level of less than 20 mg/100 ml: Secondary | ICD-10-CM | POA: Diagnosis present

## 2018-03-05 DIAGNOSIS — S066X9D Traumatic subarachnoid hemorrhage with loss of consciousness of unspecified duration, subsequent encounter: Secondary | ICD-10-CM | POA: Diagnosis present

## 2018-03-05 DIAGNOSIS — Z7141 Alcohol abuse counseling and surveillance of alcoholic: Secondary | ICD-10-CM | POA: Diagnosis not present

## 2018-03-05 DIAGNOSIS — S069X3S Unspecified intracranial injury with loss of consciousness of 1 hour to 5 hours 59 minutes, sequela: Secondary | ICD-10-CM

## 2018-03-05 DIAGNOSIS — S069X9A Unspecified intracranial injury with loss of consciousness of unspecified duration, initial encounter: Secondary | ICD-10-CM | POA: Diagnosis present

## 2018-03-05 DIAGNOSIS — Z87820 Personal history of traumatic brain injury: Secondary | ICD-10-CM | POA: Diagnosis present

## 2018-03-05 DIAGNOSIS — F1010 Alcohol abuse, uncomplicated: Secondary | ICD-10-CM

## 2018-03-05 DIAGNOSIS — S069XAA Unspecified intracranial injury with loss of consciousness status unknown, initial encounter: Secondary | ICD-10-CM | POA: Diagnosis present

## 2018-03-05 LAB — COMPREHENSIVE METABOLIC PANEL
ALBUMIN: 2.5 g/dL — AB (ref 3.5–5.0)
ALK PHOS: 92 U/L (ref 38–126)
ALT: 69 U/L — AB (ref 0–44)
AST: 106 U/L — AB (ref 15–41)
Anion gap: 9 (ref 5–15)
BILIRUBIN TOTAL: 0.6 mg/dL (ref 0.3–1.2)
CALCIUM: 9.1 mg/dL (ref 8.9–10.3)
CO2: 23 mmol/L (ref 22–32)
Chloride: 103 mmol/L (ref 98–111)
Creatinine, Ser: 0.45 mg/dL (ref 0.44–1.00)
GFR calc Af Amer: >60 mL/min (ref >60)
GFR calc non Af Amer: >60 mL/min (ref >60)
GLUCOSE: 100 mg/dL — AB (ref 70–99)
Potassium: 3.5 mmol/L (ref 3.5–5.1)
Sodium: 135 mmol/L (ref 135–145)
Total Protein: 5.8 g/dL — ABNORMAL LOW (ref 6.5–8.1)

## 2018-03-05 LAB — GLUCOSE, CAPILLARY
Glucose-Capillary: 95 mg/dL (ref 70–99)
Glucose-Capillary: 86 mg/dL (ref 70–99)
Glucose-Capillary: 126 mg/dL — ABNORMAL HIGH (ref 70–99)

## 2018-03-05 LAB — PHENYTOIN LEVEL, TOTAL: Phenytoin Lvl: <2.5 ug/mL — ABNORMAL LOW (ref 10.0–20.0)

## 2018-03-05 LAB — CBC
HEMATOCRIT: 34.7 % — AB (ref 36.0–46.0)
Hemoglobin: 11.4 g/dL — ABNORMAL LOW (ref 12.0–15.0)
MCH: 32.5 pg (ref 26.0–34.0)
MCHC: 32.9 g/dL (ref 30.0–36.0)
MCV: 98.9 fL (ref 80.0–100.0)
NRBC: 0 % (ref 0.0–0.2)
Platelets: 593 10*3/uL — ABNORMAL HIGH (ref 150–400)
RBC: 3.51 MIL/uL — AB (ref 3.87–5.11)
RDW: 19 % — ABNORMAL HIGH (ref 11.5–15.5)
WBC: 8.3 10*3/uL (ref 4.0–10.5)

## 2018-03-05 MED ORDER — ADULT MULTIVITAMIN W/MINERALS CH
1.0000 | ORAL_TABLET | Freq: Every day | ORAL | Status: DC
  Administered 2018-03-06 – 2018-03-11 (×6): 1 via ORAL
  Filled 2018-03-05 (×6): qty 1

## 2018-03-05 MED ORDER — PHENYTOIN 125 MG/5ML PO SUSP
150.0000 mg | Freq: Three times a day (TID) | ORAL | Status: DC
  Administered 2018-03-05 – 2018-03-11 (×17): 150 mg via ORAL
  Filled 2018-03-05 (×19): qty 8

## 2018-03-05 MED ORDER — VITAMIN B-1 100 MG PO TABS
100.0000 mg | ORAL_TABLET | Freq: Every day | ORAL | Status: DC
  Administered 2018-03-06 – 2018-03-07 (×2): 100 mg
  Filled 2018-03-05 (×2): qty 1

## 2018-03-05 MED ORDER — SORBITOL 70 % SOLN: 30.0000 mL | Freq: Every day | Status: DC | PRN

## 2018-03-05 MED ORDER — LACOSAMIDE 50 MG PO TABS
200.0000 mg | ORAL_TABLET | Freq: Two times a day (BID) | ORAL | Status: DC
  Administered 2018-03-05 – 2018-03-11 (×12): 200 mg via ORAL
  Filled 2018-03-05 (×12): qty 4

## 2018-03-05 MED ORDER — PHENYTOIN 125 MG/5ML PO SUSP
150.0000 mg | Freq: Three times a day (TID) | ORAL | Status: DC
  Administered 2018-03-05: 150 mg via ORAL
  Filled 2018-03-05 (×2): qty 8

## 2018-03-05 MED ORDER — ENSURE ENLIVE PO LIQD
237.0000 mL | Freq: Two times a day (BID) | ORAL | Status: DC
  Administered 2018-03-06 – 2018-03-10 (×10): 237 mL via ORAL

## 2018-03-05 MED ORDER — LEVETIRACETAM 750 MG PO TABS
1500.0000 mg | ORAL_TABLET | Freq: Two times a day (BID) | ORAL | Status: DC
  Administered 2018-03-05 – 2018-03-11 (×12): 1500 mg via ORAL
  Filled 2018-03-05 (×12): qty 2

## 2018-03-05 MED ORDER — ACETAMINOPHEN 325 MG PO TABS
325.0000 mg | ORAL_TABLET | ORAL | Status: DC | PRN
  Administered 2018-03-05 – 2018-03-07 (×3): 650 mg via ORAL
  Filled 2018-03-05 (×3): qty 2

## 2018-03-05 MED ORDER — FOLIC ACID 1 MG PO TABS
1.0000 mg | ORAL_TABLET | Freq: Every day | ORAL | Status: DC
  Administered 2018-03-06 – 2018-03-07 (×2): 1 mg via ORAL
  Filled 2018-03-05 (×2): qty 1

## 2018-03-05 NOTE — Progress Notes (Signed)
Inpatient Rehabilitation-Admissions Coordinator   [facility]. AC has updated pt and boyfriend regarding plan with both in agreement. RN, CM, and SW aware.   Please call if questions.   Nanine MeansKelly Aren Cherne, OTR/L  Rehab Admissions Coordinator  [phone] 03/05/2018 12:05 PM

## 2018-03-05 NOTE — Progress Notes (Signed)
Physical Medicine and Rehabilitation Consult Reason for Consult: Trauma Referring Physician: Kinsinger, De Blanch, MD   HPI: Alyssa Little is a 37 y.o. female presented on 02/24/2018 with head trauma.  History taken from chart review.  Patient reportedly with fall and seizures.  EMS was called and patient received antiepileptics.  She required intubation.  Head CT reviewed showing SAH.  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  Neurology was consulted.  EEG ordered.  Neurosurgery consulted, plan was to place a ventriculostomy, however patient's neurological status improved.  Recommended conservative care with follow-up head CT next week and spine x-rays.    Review of Systems  Unable to perform ROS: Mental acuity   No past medical history on file.,  Unable to obtain from patient No past surgical history on file, unable to obtain from patient No family history on file.,  Unable to obtain from patient Social History:  has no tobacco, alcohol, and drug history on file. Allergies:       Allergies  Allergen Reactions  . Shellfish Allergy Other (See Comments)    Unknown per family and friend   No medications prior to admission.    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  Transfers Overall transfer level: Needs assistance Equipment used: None Transfers: Sit to/from Stand, Stand Pivot Transfers Sit to Stand: Mod assist, +2 physical assistance Stand pivot transfers: Mod assist, +2 physical assistance General transfer comment: Two person light mod assist to stand and take a few pivotal steps around to the recliner chair with therapists supporting trunk over weak legs.  Pt did not really hold onto [name] with her arms, took most of her weight through her feet.  Ambulation/Gait General Gait Details: unable at this time.   ADL: ADL Overall ADL's : Needs assistance/impaired Eating/Feeding: NPO Grooming: Moderate assistance, Oral care, Wash/dry face Grooming Details (indicate cue type and reason): cues to sustain attention to task due to lethargy Functional mobility during ADLs: +2 for physical assistance, Moderate assistance General ADL Comments: Pt ableto intiate stand and step to chair. Increased attention to ADL tasks  Cognition: Cognition Overall Cognitive Status: Impaired/Different from baseline Arousal/Alertness: Awake/alert Orientation Level: Oriented to person, Disoriented to place, Disoriented to time, Disoriented to situation Attention: Focused Focused Attention: Impaired Focused Attention Impairment: Verbal basic Memory: Impaired Memory Impairment: Storage deficit Awareness: Impaired Awareness Impairment: Intellectual impairment Behaviors: Restless, Physical agitation, Poor frustration tolerance Safety/Judgment: Impaired Rancho Mirant Scales of Cognitive Functioning: Confused/inappropriate/non-agitated(more V than IV, but still seems to have some IV moments) Cognition Arousal/Alertness:  Lethargic Behavior During Therapy: Restless, Flat affect Overall Cognitive Status: Impaired/Different from baseline Area of Impairment: Orientation, Attention, Memory, Following commands, Awareness, Safety/judgement, Problem solving, Rancho level Orientation Level: Disoriented to, Place, Time, Situation("hospital") Current Attention Level: Sustained  Memory: Decreased recall of precautions, Decreased short-term memory Following Commands: Follows one step commands consistently, Follows one step commands with increased time Safety/Judgement: Decreased awareness of safety, Decreased awareness of deficits Awareness: Intellectual Problem Solving: Slow processing, Decreased initiation, Difficulty sequencing, Requires verbal cues, Requires tactile cues General Comments: Precedex turned down but not off; ablet o sutain attention to task for short periods of time; Able to sustain attneiton to brush teeth; pt appeasr to perseverate at times; Difficulty maintaining arousal throughout session which is most likely affected by medication in addition to TBI.  Eyes open ~25% of time, seems to be light sensative, expressing some basic needs, recognized her grandmother  Blood pressure 104/85, pulse 98, temperature 97.9 F (36.6 C), resp. rate (!) 37, height 5\' 1"  (1.549 m), weight 46.4 kg, last menstrual period 11/29/2017, SpO2 96 %. Physical Exam  Constitutional: She appears well-developed and well-nourished.  HENT:  Traumatic + NG  Eyes: Right eye exhibits no discharge. Left eye exhibits no discharge.  Neck:  C-collar in place  Cardiovascular: Regular rhythm.  Tachycardia  Respiratory: Breath sounds normal.  Tachypnea  GI: Soft. Bowel sounds are normal.  Musculoskeletal: She exhibits no edema or tenderness.  Neurological: She is alert.  Incomprehensible verbal output Moving all extremities spontaneously  Skin:  Head trauma  Psychiatric:  Unable to assess due to mentation         Results for  orders placed or performed during the hospital encounter of 02/24/18 (from the past 24 hour(s))  Glucose, capillary     Status: Abnormal   Collection Time: 02/28/18  7:53 PM  Result Value Ref Range   Glucose-Capillary 134 (H) 70 - 99 mg/dL  Glucose, capillary     Status: Abnormal   Collection Time: 02/28/18 11:41 PM  Result Value Ref Range   Glucose-Capillary 142 (H) 70 - 99 mg/dL  Basic metabolic panel     Status: Abnormal   Collection Time: 03/01/18  3:16 AM  Result Value Ref Range   Sodium 137 135 - 145 mmol/L   Potassium 4.2 3.5 - 5.1 mmol/L   Chloride 112 (H) 98 - 111 mmol/L   CO2 19 (L) 22 - 32 mmol/L   Glucose, Bld 114 (H) 70 - 99 mg/dL   BUN 5 (L) 6 - 20 mg/dL   Creatinine, Ser 1.61 0.44 - 1.00 mg/dL   Calcium 7.2 (L) 8.9 - 10.3 mg/dL   GFR calc non Af Amer >60 >60 mL/min   GFR calc Af Amer >60 >60 mL/min   Anion gap 6 5 - 15  Magnesium     Status: None   Collection Time: 03/01/18  3:16 AM  Result Value Ref Range   Magnesium 1.9 1.7 - 2.4 mg/dL  Phosphorus     Status: Abnormal   Collection Time: 03/01/18  3:16 AM  Result Value Ref Range   Phosphorus <1.0 (LL) 2.5 - 4.6 mg/dL  Glucose, capillary     Status: Abnormal   Collection Time: 03/01/18  3:26 AM  Result Value Ref Range   Glucose-Capillary 106 (H) 70 - 99 mg/dL  Basic metabolic panel     Status: Abnormal   Collection Time: 03/01/18  6:22 AM  Result Value Ref Range   Sodium 137 135 - 145 mmol/L   Potassium 3.7 3.5 - 5.1 mmol/L   Chloride 109 98 - 111 mmol/L   CO2 21 (L) 22 - 32 mmol/L   Glucose, Bld 138 (H) 70 - 99 mg/dL   BUN 5 (L)  6 - 20 mg/dL   Creatinine, Ser 1.61 0.44 - 1.00 mg/dL   Calcium 7.5 (L) 8.9 - 10.3 mg/dL   GFR calc non Af Amer >60 >60 mL/min   GFR calc Af Amer >60 >60 mL/min   Anion gap 7 5 - 15  Phosphorus     Status: Abnormal   Collection Time: 03/01/18  6:22 AM  Result Value Ref Range   Phosphorus <1.0 (LL) 2.5 - 4.6 mg/dL  CBC     Status: Abnormal    Collection Time: 03/01/18  6:22 AM  Result Value Ref Range   WBC 8.2 4.0 - 10.5 K/uL   RBC 3.63 (L) 3.87 - 5.11 MIL/uL   Hemoglobin 11.6 (L) 12.0 - 15.0 g/dL   HCT 09.6 (L) 04.5 - 40.9 %   MCV 98.3 80.0 - 100.0 fL   MCH 32.0 26.0 - 34.0 pg   MCHC 32.5 30.0 - 36.0 g/dL   RDW 81.1 (H) 91.4 - 78.2 %   Platelets 188 150 - 400 K/uL   nRBC 0.0 0.0 - 0.2 %  Glucose, capillary     Status: Abnormal   Collection Time: 03/01/18  7:45 AM  Result Value Ref Range   Glucose-Capillary 138 (H) 70 - 99 mg/dL  Phenytoin level, total     Status: Abnormal   Collection Time: 03/01/18  8:54 AM  Result Value Ref Range   Phenytoin Lvl 3.4 (L) 10.0 - 20.0 ug/mL  Glucose, capillary     Status: Abnormal   Collection Time: 03/01/18 11:59 AM  Result Value Ref Range   Glucose-Capillary 120 (H) 70 - 99 mg/dL  Phosphorus     Status: None   Collection Time: 03/01/18  2:38 PM  Result Value Ref Range   Phosphorus 2.6 2.5 - 4.6 mg/dL   Dg Chest Port 1 View  Result Date: 02/28/2018 CLINICAL DATA:  Tachypnea.  Febrile. EXAM: PORTABLE CHEST 1 VIEW COMPARISON:  02/26/2018 FINDINGS: Diffuse bilateral airspace disease with mild progression. Small left effusion. Feeding tube placed in the interval with the tip not visualized IMPRESSION: Diffuse bilateral airspace disease with mild progression. Probable edema however pneumonia possible. Electronically Signed   By: Marlan Palau M.D.   On: 02/28/2018 10:21    Assessment/Plan: Diagnosis: Seizures with TBI Labs and images (see above) independently reviewed.  Records reviewed and summated above.             [place] score:  IV             Speech to evaluate for Post traumatic amnesia and interval GOAT scores to assess progress.             NeuroPsych evaluation for behavorial assessment.             Provide environmental management by reducing the level of stimulation, tolerating restlessness when possible, protecting patient from  harming self or others and reducing patient's cognitive confusion.             Address behavioral concerns include providing structured environments and daily routines.             Cognitive therapy to direct modular abilities in order to maintain goals        including problem solving, self regulation/monitoring, self management, attention, and memory.             Fall precautions; pt at risk for second impact syndrome             Prevention of secondary  injury: monitor for hypotension, hypoxia, seizures or signs of increased ICP             AED:              Consider pharmacological intervention if necessary with neurostimulants,  Such as amantadine, methylphenidate, modafinil, etc.             Consider Propranolol for agitation and storming             Avoid medications that could impair cognitive abilities, such as anticholinergics, antihistaminic, benzodiazapines, narcotics, etc when possible   1. Does the need for close, 24 hr/day medical supervision in concert with the patient's rehab needs make it unreasonable for this patient to be served in a less intensive setting? Likely 2. Co-Morbidities requiring supervision/potential complications: tachypnea (monitor RR and O2 Sats with increased physical exertion), Tachycardia (monitor in accordance with pain and increasing activity), seizures (cont meds), ABLA (repeat labs, transfuse to ensure appropriate perfusion for increased activity tolerance) 3. Due to bladder management, bowel management, safety, skin/wound care, disease management, medication administration, pain management and patient education, does the patient require 24 hr/day rehab nursing? Yes 4. Does the patient require coordinated care of a physician, rehab nurse, PT (1-2 hrs/day, 5 days/week), OT (1-2 hrs/day, 5 days/week) and SLP (1-2 hrs/day, 5 days/week) to address physical and functional deficits in the context of the above medical diagnosis(es)? Yes Addressing deficits in the  following areas: balance, endurance, locomotion, strength, transferring, bowel/bladder control, bathing, dressing, feeding, grooming, toileting, cognition, speech, language, swallowing and psychosocial support 5. Can the patient actively participate in an intensive therapy program of at least 3 hrs of therapy per day at least 5 days per week? Potentially 6. The potential for patient to make measurable gains while on inpatient rehab is excellent 7. Anticipated functional outcomes upon discharge from inpatient rehab are supervision  with PT, supervision with OT, supervision with SLP. 8. Estimated rehab length of stay to reach the above functional goals is: 17-20 days. 9. Anticipated D/C setting: Home 10. Anticipated post D/C treatments: HH therapy and Home excercise program 11. Overall Rehab/Functional Prognosis: good  RECOMMENDATIONS: This patient's condition is appropriate for continued rehabilitative care in the following setting: Likely CIR.  Will need to await completion of medical work-up.  If patient's functional deficits persist after completion will consider CIR. Patient has agreed to participate in recommended program. Potentially Note that insurance prior authorization may be required for reimbursement for recommended care.  Comment: Rehab Admissions Coordinator to follow up.   Maryla MorrowAnkit Patel, MD, ABPMR 03/01/2018

## 2018-03-05 NOTE — Progress Notes (Signed)
PMR Admission Coordinator Pre-Admission Assessment  Patient: Alyssa Little is an 37 y.o., female MRN: [id number] DOB: [birth year] Height: '5\' 1"'  (154.9 cm) Weight: 47 kg                                                                                                                                                  Insurance Information HMO:     PPO: Yes     PCP:      IPA:      80/20:      OTHER:  PRIMARY: [employer]      Policy#: [id number]      Subscriber: Patient CM Name: Alyssa Little       Phone#: [phone]     Fax#: [phone] Pre-Cert#: [id number]      Employer:  Josem Kaufmann provided by Luisa Hart at [facility] for admit to CIR; Pt is approved for 11/20-11/26.  Benefits:  Phone #: [phone]     Name: automated phone line + availity  Eff. Date: 04/16/17     Deduct: $2,250 (met $0)      Out of Pocket Max: $4,850 (met $0)      Life Max: NA CIR: 70%/30%      SNF: 70%/30% with 180 days/cal year Outpatient:  70%; 180 days comb all therapies    Co-Pay: 30% Home Health: 70%; per necssity      Co-Pay: 30% DME: 70%     Co-Pay: 30% Providers:  SECONDARY:       Policy#:       Subscriber:  CM Name:       Phone#:      Fax#:  Pre-Cert#:       Employer:  Benefits:  Phone #:      Name:  Eff. Date:      Deduct:       Out of Pocket Max:       Life Max:  CIR:      SNF:  Outpatient:      Co-Pay:  Home Health:       Co-Pay:  DME:      Co-Pay:   Medicaid Application Date:       Case Manager:  Disability Application Date:       Case Worker:   Emergency Publishing copy Information    Name Relation Home Work Mobile   one, no    [phone]     Current Medical History  Patient Admitting Diagnosis: Seizures with TBI History of Present Illness: Alyssa Little is a 37 year old right-handed female with unremarkable past medical historyexcept for reported alcohol use. Patient lives alone. She has a boyfriend. One level home one step to  entry. Independent prior to admission working full time. Boyfriend is available except works from 2 PM  to 9:30 PM.Her aunt and grandparents are planning to assist as needed. Presented 02/24/2018 after being found down in a break room at work convulsing with bleeding from scalp. UDS negative and alcohol [id number]. Cranial CT scan showed small to moderate volume traumatic subarachnoid hemorrhage along both sylvian fissures and the cerebral convexities, more so on the right. Large right anterior convexity scalp hematoma but no underlying skull fracture identified. CT cervical spine and maxillofacial negative for fracture. Patient did require intubation. Continuous EEG showed showing no definite seizure. Mild to moderate diffuse encephalopathy.. Neurosurgery Dr. JArnoldo Moralefollow-up with initial plan for ventriculostomy outpatient neurological status improved and ventriculostomy held. Latest cranial CT scan showed interval decrease in subarachnoid hemorrhage as compared to previous imaging with no hydrocephalus. Patient is Currently maintained on Vimpat, Keppra and Dilantin. Tolerating a regular diet. Therapy evaluations completed with recommendations of physical medicine rehabilitation consult. Patient is to be admitted for a comprehensive rehabilitation program on 03/05/18.  Past Medical History  No past medical history on file.  Family History  family history is not on file.  Prior Rehab/Hospitalizations:  Has the patient had major surgery during 100 days prior to admission? No  Current Medications   Current Facility-Administered Medications:  .  0.9 % NaCl with KCl 20 mEq/ L  infusion, , Intravenous, Continuous, Kinsinger, LArta Bruce MD, Stopped at 03/03/18 1632 .  acetaminophen (TYLENOL) suppository 650 mg, 650 mg, Rectal, Q4H PRN, KGreta Doom MD, 650 mg at 03/02/18 1644 .  chlorhexidine (PERIDEX) 0.12 % solution 15 mL, 15 mL, Mouth Rinse, BID, WJudeth Horn MD, 15 mL at 03/05/18 0956 .   feeding supplement (ENSURE ENLIVE) (ENSURE ENLIVE) liquid 237 mL, 237 mL, Oral, BID BM, WJudeth Horn MD, 237 mL at 03/05/18 0953 .  folic acid (FOLVITE) tablet 1 mg, 1 mg, Per Tube, Daily, ESkeet Simmer RPH, 1 mg at 03/05/18 08469.  Influenza vac split quadrivalent PF (FLUARIX) injection 0.5 mL, 0.5 mL, Intramuscular, Tomorrow-1000, OSaverio Danker PA-C .  lacosamide (VIMPAT) tablet 200 mg, 200 mg, Oral, BID, TGeorganna Skeans MD, 200 mg at 03/05/18 0954 .  levETIRAcetam (KEPPRA) tablet 1,500 mg, 1,500 mg, Oral, BID, TGeorganna Skeans MD, 1,500 mg at 03/05/18 0954 .  LORazepam (ATIVAN) tablet 1 mg, 1 mg, Oral, Q6H PRN, 1 mg at 03/05/18 0531 **OR** LORazepam (ATIVAN) injection 1 mg, 1 mg, Intravenous, Q3H PRN, TDonnie Mesa MD, 1 mg at 03/04/18 2321 .  MEDLINE mouth rinse, 15 mL, Mouth Rinse, q12n4p, WJudeth Horn MD, 15 mL at 03/04/18 1635 .  metoprolol tartrate (LOPRESSOR) injection 2.5 mg, 2.5 mg, Intravenous, Q6H PRN, TGeorganna Skeans MD, 2.5 mg at 02/26/18 2059 .  midazolam (VERSED) injection 2-4 mg, 2-4 mg, Intravenous, Q1H PRN, WJudeth Horn MD, 2 mg at 03/01/18 0610 .  multivitamin with minerals tablet 1 tablet, 1 tablet, Oral, Daily, WJudeth Horn MD, 1 tablet at 03/05/18 0954 .  phenytoin (DILANTIN) 125 MG/5ML suspension 150 mg, 150 mg, Oral, TID, WJudeth Horn MD .  pneumococcal 23 valent vaccine (PNU-IMMUNE) injection 0.5 mL, 0.5 mL, Intramuscular, Tomorrow-1000, OSaverio Danker PA-C .  thiamine (VITAMIN B-1) tablet 100 mg, 100 mg, Per Tube, Daily, ESkeet Simmer RPH, 100 mg at 03/05/18 06295 Patients Current Diet:     Diet Order                  Diet regular Room service appropriate? 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  Independent  Indoor Mobility: Did the patient need assistance with walking from room to room (with or without device)? Independent  Stairs: Did the patient need assistance with internal or external stairs (with or without device)? Independent  Functional Cognition: Did the patient need help planning regular tasks such as shopping or remembering to take medications? Independent  Current Functional Level Cognition  Arousal/Alertness: Awake/alert Overall Cognitive Status: Impaired/Different from baseline Current Attention Level: Sustained Orientation Level: Oriented to person, Disoriented to place, Disoriented to time, Disoriented to situation Following Commands: Follows one step commands consistently Safety/Judgement: Decreased awareness of safety, Decreased awareness of deficits General Comments: pt now reports "I had a seizure and fell at work", pt cont to have memory deficits but is improving in problem solving, with verbal cues pt able to navigate back to room despite not remembering room number Attention: Focused Focused Attention: Impaired Focused Attention Impairment: Verbal basic Memory: Impaired Memory Impairment: Storage deficit Awareness: Impaired Awareness Impairment: Intellectual impairment Behaviors: Restless, Physical agitation, Poor frustration tolerance Safety/Judgment:  Impaired [employer] Scales of Cognitive Functioning: Confused/appropriate    Extremity Assessment (includes Sensation/Coordination)  Upper Extremity Assessment: Generalized weakness, RUE deficits/detail, LUE deficits/detail RUE Deficits / Details: Using R UE funcitonally during ADL; noted difficulty with fine motor control and coordination;  LUE Deficits / Details: using LUE spontaneously; doifficulty with coordination  Lower Extremity Assessment: Defer to PT evaluation RLE Deficits / Details: Pt actively moves both LEs, just not to command right now.  ROM good, took some light weight on legs in standing, but did not kick in extension or WB as well as I anticiapted (she was also sedated).  LLE Deficits / Details: essentially the same as her left leg.      ADLs  Overall ADL's : Needs assistance/impaired Eating/Feeding: Moderate assistance Eating/Feeding Details (indicate cue type and reason): liquids only; coretrack Grooming: Moderate assistance, Standing, Cueing for safety, Cueing for sequencing Grooming Details (indicate cue type and reason): note undershooting when reaching for water; cues for sequencing (using soap); cues for thouroughness of activity Lower Body Dressing: Moderate assistance Lower Body Dressing Details (indicate cue type and reason): use of backward chaining to donn socks; difficulty with coordination noted; pulling up sock as if on foot but not over toes Toilet Transfer: Moderate assistance Toilet Transfer Details (indicate cue type and reason): Ambulated to toilet; Difficulty with turning and stepping back to toilet Toileting- Clothing Manipulation and Hygiene: Moderate assistance Functional mobility during ADLs: Moderate assistance General ADL Comments: Cues for sequencing when washing hands    Mobility  Overal bed mobility: Needs Assistance Bed Mobility: Supine to Sit Rolling: Mod assist Sidelying to sit: Mod assist, HOB elevated Supine to sit: Min  assist General bed mobility comments: max directional v/c's , increased time,  minA for trunk elevation    Transfers  Overall transfer level: Needs assistance Equipment used: 1 person hand held assist Transfers: Sit to/from Stand Sit to Stand: Min assist Stand pivot transfers: Mod assist General transfer comment: pt unsteady and fell back posteriorly on bed without assist for stability    Ambulation / Gait / Stairs / Wheelchair Mobility  Ambulation/Gait Ambulation/Gait assistance: Mod assist Gait Distance (Feet): 200 Feet Assistive device: 1 person hand held assist Gait Pattern/deviations: Step-through pattern, Decreased stride length, Staggering right General Gait Details: pt with consistent staggering to the R and R lateral lean, tactile cues at hips to facilitate L LE weight shift, pt with narrow base of support Gait velocity: slow Gait velocity interpretation: <1.31 ft/sec, indicative of household ambulator    Posture / Balance Dynamic Sitting Balance Sitting balance - Comments: started as mod assist in sitting EOB, but progressed to min guard and close supervision.  Balance Overall balance assessment: Needs assistance Sitting-balance support: Feet supported, Bilateral upper extremity supported Sitting balance-Leahy Scale: Fair Sitting balance - Comments: started as mod assist in sitting EOB, but progressed to min guard and close supervision.  Standing balance support: No upper extremity supported Standing balance-Leahy Scale: Poor Standing balance comment: reliant on external support    Special needs/care consideration BiPAP/CPAP: no CPM: no Continuous Drip IV: 0.9% NaCl with KCl 20 mEq/L infusion Dialysis: no        Days: no Life Vest: no Oxygen: 2L O2 on morning of admit Special Bed: no Trach Size: no Wound Vac (area): no      Location: no Skin: ecchymosis to right side of head; laceration to right upper lateral head                      Bowel mgmt: last BM:  03/04/18; continent Bladder mgmt:external catheter in place Diabetic mgmt: no     Previous Home Environment Living Arrangements: Alone, Spouse/significant other(2-9:30 pm [name] works) Available Help at Discharge: Family, Available PRN/intermittently Type of Home: Apartment Home Layout: One level Home Access: Stairs to enter Technical brewer of Steps: 1 Bathroom Shower/Tub: Chiropodist: Standard Additional Comments: Information gathered from [place], significant other  Discharge Living Setting Plans for Discharge Living Setting: Patient's home, Lives with (comment), Other (Comment)(boyfriend semi-stays there) Type of Home at Discharge: Apartment Discharge Home Layout: One level Discharge Home Access: Stairs to enter Entrance Stairs-Rails: None Entrance Stairs-Number of Steps: 1 Discharge Bathroom Shower/Tub: Tub/shower unit Discharge Bathroom Toilet: Standard Discharge Bathroom Accessibility: Yes How Accessible: Accessible via walker(possibly if sideways) Does the patient have any problems obtaining your medications?: No  Social/Family/Support Systems Patient Roles: Other (Comment)(full time employee; girlfriend) Contact Information: aunt Urban Gibson): [phone]; grandmother Vania Rea): [phone]; boyfriend Gwendalyn Ege): [phone] Anticipated Caregiver: Laurey Arrow and aunt and grandmother Anticipated Caregiver's Contact Information: see above Ability/Limitations of Caregiver: Min A Caregiver Availability: 24/7 Discharge Plan Discussed with Primary Caregiver: Yes Is Caregiver In Agreement with Plan?: Yes Does Caregiver/Family have Issues with Lodging/Transportation while Pt is in Rehab?: No   Goals/Additional Needs Patient/Family Goal for Rehab: PT/OT/SLP: Supervision Expected length of stay: 17-20 days Cultural Considerations: NA Dietary Needs: regular, thin liquids Equipment Needs: TBD Pt/Family Agrees to Admission and willing to participate:  Yes Program Orientation Provided & Reviewed with Pt/Caregiver Including Roles  & Responsibilities: Yes(pt, her boyfriend, and her aunt)  Barriers to Discharge: Home environment access/layout, Lack of/limited family support  Barriers to Discharge Comments: tub shower, 1 step to enter; tight bathroom space; caregiver  support during the day   Decrease burden of Care through IP rehab admission: Other    Possible need for SNF placement upon discharge:Not anticipated; pt has good social support at home with good prognosis for further progress.    Patient Condition: This patient's medical and functional status has changed since the consult dated 03/01/18 in which the Rehabilitation Physician determined and documented that the patient was potentially appropriate for intensive rehabilitative care in an inpatient rehabilitation facility. Issues have been addressed and update has been discussed with Dr. Posey Pronto and patient now appropriate for inpatient rehabilitation. Pt is now able tolerate the intensity of the program and her functional deficits have persisted since completion of medical workup. Pt remains at [facility] for transfers and Mod A for 200 feet ambulation, which is different from her baseline of independence. Will admit to inpatient rehab today.   Preadmission Screen Completed By:  Jhonnie Garner, 03/05/2018 12:37 PM ______________________________________________________________________   Discussed status with Dr. Posey Pronto on 03/05/18 at 12:27PM and received telephone approval for admission today.  Admission Coordinator:  Jhonnie Garner, time 12:27PM/Date 03/05/18           Cosigned by: Jamse Arn, MD at 03/05/2018 12:43 PM  Revision History

## 2018-03-05 NOTE — H&P (Signed)
Physical Medicine and Rehabilitation Admission H&P     HPI: Alyssa Little is a 37 year old right-handed female with unremarkable past medical history except for reported alcohol use. History taken from chart review. Patient lives alone. She has a boyfriend. One level home one step to entry. Independent prior to admission working full time. Boyfriend is available except works from 2 PM to 9:30 PM.Her aunt and grandparents are planning to assist as needed. Presented 02/24/2018 after being found down in a break room at work convulsing with bleeding from scalp. UDS negative and alcohol [id number]. Cranial CT scan reviewed, showing b/l SAH.  Per report, small to moderate volume traumatic subarachnoid hemorrhage along both sylvian fissures and the cerebral convexities, more so on the right. Large right anterior convexity scalp hematoma but no underlying skull fracture identified. CT cervical spine and maxillofacial negative for fracture. Patient did require intubation. Continuous EEG showed showing no definite seizure. Mild to moderate diffuse encephalopathy.. Neurosurgery Dr. JArnoldo Moralefollow-up with initial plan for ventriculostomy outpatient neurological status improved and ventriculostomy held. Latest cranial CT scan showed interval decrease in subarachnoid hemorrhage as compared to previous imaging with no hydrocephalus. Patient is  Currently maintained on Vimpat, Keppra and Dilantin. Tolerating a regular diet. Therapy evaluations completed with recommendations of physical medicine rehabilitation consult. Patient was admitted for a comprehensive rehabilitation program.  Review of Systems  Constitutional: Negative for fever.  HENT: Negative for hearing loss.   Eyes: Negative for blurred vision and double vision.  Respiratory: Negative for cough and shortness of breath.   Cardiovascular: Negative for chest pain, palpitations and leg swelling.  Gastrointestinal: Positive for constipation. Negative  for nausea and vomiting.  Genitourinary: Negative for dysuria, flank pain and hematuria.  Skin: Negative for rash.  All other systems reviewed and are negative.  No past medical history. No past surgical history No pertinent family history of seizures.  No family history on file. Social History:  Denies tobacco and drug history. +Etoh use. Allergies:  Allergies  Allergen Reactions  . Shellfish Allergy Other (See Comments)    Unknown per family and friend   No medications prior to admission.    Drug Regimen Review Drug regimen was reviewed and remains appropriate with no significant issues identified  Home: Home Living Family/patient expects to be discharged to:: Private residence Living Arrangements: Alone, Spouse/significant other(2-9:30 pm [place]) Available Help at Discharge: Family, Available PRN/intermittently Type of Home: Apartment Home Access: Stairs to enter ETechnical brewerof Steps: 1 Home Layout: One level Bathroom Shower/Tub: TChiropodist Standard Home Equipment: None Additional Comments: Information gathered from [place] significant other   Functional History: Prior Function Level of Independence: Independent 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, 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  Functional Status:  Mobility: Bed Mobility Overal bed mobility: Needs Assistance Bed Mobility: Supine to Sit Rolling: Mod assist Sidelying to sit: Mod assist, HOB elevated Supine to sit: Min assist General bed mobility comments: max directional v/c's , increased time, minA for trunk elevation Transfers Overall transfer level: Needs assistance Equipment used: 1 person hand held assist Transfers: Sit to/from Stand Sit to Stand: Min assist Stand pivot transfers: Mod assist General transfer comment: pt unsteady and fell back posteriorly on bed without assist for stability Ambulation/Gait Ambulation/Gait assistance: Mod assist Gait Distance (Feet): 200 Feet Assistive  device: 1 person hand held assist Gait Pattern/deviations: Step-through pattern, Decreased stride length, Staggering right General Gait Details: pt with consistent staggering to the R and R lateral lean, tactile cues at  hips to facilitate L LE weight shift, pt with narrow base of support Gait velocity: slow Gait velocity interpretation: <1.31 ft/sec, indicative of household ambulator    ADL: ADL Overall ADL's : Needs assistance/impaired Eating/Feeding: Moderate assistance Eating/Feeding Details (indicate cue type and reason): liquids only; coretrack Grooming: Moderate assistance, Standing, Cueing for safety, Cueing for sequencing Grooming Details (indicate cue type and reason): note undershooting when reaching for water; cues for sequencing (using soap); cues for thouroughness of activity Lower Body Dressing: Moderate assistance Lower Body Dressing Details (indicate cue type and reason): use of backward chaining to donn socks; difficulty with coordination noted; pulling up sock as if on foot but not over toes Toilet Transfer: Moderate assistance Toilet Transfer Details (indicate cue type and reason): Ambulated to toilet; Difficulty with turning and stepping back to toilet Toileting- Clothing Manipulation and Hygiene: Moderate assistance Functional mobility during ADLs: Moderate assistance General ADL Comments: Cues for sequencing when washing hands  Cognition: Cognition Overall Cognitive Status: Impaired/Different from baseline Arousal/Alertness: Awake/alert Orientation Level: Oriented to person, Disoriented to place, Disoriented to time, Disoriented to situation Attention: Focused Focused Attention: Impaired Focused Attention Impairment: Verbal basic Memory: Impaired Memory Impairment: Storage deficit Awareness: Impaired Awareness Impairment: Intellectual impairment Behaviors: Restless, Physical agitation, Poor frustration tolerance Safety/Judgment: Impaired [place] [employer]  Scales of Cognitive Functioning: Confused/appropriate Cognition Arousal/Alertness: Awake/alert Behavior During Therapy: Flat affect Overall Cognitive Status: Impaired/Different from baseline Area of Impairment: Orientation, Attention, Memory, Following commands, Safety/judgement, Awareness, Problem solving, [place] level Orientation Level: Disoriented to, Place(perseverated on [facility] despite re-orientation) Current Attention Level: Sustained Memory: Decreased recall of precautions, Decreased short-term memory Following Commands: Follows one step commands consistently Safety/Judgement: Decreased awareness of safety, Decreased awareness of deficits Awareness: Emergent Problem Solving: Slow processing, Difficulty sequencing, Requires verbal cues, Requires tactile cues General Comments: pt now reports "I had a seizure and fell at work", pt cont to have memory deficits but is improving in problem solving, with verbal cues pt able to navigate back to room despite not remembering room number  Physical Exam: Blood pressure 95/72, pulse (!) 101, temperature 98.8 F (37.1 C), temperature source Oral, resp. rate (!) 35, height _0  (1.549 m), weight 47 kg, last menstrual period 11/29/2017, SpO2 96 %. Physical Exam  Vitals reviewed. Constitutional: She appears well-developed and well-nourished.  HENT:  Head: Normocephalic and atraumatic.  Eyes: EOM are normal. Right eye exhibits no discharge. Left eye exhibits no discharge.  Pupils reactive to light  Neck: Normal range of motion. Neck supple. No thyromegaly present.  Cardiovascular: Normal rate and regular rhythm.  Respiratory: Effort normal and breath sounds normal. No respiratory distress.  GI: Soft. Bowel sounds are normal. She exhibits no distension.  Musculoskeletal:  No edema or tenderness in extremities  Neurological: She is alert.  She follows commands.  Provides her name and age.  She will initiate conversation. Motor:  Grossly 4+/5 throughout A&Ox2  Skin: Skin is warm and dry.  Psychiatric: Her affect is blunt. Her speech is delayed and tangential. She is slowed. Cognition and memory are impaired.    Results for orders placed or performed during the hospital encounter of 02/24/18 (from the past 48 hour(s))  Glucose, capillary     Status: Abnormal   Collection Time: 03/03/18  8:22 AM  Result Value Ref Range   Glucose-Capillary 130 (H) 70 - 99 mg/dL   Comment 1 Notify RN    Comment 2 Document in Chart   Glucose, capillary     Status: Abnormal  Collection Time: 03/03/18 12:06 PM  Result Value Ref Range   Glucose-Capillary 116 (H) 70 - 99 mg/dL   Comment 1 Notify RN    Comment 2 Document in Chart   Glucose, capillary     Status: Abnormal   Collection Time: 03/03/18  3:39 PM  Result Value Ref Range   Glucose-Capillary 160 (H) 70 - 99 mg/dL  Glucose, capillary     Status: None   Collection Time: 03/03/18  7:59 PM  Result Value Ref Range   Glucose-Capillary 97 70 - 99 mg/dL  Glucose, capillary     Status: None   Collection Time: 03/03/18 11:38 PM  Result Value Ref Range   Glucose-Capillary 97 70 - 99 mg/dL  Glucose, capillary     Status: None   Collection Time: 03/04/18  3:53 AM  Result Value Ref Range   Glucose-Capillary 79 70 - 99 mg/dL  Basic metabolic panel     Status: Abnormal   Collection Time: 03/04/18  4:25 AM  Result Value Ref Range   Sodium 136 135 - 145 mmol/L   Potassium 3.4 (L) 3.5 - 5.1 mmol/L   Chloride 105 98 - 111 mmol/L   CO2 21 (L) 22 - 32 mmol/L   Glucose, Bld 94 70 - 99 mg/dL   BUN <5 (L) 6 - 20 mg/dL   Creatinine, Ser 0.42 (L) 0.44 - 1.00 mg/dL   Calcium 8.9 8.9 - 10.3 mg/dL   GFR calc non Af Amer >60 >60 mL/min   GFR calc Af Amer >60 >60 mL/min    Comment: (NOTE) The eGFR has been calculated using the CKD EPI equation. This calculation has not been validated in all clinical situations. eGFR's persistently <60 mL/min signify possible Chronic Kidney Disease.     Anion gap 10 5 - 15    Comment: Performed at [facility] [address] [place], [place] [address]  Glucose, capillary     Status: None   Collection Time: 03/04/18  8:11 AM  Result Value Ref Range   Glucose-Capillary 85 70 - 99 mg/dL  Glucose, capillary     Status: Abnormal   Collection Time: 03/04/18 12:08 PM  Result Value Ref Range   Glucose-Capillary 130 (H) 70 - 99 mg/dL  Glucose, capillary     Status: Abnormal   Collection Time: 03/04/18  3:55 PM  Result Value Ref Range   Glucose-Capillary 102 (H) 70 - 99 mg/dL   Comment 1 Notify RN    Comment 2 Document in Chart   Glucose, capillary     Status: Abnormal   Collection Time: 03/04/18  7:41 PM  Result Value Ref Range   Glucose-Capillary 100 (H) 70 - 99 mg/dL  Glucose, capillary     Status: None   Collection Time: 03/04/18 11:40 PM  Result Value Ref Range   Glucose-Capillary 92 70 - 99 mg/dL  Glucose, capillary     Status: None   Collection Time: 03/05/18  3:38 AM  Result Value Ref Range   Glucose-Capillary 95 70 - 99 mg/dL  Glucose, capillary     Status: None   Collection Time: 03/05/18  7:42 AM  Result Value Ref Range   Glucose-Capillary 86 70 - 99 mg/dL   Comment 1 Notify RN    Comment 2 Document in Chart    Ct Head Wo Contrast  Result Date: 03/05/2018 CLINICAL DATA:  37 y/o F; traumatic brain injury with new onset seizure. EXAM: CT HEAD WITHOUT CONTRAST TECHNIQUE: Contiguous axial images  were obtained from the base of the skull through the vertex without intravenous contrast. COMPARISON:  02/25/2018 CT head. FINDINGS: Brain: Interval near complete resorption of brain parenchymal and subarachnoid hemorrhage. Small volume of residual blood products are present within the bilateral superior frontal gyri cortical contusions and there is a very small residual volume of subarachnoid hemorrhage over the right posterior frontal, right parietal, and left occipital lobes. No new intracranial hemorrhage, stroke, or mass  effect identified. No hydrocephalus or herniation. Vascular: No hyperdense vessel or unexpected calcification. Skull: Near complete resolution of right frontal scalp contusion. No displaced calvarial fracture Sinuses/Orbits: No acute finding. Other: None. IMPRESSION: 1. Interval near complete resorption of brain parenchymal and subarachnoid hemorrhage with small volume residual. 2. No new intracranial hemorrhage, stroke, or mass effect. 3. Near complete resolution of right frontal scalp contusion. 4. No displaced calvarial fracture. Electronically Signed   By: Kristine Garbe M.D.   On: 03/05/2018 05:28       Medical Problem List and Plan: 1.   Decreased functional mobility and mental status secondary to seizure and traumatic SAH/TBI 2.  DVT Prophylaxis/Anticoagulation: SCDs. Monitor for any signs of DVT 3. Pain Management:  Tylenol as needed 4. Mood:  Provide emotional support 5. Neuropsych: This patient is capable of making decisions on her own behalf. 6. Skin/Wound Care:  Routine skin checks 7. Fluids/Electrolytes/Nutrition:  Routine ins and outs with follow-up chemistries 8. Seizure disorder. Dilantin 150 mg 3 times a day, Keppra 1500 mg twice a day, Vimpat 200 mg twice daily 9. Alcohol use. Alcohol level of 11 on discharge. Provide counseling   Post Admission Physician Evaluation: 1. Preadmission assessment reviewed and changes made below. 2. Functional deficits secondary  to seizures and traumatic SAH/TBI. 3. Patient is admitted to receive collaborative, interdisciplinary care between the physiatrist, rehab nursing staff, and therapy team. 4. Patient's level of medical complexity and substantial therapy needs in context of that medical necessity cannot be provided at a lesser intensity of care such as a SNF. 5. Patient has experienced substantial functional loss from his/her baseline which was documented above under the "Functional History" and "Functional Status" headings.   Judging by the patient's diagnosis, physical exam, and functional history, the patient has potential for functional progress which will result in measurable gains while on inpatient rehab.  These gains will be of substantial and practical use upon discharge  in facilitating mobility and self-care at the household level. 6. Physiatrist will provide 24 hour management of medical needs as well as oversight of the therapy plan/treatment and provide guidance as appropriate regarding the interaction of the two. 7. 24 hour rehab nursing will assist with bladder management, safety, skin/wound care, disease management, medication administration, pain management and patient education  and help integrate therapy concepts, techniques,education, etc. 8. PT will assess and treat for/with: Lower extremity strength, range of motion, stamina, balance, functional mobility, safety, adaptive techniques and equipment, wound care, coping skills, pain control, education. Goals are: Supervision. 9. OT will assess and treat for/with: ADL's, functional mobility, safety, upper extremity strength, adaptive techniques and equipment, wound mgt, ego support, and community reintegration.   Goals are: Supervision. Therapy may not proceed with showering this patient. 10. SLP will assess and treat for/with: speech, language, cognition.  Goals are: Supervision. 11. Case Management and Social Worker will assess and treat for psychological issues and discharge planning. 12. Team conference will be held weekly to assess progress toward goals and to determine barriers to discharge. 13. Patient will receive at  least 3 hours of therapy per day at least 5 days per week. 14. ELOS: 6-10 days.       15. Prognosis:  good  I have personally performed a face to face diagnostic evaluation, including, but not limited to relevant history and physical exam findings, of this patient and developed relevant assessment and plan.  Additionally, I have reviewed  and concur with the physician assistant's documentation above.  The patient's status has not changed. The original post admission physician evaluation remains appropriate, and any changes from the pre-admission screening or documentation from the acute chart are noted above.   Delice Lesch, MD, ABPMR Lavon Paganini Angiulli, PA-C 03/05/2018

## 2018-03-05 NOTE — Progress Notes (Signed)
Patient is awake and alert and oriented.  Stable otherwise and can go to Rehab. Alyssa LamasJames O. Gae BonWyatt, III, MD, FACS [phone] Trauma Surgeon

## 2018-03-05 NOTE — Progress Notes (Signed)
Pt transferred to 4W-05.

## 2018-03-05 NOTE — Progress Notes (Signed)
MEDICATION RELATED CONSULT NOTE - Follow up  Pharmacy Consult for phenytoin Indication: seizures  Labs: Recent Labs    03/03/18 0420 03/04/18 0425 03/05/18 0946  WBC  --   --  8.3  HGB  --   --  11.4*  HCT  --   --  34.7*  PLT  --   --  593*  CREATININE <0.30* 0.42* 0.45  MG 2.1  --   --   PHOS 2.9  --   --   ALBUMIN  --   --  2.5*  PROT  --   --  5.8*  AST  --   --  106*  ALT  --   --  69*  ALKPHOS  --   --  92  BILITOT  --   --  0.6   Estimated Creatinine Clearance: 70.7 mL/min (by C-G formula based on SCr of 0.45 mg/dL).  Assessment: 37 yo f presenting with seizures. Now on keppra, vimpat, and phenytoin.  Extubated now, receiving meds/TF through [facility]  11/20: Phenytoin level this morning <2.5, with albumin low at 2.5 as well. Will increase dose and check another level in 5-7 days  Goal of Therapy:  Phenytoin 10 - 20 (corrected)  Plan:  Increase phenytoin to 150 mg q8h F/u repeat level in 5-7 days  Thank you for involving pharmacy in this patient's care.  Wendelyn Breslowylan Hanna, PharmD PGY1 Pharmacy Resident Phone: [phone] 03/05/2018 11:16 AM

## 2018-03-06 ENCOUNTER — Inpatient Hospital Stay (HOSPITAL_COMMUNITY): Payer: Managed Care, Other (non HMO)

## 2018-03-06 ENCOUNTER — Inpatient Hospital Stay (HOSPITAL_COMMUNITY): Payer: Managed Care, Other (non HMO) | Admitting: Physical Therapy

## 2018-03-06 ENCOUNTER — Inpatient Hospital Stay (HOSPITAL_COMMUNITY): Payer: Managed Care, Other (non HMO) | Admitting: Speech Pathology

## 2018-03-06 DIAGNOSIS — S069X3S Unspecified intracranial injury with loss of consciousness of 1 hour to 5 hours 59 minutes, sequela: Secondary | ICD-10-CM

## 2018-03-06 DIAGNOSIS — G40909 Epilepsy, unspecified, not intractable, without status epilepticus: Secondary | ICD-10-CM

## 2018-03-06 LAB — COMPREHENSIVE METABOLIC PANEL
ALT: 65 U/L — ABNORMAL HIGH (ref 0–44)
ANION GAP: 9 (ref 5–15)
AST: 74 U/L — ABNORMAL HIGH (ref 15–41)
Albumin: 2.8 g/dL — ABNORMAL LOW (ref 3.5–5.0)
Alkaline Phosphatase: 101 U/L (ref 38–126)
BUN: 5 mg/dL — ABNORMAL LOW (ref 6–20)
CHLORIDE: 102 mmol/L (ref 98–111)
CO2: 24 mmol/L (ref 22–32)
Calcium: 9.4 mg/dL (ref 8.9–10.3)
Creatinine, Ser: 0.47 mg/dL (ref 0.44–1.00)
GFR calc non Af Amer: >60 mL/min (ref >60)
Glucose, Bld: 96 mg/dL (ref 70–99)
Potassium: 3.5 mmol/L (ref 3.5–5.1)
SODIUM: 135 mmol/L (ref 135–145)
Total Bilirubin: 0.7 mg/dL (ref 0.3–1.2)
Total Protein: 6.1 g/dL — ABNORMAL LOW (ref 6.5–8.1)

## 2018-03-06 LAB — CBC WITH DIFFERENTIAL/PLATELET
Abs Immature Granulocytes: 0.03 10*3/uL (ref 0.00–0.07)
BASOS PCT: 3 %
Basophils Absolute: 0.3 10*3/uL — ABNORMAL HIGH (ref 0.0–0.1)
EOS ABS: 0.5 10*3/uL (ref 0.0–0.5)
EOS PCT: 6 %
HCT: 35.3 % — ABNORMAL LOW (ref 36.0–46.0)
Hemoglobin: 11.5 g/dL — ABNORMAL LOW (ref 12.0–15.0)
Immature Granulocytes: 0 %
Lymphocytes Relative: 17 %
Lymphs Abs: 1.4 10*3/uL (ref 0.7–4.0)
MCH: 32.4 pg (ref 26.0–34.0)
MCHC: 32.6 g/dL (ref 30.0–36.0)
MCV: 99.4 fL (ref 80.0–100.0)
Monocytes Absolute: 1.9 10*3/uL — ABNORMAL HIGH (ref 0.1–1.0)
Monocytes Relative: 23 %
Neutro Abs: 4.3 10*3/uL (ref 1.7–7.7)
Neutrophils Relative %: 51 %
PLATELETS: 695 10*3/uL — AB (ref 150–400)
RBC: 3.55 MIL/uL — AB (ref 3.87–5.11)
RDW: 18.5 % — AB (ref 11.5–15.5)
WBC: 8.4 10*3/uL (ref 4.0–10.5)
nRBC: 0 % (ref 0.0–0.2)

## 2018-03-06 NOTE — Progress Notes (Signed)
Inpatient Rehabilitation  Patient information reviewed and entered into eRehab system by Emari Demmer M. Shantana Christon, M.A., CCC/SLP, PPS Coordinator.  Information including medical coding, functional ability and quality indicators will be reviewed and updated through discharge.    

## 2018-03-06 NOTE — Progress Notes (Signed)
[facility] PHYSICAL MEDICINE & REHABILITATION PROGRESS NOTE   Subjective/Complaints: States she had a reasonable night. Was able to sleep. Eating breakfast when I came in. "partner" in room.  ROS: Patient denies fever, rash, sore throat, blurred vision, nausea, vomiting, diarrhea, cough, shortness of breath or chest pain, joint or back pain, headache, or mood change.    Objective:   Ct Head Wo Contrast  Result Date: 03/05/2018 CLINICAL DATA:  37 y/o F; traumatic brain injury with new onset seizure. EXAM: CT HEAD WITHOUT CONTRAST TECHNIQUE: Contiguous axial images were obtained from the base of the skull through the vertex without intravenous contrast. COMPARISON:  02/25/2018 CT head. FINDINGS: Brain: Interval near complete resorption of brain parenchymal and subarachnoid hemorrhage. Small volume of residual blood products are present within the bilateral superior frontal gyri cortical contusions and there is a very small residual volume of subarachnoid hemorrhage over the right posterior frontal, right parietal, and left occipital lobes. No new intracranial hemorrhage, stroke, or mass effect identified. No hydrocephalus or herniation. Vascular: No hyperdense vessel or unexpected calcification. Skull: Near complete resolution of right frontal scalp contusion. No displaced calvarial fracture Sinuses/Orbits: No acute finding. Other: None. IMPRESSION: 1. Interval near complete resorption of brain parenchymal and subarachnoid hemorrhage with small volume residual. 2. No new intracranial hemorrhage, stroke, or mass effect. 3. Near complete resolution of right frontal scalp contusion. 4. No displaced calvarial fracture. Electronically Signed   By: Mitzi Hansen M.D.   On: 03/05/2018 05:28   Recent Labs    03/05/18 0946 03/06/18 0427  WBC 8.3 8.4  HGB 11.4* 11.5*  HCT 34.7* 35.3*  PLT 593* 695*   Recent Labs    03/05/18 0946 03/06/18 0427  NA 135 135  K 3.5 3.5  CL 103 102  CO2 23  24  GLUCOSE 100* 96  BUN <5* 5*  CREATININE 0.45 0.47  CALCIUM 9.1 9.4   No intake or output data in the 24 hours ending 03/06/18 0842   Physical Exam: Vital Signs Blood pressure 102/72, pulse (!) 115, temperature 99.6 F (37.6 C), temperature source Oral, resp. rate 20, height 5\' 2"  (1.575 m), weight 45.9 kg, SpO2 95 %. Constitutional: No distress . Vital signs reviewed. HEENT: EOMI, oral membranes moist Neck: supple Cardiovascular: RRR without murmur. No JVD    Respiratory: CTA Bilaterally without wheezes or rales. Normal effort    GI: BS +, non-tender, non-distended  Musculoskeletal:  No edema or tenderness in extremities  Neurological: Oriented to hospital, name. Could not tell me date or city she was in. Marland Kitchen  She will initiate conversation. Tells me she lives in [place], [place].  Motor: Grossly 4+/5 throughout  Skin: Skin is warm and dry.  Psychiatric: blunt, distracted.     Assessment/Plan: 1. Functional deficits secondary to seizures/TBI which require 3+ hours per day of interdisciplinary therapy in a comprehensive inpatient rehab setting.  Physiatrist is providing close team supervision and 24 hour management of active medical problems listed below.  Physiatrist and rehab team continue to assess barriers to discharge/monitor patient progress toward functional and medical goals  Care Tool:  Bathing              Bathing assist       Upper Body Dressing/Undressing Upper body dressing   What is the patient wearing?: (P) Hospital gown only    Upper body assist      Lower Body Dressing/Undressing Lower body dressing      What is the patient wearing?: (  P) Hospital gown only     Lower body assist       Toileting Toileting    Toileting assist       Transfers Chair/bed transfer  Transfers assist     Chair/bed transfer assist level: (P) Contact Guard/Touching assist     Locomotion Ambulation   Ambulation assist      Assist level:  Minimal Assistance - Patient > 75% Assistive device: Hand held assist     Walk 10 feet activity   Assist           Walk 50 feet activity   Assist           Walk 150 feet activity   Assist           Walk 10 feet on uneven surface  activity   Assist           Wheelchair     Assist               Wheelchair 50 feet with 2 turns activity    Assist            Wheelchair 150 feet activity     Assist           Medical Problem List and Plan: 1.   Decreased functional mobility and mental status secondary to seizure and traumatic SAH/TBI  -beginning therapies today  -enclosure bed needed?  2.  DVT Prophylaxis/Anticoagulation: SCDs. Monitor for any signs of DVT 3. Pain Management:  Tylenol as needed 4. Mood:  Provide emotional support, environmental mods, education 5. Neuropsych:  Provide support as available.  6. Skin/Wound Care:  Routine skin checks 7. Fluids/Electrolytes/Nutrition: encourage PO  -I personally reviewed the patient's labs today.    -albumin trending up, protein supp 8. Seizure disorder. Dilantin 150 mg 3 times a day, Keppra 1500 mg twice a day, Vimpat 200 mg twice daily  -no seizure activity 9. Alcohol use. Alcohol level of 11 on discharge. Provide counseling when appropriate 10. ABLA---no supplement, dietary iron    LOS: 1 days A FACE TO FACE EVALUATION WAS PERFORMED  Ranelle OysterZachary T  03/06/2018, 8:42 AM

## 2018-03-06 NOTE — Discharge Summary (Signed)
Patient ID: Alyssa LacyChristina Danielle Little [id number] 06/20/1980 37 y.o.  Admit date: 02/24/2018 Discharge date: 03/05/2018  Admitting Diagnosis: Fall TBI/SAH Seizure ETOH abuse  Discharge Diagnosis Patient Active Problem List   Diagnosis Date Noted  . TBI (traumatic brain injury) (HCC) 03/05/2018  . ETOH abuse   . Tachypnea   . Traumatic brain injury with loss of consciousness (HCC)   . Sinus tachycardia   . Seizures (HCC)   . Acute blood loss anemia   . Malnutrition of moderate degree 02/26/2018  . SAH (subarachnoid hemorrhage) (HCC) 02/24/2018    Consultants Dr. Ritta SlotMcNeill Kirkpatrick, neurology Dr. Tressie StalkerJeffrey Jenkins, neurosurgery  Reason for Admission: 37 yo wf brought in as level 1 trauma from work. She reportedly fell with head trauma and seizures. Unclear which happened first. Found in break room convulsing with bleeding scalp wound. She received versed en route by EMS for sz.  In ED she received ativan for ongoing sz activity. EDP reported pt had lateral gaze. Intubated for low GCS. Blood sugar ok. For most of trauma workup pt was tachycardic at times up to 175 and HTN. She also recvd keppra in ED.  Procedures None  Hospital Course:  The patient was admitted and intubated in the ED for airway protection given her seizure activity and head injury.  Neurology and NS were consulted.  She was placed on continuous EEG monitoring for several days. No further seizure activity was noted and this was subsequently discontinued, but she was maintained on several different anti-seizure medications.  NS followed her for her SAH.  Her follow up CT of her head the day after admission revealed decrease in volume of hemorrhage.  She was stable and able to be extubated.  TBI therapy was initiated.  Initially she was too lethargic to participate and even take in nutrition.  A Cortrak was placed.  After several days of therapy, she began to improve.  Ultimately her Cortrak was removed and her  diet was advanced as tolerated.  CIR evaluated the patient and felt as if she was appropriate for further rehab.  She also at this time prior to discharge had another follow up CT scan, about a week after her last one.  This revealed near complete resorption of brain parenchymal and SAH with only minimal residual.  She was felt stable at this time for DC to CIR.  Of note, she was on CIWA protocol while here due to a history of drinking 2 "cups" of brandy a day.     Physical Exam: See progress note from day of discharge  Allergies as of 03/06/2018      Reactions   Shellfish Allergy Other (See Comments)   Unknown per family and friend    Inpatient medications will be continued in inpatient rehab.        Follow-up Information    Ranelle OysterSwartz, Zachary T, MD Follow up.   Specialty:  Physical Medicine and Rehabilitation Why:  office to call for appointment Contact information: [address] [place] [address] [phone]        Tressie StalkerJenkins, Jeffrey, MD Follow up.   Specialty:  Neurosurgery Why:  call for appointment as needed Contact information: [address] [place] [address] [phone]        Jimmye NormanWyatt, James, MD Follow up.   Specialty:  General Surgery Why:  call for appointment Contact information: [address] [place] [address] [phone]  Signed: Barnetta Chapel, [facility] 03/06/2018, 1:00 PM Pager: [phone]

## 2018-03-06 NOTE — Progress Notes (Signed)
Patient remains in enclosure bed and reassessed q 2hr; request prn pain meds X 1; relief voiced. Significant other at bedside. Will continue to monitor

## 2018-03-06 NOTE — Evaluation (Addendum)
Speech Language Pathology Assessment and Plan  Patient Details  Name: Alyssa Little MRN: [id number] Date of Birth: 25-Jul-1980  SLP Diagnosis: Cognitive Impairments  Rehab Potential: Excellent ELOS: 5 to 7 days    Today's Date: 03/06/2018 SLP Individual Time: 4098-1191 SLP Individual Time Calculation (min): 60 min   Problem List:  Patient Active Problem List   Diagnosis Date Noted  . TBI (traumatic brain injury) ([place]) 03/05/2018  . ETOH abuse   . Tachypnea   . Traumatic brain injury with loss of consciousness ([place])   . Sinus tachycardia   . Seizures ([place])   . Acute blood loss anemia   . Malnutrition of moderate degree 02/26/2018  . SAH (subarachnoid hemorrhage) ([place]) 02/24/2018   Past Medical History: History reviewed. No pertinent past medical history. Past Surgical History: History reviewed. No pertinent surgical history.  Assessment / Plan / Recommendation Clinical Impression Alyssa Little is a 37 year old right-handed female with unremarkable past medical history except for reported alcohol use. Pt has boyfriend but lives alone in one level home with 1 step entry. Independent prior to admission working full time. Presented 02/24/2018 after being found down in a break room at work convulsing with bleeding from scalp. UDS negative and alcohol 27f11. Cranial CT scan reviewed, showing b/l SAH.  Per report, small to moderate volume traumatic subarachnoid hemorrhage along both sylvian fissures and the cerebral convexities, more so on the right. Large right anterior convexity scalp hematoma but no underlying skull fracture identified. CT cervical spine and maxillofacial negative for fracture. Patient did require intubation. Continuous EEG showed showing no definite seizure. Mild to moderate diffuse encephalopathy. Neurosurgery Dr. JArnoldo Moralefollow-up with initial plan for ventriculostomy outpatient neurological status improved and ventriculostomy held. Latest cranial CT  scan showed interval decrease in subarachnoid hemorrhage as compared to previous imaging with no hydrocephalus. Patient is currently maintained on Vimpat, Keppra and Dilantin. Tolerating a regular diet. Therapy evaluations completed with recommendations of physical medicine rehabilitation consult.   Patient was admitted for a comprehensive rehabilitation program on 03/05/18. Comprehensive cognitive linguistic and bedside swallow evaluations completed on 03/06/18. Pt's boyfriend "PLaurey Arrow at bedside. Pt with continued improved mental status as evidenced by increased selective attention, orientation (to self, place, general time and situation). Pt demonstrates increased insight into current deficits (emergent). She is verbally conversant with intermittent off-topic/tangential responses but good organization of thought when communicating current job responsibilities. Pt required more than a reasonable amount of time to complete semi-complex problem solving tasks with increased difficulty noted when applying inferred information. When reading pt substitutes semantically similar words that don't detract from meaning. Short term memory is also impacted with increased emergent awareness of difficulty. Pt continues to consume regular diet textures with thin liquids without overt s/s of aspiration. ST signed off of dysphagia therapy. Education provided on TBI recovery, ST goals targeting cognition and POC. Anticipate that pt may benefit from increased supervision at discharge and possibly Outpatient ST services to target high level executive/cognitive functions. Will continue assess progress.    Skilled Therapeutic Interventions          Skilled treatment session focused on completion of BSE and cognitive linguistic evaluations, see above. SLP introduced memory book and calendar to increase independence with recall of information. Pt able to complete semi-complex problem solving tasks with calendar with more than a  reasonable amount of time. Pt appropriately emotional with change that has occurred as result of TBI, support given but recommend neuropsych evaluation.    SLP Assessment  Patient will need skilled Speech Lanaguage Pathology Services during CIR admission    Recommendations  SLP Diet Recommendations: Age appropriate regular solids;Thin Liquid Administration via: Cup;Straw Medication Administration: Whole meds with liquid Supervision: Patient able to self feed Compensations: Minimize environmental distractions;Slow rate;Small sips/bites Postural Changes and/or Swallow Maneuvers: Seated upright 90 degrees Oral Care Recommendations: Oral care BID Recommendations for Other Services: Neuropsych consult(d/t history of alcohol abuse) Patient destination: Home Follow up Recommendations: Outpatient SLP Equipment Recommended: None recommended by SLP    SLP Frequency 3 to 5 out of 7 days   SLP Duration  SLP Intensity  SLP Treatment/Interventions 5 to 7 days  Minumum of 1-2 x/day, 30 to 90 minutes  Cognitive remediation/compensation;Internal/external aids;Medication managment;Patient/family education;Functional tasks    Pain Pain Assessment Pain Scale: 0-10 Pain Score: 0-No pain  Prior Functioning Cognitive/Linguistic Baseline: Within functional limits Type of Home: Apartment  Lives With: Alone Available Help at Discharge: Family;Available PRN/intermittently Education: Some college Vocation: Full time employment  Short Term Goals: Week 1: SLP Short Term Goal 1 (Week 1): Pt will complete complex problem solving tasks with Min A cues.  SLP Short Term Goal 2 (Week 1): Pt will demonstrate selective attention in moderately distracting environment for ~ 30 minutes with supervision cues.  SLP Short Term Goal 3 (Week 1): With supervision questions, pt will demonstrate anticipatory awareness by identifying 3 safe vs. unsafe activities within home environment.  SLP Short Term Goal 4 (Week 1):  Pt will utilize external memory aids to recall new daily information with supervision cues.   Refer to Care Plan for Long Term Goals  Recommendations for other services: Neuropsych  Discharge Criteria: Patient will be discharged from SLP if patient refuses treatment 3 consecutive times without medical reason, if treatment goals not met, if there is a change in medical status, if patient makes no progress towards goals or if patient is discharged from hospital.  The above assessment, treatment plan, treatment alternatives and goals were discussed and mutually agreed upon: by patient and by family  Royal Beirne 03/06/2018, 11:54 AM

## 2018-03-06 NOTE — Evaluation (Signed)
Physical Therapy Assessment and Plan  Patient Details  Name: Alyssa Little MRN: [id number] Date of Birth: [birth year]  PT Diagnosis: Cognitive deficits, Difficulty walking and Muscle weakness Rehab Potential: Good ELOS: 5-7 days   Today's Date: 03/06/2018 PT Individual Time: 1400-1510 PT Individual Time Calculation (min): 70 min    Problem List:  Patient Active Problem List   Diagnosis Date Noted  . TBI (traumatic brain injury) (Alyssa Little) 03/05/2018  . ETOH abuse   . Tachypnea   . Traumatic brain injury with loss of consciousness (Alyssa Little)   . Sinus tachycardia   . Seizures (Alyssa Little)   . Acute blood loss anemia   . Malnutrition of moderate degree 02/26/2018  . SAH (subarachnoid hemorrhage) (Alyssa Little) 02/24/2018    Past Medical History: History reviewed. No pertinent past medical history. Past Surgical History: History reviewed. No pertinent surgical history.  Assessment & Plan Clinical Impression: Patient is a 37 y.o. year old female with recent admission to the hospital on 02/24/2018 after being found down in a break room at work convulsing with bleeding from scalp. UDS negative and alcohol 18f11. Cranial CT scan reviewed, showing b/l SAH.  Per report, small to moderate volume traumatic subarachnoid hemorrhage along both sylvian fissures and the cerebral convexities, more so on the right. Large right anterior convexity scalp hematoma but no underlying skull fracture identified. CT cervical spine and maxillofacial negative for fracture. Patient did require intubation. Continuous EEG showed showing no definite seizure. Mild to moderate diffuse encephalopathy.. Neurosurgery Dr. JArnoldo Little with initial plan for ventriculostomy outpatient neurological status improved and ventriculostomy held. Latest cranial CT scan showed interval decrease in subarachnoid hemorrhage as compared to previous imaging with no hydrocephalus.  Patient transferred to CIR on 03/05/2018 .   Patient currently  requires min with mobility secondary to muscle weakness, decreased attention, decreased awareness, decreased problem solving and decreased safety awareness and decreased standing balance, decreased postural control and decreased balance strategies.  Prior to hospitalization, patient was independent  with mobility and [place]  Home access is 1Stairs to enter.  Patient will benefit from skilled PT intervention to maximize safe functional mobility, minimize fall risk and decrease caregiver burden for planned discharge home with 24 hour supervision.  Anticipate patient will benefit from follow up OP at discharge.  PT - End of Session Activity Tolerance: Tolerates 30+ min activity with multiple rests Endurance Deficit: Yes Endurance Deficit Description: generalized deconditioning PT Assessment Rehab Potential (ACUTE/IP ONLY): Good PT Barriers to Discharge: Decreased caregiver support PT Patient demonstrates impairments in the following area(s): Balance;Endurance;Motor;Safety PT Transfers Functional Problem(s): Bed Mobility;Bed to Chair;Car;Furniture;Floor PT Locomotion Functional Problem(s): Stairs;Ambulation PT Plan PT Intensity: Minimum of 1-2 x/day ,45 to 90 minutes PT Frequency: 5 out of 7 days PT Duration Estimated Length of Stay: 5-7 days PT Treatment/Interventions: Ambulation/gait training;Discharge planning;Functional mobility training;Therapeutic Activities;Balance/vestibular training;Neuromuscular re-education;Therapeutic Exercise;Wheelchair propulsion/positioning;Cognitive remediation/compensation;DME/adaptive equipment instruction;Pain management;Splinting/orthotics;UE/LE Strength taining/ROM;Community reintegration;Patient/family education;Stair training;UE/LE Coordination activities PT Transfers Anticipated Outcome(s): supervision PT Locomotion Anticipated Outcome(s): supervision PT Recommendation Recommendations for Other Services: Neuropsych  consult Follow Up Recommendations: Outpatient PT Patient destination: Home Equipment Recommended: None recommended by PT  Skilled Therapeutic Intervention Pt participated in skilled PT eval and was educated on PT POC and goals.  Pt performs gait throughout unit initially with min A, progressing to supervision throughout treatment.  Dynamic gait training with ball toss and ball bounce with min A.  Pt with c/o back pain during session after prolonged standing, pt educated on and  performed back strengthening exercises with theraband.  Pt completed Berg balance test and DGI with moderate fall risk, pt educated on risk for falls and pt verbalizes understanding.  Pt with decreased awareness of deficits and their impact on d/c home and POC, pt educated throughout session.  Pt left in room in veil bed with needs at hand.  PT Evaluation Precautions/Restrictions Precautions Precautions: Fall Restrictions Weight Bearing Restrictions: No Pain Pain Assessment Pain Scale: 0-10 Pain Score: 0-No pain Faces Pain Scale: No hurt Home Living/Prior Functioning Home Living Available Help at Discharge: Family;Available PRN/intermittently Type of Home: Apartment Home Access: Stairs to enter Entrance Stairs-Number of Steps: 1 Entrance Stairs-Rails: Left Home Layout: One level Bathroom Shower/Tub: Chiropodist: Standard  Lives With: Alone Prior Function Level of Independence: Independent with basic ADLs;Independent with gait;Independent with transfers  Able to Take Stairs?: Yes Driving: Yes Vocation: Full time employment 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, 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  Cognition Overall Cognitive Status: Impaired/Different from baseline Arousal/Alertness: Awake/alert Orientation Level: Oriented to person;Oriented to place Attention: Selective Focused Attention: Appears intact Focused Attention Impairment: Verbal basic Selective Attention: Impaired Selective Attention Impairment:  Functional complex Memory: Impaired Memory Impairment: Decreased short term memory;Decreased recall of new information;Storage deficit Decreased Short Term Memory: Functional complex;Verbal complex Awareness: Impaired Awareness Impairment: Anticipatory impairment Problem Solving: Impaired Problem Solving Impairment: Verbal complex;Functional complex Executive Function: Reasoning Reasoning: Impaired Reasoning Impairment: Functional complex;Verbal complex Self Monitoring: Impaired Safety/Judgment: Impaired Comments: Decreased anticipatory awareness [employer] Scales of Cognitive Functioning: Automatic/appropriate Sensation Sensation Light Touch: Appears Intact Hot/Cold: Appears Intact Proprioception: Appears Intact Stereognosis: Appears Intact Coordination Gross Motor Movements are Fluid and Coordinated: Yes Fine Motor Movements are Fluid and Coordinated: Yes Finger Nose Finger Test: [facility] 9 Hole Peg Test: R: 22 sec, L: 30 sec Motor  Motor Motor: Other (comment) Motor - Skilled Clinical Observations: generalized weakness  Mobility Bed Mobility Bed Mobility: Rolling Right;Rolling Left Rolling Right: Independent Rolling Left: Independent Transfers Transfers: Stand to Sit;Sit to Stand;Stand Pivot Transfers Sit to Stand: Contact Guard/Touching assist Stand to Sit: Contact Guard/Touching assist Stand Pivot Transfers: Contact Guard/Touching assist Stand Pivot Transfer Details: Verbal cues for precautions/safety Transfer (Assistive device): None Locomotion  Gait Ambulation: Yes Gait Assistance: Minimal Assistance - Patient > 75% Gait Distance (Feet): 150 Feet Assistive device: None Stairs / Additional Locomotion Stairs: Yes Stairs Assistance: Minimal Assistance - Patient > 75% Stair Management Technique: Two rails Number of Stairs: 12 Ramp: Minimal Assistance - Patient >75% Wheelchair Mobility Wheelchair Mobility: No  Trunk/Postural Assessment  Cervical  Assessment Cervical Assessment: Within Functional Limits Thoracic Assessment Thoracic Assessment: Within Functional Limits Lumbar Assessment Lumbar Assessment: Within Functional Limits Postural Control Postural Control: Deficits on evaluation Righting Reactions: delayed  Balance Balance Balance Assessed: Yes Standardized Balance Assessment Standardized Balance Assessment: Berg Balance Test;Dynamic Gait Index Berg Balance Test Sit to Stand: Able to stand without using hands and stabilize independently Standing Unsupported: Able to stand 2 minutes with supervision Sitting with Back Unsupported but Feet Supported on Floor or Stool: Able to sit safely and securely 2 minutes Stand to Sit: Sits safely with minimal use of hands Transfers: Able to transfer safely, minor use of hands Standing Unsupported with Eyes Closed: Able to stand 10 seconds with supervision Standing Ubsupported with Feet Together: Able to place feet together independently and stand for 1 minute with supervision From Standing, Reach Forward with Outstretched Arm: Can reach forward >12 cm safely (5") From Standing Position, Pick up Object from Floor: Able to pick  up shoe safely and easily From Standing Position, Turn to Look Behind Over each Shoulder: Looks behind from both sides and weight shifts well Turn 360 Degrees: Needs close supervision or verbal cueing Standing Unsupported, Alternately Place Feet on Step/Stool: Able to complete >2 steps/needs minimal assist Standing Unsupported, One Foot in Front: Needs help to step but can hold 15 seconds Standing on One Leg: Tries to lift leg/unable to hold 3 seconds but remains standing independently Total Score: 40 Dynamic Gait Index Level Surface: Mild Impairment Change in Gait Speed: Mild Impairment Gait with Horizontal Head Turns: Mild Impairment Gait with Vertical Head Turns: Mild Impairment Gait and Pivot Turn: Moderate Impairment Step Over Obstacle: Moderate  Impairment Step Around Obstacles: Moderate Impairment Steps: Mild Impairment Total Score: 13 Static Sitting Balance Static Sitting - Balance Support: Feet supported Static Sitting - Level of Assistance: 6: Modified independent (Device/Increase time) Dynamic Sitting Balance Dynamic Sitting - Balance Support: Feet supported Dynamic Sitting - Level of Assistance: 5: Stand by assistance Static Standing Balance Static Standing - Balance Support: During functional activity Static Standing - Level of Assistance: 5: Stand by assistance Dynamic Standing Balance Dynamic Standing - Balance Support: During functional activity Dynamic Standing - Level of Assistance: 4: Min assist Extremity Assessment  RUE Assessment General Strength Comments: R grip strength: 40 lbs LUE Assessment General Strength Comments: L grip strength: 31 lbs RLE Assessment General Strength Comments: grossly 3/5 LLE Assessment General Strength Comments: grossly 3/5    Refer to Care Plan for Long Term Goals  Recommendations for other services: Neuropsych  Discharge Criteria: Patient will be discharged from PT if patient refuses treatment 3 consecutive times without medical reason, if treatment goals not met, if there is a change in medical status, if patient makes no progress towards goals or if patient is discharged from hospital.  The above assessment, treatment plan, treatment alternatives and goals were discussed and mutually agreed upon: by patient  [employer] 03/06/2018, 3:16 PM

## 2018-03-06 NOTE — Evaluation (Signed)
Occupational Therapy Assessment and Plan  Patient Details  Name: Alyssa Little MRN: [id number] Date of Birth: May 24, 1980  OT Diagnosis: cognitive deficits and muscle weakness (generalized) Rehab Potential: Rehab Potential (ACUTE ONLY): Excellent ELOS: 5-7 days   Today's Date: 03/06/2018 OT Individual Time: 4034-7425 OT Individual Time Calculation (min): 85 min     Problem List:  Patient Active Problem List   Diagnosis Date Noted  . TBI (traumatic brain injury) ([place]) 03/05/2018  . ETOH abuse   . Tachypnea   . Traumatic brain injury with loss of consciousness ([place])   . Sinus tachycardia   . Seizures ([place])   . Acute blood loss anemia   . Malnutrition of moderate degree 02/26/2018  . SAH (subarachnoid hemorrhage) ([place]) 02/24/2018    Past Medical History: History reviewed. No pertinent past medical history. Past Surgical History: History reviewed. No pertinent surgical history.  Assessment & Plan Clinical Impression: [name] is a 37 year old right-handed female with unremarkable past medical history except for reported alcohol use. History taken from chart review. Patient lives alone. She has a boyfriend. One level home one step to entry. Independent prior to admission working full time. Boyfriend is available except works from 2 PM to 9:30 PM.Her aunt and grandparents are planning to assist as needed. Presented 02/24/2018 after being found down in a break room at work convulsing with bleeding from scalp. UDS negative and alcohol 18f11. Cranial CT scan reviewed, showing b/l SAH.  Per report, small to moderate volume traumatic subarachnoid hemorrhage along both sylvian fissures and the cerebral convexities, more so on the right. Large right anterior convexity scalp hematoma but no underlying skull fracture identified. CT cervical spine and maxillofacial negative for fracture. Patient did require intubation. Continuous EEG showed showing no definite seizure. Mild to  moderate diffuse encephalopathy.. Neurosurgery Dr. JArnoldo Moralefollow-up with initial plan for ventriculostomy outpatient neurological status improved and ventriculostomy held. Latest cranial CT scan showed interval decrease in subarachnoid hemorrhage as compared to previous imaging with no hydrocephalus. Patient is  Currently maintained on Vimpat, Keppra and Dilantin. Tolerating a regular diet. Therapy evaluations completed with recommendations of physical medicine rehabilitation consult. Patient was admitted for a comprehensive rehabilitation program. Patient transferred to CIR on 03/05/2018 .    Patient currently requires min with IADL secondary to muscle weakness, decreased cardiorespiratoy endurance, decreased safety awareness, self monitoring, and decreased standing balance, decreased postural control and decreased balance strategies.  Prior to hospitalization, patient could complete ADLs/IADLS with independent .  Patient will benefit from skilled intervention to decrease level of assist with basic self-care skills and increase level of independence with iADL prior to discharge home with care partner.  Anticipate patient will require 24 hour supervision and no further OT follow recommended.  OT - End of Session Activity Tolerance: Tolerates 30+ min activity with multiple rests Endurance Deficit: Yes Endurance Deficit Description: generalized deconditioning OT Assessment Rehab Potential (ACUTE ONLY): Excellent OT Patient demonstrates impairments in the following area(s): Balance;Endurance;Perception;Vision;Behavior;Safety;Cognition OT Basic ADL's Functional Problem(s): Bathing;Dressing;Toileting OT Advanced ADL's Functional Problem(s): Laundry;Full Meal Preparation OT Transfers Functional Problem(s): Toilet;Tub/Shower OT Additional Impairment(s): None OT Plan OT Intensity: Minimum of 1-2 x/day, 45 to 90 minutes OT Frequency: 5 out of 7 days OT Duration/Estimated Length of Stay: 5-7 days OT  Treatment/Interventions: Balance/vestibular training;Community reintegration;Disease mangement/prevention;Patient/family education;Self Care/advanced ADL retraining;Therapeutic Exercise;UE/LE Coordination activities;Discharge planning;Cognitive remediation/compensation;DME/adaptive equipment instruction;Functional mobility training;Pain management;Psychosocial support;Therapeutic Activities;UE/LE Strength taining/ROM;Visual/perceptual remediation/compensation OT Self Feeding Anticipated Outcome(s): mod I OT Basic Self-Care Anticipated Outcome(s): mod I OT  Toileting Anticipated Outcome(s): mod I OT Bathroom Transfers Anticipated Outcome(s): mod I OT Recommendation Recommendations for Other Services: Neuropsych consult Patient destination: Home Follow Up Recommendations: None Equipment Recommended: To be determined   Skilled Therapeutic Intervention Skilled OT evaluation completed with edu provided to pt and pt's SO present re TBI recovery, fall prevention, ELOS, OT POC, and condition insight. Reviewed unit's policy on enclosure bed and fall prevention thoroughly. Pt completed b/d tasks sitting in front of sink with (S) only. Pt impulsively stood several times during session. Multiple LOB with pt able to correct, min A provided. Pt completed 100 ft of functional mobility, using rail as support with CGA/min A provided throughout. Pt completed 9 hole peg test and dynamometer testing in gym. Pt stood on blue foam block and completed functional reaching to challenge dynamic standing balance. Pt completed standing level B UE coordination/strengthening activities. Pt's SO had several questions throughout session, all of which were answered. Pt returned to her room and her SO was signed off for bathroom transfers- demonstrating safety and understanding of body mechanics and fall prevention techniques. Pt was left sitting up in enclosure bed with SO present and told to alert RN when he leaves room for safety.    OT Evaluation Precautions/Restrictions  Precautions Precautions: Fall Restrictions Weight Bearing Restrictions: No General Chart Reviewed: Yes Family/Caregiver Present: Yes  Pain Pain Assessment Pain Scale: 0-10 Pain Score: 0-No pain Home Living/Prior Functioning Home Living Family/patient expects to be discharged to:: Private residence Living Arrangements: Spouse/significant other Available Help at Discharge: Family, Available PRN/intermittently Type of Home: Apartment Home Access: Stairs to enter Technical brewer of Steps: 1 Entrance Stairs-Rails: Left Home Layout: One level Bathroom Shower/Tub: Chiropodist: Standard  Lives With: Alone IADL History Homemaking Responsibilities: Yes Meal Prep Responsibility: Primary Laundry Responsibility: Primary Cleaning Responsibility: Primary Bill Paying/Finance Responsibility: Primary Shopping Responsibility: Primary Current License: Yes Mode of Transportation: Car Education: Some college Occupation: Full time employment Prior Function Level of Independence: Independent with basic ADLs, Independent with homemaking with ambulation, Independent with gait  Able to Take Stairs?: Yes Driving: Yes Vocation: Full time employment 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, 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   Vision Baseline Vision/History: Wears glasses Wears Glasses: At all times Patient Visual Report: No change from baseline Vision Assessment?: Yes Eye Alignment: Within Functional Limits Ocular Range of Motion: Within Functional Limits Alignment/Gaze Preference: Within Defined Limits Tracking/Visual Pursuits: Decreased smoothness of horizontal tracking;Decreased smoothness of vertical tracking Saccades: Within functional limits Convergence: Within functional limits Visual Fields: No apparent deficits Perception  Perception: Within Functional Limits Praxis Praxis: Intact Cognition Overall Cognitive Status: Impaired/Different  from baseline Arousal/Alertness: Awake/alert Orientation Level: Person;Place;Situation Person: Oriented Place: Oriented Situation: Oriented Year: 2020 Month: November Day of Week: Correct Memory: Impaired Memory Impairment: Decreased short term memory;Decreased recall of new information;Storage deficit Decreased Short Term Memory: Functional complex;Verbal complex Immediate Memory Recall: Sock;Blue;Bed Memory Recall: Sock;Blue Memory Recall Sock: Without Cue Memory Recall Blue: Without Cue Attention: Selective Focused Attention: Appears intact Focused Attention Impairment: Verbal basic Selective Attention: Impaired Selective Attention Impairment: Verbal complex;Functional complex Awareness: Impaired Awareness Impairment: Anticipatory impairment Problem Solving: Impaired Problem Solving Impairment: Verbal complex;Functional complex Executive Function: Reasoning Reasoning: Impaired Reasoning Impairment: Functional complex;Verbal complex Self Monitoring: Impaired Safety/Judgment: Impaired Comments: Decreased anticipatory awareness [employer] Scales of Cognitive Functioning: Automatic/appropriate Sensation Sensation Light Touch: Appears Intact Hot/Cold: Appears Intact Proprioception: Appears Intact Stereognosis: Appears Intact Coordination Gross Motor Movements are Fluid and Coordinated: Yes Fine Motor Movements are Fluid and Coordinated:  Yes Finger Nose Finger Test: [facility] 9 Hole Peg Test: R: 22 sec, L: 30 sec Motor  Motor Motor: Other (comment) Motor - Skilled Clinical Observations: generalized weakness Mobility  Bed Mobility Bed Mobility: Rolling Right;Rolling Left Rolling Right: Independent Rolling Left: Independent Transfers Sit to Stand: Contact Guard/Touching assist Stand to Sit: Contact Guard/Touching assist  Trunk/Postural Assessment  Cervical Assessment Cervical Assessment: Within Functional Limits Thoracic Assessment Thoracic Assessment: Within  Functional Limits Lumbar Assessment Lumbar Assessment: Within Functional Limits Postural Control Postural Control: Within Functional Limits  Balance Balance Balance Assessed: Yes Static Sitting Balance Static Sitting - Balance Support: Feet supported Static Sitting - Level of Assistance: 6: Modified independent (Device/Increase time) Dynamic Sitting Balance Dynamic Sitting - Balance Support: Feet supported Dynamic Sitting - Level of Assistance: 5: Stand by assistance Static Standing Balance Static Standing - Balance Support: During functional activity Static Standing - Level of Assistance: 5: Stand by assistance Dynamic Standing Balance Dynamic Standing - Balance Support: During functional activity Dynamic Standing - Level of Assistance: 4: Min assist Extremity/Trunk Assessment RUE Assessment General Strength Comments: R grip strength: 40 lbs LUE Assessment General Strength Comments: L grip strength: 31 lbs     Refer to Care Plan for Long Term Goals  Recommendations for other services: Neuropsych   Discharge Criteria: Patient will be discharged from OT if patient refuses treatment 3 consecutive times without medical reason, if treatment goals not met, if there is a change in medical status, if patient makes no progress towards goals or if patient is discharged from hospital.  The above assessment, treatment plan, treatment alternatives and goals were discussed and mutually agreed upon: by patient and by family  Curtis Sites 03/06/2018, 12:15 PM

## 2018-03-06 NOTE — Discharge Summary (Deleted)
  The note originally documented on this encounter has been moved the the encounter in which it belongs.  

## 2018-03-07 ENCOUNTER — Inpatient Hospital Stay (HOSPITAL_COMMUNITY): Payer: Managed Care, Other (non HMO)

## 2018-03-07 ENCOUNTER — Inpatient Hospital Stay (HOSPITAL_COMMUNITY): Payer: Managed Care, Other (non HMO) | Admitting: Speech Pathology

## 2018-03-07 ENCOUNTER — Inpatient Hospital Stay (HOSPITAL_COMMUNITY): Payer: Managed Care, Other (non HMO) | Admitting: Physical Therapy

## 2018-03-07 NOTE — Progress Notes (Signed)
Occupational Therapy Session Note  Patient Details  Name: Alyssa Little MRN: [id number] Date of Birth: [birth year]  Today's Date: 03/07/2018 OT Individual Time: 2992-4268 OT Individual Time Calculation (min): 54 min    Short Term Goals: Week 1:  OT Short Term Goal 1 (Week 1): STG=LTG d/t ELOS  Skilled Therapeutic Interventions/Progress Updates:    Session focused on b/d tasks at shower level, cognitive remediation, and IADL tasks. Pt excited to take shower- gathering items and transferred into walk in shower with (S). Pt completed all bathing at (S) level with good judgement and safety awareness. Pt donned LB clothing in standing with (S), edu provided for safety awareness at home. Pt then requested to make a cup of tea, she completed functional mobility down to IADL kitchen, where she microwaved water with tea bag- unclear as to whether pt was confused in these steps or if this was a prior way of preparing tea. Pt attempted to find way back to room, able to turn correct way down hallway but with questioning cues was unable to locate her room, despite being the only room with a large enclosure bed. Once in room, pt completed several tabletop cognitive tasks focused on sequencing steps in familiar routines and spatial orientation. Pt did both these tasks with only very min cueing. Pt was left in enclosure bed with all needs met.   Therapy Documentation Precautions:  Precautions Precautions: Fall Restrictions Weight Bearing Restrictions: No Pain: Pain Assessment Pain Scale: 0-10 Pain Score: 0-No pain   Therapy/Group: Individual Therapy  Curtis Sites 03/07/2018, 3:06 PM

## 2018-03-07 NOTE — Progress Notes (Signed)
Speech Language Pathology Daily Session Note  Patient Details  Name: Alyssa LacyChristina Danielle Little MRN: [id number] Date of Birth: 07/25/1980  Today's Date: 03/07/2018 SLP Individual Time: [id number] SLP Individual Time Calculation (min): 45 min  Short Term Goals: Week 1: SLP Short Term Goal 1 (Week 1): Pt will complete complex problem solving tasks with Min A cues.  SLP Short Term Goal 2 (Week 1): Pt will demonstrate selective attention in moderately distracting environment for ~ 30 minutes with supervision cues.  SLP Short Term Goal 3 (Week 1): With supervision questions, pt will demonstrate anticipatory awareness by identifying 3 safe vs. unsafe activities within home environment.  SLP Short Term Goal 4 (Week 1): Pt will utilize external memory aids to recall new daily information with supervision cues.   Skilled Therapeutic Interventions:  Skilled treatment session focused on cognition goals. SLP facilitated session by providing Min A to complete simple deductive reasoning puzzle. Pt with increased difficulty with inferred information. However, she is able to immediately return demonstrate use of compensatroy problem solving strategy to effectively solve puzzle. Pt demonstrates increased insight/awareness of current situation as she states she is tempted to "wander around unit" if unsupervised (i.e., need for telesitter). Pt able to exhibit s/s of divided attention for ~ 15 minutes within session. [name].       Pain Pain Assessment Pain Scale: 0-10 Pain Score: 0-No pain  Therapy/Group: Individual Therapy  Ovetta Bazzano 03/07/2018, 8:04 AM

## 2018-03-07 NOTE — Care Management (Signed)
[facility] Individual Statement of Services  Patient Name:  Alyssa LacyChristina Danielle Little  Date:  03/07/2018  Welcome to the [facility].  Our goal is to provide you with an individualized program based on your diagnosis and situation, designed to meet your specific needs.  With this comprehensive rehabilitation program, you will be expected to participate in at least 3 hours of rehabilitation therapies Monday-Friday, with modified therapy programming on the weekends.  Your rehabilitation program will include the following services:  Physical Therapy (PT), Occupational Therapy (OT), Speech Therapy (ST), 24 hour per day rehabilitation nursing, Therapeutic Recreaction (TR), Neuropsychology, Case Management (Social Worker), Rehabilitation Medicine, Nutrition Services and Pharmacy Services  Weekly team conferences will be held on Tuesdays to discuss your progress.  Your Social Worker will talk with you frequently to get your input and to update you on team discussions.  Team conferences with you and your family in attendance may also be held.  Expected length of stay: 5-7 days   Overall anticipated outcome: supervision  Depending on your progress and recovery, your program may change. Your Social Worker will coordinate services and will keep you informed of any changes. Your Social Worker's name and contact numbers are listed  below.  The following services may also be recommended but are not provided by the [facility]:   Driving Evaluations  Home Health Rehabiltiation Services  Outpatient Rehabilitation Services  Vocational Rehabilitation   Arrangements will be made to provide these services after discharge if needed.  Arrangements include referral to agencies that provide these services.  Your insurance has been verified to be:  [place] Your primary doctor is:  None  Pertinent information will be shared with your doctor and your  insurance company.  Social Worker:  [place]cy [place], [place] [phone] or ([phone]) [phone]   Information discussed with and copy given to patient by: Amada JupiterHOYLE, Shikira Folino, 03/07/2018, 4:17 PM

## 2018-03-07 NOTE — Progress Notes (Signed)
Social Work  Social Work Assessment and Plan  Patient Details  Name: Alyssa Little MRN: [id number] Date of Birth: 08/12/1980  Today's Date: 03/07/2018  Problem List:  Patient Active Problem List   Diagnosis Date Noted  . TBI (traumatic brain injury) ([facility]) 03/05/2018  . ETOH abuse   . Tachypnea   . Traumatic brain injury with loss of consciousness ([facility])   . Sinus tachycardia   . Seizures ([facility])   . Acute blood loss anemia   . Malnutrition of moderate degree 02/26/2018  . SAH (subarachnoid hemorrhage) ([facility]) 02/24/2018   Past Medical History: History reviewed. No pertinent past medical history. Past Surgical History: History reviewed. No pertinent surgical history. Social History:  has no tobacco, alcohol, and drug history on file.  Family / Support Systems Marital Status: Single Patient Roles: Partner(boyfriend, Consulting civil engineerete) Spouse/Significant Other: boyfriend, Virl Sonete Grahl @ (C) [phone] Children: None Other Supports: grandmother, Josefina DoKris ([place]) [phone];  aunt, Jeanmarie PlantCaitlin ([place]) @ [phone] Anticipated Caregiver: Cindee LamePete and aunt and grandmother Ability/Limitations of Caregiver: Min A Caregiver Availability: 24/7 Family Dynamics: Pt notes good relationships with family and between family and boyfriend.  Social History Preferred language: English Religion: None Cultural Background: NA Read: Yes Write: Yes Employment Status: Employed Name of Employer: [employer] Length of Employment: 20(yrs) Return to Work Plans: Pt very hopeful she will be able to return to work  Fish farm managerLegal Hisotry/Current Legal Issues: None Guardian/Conservator: None - per MD, pt is capable of making decisions on her own behalf   Abuse/Neglect Abuse/Neglect Assessment Can Be Completed: Yes Physical Abuse: Denies Verbal Abuse: Denies Sexual Abuse: Denies Exploitation of patient/patient's resources: Denies Self-Neglect: Denies  Emotional Status Pt's affect, behavior adn adjustment  status: Pt sitting up in enclosure bed.  Flat affect and offers brief, direct answers to questions.  She is "bothered" by encloser bed and "not being able to go to the bathroom when I need to."  She admits much frustration with hospitalization overall and eager to return home with her boyfriend and cat.  Will involve neuropsychology for additional eval / information and support. Recent Psychosocial Issues: None Pyschiatric History: None Substance Abuse History: None  Patient / Family Perceptions, Expectations & Goals Pt/Family understanding of illness & functional limitations: Pt and family with general understanding of her seizure, fall and resulting TBI.  Aware she is having functional limitations as a result, however, feels she will improve better at home. Premorbid pt/family roles/activities: Working f/t Anticipated changes in roles/activities/participation: Per supervision goals, boyfriend to provide primary caregiver support Pt/family expectations/goals: Pt eager to d/c ASAP and be able to return to work.  Community Resources Levi StraussCommunity Agencies: None Premorbid Home Care/DME Agencies: None Transportation available at discharge: yes Resource referrals recommended: Support group (specify), Neuropsychology  Discharge Planning Living Arrangements: Spouse/significant other Support Systems: Spouse/significant other, Other relatives Type of Residence: Private residence Insurance Resources: Media plannerrivate Insurance (specify)([employer]) Financial Resources: Employment Financial Screen Referred: No Money Management: Patient Does the patient have any problems obtaining your medications?: No Home Management: pt and bf Patient/Family Preliminary Plans: Pt to return to her home where boyfriend and neighbor can cover close to 24/7 supervision Social Work Anticipated Follow Up Needs: HH/OP, Support Group Expected length of stay: 5-7 days  Clinical Impression Unfortunate woman here following a seizure  suffered at work with fall and additional TBI.  Good support from boyfriend, neighbor and family (who live in [place]  Pt eager to return home to be with cat and becomes tearful about this.  Team is anticipating short LOS.  Will follow for support and d/c planning needs.  Rodric Punch 03/07/2018, 4:28 PM

## 2018-03-07 NOTE — Progress Notes (Signed)
[facility] PHYSICAL MEDICINE & REHABILITATION PROGRESS NOTE   Subjective/Complaints: Had a reasonable night. Feels a little foggy. Denies pain. Working with SLP  ROS: Patient denies fever, rash, sore throat, blurred vision, nausea, vomiting, diarrhea, cough, shortness of breath or chest pain, joint or back pain, headache, or mood change.   Objective:   No results found. Recent Labs    03/05/18 0946 03/06/18 0427  WBC 8.3 8.4  HGB 11.4* 11.5*  HCT 34.7* 35.3*  PLT 593* 695*   Recent Labs    03/05/18 0946 03/06/18 0427  NA 135 135  K 3.5 3.5  CL 103 102  CO2 23 24  GLUCOSE 100* 96  BUN <5* 5*  CREATININE 0.45 0.47  CALCIUM 9.1 9.4    Intake/Output Summary (Last 24 hours) at 03/07/2018 0913 Last data filed at 03/06/2018 2300 Gross per 24 hour  Intake 1320 ml  Output -  Net 1320 ml     Physical Exam: Vital Signs Blood pressure 99/69, pulse 93, temperature 98.8 F (37.1 C), temperature source Oral, resp. rate 17, height 5\' 2"  (1.575 m), weight 45.9 kg, SpO2 97 %. Constitutional: No distress . Vital signs reviewed. HEENT: EOMI, oral membranes moist Neck: supple Cardiovascular: RRR without murmur. No JVD    Respiratory: CTA Bilaterally without wheezes or rales. Normal effort    GI: BS +, non-tender, non-distended  Musculoskeletal:  No edema or tenderness in extremities  Neurological: Oriented to hospital, name. Told me where she lived. Told me about her job. More focused .  Motor: Grossly 4+/5 throughout  Skin: Skin is warm and dry.  Psychiatric: cooperative. More focused     Assessment/Plan: 1. Functional deficits secondary to seizures/TBI which require 3+ hours per day of interdisciplinary therapy in a comprehensive inpatient rehab setting.  Physiatrist is providing close team supervision and 24 hour management of active medical problems listed below.  Physiatrist and rehab team continue to assess barriers to discharge/monitor patient progress toward  functional and medical goals  Care Tool:  Bathing    Body parts bathed by patient: Right arm, Left arm, Chest, Abdomen, Right upper leg, Left upper leg, Right lower leg, Left lower leg, Face         Bathing assist Assist Level: Supervision/Verbal cueing     Upper Body Dressing/Undressing Upper body dressing   What is the patient wearing?: Pull over shirt    Upper body assist Assist Level: Independent    Lower Body Dressing/Undressing Lower body dressing      What is the patient wearing?: Hospital gown only     Lower body assist Assist for lower body dressing: Supervision/Verbal cueing     Toileting Toileting    Toileting assist Assist for toileting: Contact Guard/Touching assist     Transfers Chair/bed transfer  Transfers assist     Chair/bed transfer assist level: Contact Guard/Touching assist     Locomotion Ambulation   Ambulation assist      Assist level: Contact Guard/Touching assist Assistive device: Hand held assist Max distance: 150   Walk 10 feet activity   Assist     Assist level: Minimal Assistance - Patient > 75%     Walk 50 feet activity   Assist    Assist level: Minimal Assistance - Patient > 75%      Walk 150 feet activity   Assist    Assist level: Minimal Assistance - Patient > 75%      Walk 10 feet on uneven surface  activity  Assist     Assist level: Minimal Assistance - Patient > 75%     Wheelchair     Assist Will patient use wheelchair at discharge?: No             Wheelchair 50 feet with 2 turns activity    Assist            Wheelchair 150 feet activity     Assist           Medical Problem List and Plan: 1.   Decreased functional mobility and mental status secondary to seizure and traumatic SAH/TBI  -Continue CIR therapies including PT, OT, and SLP   -making nice cognitive gains  -dc enclosure bed  2.  DVT Prophylaxis/Anticoagulation: SCDs. Monitor for any signs  of DVT 3. Pain Management:  Tylenol as needed 4. Mood:  Provide emotional support, environmental mods, education 5. Neuropsych:  Provide support as available.  6. Skin/Wound Care:  Routine skin checks 7. Fluids/Electrolytes/Nutrition: encourage PO  -albumin trending up, protein supp 8. Seizure disorder. Dilantin 150 mg 3 times a day, Keppra 1500 mg twice a day, Vimpat 200 mg twice daily  -no seizure activity  -follow up with neuro as outpt to decide on ongoing med regimen 9. Alcohol use. Alcohol level of 11 on discharge. Provide counseling when appropriate 10. ABLA---no supplement, dietary iron    LOS: 2 days A FACE TO FACE EVALUATION WAS PERFORMED  Ranelle OysterZachary T Swartz 03/07/2018, 9:13 AM

## 2018-03-07 NOTE — Progress Notes (Signed)
Physical Therapy Session Note  Patient Details  Name: Alyssa LacyChristina Danielle Little MRN: [id number] Date of Birth: 10/25/1980  Today's Date: 03/07/2018 PT Individual Time: [id number] PT Individual Time Calculation (min): 44 min   Short Term Goals: Week 1:  PT Short Term Goal 1 (Week 1): = LTG  Skilled Therapeutic Interventions/Progress Updates:    pt performs gait throughout unit with supervision.  Pt performs standing balance exercises on tilt board, bosu ball and foam mat for ankle and hip reactions as well as balance mm strengthening.  Pt requires 1 UE support for standing on bosu and tilt board, able to play catch standing on foam with close supervision.  Gait forward and backward with ball toss with supervision, pt able to self correct LOB.  Standing kinetron for strength and endurance 4 x 1 minute. Pt left in room with significant other present, needs at hand.  Therapy Documentation Precautions:  Precautions Precautions: Fall Restrictions Weight Bearing Restrictions: No Pain: Pain Assessment Pain Scale: 0-10 Pain Score: 0-No pain   Therapy/Group: Individual Therapy  [name] 03/07/2018, 11:15 AM

## 2018-03-07 NOTE — Progress Notes (Signed)
Uneventful night. Friend at bedside. A & O to self, easily redirected. Foam dressing changed to right scalp, no sutures observed. Continent of B & B. Telesitter ordered. Alfredo MartinezMurray, Jabree Pernice A

## 2018-03-07 NOTE — IPOC Note (Signed)
Overall Plan of Care [facility](IPOC) Patient Details Name: Alyssa LacyChristina Danielle Little MRN: [id number] DOB: 02/24/1981  Admitting Diagnosis: <principal problem not specified>  Hospital Problems: Active Problems:   TBI (traumatic brain injury) (HCC)     Functional Problem List: Nursing    PT Balance, Endurance, Motor, Safety  OT Balance, Endurance, Perception, Vision, Behavior, Safety, Cognition  SLP    TR         Basic ADL's: OT Bathing, Dressing, Toileting     Advanced  ADL's: OT Laundry, Full Meal Preparation     Transfers: PT Bed Mobility, Bed to Chair, Car, Furniture, Civil [employer] fast [place]  OT Toilet, Tub/Shower     Locomotion: PT Stairs, Ambulation     Additional Impairments: OT None  SLP Social Cognition   Problem Solving, Memory, Attention, Awareness  TR      Anticipated Outcomes Item Anticipated Outcome  Self Feeding mod I  Swallowing      Basic self-care  mod I  Toileting  mod I   Bathroom Transfers mod I  Bowel/Bladder     Transfers  supervision  Locomotion  supervision  Communication     Cognition  Supervision  Pain     Safety/Judgment      Therapy Plan: PT Intensity: Minimum of 1-2 x/day ,45 to 90 minutes PT Frequency: 5 out of 7 days PT Duration Estimated Length of Stay: 5-7 days OT Intensity: Minimum of 1-2 x/day, 45 to 90 minutes OT Frequency: 5 out of 7 days OT Duration/Estimated Length of Stay: 5-7 days SLP Intensity: Minumum of 1-2 x/day, 30 to 90 minutes SLP Frequency: 3 to 5 out of 7 days SLP Duration/Estimated Length of Stay: 5 to 7 days    Team Interventions: Nursing Interventions    PT interventions Ambulation/gait training, Discharge planning, Functional mobility training, Therapeutic Activities, Balance/vestibular training, Neuromuscular re-education, Therapeutic Exercise, Wheelchair propulsion/positioning, Cognitive remediation/compensation, DME/adaptive equipment instruction, Pain management, Splinting/orthotics, UE/LE Strength  taining/ROM, FirefighterCommunity reintegration, Equities traderatient/family education, Museum/gallery curatortair training, UE/LE Coordination activities  OT Interventions Warden/rangerBalance/vestibular training, Community reintegration, Disease mangement/prevention, Equities traderatient/family education, Self Care/advanced ADL retraining, Therapeutic Exercise, UE/LE Coordination activities, Discharge planning, Cognitive remediation/compensation, DME/adaptive equipment instruction, Functional mobility training, Pain management, Psychosocial support, Therapeutic Activities, UE/LE Strength taining/ROM, Visual/perceptual remediation/compensation  SLP Interventions Cognitive remediation/compensation, Internal/external aids, Medication managment, Patient/family education, Functional tasks  TR Interventions    SW/CM Interventions     Barriers to Discharge MD  Behavior  Nursing      PT Decreased caregiver support    OT      SLP      SW       Team Discharge Planning: Destination: PT-Home ,OT- Home , SLP-Home Projected Follow-up: PT-Outpatient PT, OT-  None, SLP-Outpatient SLP Projected Equipment Needs: PT-None recommended by PT, OT- To be determined, SLP-None recommended by SLP Equipment Details: PT- , OT-  Patient/family involved in discharge planning: PT- Patient,  OT-Family member/caregiver, SLP-Patient, Family member/caregiver  MD ELOS: 5-7 days Medical Rehab Prognosis:  Excellent Assessment: The patient has been admitted for CIR therapies with the diagnosis of TBI/seizure. The team will be addressing functional mobility, strength, stamina, balance, safety, adaptive techniques and equipment, self-care, bowel and bladder mgt, patient and caregiver education, cognition, behavior, family ed. Goals have been set at mod I with self-care, supervision to mod I with mobility and supervision for cognition.    Ranelle OysterZachary T. Nerida Boivin, MD, FAAPMR      See Team Conference Notes for weekly updates to the plan of care

## 2018-03-07 NOTE — Progress Notes (Signed)
Physical Therapy Session Note  Patient Details  Name: Alyssa Little MRN: [id number] Date of Birth: [birth year]  Today's Date: 03/07/2018 PT Individual Time: 1500-1540 PT Individual Time Calculation (min): 40 min   Short Term Goals: Week 1:  PT Short Term Goal 1 (Week 1): = LTG  Skilled Therapeutic Interventions/Progress Updates:   Pt received supine in bed and agreeable to PT. Supine>sit transfer without assist or cues from PT  PT instructed pt in dynamic balance training and problem solving task of Giant connect four  x 5 rounds. Pt required moderate cues for problem solving and anticipatory awareness to plan next step in each game.   Gait through hall x 271f, [id number] [id number] [id number] assist from PT for safety and direction in semi-familiar environment. Pt noted to have 2 near LOB, but able to self correct without additional assist from PT.   Dynamic balance and dual task training while engaged in [place] x 10 minutes. Distant supervision assist from PT for safety and min cues for error correction when  Pt returned to room and performed ambulatory  transfer to bed with supervision assist for safety. Sit>supine completed without assist and left supine in bed with call bell in reach and all needs met.       Therapy Documentation Precautions:  Precautions Precautions: Fall Restrictions Weight Bearing Restrictions: No    Pain: Pain Assessment Pain Scale: 0-10 Pain Score: 0-No pain    Therapy/Group: Individual Therapy  AustLorie Phenix22/2019, 3:44 PM

## 2018-03-08 ENCOUNTER — Inpatient Hospital Stay (HOSPITAL_COMMUNITY): Payer: Managed Care, Other (non HMO) | Admitting: Speech Pathology

## 2018-03-08 ENCOUNTER — Inpatient Hospital Stay (HOSPITAL_COMMUNITY): Payer: Managed Care, Other (non HMO) | Admitting: Physical Therapy

## 2018-03-08 ENCOUNTER — Inpatient Hospital Stay (HOSPITAL_COMMUNITY): Payer: Managed Care, Other (non HMO) | Admitting: Occupational Therapy

## 2018-03-08 NOTE — Progress Notes (Signed)
Alyssa Little is a 37 y.o. female 12/13/1980 [id number]  Subjective: No new complaints. No new problems. Slept well. Feeling OK.  Objective: Vital signs in last 24 hours: Temp:  [97.8 F (36.6 C)-98.9 F (37.2 C)] 98.9 F (37.2 C) (11/23 0547) Pulse Rate:  [73-85] 73 (11/23 0547) Resp:  [16-18] 16 (11/23 0547) BP: (92-118)/(62-95) 92/62 (11/23 0547) SpO2:  [79 %-100 %] 100 % (11/23 0547) Weight change:  Last BM Date: 03/07/18  Intake/Output from previous day: 11/22 0701 - 11/23 0700 In: 960 [P.O.:960] Out: -  Last cbgs: CBG (last 3)  No results for input(s): GLUCAP in the last 72 hours.   Physical Exam General: No apparent distress   HEENT: not dry Lungs: Normal effort. Lungs clear to auscultation, no crackles or wheezes. Cardiovascular: Regular rate and rhythm, no edema Abdomen: S/NT/ND; BS(+) Musculoskeletal:  unchanged Neurological: No new neurological deficits Wounds: Clean Skin: clear   Mental state: Alert,  cooperative    Lab Results: BMET    Component Value Date/Time   NA 135 03/06/2018 0427   K 3.5 03/06/2018 0427   CL 102 03/06/2018 0427   CO2 24 03/06/2018 0427   GLUCOSE 96 03/06/2018 0427   BUN 5 (L) 03/06/2018 0427   CREATININE 0.47 03/06/2018 0427   CALCIUM 9.4 03/06/2018 0427   GFRNONAA >60 03/06/2018 0427   GFRAA >60 03/06/2018 0427   CBC    Component Value Date/Time   WBC 8.4 03/06/2018 0427   RBC 3.55 (L) 03/06/2018 0427   HGB 11.5 (L) 03/06/2018 0427   HCT 35.3 (L) 03/06/2018 0427   PLT 695 (H) 03/06/2018 0427   MCV 99.4 03/06/2018 0427   MCH 32.4 03/06/2018 0427   MCHC 32.6 03/06/2018 0427   RDW 18.5 (H) 03/06/2018 0427   LYMPHSABS 1.4 03/06/2018 0427   MONOABS 1.9 (H) 03/06/2018 0427   EOSABS 0.5 03/06/2018 0427   BASOSABS 0.3 (H) 03/06/2018 0427    Studies/Results: No results found.  Medications: I have reviewed the patient's current medications.  Assessment/Plan:   1.  Traumatic SAH/TBI.  Continue with  CIR including PT, OT, SLP 2.  DVT prophylaxis with SCDs 3.  Pain management with Tylenol PRN 4.  Emotional support 5.  Seizure disorder.  On Dilantin, Keppra and Vimpat. 6.  History of alcohol use.  Provide counseling when appropriate 7.  Anemia.  On p.o. iron    Length of stay, days: 3  Sonda PrimesAlex Tawona Filsinger , MD 03/08/2018, 2:01 PM

## 2018-03-08 NOTE — Progress Notes (Signed)
Physical Therapy Session Note  Patient Details  Name: Alyssa Little MRN: [id number] Date of Birth: Sep 24, 1980  Today's Date: 03/08/2018 PT Individual Time: 1545-1700 PT Individual Time Calculation (min): 75 min   Short Term Goals: Week 1:  PT Short Term Goal 1 (Week 1): = LTG  Skilled Therapeutic Interventions/Progress Updates:   Pt received supine in bed and agreeable to PT.   Ambulatory toilet transfer x2 throughout session. Pt supervision level for toileting, pericare, and clothing management for pt safety.   Gait training with no AD x175f, [id number] supervision level assist from SPT. Verbal cues for wayfinding/navigation to and from activities. Mod verbal cues for directions to return to room.  Dynamic balance bouncing basketball while gait training through hall, up ramp, and over uneven surfaces x120 ft. Supervision assist from SPT. Verbal cues for navigating while dual tasking.  Cornhole on airex pad x2. Cognitive task incorporated with pt having to locate specific beanbags to throw. CGA assist; near LOB x2 pt self corrected. Min verbal cues for pt safety.  Stair training x16 steps with supervision assist. Min cues for pt safety and awareness.  Completed puzzle while standing on airex pad x2, supervision assist. Min verbal cues for problem solving cognitive task.  Balance on bosu ball 1 min, x2. Min assist from SPT for safety.  BaDiona Foleyoss to trampoline on foam 10 reps x3. Supervision assist from SPT for pt safety. Min cuing for safely stepping on/off foam pad.  Ended session with pt supine in bed, call bell within reach, and all needs met.   Therapy Documentation Precautions:  Precautions Precautions: Fall Restrictions Weight Bearing Restrictions: No   Vital Signs: Therapy Vitals Temp: 98.4 F (36.9 C) Pulse Rate: 93 Resp: 20 BP: 113/83 Patient Position (if appropriate): Sitting Oxygen Therapy SpO2: 100 % O2 Device: Room Air Pain: Pain  Assessment Pain Scale: 0-10 Pain Score: 0-No pain   Therapy/Group: Individual Therapy  AmAmador Cunas1/23/2019, 5:54 PM

## 2018-03-08 NOTE — Progress Notes (Signed)
Occupational Therapy Session Note  Patient Details  Name: Warren LacyChristina Danielle Freyre MRN: [id number] Date of Birth: 01/31/1981  Today's Date: 03/08/2018 OT Individual Time: [id number] OT Individual Time Calculation (min): 75 min   Short Term Goals: Week 1:  OT Short Term Goal 1 (Week 1): STG=LTG d/t ELOS  Skilled Therapeutic Interventions/Progress Updates:    Pt greeted in bed with partner Cindee Lameete present. Just started breakfast. While she ate, discussed d/c plans with pt and partner. They will need a TTB for home. Emphasized importance of having pt participate in IADLs daily to further improve cognitive and physical skills. She reports that she mostly watches TV when not working. Discussed her leisure interests, which included journaling. Provided her with journal to serve as memory log and means for emotional coping s/p brain injury. She ambulated holding breakfast tray to place on hallway cart with cuing, and then she ambulated with supervision assist to therapy apartment. Had pt engage in familiar IADL tasks, such as vacuuming and simulated pet care (for her cat). During both tasks, pt able to stoop to floor with supervision and cues for bending knees vs back. Able to reach outside of base of support with close supervision and pt actively maintaining staggered stance for stability. When pathfinding way back to room, max vcs required while using external aides. She named necessary items for bedmaking while standing at linen cart, and she carried linen back to room. Worked on cognition and higher level balance while pt made bed. She required max instructional cues for sequencing, trying to apply blankets and chuck pad prior to fitted sheet. Once given instruction, she was able to execute motor demands of task with supervision. After donning pillowcases in standing, pt returned EOB. She was left with [name] at session exit.     Cindee Lameete present throughout session to observe. We discussed cuing methods to utilize  when facilitating pt problem solving and functional independence   Therapy Documentation Precautions:  Precautions Precautions: Fall Restrictions Weight Bearing Restrictions: No Pain: No c/o pain during session    ADL:        Therapy/Group: Individual Therapy  Dawn Kiper A Zaiah Eckerson 03/08/2018, 12:11 PM

## 2018-03-08 NOTE — Progress Notes (Signed)
Speech Language Pathology Daily Session Note  Patient Details  Name: Alyssa LacyChristina Danielle Little MRN: [id number] Date of Birth: 02/01/1981  Today's Date: 03/08/2018 SLP Individual Time: [id number] SLP Individual Time Calculation (min): 40 min  Short Term Goals: Week 1: SLP Short Term Goal 1 (Week 1): Pt will complete complex problem solving tasks with Min A cues.  SLP Short Term Goal 2 (Week 1): Pt will demonstrate selective attention in moderately distracting environment for ~ 30 minutes with supervision cues.  SLP Short Term Goal 3 (Week 1): With supervision questions, pt will demonstrate anticipatory awareness by identifying 3 safe vs. unsafe activities within home environment.  SLP Short Term Goal 4 (Week 1): Pt will utilize external memory aids to recall new daily information with supervision cues.   Skilled Therapeutic Interventions:  Pt was seen for skilled ST targeting cognitive goals.  SLP facilitated the session with a medication management task to address functional problem solving and recall goals.  Pt could recall function of medication when named for 100% accuracy with mod I but needed intermittent min assist verbal cues for task organization when loading pills into a BID pill box.  SLP recommended that pt have her boyfriend or other family members provide supervision at least initially when she is filling her pill box at home to minimize risk for error.  Pt was returned to room and left in bed with bed alarm set and nurse tech at bedside checking vitals. Continue per current plan of care.    Pain Pain Assessment Pain Scale: 0-10 Pain Score: 0-No pain  Therapy/Group: Individual Therapy  Viren Lebeau, Melanee SpryNicole L 03/08/2018, 4:48 PM

## 2018-03-09 ENCOUNTER — Inpatient Hospital Stay (HOSPITAL_COMMUNITY): Payer: Managed Care, Other (non HMO) | Admitting: Occupational Therapy

## 2018-03-09 ENCOUNTER — Inpatient Hospital Stay (HOSPITAL_COMMUNITY): Payer: Managed Care, Other (non HMO) | Admitting: Physical Therapy

## 2018-03-09 NOTE — Progress Notes (Signed)
Alyssa LacyChristina Danielle Little is a 37 y.o. female 08/13/1980 [id number]  Subjective: No new complaints. No new problems. Slept well. Feeling OK.  Objective: Vital signs in last 24 hours: Temp:  [98.4 F (36.9 C)] 98.4 F (36.9 C) (11/23 2040) Pulse Rate:  [73-98] 98 (11/24 1350) Resp:  [18-20] 20 (11/24 1350) BP: (99-113)/(62-83) 106/78 (11/24 1350) SpO2:  [97 %-100 %] 100 % (11/24 1350) Weight change:  Last BM Date: 03/08/18  Intake/Output from previous day: 11/23 0701 - 11/24 0700 In: 1080 [P.O.:1080] Out: -  Last cbgs: CBG (last 3)  No results for input(s): GLUCAP in the last 72 hours.   Physical Exam General: No apparent distress   HEENT: not dry Lungs: Normal effort. Lungs clear to auscultation, no crackles or wheezes. Cardiovascular: Regular rate and rhythm, no edema Abdomen: S/NT/ND; BS(+) Musculoskeletal:  unchanged Neurological: No new neurological deficits Wounds: clean   Skin: clear   Mental state: Alert,  cooperative    Lab Results: BMET    Component Value Date/Time   NA 135 03/06/2018 0427   K 3.5 03/06/2018 0427   CL 102 03/06/2018 0427   CO2 24 03/06/2018 0427   GLUCOSE 96 03/06/2018 0427   BUN 5 (L) 03/06/2018 0427   CREATININE 0.47 03/06/2018 0427   CALCIUM 9.4 03/06/2018 0427   GFRNONAA >60 03/06/2018 0427   GFRAA >60 03/06/2018 0427   CBC    Component Value Date/Time   WBC 8.4 03/06/2018 0427   RBC 3.55 (L) 03/06/2018 0427   HGB 11.5 (L) 03/06/2018 0427   HCT 35.3 (L) 03/06/2018 0427   PLT 695 (H) 03/06/2018 0427   MCV 99.4 03/06/2018 0427   MCH 32.4 03/06/2018 0427   MCHC 32.6 03/06/2018 0427   RDW 18.5 (H) 03/06/2018 0427   LYMPHSABS 1.4 03/06/2018 0427   MONOABS 1.9 (H) 03/06/2018 0427   EOSABS 0.5 03/06/2018 0427   BASOSABS 0.3 (H) 03/06/2018 0427    Studies/Results: No results found.  Medications: I have reviewed the patient's current medications.  Assessment/Plan:  1.  Traumatic SAH/TBI.  Continue with CIR including  PT, OT, SLP 2.  DVT prophylaxis with SCDs 3.  Pain management with Tylenol PRN 4.  Emotional support 5.  Seizure disorder.  On Dilantin, Keppra and Vimpat 6.  History of alcohol use.  Will provide counseling when appropriate 7.  Anemia.  Continue with p.o. iron    Length of stay, days: 4  Sonda PrimesAlex Alyse Kathan , MD 03/09/2018, 1:54 PM

## 2018-03-10 ENCOUNTER — Inpatient Hospital Stay (HOSPITAL_COMMUNITY): Payer: Managed Care, Other (non HMO)

## 2018-03-10 ENCOUNTER — Inpatient Hospital Stay (HOSPITAL_COMMUNITY): Payer: Managed Care, Other (non HMO) | Admitting: Occupational Therapy

## 2018-03-10 ENCOUNTER — Inpatient Hospital Stay (HOSPITAL_COMMUNITY): Payer: Managed Care, Other (non HMO) | Admitting: Physical Therapy

## 2018-03-10 ENCOUNTER — Inpatient Hospital Stay (HOSPITAL_COMMUNITY): Payer: Managed Care, Other (non HMO) | Admitting: Speech Pathology

## 2018-03-10 DIAGNOSIS — R740 Nonspecific elevation of levels of transaminase and lactic acid dehydrogenase [LDH]: Secondary | ICD-10-CM

## 2018-03-10 DIAGNOSIS — R7401 Elevation of levels of liver transaminase levels: Secondary | ICD-10-CM

## 2018-03-10 DIAGNOSIS — D62 Acute posthemorrhagic anemia: Secondary | ICD-10-CM

## 2018-03-10 DIAGNOSIS — R569 Unspecified convulsions: Secondary | ICD-10-CM

## 2018-03-10 NOTE — Progress Notes (Signed)
Occupational Therapy Discharge Summary  Patient Details  Name: Alyssa Little MRN: [id number] Date of Birth: [birth year]  Today's Date: 03/10/2018 OT Individual Time: 1300-1330 OT Individual Time Calculation (min): 30 min    Patient has met 10 of 11 long term goals due to improved activity tolerance, improved balance, postural control, ability to compensate for deficits, improved attention, improved awareness and improved coordination.  Patient to discharge at overall Independent level.  Patient's care partner is independent to provide the necessary cognitive assistance at discharge.    Reasons goals not met: Pt did not meet her cognition goal d/t ongoing memory impairments. Pt's family is willing to provide 24/7 (S) at home to compensate for this cognitive deficit.   Recommendation:  Patient requires no further OT follow up at d/c.   Equipment: No equipment provided  Reasons for discharge: treatment goals met and discharge from hospital  Patient/family agrees with progress made and goals achieved: Yes    Skilled OT Intervention: Session focused on d/c planning. Pt completed 100 ft of functional mobility independently, requiring increased time for slow pace. Pt completed tub transfer both stepping in and using TTB, both with mod I. Edu pt on recommendation to still receive (S) during the first time practicing these transfers at home. Pt completed 9 hole peg testing and dynamometer testing. Pt completed dynavision in standing, with no LOB, good safety awareness and scoring 1.34 sec reaction time bimanually. Pt able to demonstrate anticipatory awareness in planning for ADL completion at home to increase safety/reduce fall risk. Pt was passed off to SLP in room.   OT Discharge Precautions/Restrictions  Precautions Precautions: Other (comment) Precaution Comments: memory deficits  Restrictions Weight Bearing Restrictions: No Pain Pain Assessment Pain Scale: 0-10 Pain Score:  0-No pain ADL ADL Eating: Independent Where Assessed-Eating: Edge of bed Grooming: Independent Where Assessed-Grooming: Standing at sink Upper Body Bathing: Independent Where Assessed-Upper Body Bathing: Shower Lower Body Bathing: Independent Where Assessed-Lower Body Bathing: Shower Upper Body Dressing: Independent Where Assessed-Upper Body Dressing: Standing at sink Lower Body Dressing: Modified independent Where Assessed-Lower Body Dressing: Edge of bed Toileting: Modified independent Where Assessed-Toileting: Glass blower/designer: Diplomatic Services operational officer Method: Counselling psychologist: Ambulance person Transfer: Modified independent Clinical cytogeneticist Method: Administrator, arts: Radio broadcast assistant Vision Baseline Vision/History: Wears glasses Wears Glasses: At all times Patient Visual Report: No change from baseline Vision Assessment?: Yes Eye Alignment: Within Functional Limits Ocular Range of Motion: Within Functional Limits Alignment/Gaze Preference: Within Defined Limits Tracking/Visual Pursuits: Decreased smoothness of horizontal tracking;Decreased smoothness of vertical tracking(improved from eval) Saccades: Within functional limits Convergence: Within functional limits Visual Fields: No apparent deficits Perception  Perception: Within Functional Limits Praxis Praxis: Intact Cognition Overall Cognitive Status: Impaired/Different from baseline Arousal/Alertness: Awake/alert Orientation Level: Oriented to person;Oriented to place;Oriented to time;Oriented to situation Attention: Selective Focused Attention: Appears intact Selective Attention: Appears intact Memory: Impaired Memory Impairment: Decreased short term memory;Decreased recall of new information;Storage deficit Decreased Short Term Memory: Functional complex;Verbal complex Awareness: Impaired Awareness Impairment: Anticipatory impairment Problem  Solving: Impaired Problem Solving Impairment: Verbal complex;Functional complex Executive Function: Self Monitoring Reasoning: Impaired Reasoning Impairment: Functional complex;Verbal complex Self Monitoring: Impaired Self Monitoring Impairment: Verbal complex;Functional complex Safety/Judgment: Impaired Comments: Decreased anticipatory awareness [employer] [employer] Scales of Cognitive Functioning: Purposeful/appropriate Sensation Sensation Light Touch: Appears Intact Hot/Cold: Appears Intact Proprioception: Appears Intact Stereognosis: Appears Intact Coordination Gross Motor Movements are Fluid and Coordinated: Yes Fine Motor Movements are Fluid and Coordinated: Yes Finger Nose Finger Test:  [facility] 9 Hole Peg Test: R: 22 sec, L: 30 sec Motor  Motor Motor: Within Functional Limits Motor - Discharge Observations: improving strength Mobility  Bed Mobility Bed Mobility: Sit to Supine;Supine to Sit Rolling Right: Independent Rolling Left: Independent Supine to Sit: Independent Sit to Supine: Independent Transfers Sit to Stand: Independent Stand to Sit: Independent  Trunk/Postural Assessment  Cervical Assessment Cervical Assessment: Within Functional Limits Thoracic Assessment Thoracic Assessment: Within Functional Limits Lumbar Assessment Lumbar Assessment: Within Functional Limits Postural Control Postural Control: Within Functional Limits Righting Reactions: improved  Balance Balance Balance Assessed: Yes Dynamic Gait Index Level Surface: Mild Impairment Change in Gait Speed: Mild Impairment Gait with Horizontal Head Turns: Mild Impairment Gait with Vertical Head Turns: Mild Impairment Gait and Pivot Turn: Mild Impairment Step Over Obstacle: Mild Impairment Step Around Obstacles: Mild Impairment Steps: Mild Impairment Total Score: 16 Static Sitting Balance Static Sitting - Balance Support: Feet supported Static Sitting - Level of Assistance: 7: Independent Dynamic  Sitting Balance Dynamic Sitting - Balance Support: Feet supported Dynamic Sitting - Level of Assistance: 7: Independent Static Standing Balance Static Standing - Balance Support: During functional activity Static Standing - Level of Assistance: 7: Independent Dynamic Standing Balance Dynamic Standing - Balance Support: During functional activity Dynamic Standing - Level of Assistance: 6: Modified independent (Device/Increase time) Extremity/Trunk Assessment RUE Assessment RUE Assessment: Within Functional Limits General Strength Comments: R grip strength: 41 lbs LUE Assessment LUE Assessment: Within Functional Limits General Strength Comments: L grip strength: 38 lbs   Curtis Sites 03/10/2018, 1:30 PM

## 2018-03-10 NOTE — Progress Notes (Addendum)
[facility] PHYSICAL MEDICINE & REHABILITATION PROGRESS NOTE   Subjective/Complaints: Patient seen ambulating with significant other in the room this morning.  She states she slept well overnight.  She states had a good weekend.  Both patient and significant other notes improvement in physical as well as mental function.  ROS: Denies CP, SOB, N/V/D  Objective:   No results found. No results for input(s): WBC, HGB, HCT, PLT in the last 72 hours. No results for input(s): NA, K, CL, CO2, GLUCOSE, BUN, CREATININE, CALCIUM in the last 72 hours.  Intake/Output Summary (Last 24 hours) at 03/10/2018 1053 Last data filed at 03/10/2018 0826 Gross per 24 hour  Intake 1110 ml  Output -  Net 1110 ml     Physical Exam: Vital Signs Blood pressure 99/67, pulse 70, temperature 98.3 F (36.8 C), temperature source Oral, resp. rate 19, height 5\' 2"  (1.575 m), weight 45.9 kg, SpO2 97 %. Constitutional: No distress . Vital signs reviewed. HENT: Normocephalic.  Atraumatic. Eyes: EOMI. No discharge. Cardiovascular: RRR. No JVD. Respiratory: CTA Bilaterally. Normal effort. GI: BS +. Non-distended. Musc: No edema or tenderness in extremities. Neurological: Alert and oriented x3. Motor: Grossly 4+/5 throughout Skin: Skin is warm and dry.  Psychiatric: cooperative. More focused   Assessment/Plan: 1. Functional deficits secondary to seizures/TBI which require 3+ hours per day of interdisciplinary therapy in a comprehensive inpatient rehab setting.  Physiatrist is providing close team supervision and 24 hour management of active medical problems listed below.  Physiatrist and rehab team continue to assess barriers to discharge/monitor patient progress toward functional and medical goals  Care Tool:  Bathing  Bathing activity did not occur: Refused Body parts bathed by patient: Right arm, Left arm, Chest, Abdomen, Right upper leg, Left upper leg, Right lower leg, Left lower leg, Face, Buttocks,  Front perineal area         Bathing assist Assist Level: Supervision/Verbal cueing     Upper Body Dressing/Undressing Upper body dressing   What is the patient wearing?: Pull over shirt    Upper body assist Assist Level: Independent    Lower Body Dressing/Undressing Lower body dressing      What is the patient wearing?: Underwear/pull up, Pants     Lower body assist Assist for lower body dressing: Supervision/Verbal cueing     Toileting Toileting    Toileting assist Assist for toileting: Independent     Transfers Chair/bed transfer  Transfers assist     Chair/bed transfer assist level: Supervision/Verbal cueing     Locomotion Ambulation   Ambulation assist      Assist level: Supervision/Verbal cueing Assistive device: Hand held assist Max distance: 180   Walk 10 feet activity   Assist     Assist level: Supervision/Verbal cueing     Walk 50 feet activity   Assist    Assist level: Supervision/Verbal cueing      Walk 150 feet activity   Assist    Assist level: Supervision/Verbal cueing      Walk 10 feet on uneven surface  activity   Assist     Assist level: Supervision/Verbal cueing     Wheelchair     Assist Will patient use wheelchair at discharge?: No             Wheelchair 50 feet with 2 turns activity    Assist            Wheelchair 150 feet activity     Assist  Medical Problem List and Plan: 1.   Decreased functional mobility and mental status secondary to seizure and traumatic SAH/TBI  Continue CIR   Notes reviewed- found down at work thought to be secondary to seizure and TBI, images reviewed-improvement in [facility], labs reviewed  Plan for d/c tomorrow 2.  DVT Prophylaxis/Anticoagulation: SCDs. Monitor for any signs of DVT 3. Pain Management:  Tylenol as needed 4. Mood:  Provide emotional support, environmental mods, education 5. Neuropsych:  Provide support as available.  6.  Skin/Wound Care:  Routine skin checks 7. Fluids/Electrolytes/Nutrition: encourage PO 8. Seizure disorder. Dilantin 150 mg 3 times a day, Keppra 1500 mg twice a day, Vimpat 200 mg twice daily  -no seizure activity  -follow up with neuro as outpt to decide on ongoing med regimen 9. Alcohol use. Alcohol level of 11 on discharge. Provide counseling when appropriate 10. ABLA---no supplement, dietary iron  Hemoglobin 11.5 on 11/21  Continue to monitor 11.  Transaminitis  LFTs elevated on 11/21  Labs ordered for tomorrow    LOS: 5 days A FACE TO FACE EVALUATION WAS PERFORMED  Alyssa Little Alyssa Little 03/10/2018, 10:53 AM

## 2018-03-10 NOTE — Progress Notes (Signed)
Occupational Therapy Session Note  Patient Details  Name: Alyssa Little MRN: [id number] Date of Birth: [birth year]  Today's Date: 03/10/2018  Session 1 OT Individual Time: 0810-0900 OT Individual Time Calculation (min): 50 min   Session 2 OT Individual Time: 0100-7121 OT Individual Time Calculation (min): 12 min   Short Term Goals: Week 1:  OT Short Term Goal 1 (Week 1): STG=LTG d/t ELOS  Skilled Therapeutic Interventions/Progress Updates:    Pt greeted seated EOB after talking with MD. Pt ambulated to bathroom and completed toileting with supervision assist. Pt declined to shower with therapy but request to shower with significant other providing supervision. Education pt's sig other on safety precautions and fall prevention with pt. Pt ambulated to dayroom and worked on balance strategies with wii balance activity. Verbal and tactile cues to integrate hip and ankle strategies. Wii bowling activity with focus on balance when stepping outside base of support. Discussed d/c plan and safety awareness within BADL tasks. Pt able to recall room number and path find back to her room without cues, only increased time.  Pt left with partner present and needs met. \  Session 2 Pt greeted semi-reclined in bed and agreeable to OT treatment session. Discussed d/c plan, OT goals, and pt progress. Pt has progressed to overall mod I level with functional ambulation and BADL tasks. Discussed home safety awareness and modifications for safe BADL participation within home environment. Pt verbalized understanding and feels prepared for dc home tomorrow.   Therapy Documentation Precautions:  Precautions Precautions: Other (comment) Precaution Comments: memory deficits  Restrictions Weight Bearing Restrictions: No Pain: Pain Assessment Pain Scale: 0-10 Pain Score: 0-No pain   Pain Session 2 none/denies pain  Therapy/Group: Individual Awilda [place] Jeanluc Wegman 03/10/2018, 2:33 PM

## 2018-03-10 NOTE — Discharge Summary (Signed)
Discharge summary job # [id number]

## 2018-03-10 NOTE — Progress Notes (Signed)
Speech Language Pathology Discharge Summary  Patient Details  Name: Alyssa Little MRN: [id number] Date of Birth: 28-Jan-1981  Today's Date: 03/10/2018 SLP Individual Time: 1330-1415 SLP Individual Time Calculation (min): 45 min   Skilled Therapeutic Interventions:  Skilled treatment session focused on cognitive goals. SLP facilitated session by administering the CLQT. Patient scored [facility] for all cognitive domains, however, deficits in short-term recall and complex problem solving and organization were observed. Patient educated in regards to results of testing and cognitive impairments. Patient verbalized understanding and agreement and requested to go to f/u outpatient SLP services to maximize cognitive functioning. Patient left upright sitting EOB bed with all needs within reach. Continue with current plan of care.    Patient has met 2 of 6 long term goals.  Patient to discharge at [facility] level.   Reasons goals not met: Patient requires overall Min-Mod A multimodal cues for recall and complex problem solving    Clinical Impression/Discharge Summary: Patient has made functional gains and has met 2 of 6 LTGs this admission. Currently, patient demonstrates behaviors consistent with a Rancho Level VII and requires overall Min A multimodal cues to complete functional and mildly complex tasks safely in regards to problem solving and recall. However, patient did make cognitive gains in awareness and attention. Patient education complete. Patient will discharge home with 24 hour supervision from family. Patient would benefit from f/u outpatient SLP services to maximize his cognitive functioning and overall functional independence.   Care Partner:  Caregiver Able to Provide Assistance: Yes  Type of Caregiver Assistance: Cognitive  Recommendation:  Outpatient SLP;24 hour supervision/assistance  Rationale for SLP Follow Up: Maximize cognitive function and independence   Equipment:  N/A  Reasons for discharge: Discharged from hospital   Patient/Family Agrees with Progress Made and Goals Achieved: Yes    [place], [place] 03/10/2018, 3:34 PM

## 2018-03-10 NOTE — Progress Notes (Signed)
Physical Therapy Discharge Summary  Patient Details  Name: Alyssa Little MRN: [id number] Date of Birth: May 07, 1980  Today's Date: 03/10/2018 PT Individual Time: 1050-1145 PT Individual Time Calculation (min): 55 min   Pt's boyfriend present and able to demonstrate providing safe supervision throughout session. Pt performs community task of navigating to [employer] and buying a hot tea. Pt requires min cuing for path finding and organization of money and task.  Pt performs gait with supervision throughout hospital.  Standing balance with biodex with improvement with repetition, able to perform with 1 UE support.  Standing kinetron 5 x 1 minute for LE strength and endurance.  Significant amount of time spent on education with pt/boyfriend of recommendations for home safety, continued physical activity, and outpatient PT.  Both express understanding.  Patient has met 7 of 7 long term goals due to improved activity tolerance, improved balance, increased strength, ability to compensate for deficits and improved awareness.  Patient to discharge at an ambulatory level Supervision.   Patient's care partner is independent to provide the necessary supervision assistance at discharge.  Reasons goals not met: n/a  Recommendation:  Patient will benefit from ongoing skilled PT services in outpatient setting to continue to advance safe functional mobility, address ongoing impairments in balance, activity tolerance, strength, and minimize fall risk.  Equipment: No equipment provided  Reasons for discharge: treatment goals met and discharge from hospital  Patient/family agrees with progress made and goals achieved: Yes  PT Discharge Precautions/Restrictions Precautions Precautions: Fall Restrictions Weight Bearing Restrictions: No   Pain Pain Assessment Pain Scale: 0-10 Pain Score: 0-No pain  Cognition Overall Cognitive Status: Impaired/Different from baseline Orientation Level: Oriented  to person;Oriented to place;Oriented to time;Oriented to situation Focused Attention: Appears intact Memory: Impaired Memory Impairment: Decreased short term memory;Decreased recall of new information;Storage deficit Awareness: Impaired Awareness Impairment: Anticipatory impairment Reasoning: Impaired Reasoning Impairment: Functional complex;Verbal complex [employer] Scales of Cognitive Functioning: Purposeful/appropriate Sensation Sensation Light Touch: Appears Intact Proprioception: Appears Intact Coordination Gross Motor Movements are Fluid and Coordinated: Yes Fine Motor Movements are Fluid and Coordinated: Yes Motor  Motor Motor - Discharge Observations: improving strength  Mobility Bed Mobility Rolling Right: Independent Rolling Left: Independent Transfers Sit to Stand: Independent Stand to Sit: Independent Stand Pivot Transfers: Independent Transfer (Assistive device): None Locomotion  Gait Gait Assistance: Supervision/Verbal cueing Gait Distance (Feet): 250 Feet Assistive device: None Stairs / Additional Locomotion Stairs: Yes Stairs Assistance: Supervision/Verbal cueing Stair Management Technique: Two rails Number of Stairs: 16 Ramp: Supervision/Verbal cueing Curb: Supervision/Verbal cueing Wheelchair Mobility Wheelchair Mobility: No  Trunk/Postural Assessment  Cervical Assessment Cervical Assessment: Within Functional Limits Thoracic Assessment Thoracic Assessment: Within Functional Limits Lumbar Assessment Lumbar Assessment: Within Functional Limits Postural Control Righting Reactions: improved  Balance Dynamic Gait Index Level Surface: Mild Impairment Change in Gait Speed: Mild Impairment Gait with Horizontal Head Turns: Mild Impairment Gait with Vertical Head Turns: Mild Impairment Gait and Pivot Turn: Mild Impairment Step Over Obstacle: Mild Impairment Step Around Obstacles: Mild Impairment Steps: Mild Impairment Total Score:  16 Extremity Assessment      RLE Assessment General Strength Comments: grossly 3+/5 LLE Assessment General Strength Comments: grossly 3+/5    Anna-Marie Coller 03/10/2018, 11:37 AM

## 2018-03-10 NOTE — Discharge Summary (Signed)
NAME: Alyssa Little, Alyssa Little MEDICAL RECORD NF:[id number] ACCOUNT [id number] DATE OF BIRTH:Feb 04, 1981 FACILITY: [facility] LOCATION: [facility] PHYSICIAN:ZACHARY SWARTZ, MD  DISCHARGE SUMMARY  DATE OF DISCHARGE:  03/11/2018  DISCHARGE DIAGNOSES: 1.  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 2.  Seizure disorder. 3.  Alcohol use. 4.  Acute blood loss anemia.  HOSPITAL COURSE:  This is a 37 year old right-handed female with unremarkable past medical history other than alcohol use who lives alone.  She has a boyfriend.  One level home, independent prior to admission.  Presented 02/24/2018 after being found down  in a break room while at work convulsing bleeding from the scalp.  Urine drug screen negative.  Alcohol level of 11.  Cranial CT scan reviewed showing bilateral SAH.  Per report, small to moderate volume traumatic subarachnoid hemorrhage along both  sylvian fissure and the cerebral convexities.  Large anterior convexity scalp hematoma, no skull fracture.  CT cervical spine negative.  She did require intubation.  Continuous EEG showed no seizure.  Noted mild to moderate diffuse encephalopathy.   Neurosurgery consulted.  Initial plan for ventriculostomy.  Outpatient neurological status and improve.  Ventriculostomy was held.  Follow up cranial CT scan decreased subarachnoid hemorrhage.  Maintained on Keppra, Vimpat as well as Dilantin.  The  patient was admitted for comprehensive rehabilitation program.  PAST MEDICAL HISTORY:  See discharge diagnoses.  SOCIAL HISTORY:  Lives alone, supportive boyfriend and she does have family in the area.  FUNCTIONAL STATUS:  Upon admission to rehab services, moderate assist 200 feet 1 person handheld assist, minimal assist sit to stand, min mod assist with activities of daily living.  PHYSICAL EXAMINATION:   VITAL SIGNS:  Blood pressure 95/72, pulse 101, temperature 98, respirations 18. GENERAL:  Alert female in  no acute distress.  Provides her name and age.  She has some decreased awareness of her deficits. HEENT:  EOMs intact. NECK:  Supple, nontender, no JVD. CARDIOVASCULAR:  Rate controlled. ABDOMEN:  Soft, nontender, good bowel sounds. LUNGS:  Clear to auscultation without wheeze.  REHABILITATION HOSPITAL COURSE:  The patient was admitted to inpatient rehabilitation services.  Therapies initiated on a 3-hour daily basis, consisting of physical therapy, occupational therapy, speech therapy and rehabilitation nursing.  The following  issues were addressed during patient's rehabilitation stay:    Pertaining to the patient's traumatic SAH TBI remained stable.  She would continue on Vimpat, Keppra, and Dilantin for seizure disorder.  EEG negative.  No further seizure activity.  Noted long history of alcohol use.  The patient's family just received  full counseling regards to cessation of alcohol products.    The patient received weekly collaborative interdisciplinary team conferences to discuss estimated length of stay, family teaching, any barriers to discharge.  Ambulates supervision throughout the rehab unit, monitoring of safety and direction.  Dynamic  balance supervision.  Gathers belongings for activities of daily living and homemaking.  Speech therapy followup.  To facilitate medication management tasks.  Address functional problem solving and recall of her goals.  Again, it was discussed the need  for supervision for safety.  She was discharged to home.  DISCHARGE MEDICATIONS:  Included Vimpat 200 mg p.o. b.i.d., Keppra 1500 mg p.o. b.i.d., multivitamin daily, Dilantin 150 mg p.o. t.i.d.  DIET:  Her diet was regular.    FOLLOWUP:  She would follow up with Dr. Faith Rogue at the outpatient rehab service office as directed; Dr. Tressie Stalker, call for appointment; Dr. Jimmye Norman, call for appointment.  AN/NUANCE  D:03/10/2018 T:03/10/2018 JOB:[id number]

## 2018-03-11 ENCOUNTER — Inpatient Hospital Stay (HOSPITAL_COMMUNITY): Payer: Managed Care, Other (non HMO) | Admitting: Physical Therapy

## 2018-03-11 LAB — COMPREHENSIVE METABOLIC PANEL
ALBUMIN: 3.1 g/dL — AB (ref 3.5–5.0)
ALK PHOS: 93 U/L (ref 38–126)
ALT: 59 U/L — ABNORMAL HIGH (ref 0–44)
ANION GAP: 11 (ref 5–15)
AST: 77 U/L — ABNORMAL HIGH (ref 15–41)
BILIRUBIN TOTAL: 0.6 mg/dL (ref 0.3–1.2)
BUN: 6 mg/dL (ref 6–20)
CALCIUM: 9.5 mg/dL (ref 8.9–10.3)
CO2: 22 mmol/L (ref 22–32)
Chloride: 104 mmol/L (ref 98–111)
Creatinine, Ser: 0.49 mg/dL (ref 0.44–1.00)
GFR calc Af Amer: >60 mL/min (ref >60)
GFR calc non Af Amer: >60 mL/min (ref >60)
GLUCOSE: 83 mg/dL (ref 70–99)
Potassium: 4.2 mmol/L (ref 3.5–5.1)
Sodium: 137 mmol/L (ref 135–145)
TOTAL PROTEIN: 6.3 g/dL — AB (ref 6.5–8.1)

## 2018-03-11 LAB — PHENYTOIN LEVEL, TOTAL: Phenytoin Lvl: 3.6 ug/mL — ABNORMAL LOW (ref 10.0–20.0)

## 2018-03-11 MED ORDER — PHENYTOIN 125 MG/5ML PO SUSP: 150.0000 mg | Freq: Three times a day (TID) | ORAL | 12 refills | Status: DC

## 2018-03-11 MED ORDER — LEVETIRACETAM 750 MG PO TABS: 1500.0000 mg | ORAL_TABLET | Freq: Two times a day (BID) | ORAL | 1 refills | Status: DC

## 2018-03-11 MED ORDER — LACOSAMIDE 200 MG PO TABS: 200.0000 mg | ORAL_TABLET | Freq: Two times a day (BID) | ORAL | 1 refills | Status: DC

## 2018-03-11 MED ORDER — ADULT MULTIVITAMIN W/MINERALS CH: 1.0000 | ORAL_TABLET | Freq: Every day | ORAL | Status: DC

## 2018-03-11 MED ORDER — ACETAMINOPHEN 325 MG PO TABS: 325.0000 mg | ORAL_TABLET | ORAL | Status: DC | PRN

## 2018-03-11 NOTE — Progress Notes (Signed)
Patient discharged home, accompanied by boyfriend.

## 2018-03-11 NOTE — Progress Notes (Signed)
[place] PHYSICAL MEDICINE & REHABILITATION PROGRESS NOTE   Subjective/Complaints: Patient seen sitting up in bed this morning.  Family at bedside.  She states she is ready for discharge.  ROS: Denies CP, SOB, N/V/D  Objective:   No results found. No results for input(s): WBC, HGB, HCT, PLT in the last 72 hours. Recent Labs    03/11/18 0717  NA 137  K 4.2  CL 104  CO2 22  GLUCOSE 83  BUN 6  CREATININE 0.49  CALCIUM 9.5    Intake/Output Summary (Last 24 hours) at 03/11/2018 0929 Last data filed at 03/10/2018 1911 Gross per 24 hour  Intake 760 ml  Output -  Net 760 ml     Physical Exam: Vital Signs Blood pressure 98/66, pulse 73, temperature 98.1 F (36.7 C), temperature source Oral, resp. rate 18, height 5\' 2"  (1.575 m), weight 45.9 kg, SpO2 98 %. Constitutional: No distress . Vital signs reviewed. HENT: Normocephalic.  Atraumatic. Eyes: EOMI. No discharge. Cardiovascular: RRR.  No JVD. Respiratory: CTA bilaterally.  Normal effort. GI: BS +. Non-distended. Musc: No edema or tenderness in extremities. Neurological: Alert and oriented x3. Motor: Grossly 4+/5 throughout, improving Skin: Skin is warm and dry.  Psychiatric: cooperative. More focused   Assessment/Plan: 1. Functional deficits secondary to seizures/TBI which require 3+ hours per day of interdisciplinary therapy in a comprehensive inpatient rehab setting.  Physiatrist is providing close team supervision and 24 hour management of active medical problems listed below.  Physiatrist and rehab team continue to assess barriers to discharge/monitor patient progress toward functional and medical goals  Care Tool:  Bathing  Bathing activity did not occur: Refused Body parts bathed by patient: Right arm, Left arm, Chest, Abdomen, Right upper leg, Left upper leg, Right lower leg, Left lower leg, Face, Buttocks, Front perineal area         Bathing assist Assist Level: Independent     Upper Body  Dressing/Undressing Upper body dressing   What is the patient wearing?: Pull over shirt    Upper body assist Assist Level: Independent    Lower Body Dressing/Undressing Lower body dressing      What is the patient wearing?: Underwear/pull up, Pants     Lower body assist Assist for lower body dressing: Independent     Toileting Toileting    Toileting assist Assist for toileting: Independent     Transfers Chair/bed transfer  Transfers assist     Chair/bed transfer assist level: Independent     Locomotion Ambulation   Ambulation assist      Assist level: Independent Assistive device: Hand held assist Max distance: 100   Walk 10 feet activity   Assist     Assist level: Independent     Walk 50 feet activity   Assist    Assist level: Supervision/Verbal cueing      Walk 150 feet activity   Assist    Assist level: Supervision/Verbal cueing      Walk 10 feet on uneven surface  activity   Assist     Assist level: Supervision/Verbal cueing     Wheelchair     Assist Will patient use wheelchair at discharge?: No             Wheelchair 50 feet with 2 turns activity    Assist            Wheelchair 150 feet activity     Assist           Medical Problem List  and Plan: 1.   Decreased functional mobility and mental status secondary to seizure and traumatic SAH/TBI  DC today 2.  DVT Prophylaxis/Anticoagulation: SCDs. Monitor for any signs of DVT 3. Pain Management:  Tylenol as needed 4. Mood:  Provide emotional support, environmental mods, education 5. Neuropsych:  Provide support as available.  6. Skin/Wound Care:  Routine skin checks 7. Fluids/Electrolytes/Nutrition: encourage PO 8. Seizure disorder. Dilantin 150 mg 3 times a day, Keppra 1500 mg twice a day, Vimpat 200 mg twice daily  -no seizure activity  -follow up with neuro as outpt to decide on ongoing med regimen 9. Alcohol use. Alcohol level of 11 on  discharge. Provide counseling when appropriate 10. ABLA---no supplement, dietary iron  Hemoglobin 11.5 on 11/21  Continue to monitor 11.  Transaminitis  LFTs elevated on 11/21  Slowly improving on 11/26    LOS: 6 days A FACE TO FACE EVALUATION WAS PERFORMED  Landy Mace [name] 03/11/2018, 9:29 AM

## 2018-03-11 NOTE — Progress Notes (Signed)
Social Work  Discharge Note  The overall goal for the admission was met for:   Discharge location: Yes - home with boyfriend and friend who can provide close to 24/7 supervision  Length of Stay: Yes - 6 days  Discharge activity level: Yes - supervision overall  Home/community participation: Yes  Services provided included: MD, RD, PT, OT, SLP, RN, TR, Pharmacy and Sac: Private Insurance: [employer]  Follow-up services arranged: Outpatient: PT, OT, ST via [facility] and Patient/Family has no preference for HH/DME agencies  Comments (or additional information):  Patient/Family verbalized understanding of follow-up arrangements: Yes  Individual responsible for coordination of the follow-up plan: pt  Confirmed correct DME delivered: NA Nikolay Demetriou

## 2018-03-11 NOTE — Patient Care Conference (Signed)
Inpatient RehabilitationTeam Conference and Plan of Care Update Date: 03/11/2018   Time: 4:29 PM    Patient Name: Alyssa Little      Medical Record Number: [id number]  Date of Birth: Feb 25, 1981 Sex: Female         Room/Bed: 4W05C/4W05C-01 Payor Info: Payor: CIG[employer] / Plan: CIGNA MANAGED / Product Type: *No Product type* /    Admitting Diagnosis: TBI SEIZURES  Admit Date/Time:  03/05/2018  4:27 PM Admission Comments: No comment available   Primary Diagnosis:  <principal problem not specified> Principal Problem: <principal problem not specified>  Patient Active Problem List   Diagnosis Date Noted  . Transaminitis   . TBI (traumatic brain injury) ([place]) 03/05/2018  . ETOH abuse   . Tachypnea   . Traumatic brain injury with loss of consciousness ([place])   . Sinus tachycardia   . Seizures ([place])   . Acute blood loss anemia   . Malnutrition of moderate degree 02/26/2018  . SAH (subarachnoid hemorrhage) ([place]) 02/24/2018    Expected Discharge Date: Expected Discharge Date: 03/11/18  Team Members Present: Physician leading conference: Dr. Delice Lesch Nurse Present: Dorien Chihuahua, RN PT Present: Roderic Ovens, PT OT Present: Cherylynn Ridges, OT SLP Present: Weston Anna, SLP PPS Coordinator present : Gunnar Fusi, SLP)     Current Status/Progress Goal Weekly Team Focus  Medical   Decreased functional mobility and mental status secondary to seizure and traumatic SAH/TBI  Improve mobility, cognition, self-care, endurance, LFTs  See above   Bowel/Bladder   continent of B+B         Swallow/Nutrition/ Hydration             ADL's   mod I dressing, (S) level bathing standing  mod I with 24/7 (S) for safety  safety awareness, cognitive remediation with focus on memory strategies, dynamic standing balance   Mobility   supervision with cues for pathfinding with gait, supervison-mod I for transfers.   mod I with 24/7 supervision for safety  gait, balance, pathfinding,  safety awareness, pt/family education.    Communication             Safety/Cognition/ Behavioral Observations  Supervision-Min A  Supervision  D/C home today with 24 hour supervision    Pain   NA         Skin   NA            *See Care Plan and progress notes for long and short-term goals.     Barriers to Discharge  Current Status/Progress Possible Resolutions Date Resolved   Physician    Medical stability     See above  Making good progress with therapies, plan for discharge today      Nursing                  PT                    OT                  SLP                SW                Discharge Planning/Teaching Needs:  Pt to d/c home with boyfriend and friend able to provide close to 24/ 7 supervision  NA   Team Discussion:  Pt has met supervision goals and ready for d/c today.  Education completed with boyfriend yesterday.  Plan for  OPPT, OT, ST  Revisions to Treatment Plan:  NA    Continued Need for Acute Rehabilitation Level of Care: The patient requires daily medical management by a physician with specialized training in physical medicine and rehabilitation for the following conditions: Daily direction of a multidisciplinary physical rehabilitation program to ensure safe treatment while eliciting the highest outcome that is of practical value to the patient.: Yes Daily medical management of patient stability for increased activity during participation in an intensive rehabilitation regime.: Yes Daily analysis of laboratory values and/or radiology reports with any subsequent need for medication adjustment of medical intervention for : Neurological problems;Other   I attest that I was present, lead the team conference, and concur with the assessment and plan of the team.   Cohl Behrens 03/11/2018, 4:29 PM

## 2018-03-11 NOTE — Discharge Instructions (Signed)
Inpatient Rehab Discharge Instructions  Alyssa LacyChristina Danielle Little Discharge date and time: No discharge date for patient encounter.   Activities/Precautions/ Functional Status: Activity: activity as tolerated Diet: regular diet Wound Care: none needed Functional status:  ___ No restrictions     ___ Walk up steps independently ___ 24/7 supervision/assistance   ___ Walk up steps with assistance ___ Intermittent supervision/assistance  ___ Bathe/dress independently ___ Walk with walker     _x__ Bathe/dress with assistance ___ Walk Independently    ___ Shower independently ___ Walk with assistance    ___ Shower with assistance ___ No alcohol     ___ Return to work/school ________     COMMUNITY REFERRALS UPON DISCHARGE:    Outpatient: PT     OT    ST                  Agency:  [facility]   Phone: [phone]               Appointment Date/Time:  12/10 @ 2:00 pm;  12/12 @ 1:15 pm   GENERAL COMMUNITY RESOURCES FOR PATIENT/FAMILY:  Support Groups:  [place] [facility] (see handout for meeting information)      Special Instructions:  No driving, alcohol or smoking  My questions have been answered and I understand these instructions. I will adhere to these goals and the provided educational materials after my discharge from the hospital.  Patient/Caregiver Signature _______________________________ Date __________  Clinician Signature _______________________________________ Date __________  Please bring this form and your medication list with you to all your follow-up doctor's appointments.

## 2018-03-25 ENCOUNTER — Other Ambulatory Visit: Payer: Self-pay

## 2018-03-25 ENCOUNTER — Ambulatory Visit: Payer: Managed Care, Other (non HMO) | Attending: Physical Medicine & Rehabilitation | Admitting: Occupational Therapy

## 2018-03-25 ENCOUNTER — Encounter: Payer: Self-pay | Admitting: Occupational Therapy

## 2018-03-25 DIAGNOSIS — R41842 Visuospatial deficit: Secondary | ICD-10-CM

## 2018-03-25 DIAGNOSIS — R41841 Cognitive communication deficit: Secondary | ICD-10-CM | POA: Diagnosis present

## 2018-03-25 DIAGNOSIS — R208 Other disturbances of skin sensation: Secondary | ICD-10-CM | POA: Insufficient documentation

## 2018-03-25 DIAGNOSIS — R2689 Other abnormalities of gait and mobility: Secondary | ICD-10-CM | POA: Insufficient documentation

## 2018-03-25 DIAGNOSIS — M6281 Muscle weakness (generalized): Secondary | ICD-10-CM | POA: Diagnosis present

## 2018-03-25 DIAGNOSIS — R41840 Attention and concentration deficit: Secondary | ICD-10-CM | POA: Diagnosis present

## 2018-03-25 DIAGNOSIS — R42 Dizziness and giddiness: Secondary | ICD-10-CM | POA: Insufficient documentation

## 2018-03-25 DIAGNOSIS — R41844 Frontal lobe and executive function deficit: Secondary | ICD-10-CM | POA: Diagnosis present

## 2018-03-27 ENCOUNTER — Ambulatory Visit: Payer: Managed Care, Other (non HMO)

## 2018-03-27 ENCOUNTER — Other Ambulatory Visit: Payer: Self-pay

## 2018-03-27 DIAGNOSIS — R41844 Frontal lobe and executive function deficit: Secondary | ICD-10-CM | POA: Diagnosis not present

## 2018-03-27 DIAGNOSIS — R208 Other disturbances of skin sensation: Secondary | ICD-10-CM

## 2018-03-27 DIAGNOSIS — R41841 Cognitive communication deficit: Secondary | ICD-10-CM

## 2018-03-27 DIAGNOSIS — R42 Dizziness and giddiness: Secondary | ICD-10-CM

## 2018-03-27 DIAGNOSIS — R2689 Other abnormalities of gait and mobility: Secondary | ICD-10-CM

## 2018-03-27 DIAGNOSIS — M6281 Muscle weakness (generalized): Secondary | ICD-10-CM

## 2018-03-27 NOTE — Patient Instructions (Signed)
Play any games you would like with strategy - card games or board games
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
## 2018-03-27 NOTE — Addendum Note (Signed)
Addended by: Willa FraterFREEMAN, Arrow Emmerich D on: 03/27/2018 07:19 PM   Modules accepted: Orders

## 2018-03-28 ENCOUNTER — Telehealth: Payer: Self-pay

## 2018-03-28 NOTE — Telephone Encounter (Signed)
Pt called stating she her HR dept needs more information for her FMLA.

## 2018-03-28 NOTE — Therapy (Signed)
[facility] [address] [place], [place], [address] Phone: [phone]   Fax:  [phone]  Physical Therapy Evaluation  Patient Details  Name: Alyssa Little MRN: [id number] Date of Birth: [birth year] Referring Provider (PT): Dr. Riley Kill   Encounter Date: 03/27/2018  PT End of Session - 03/28/18 1245    Visit Number  1    Number of Visits  9    Date for PT Re-Evaluation  04/27/18    Authorization Type  [employer]    PT Start Time  1320    PT Stop Time  1359    PT Time Calculation (min)  39 min    Equipment Utilized During Treatment  --   min guard to S prn   Activity Tolerance  Patient tolerated treatment well    Behavior During Therapy  [facility] for tasks assessed/performed       History reviewed. No pertinent past medical history.  History reviewed. No pertinent surgical history.  There were no vitals filed for this visit.   Subjective Assessment - 03/27/18 1326    Subjective  Pt hospitalized, 02/24/18-03/10/18 s/p fall which resulted in traumatic SAH, with TBI and seizures. Pt reported she's slowly getting better s/p hospitalization. She feels dizzy upon standing that lasts seconds but is improving. Pt reported B foot pain at rest which improved with movement, that felt like barbed wire around her toes. Pt reports balance is improving but feels intermittent unsteadiness. She would like to improve her balance and strength.    Patient is accompained by:  --   boyfriend: [name]   Pertinent History  ETOH abuse per chart    Patient Stated Goals  I want to get back to "as normal as I can".     Currently in Pain?  Yes    Pain Score  2     Pain Location  Head    Pain Orientation  Right    Pain Descriptors / Indicators  Aching    Pain Type  Acute pain    Pain Onset  1 to 4 weeks ago    Pain Frequency  Constant    Aggravating Factors   pulling hair from wound, lying down with pressure on head    Pain Relieving  Factors  time, Tylenol          [facility] PT Assessment - 03/27/18 1335      Assessment   Medical Diagnosis  Traumatic SAH, resulting in TBI, seizures    Referring Provider (PT)  Dr. Riley Kill    Onset Date/Surgical Date  02/24/18    Hand Dominance  Right    Next MD Visit  04/22/18    Prior Therapy  hospitalized 02/24/18-03/10/18      Precautions   Precautions  Fall   based on FGA score.    Precaution Comments  No driving      Restrictions   Weight Bearing Restrictions  No      Balance Screen   Has the patient fallen in the past 6 months  No    Has the patient had a decrease in activity level because of a fear of falling?   Yes    Is the patient reluctant to leave their home because of a fear of falling?   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       Coordination   Gross Motor Movements are Fluid and Coordinated  Yes    Fine Motor Movements are Fluid and Coordinated  Yes    Heel Shin Test  WNL      Posture/Postural Control   Posture/Postural Control  Postural limitations    Postural Limitations  Forward head      Tone    Assessment Location  --   pt denied spasms     ROM / Strength   AROM / PROM / Strength  AROM;Strength      AROM   Overall AROM   Within functional limits for tasks performed    Overall AROM Comments  BLEs AROM WNL      Strength   Overall Strength  Deficits    Overall Strength Comments  BLE: 4+/5  except for 3+/5 hip flexion and hip abd in seated position      Bed Mobility   Bed Mobility  Not assessed   pt reported bed mobility not an issue     Transfers   Transfers  Sit to Stand;Stand to Sit    Sit to Stand  7: Independent;Without upper extremity assist;From chair/3-in-1    Stand to Sit  7: Independent;Without upper extremity assist;To chair/3-in-1      Ambulation/Gait   Ambulation/Gait  Yes    Ambulation/Gait Assistance  5: Supervision    Ambulation/Gait Assistance Details  S to ensure safety.     Ambulation Distance (Feet)  300 Feet    Assistive device  None    Gait Pattern  Step-through pattern;Within Functional Limits    Ambulation Surface  Level;Indoor    Gait velocity  4.5906ft/sec. no AD    Gait Comments  PT will assess gait over uneven terrain next session.       Functional Gait  Assessment   Gait assessed   Yes    Gait Level Surface  Walks 20 ft in less than 7 sec but greater than 5.5 sec, uses assistive device, slower speed, mild gait deviations, or deviates 6-10 in outside of the 12 in walkway width.   5.77 sec.    Change in Gait Speed  Able to smoothly change walking speed without loss of balance or gait deviation. Deviate no more than 6 in outside of the 12 in walkway width.    Gait with Horizontal Head Turns  Performs head turns smoothly with no change in gait. Deviates no more than 6 in outside 12 in walkway width    Gait with Vertical Head Turns  Performs head turns with no change in gait. Deviates no more than 6 in outside 12  in walkway width.    Gait and Pivot Turn  Pivot turns safely within 3 sec and stops quickly with no loss of balance.    Step Over  Obstacle  Is able to step over 2 stacked shoe boxes taped together (9 in total height) without changing gait speed. No evidence of imbalance.    Gait with Narrow Base of Support  Ambulates less than 4 steps heel to toe or cannot perform without assistance.    Gait with Eyes Closed  Walks 20 ft, uses assistive device, slower speed, mild gait deviations, deviates 6-10 in outside 12 in walkway width. Ambulates 20 ft in less than 9 sec but greater than 7 sec.    Ambulating Backwards  Walks 20 ft, uses assistive device, slower speed, mild gait deviations, deviates 6-10 in outside 12 in walkway width.    Steps  Alternating feet, must use rail.    Total Score  23    FGA comment:  23/30: indicates pt is at medium falls risk.                 Objective measurements completed on examination: See above findings.              PT Education - 03/28/18 1244    Education Details  PT discussed POC, frequency, and duration. PT educated on outcome measure results.     Person(s) Educated  Patient;Other (comment)   boyfriend: Cindee Lame   Methods  Explanation    Comprehension  Verbalized understanding       PT Short Term Goals - 03/28/18 1252      PT SHORT TERM GOAL #1   Title  same as LTGs        PT Long Term Goals - 03/28/18 1252      PT LONG TERM GOAL #1   Title  Pt will be IND in HEP to improve strength, balance, and dizziness. TARGET DATE FOR ALL LTGS: 04/25/18    Status  New      PT LONG TERM GOAL #2   Title  Pt will improve FGA score to 30/30 to decr. falls risk.     Status  New      PT LONG TERM GOAL #3   Title  Perform gait over uneven terrain and write goal as indicated.     Status  New      PT LONG TERM GOAL #4   Title  Pt will report return to yoga to improve QOL and maintain gains made in PT.     Status  New      PT LONG TERM GOAL #5   Title  Monitor dizziness and write goals as indicated.     Status  New             Plan - 03/28/18 1246    Clinical  Impression Statement  Pt is a pleasant 37y/o female presenting to OPPT neuro s/p fall and hospitalization for traumatic SAH resuting in TBI and seizures. Pt's PMH significant for the following: ETOH abuse per chart. Pt's gait speed was WNL. However, PT will assess gait over uneven terrain next session as pt reported incr. difficulty traversing uneven terrain. Pt's FGA score indicates pt is at a moderate risk for falls. The following impaiments were noted upon exam: gait deviations during dynamic gait activities, impaired balance, decr. strength, intermittent dizziness during STS txf and impaired cognition. Pt would benefit from skilled PT to improve safety during functional mobility.  History and Personal Factors relevant to plan of care:  Pt unable to work full-time right now (typically works full-time at [employer]) and job requires pt to be active on feet, has supportive boyfriend and neighbors to assist pt    Clinical Presentation  Stable    Clinical Presentation due to:  ETOH abuse per chart    Clinical Decision Making  Low    Rehab Potential  Good    Clinical Impairments Affecting Rehab Potential  see above    PT Frequency  2x / week    PT Duration  4 weeks    PT Treatment/Interventions  ADLs/Self Care Home Management;Biofeedback;Canalith Repostioning;Functional mobility training;Stair training;Gait training;Patient/family education;Orthotic Fit/Training;Cognitive remediation;DME Instruction;Neuromuscular re-education;Balance training;Therapeutic exercise;Therapeutic activities;Manual techniques;Vestibular    PT Next Visit Plan  Assess gait outdoors over uneven terrain and write goal as indicated, establish a balance/strengthening HEP.     Consulted and Agree with Plan of Care  Patient;Family member/caregiver    Family Member Consulted  [name]: boyfriend       Patient will benefit from skilled therapeutic intervention in order to improve the following deficits and impairments:  Abnormal gait,  Decreased endurance, Postural dysfunction, Impaired sensation, Decreased cognition, Decreased strength, Decreased mobility, Decreased balance, Decreased knowledge of use of DME, Pain, Dizziness(PT will not formally address pain but will monitor closely. )  Visit Diagnosis: Other abnormalities of gait and mobility - Plan: PT plan of care cert/re-cert  Muscle weakness (generalized) - Plan: PT plan of care cert/re-cert  Dizziness and giddiness - Plan: PT plan of care cert/re-cert  Other disturbances of skin sensation - Plan: PT plan of care cert/re-cert     Problem List Patient Active Problem List   Diagnosis Date Noted  . Transaminitis   . TBI (traumatic brain injury) (HCC) 03/05/2018  . ETOH abuse   . Tachypnea   . Traumatic brain injury with loss of consciousness (HCC)   . Sinus tachycardia   . Seizures (HCC)   . Acute blood loss anemia   . Malnutrition of moderate degree 02/26/2018  . SAH (subarachnoid hemorrhage) (HCC) 02/24/2018    Miller,Jennifer L 03/28/2018, 12:55 PM  [facility] [address] [place], [place], [address] Phone: [phone]   Fax:  [phone]  Name: Alyssa Little MRN: [id number] Date of Birth: 23-Sep-1980   Zerita Boers, PT,DPT 03/28/18 12:55 PM Phone: [phone] Fax: [phone]

## 2018-03-31 ENCOUNTER — Telehealth: Payer: Self-pay | Admitting: Physical Medicine & Rehabilitation

## 2018-03-31 NOTE — Telephone Encounter (Signed)
Received voicemail on referral line that patient needs to talk about her fmla documents - called her number "not accepting calls at this time"  No message could be left.

## 2018-04-03 ENCOUNTER — Encounter: Payer: Self-pay | Admitting: Occupational Therapy

## 2018-04-03 ENCOUNTER — Ambulatory Visit: Payer: Managed Care, Other (non HMO) | Admitting: Occupational Therapy

## 2018-04-03 ENCOUNTER — Ambulatory Visit: Payer: Managed Care, Other (non HMO) | Admitting: Speech Pathology

## 2018-04-03 DIAGNOSIS — M6281 Muscle weakness (generalized): Secondary | ICD-10-CM

## 2018-04-03 DIAGNOSIS — R41842 Visuospatial deficit: Secondary | ICD-10-CM

## 2018-04-03 DIAGNOSIS — R2689 Other abnormalities of gait and mobility: Secondary | ICD-10-CM

## 2018-04-03 DIAGNOSIS — R41844 Frontal lobe and executive function deficit: Secondary | ICD-10-CM

## 2018-04-03 DIAGNOSIS — R41840 Attention and concentration deficit: Secondary | ICD-10-CM
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
## 2018-04-03 NOTE — Patient Instructions (Signed)
Memory Compensation Strategies  1. Use "WARM" strategy. W= write it down A=  associate it R=  repeat it M=  make a mental picture  2. You can keep a Memory Notebook. Use a 3-ring notebook with sections for the following:  calendar, important names and phone numbers, medications, doctors' names/phone numbers, "to do list"/reminders, and a section to journal what you did each day  3. Use a calendar to write appointments down.  4. Write yourself a schedule for the day/make a routine This can be placed on the calendar or in a separate section of the Memory Notebook.  Keeping a regular schedule can help memory.  5. Use medication organizer with sections for each day or morning/evening pills  You may need help loading it  6. Keep a basket, or pegboard by the door.   Place items that you need to take out with you in the basket or on the pegboard.  You may also want to include a message board for reminders.  7. Use sticky notes. Place sticky notes with reminders in a place where the task is performed.  For example:  "turn off the stove" placed by the stove, "lock the door" placed on the door at eye level, "take your medications" on the bathroom mirror or by the place where you normally take your medications  8. Use alarms/timers.  Use while cooking to remind yourself to check on food or as a reminder to take your medicine, or as a reminder to make a call, or as a reminder to perform another task, etc.  9. Use a voice memo to record important information and notes for yourself.  10.  Organize and reduce clutter  

## 2018-04-04 ENCOUNTER — Encounter

## 2018-04-07 ENCOUNTER — Ambulatory Visit: Payer: Managed Care, Other (non HMO) | Admitting: Speech Pathology

## 2018-04-11 ENCOUNTER — Encounter

## 2018-04-14 ENCOUNTER — Ambulatory Visit: Payer: Managed Care, Other (non HMO) | Admitting: Occupational Therapy

## 2018-04-14 ENCOUNTER — Ambulatory Visit: Payer: Managed Care, Other (non HMO) | Admitting: Speech Pathology

## 2018-04-14 ENCOUNTER — Encounter: Payer: Self-pay | Admitting: Occupational Therapy

## 2018-04-14 ENCOUNTER — Encounter: Payer: Self-pay | Admitting: Physical Therapy

## 2018-04-14 ENCOUNTER — Ambulatory Visit: Payer: Managed Care, Other (non HMO) | Admitting: Physical Therapy

## 2018-04-14 DIAGNOSIS — R41844 Frontal lobe and executive function deficit: Secondary | ICD-10-CM

## 2018-04-14 DIAGNOSIS — R41842 Visuospatial deficit: Secondary | ICD-10-CM

## 2018-04-14 DIAGNOSIS — M6281 Muscle weakness (generalized): Secondary | ICD-10-CM

## 2018-04-14 DIAGNOSIS — R41841 Cognitive communication deficit: Secondary | ICD-10-CM

## 2018-04-14 DIAGNOSIS — R208 Other disturbances of skin sensation: Secondary | ICD-10-CM

## 2018-04-14 DIAGNOSIS — R2689 Other abnormalities of gait and mobility: Secondary | ICD-10-CM

## 2018-04-14 DIAGNOSIS — R41840 Attention and concentration deficit: Secondary | ICD-10-CM

## 2018-04-14 DIAGNOSIS — R42 Dizziness and giddiness: Secondary | ICD-10-CM

## 2018-04-14 NOTE — Patient Instructions (Signed)
Strengthening: Resisted Flexion   Attach tube to door.  Hold tubing with one arm at side. Pull forward and up with elbow straight. Move shoulder through pain-free range of motion, no further than shoulder height. Repeat 10 times per set.  Do 1-2 sessions per day.    Strengthening: Resisted Extension   Attach one end to door.  Hold tubing in one hand, arm forward. Pull arm back, elbow straight. Repeat 10 times per set. Do 1-2 sessions per day.   Resisted Horizontal Abduction: Bilateral   Sit or stand, tubing in both hands, palms down and arms out in front. Keeping arms straight, pinch shoulder blades together and stretch arms out. Hold at belly button level.   Repeat 10 times per set.  Do 1-2 sessions per day.

## 2018-04-14 NOTE — Therapy (Signed)
[facility] [address] [place], [place], [address] Phone: [phone]   Fax:  [phone]  Physical Therapy Treatment  Patient Details  Name: Alyssa LacyChristina Danielle Little MRN: [id number] Date of Birth: 05/12/1980 Referring Provider (PT): Dr. Riley KillSwartz   Encounter Date: 04/14/2018  PT End of Session - 04/14/18 1013    Visit Number  2    Number of Visits  9    Date for PT Re-Evaluation  04/27/18    Authorization Type  [employer]    PT Start Time  [phone]    PT Stop Time  1005    PT Time Calculation (min)  40 min    Equipment Utilized During Treatment  --   min guard to S prn   Activity Tolerance  Patient tolerated treatment well    Behavior During Therapy  [facility] for tasks assessed/performed       History reviewed. No pertinent past medical history.  History reviewed. No pertinent surgical history.  There were no vitals filed for this visit.  Subjective Assessment - 04/14/18 0924    Subjective  "I feel like I'm getting stronger".  Pt reports getting outside a little more.    Patient is accompained by:  --   boyfriend: [name]   Pertinent History  ETOH abuse per chart    Patient Stated Goals  I want to get back to "as normal as I can".     Currently in Pain?  No/denies    Pain Onset  1 to 4 weeks ago                       [facility]/Exercise - 04/14/18 0001      Bed Mobility   Bed Mobility  Not assessed      Transfers   Transfers  Sit to Stand;Stand to Sit    Sit to Stand  7: Independent    Stand to Sit  7: Independent      Ambulation/Gait   Ambulation/Gait  Yes    Ambulation/Gait Assistance  5: Supervision    Ambulation/Gait Assistance Details  Supervision for safety/assessment only otherwise Independent    Ambulation Distance (Feet)  600 Feet   plus   Assistive device  None    Gait Pattern  Step-through pattern;Within Functional Limits    Ambulation Surface   Level;Unlevel;Indoor;Outdoor;Paved      High Level Balance   High Level Balance Activities  Backward walking;Tandem walking;Other (comment)   backward tandem walking         Balance Exercises - 04/14/18 1009      Balance Exercises: Standing   Standing Eyes Opened  Narrow base of support (BOS);Wide (BOA);Foam/compliant surface;Head turns;Other reps (comment)   10 each   Standing Eyes Closed  Narrow base of support (BOS);Wide (BOA);Head turns;Foam/compliant surface;Other reps (comment);Other (comment)   10 reps each   Tandem Stance  Eyes open;Intermittent upper extremity support;5 reps;10 secs    SLS  Eyes open;Solid surface;Intermittent upper extremity support;5 reps;10 secs    Step Ups  Forward;6 inch;Intermittent UE support   15 reps each side   Cone Rotation  Solid surface;A/P;Other (comment)   also taps and double taps to cone   Other Standing Exercises  Step down with 1 UE support x 15 reps off 6" step;taps to 6" step intermeittent UE support then to 6", 12", 6" intermittent UE support        PT Education - 04/14/18 1012  Education Details  HEP    Person(s) Educated  Patient    Methods  Explanation;Demonstration;Handout    Comprehension  Verbalized understanding       PT Short Term Goals - 03/28/18 1252      PT SHORT TERM GOAL #1   Title  same as LTGs        PT Long Term Goals - 03/28/18 1252      PT LONG TERM GOAL #1   Title  Pt will be IND in HEP to improve strength, balance, and dizziness. TARGET DATE FOR ALL LTGS: 04/25/18    Status  New      PT LONG TERM GOAL #2   Title  Pt will improve FGA score to 30/30 to decr. falls risk.     Status  New      PT LONG TERM GOAL #3   Title  Perform gait over uneven terrain and write goal as indicated.     Status  New      PT LONG TERM GOAL #4   Title  Pt will report return to yoga to improve QOL and maintain gains made in PT.     Status  New      PT LONG TERM GOAL #5   Title  Monitor dizziness and write  goals as indicated.     Status  New            Plan - 04/14/18 1014    Clinical Impression Statement  Skilled session focused on high level balance, gait and estabilishing HEP to address same.  Pt with improved gait and balance since initial evaluation.  Continue PT per POC.    Rehab Potential  Good    Clinical Impairments Affecting Rehab Potential  see above    PT Frequency  2x / week    PT Duration  4 weeks    PT Treatment/Interventions  ADLs/Self Care Home Management;Biofeedback;Canalith Repostioning;Functional mobility training;Stair training;Gait training;Patient/family education;Orthotic Fit/Training;Cognitive remediation;DME Instruction;Neuromuscular re-education;Balance training;Therapeutic exercise;Therapeutic activities;Manual techniques;Vestibular    PT Next Visit Plan  Review HEP.  High level balance on compliant surfaces.  Discuss additional PT visits vs discharge.    PT Home Exercise Plan  [email or web address]    Consulted and Agree with Plan of Care  Patient       Patient will benefit from skilled therapeutic intervention in order to improve the following deficits and impairments:  Abnormal gait, Decreased endurance, Postural dysfunction, Impaired sensation, Decreased cognition, Decreased strength, Decreased mobility, Decreased balance, Decreased knowledge of use of DME, Pain, Dizziness(PT will not formally address pain but will monitor closely. )  Visit Diagnosis: Other abnormalities of gait and mobility  Muscle weakness (generalized)  Dizziness and giddiness  Other disturbances of skin sensation     Problem List Patient Active Problem List   Diagnosis Date Noted  . Transaminitis   . TBI (traumatic brain injury) (H[facility]) 03/05/2018  . ETOH abuse   . Tachypnea   . Traumatic brain injury with loss of consciousness (H[facility])   . Sinus tachycardia   . Seizures ([facility])   . Acute blood loss anemia   . Malnutrition of moderate degree 02/26/2018  . SAH (subarachnoid hemorrhage)  (H[facility]) 02/24/2018    Alyssa Little , [place] [facility] 04/14/18 10:17 AM Phone: [phone] Fax: [phone]   [facility] [address] [place], [place], [address] Phone: [phone]   Fax:  [phone]  Name: Alyssa LacyChristina Danielle Ramaswamy MRN: [id number] Date of Birth: 03/15/1981

## 2018-04-14 NOTE — Patient Instructions (Signed)
Access Code: [email or web address]  URL: [email or web address]  Date: 04/14/2018  Prepared by: Modena Morrowenise Robertson   Exercises  Walking Tandem Stance - 10 reps - 3 sets - 1x daily - 7x weekly  Single Leg Stance - 10 reps - 3 sets - 1x daily - 7x weekly  Backwards Walking - 10 reps - 3 sets - 1x daily - 7x weekly  Tandem Stance - 10 reps - 3 sets - 1x daily - 7x weekly  Backward Tandem Walking - 10 reps - 3 sets - 1x daily - 7x weekly  Narrow Stance with Eyes Closed and Head Rotation on Foam Pad - 10 reps - 3 sets - 1x daily - 7x weekly  Wide Stance with Eyes Closed and Head Rotation on Foam Pad - 10 reps - 3 sets - 1x daily - 7x weekly

## 2018-04-14 NOTE — Therapy (Signed)
[facility] [address] [place], [place], [address] Phone: [phone]   Fax:  [phone]  Speech Language Pathology Treatment  Patient Details  Name: Alyssa LacyChristina Danielle Little MRN: [id number] Date of Birth: 05/21/1980 Referring Provider (SLP): Faith RogueSwartz, Zachary, MD   Encounter Date: 04/14/2018  End of Session - 04/14/18 1344    Visit Number  2    Number of Visits  17    Date for SLP Re-Evaluation  06/25/18   90 days   Authorization Type  16 visits approved 03/27/18-09/26/18    Authorization - Visit Number  1    Authorization - Number of Visits  16    SLP Start Time  1015    SLP Stop Time   1100    SLP Time Calculation (min)  45 min    Activity Tolerance  Patient tolerated treatment well       No past medical history on file.  No past surgical history on file.  There were no vitals filed for this visit.  Subjective Assessment - 04/14/18 1017    Subjective  "I think a lot of things are improving."    Currently in Pain?  No/denies            ADULT SLP TREATMENT - 04/14/18 1015      General Information   Behavior/Cognition  Alert;Cooperative;Pleasant mood      Treatment Provided   Treatment provided  Cognitive-Linquistic      Pain Assessment   Pain Assessment  No/denies pain      Cognitive-Linquistic Treatment   Treatment focused on  Cognition    Skilled Treatment  SLP provided education re: findings of cognitive-linguistic testing, deficit areas, proposed therapy goals. Pt stated she feels she has been improving cognitively, is hopeful to return to work in February. SLP worked with pt on attention, awareness in functional task generating list of responsibilities at work across several categories. SLP showed pt chart with categories of responsibilities, and pt agreed she had duties in each category. When asked to list specific tasks, pt stared at the paper, eventually requiring mod question  cues/examples to initiate the task and to list relevant items. Several of pt's responses were disorganized or vague, with clarficication needed for most of her responses. 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 SLP told pt that given her cognitive difficulties with executive function, she is likely to have difficulty organizing and prioritizing her duties at work, and attention to detail could impact accuracy in verifying daily income reports. Pt agreed, but had difficulty generating strategies/adjustments that might improve her success. Pt to revise her list of work duties, prioritizing her specific job duties and identifying tasks she should delegate to her employees.       Assessment / Recommendations / Plan   Plan  Continue with current plan of care      Progression Toward Goals   Progression toward goals  Progressing toward goals       SLP Education - 04/14/18 1347    Education Details  deficit areas    Person(s) Educated  Patient    Methods  Explanation    Comprehension  Verbalized understanding;Need further instruction       SLP Short Term Goals - 04/14/18 1346      SLP SHORT TERM GOAL #1   Title  pt will demo the use of a memory compensation that is functional for  her between 3 sessions    Time  4    Period  Weeks    Status  On-going      SLP SHORT TERM GOAL #2   Title  pt will tell SLP 3 of her cognitive communicative deficits    Time  4    Period  Weeks    Status  On-going      SLP SHORT TERM GOAL #3   Title  pt will complete functional executive function tasks with no more than min A occasionally over three sessions    Time  4    Period  Weeks    Status  On-going      SLP SHORT TERM GOAL #4   Title  pt will demo organization and planning adequate for simple to mod complex tasks in therapy, 90% accuracy after self-correction/double-checking    Time  4    Period  Weeks    Status  On-going        SLP Long Term Goals - 04/14/18 1346      SLP LONG TERM GOAL #1   Title  pt will demo executive function in mod complex tasks (with time, money, etc) at a functional level with modified independence over three sessions    Time  8    Period  Weeks   or 17 visits, for all LTGs   Status  On-going      SLP LONG TERM GOAL #2   Title  pt will tell SLP how her cognitive linguistic deficits could hinder her quality of work    Time  8    Period  Weeks    Status  On-going      SLP LONG TERM GOAL #3   Title  pt will correctly access/use her calendar/appointments and/or medication administration over three sessions    Time  8    Period  Weeks    Status  On-going      SLP LONG TERM GOAL #4   Title  pt will double check all therapy tasks completed, for accuracy, in order to achieve 100% accuracy x 5 sessions    Time  8    Period  Weeks    Status  On-going       Plan - 04/14/18 1345    Clinical Impression Statement  Pt presents with overall mod cognitive communication deficits including awareness, executive function (organization, planning, insight), memory, and attention to detail. Pt expresses interest in returning to work but SLP believes awareness, executive functioning, and memory at current levels would be setting pt up for failure at this time. Pt would benefit from skilled ST to target aforementioned cognitive communication skills to return pt to PLOF    Speech Therapy Frequency  2x / week    Duration  --   8 weeks or 17 visits   Treatment/Interventions  Internal/external aids;Patient/family education;Compensatory strategies;Functional tasks;Cognitive reorganization;SLP instruction and feedback;Cueing hierarchy;Environmental controls    Potential to Achieve Goals  Good    Potential Considerations  Cooperation/participation level   reduced awareness of deficits and errors      Patient will benefit from skilled therapeutic intervention in order to improve the following deficits  and impairments:   Cognitive communication deficit    Problem List Patient Active Problem List   Diagnosis Date Noted  . Transaminitis   . TBI (traumatic brain injury) (H[facility]) 03/05/2018  . ETOH abuse   . Tachypnea   . Traumatic brain injury with loss of consciousness (HCC)   .  Sinus tachycardia   . Seizures (HCC)   . Acute blood loss anemia   . Malnutrition of moderate degree 02/26/2018  . SAH (subarachnoid hemorrhage) (HCC) 02/24/2018   Rondel Baton, MS, CCC-SLP Speech-Language Pathologist  Arlana Lindau 04/14/2018, 1:48 PM  [facility] [address] [place], [place], [address] Phone: [phone]   Fax:  [phone]   Name: Lyriq Finerty MRN: [id number] Date of Birth: 1981/04/04

## 2018-04-14 NOTE — Patient Instructions (Signed)
Take your "rough drafts" of work responsibilities. Make a "final draft" listing your roles/responsibilities for each category (you can type it if you prefer). Try to prioritize your management duties and specify the tasks that you can/should delegate to others. Bring with you when you come back to therapy.  Consider getting a daily planner to record your activities and appointments. See tips below for how to use it.   Using a Calendar System to Help Your Memory  . Carry your calendar with you at all times . Decide on a place for your calendar o Choose a place to keep your calendar at home o Put it back in the same place when you finish recording information or when you return home . Review your calendar with someone. o It may be helpful for someone to give you advice about information to record.  . Record information at the time it occurs.  o Don't wait until later to write down information o Ask others for a moment to record in your calendar . Review your calendar at least twice a day. o Morning: check plans, write "To Do" list o Evening: review day, make notes, plan for next day, transfer incomplete tasks to another day . Write in all appointments. o Eg. doctor, dentist, school, hairdresser . Write in weekly scheduled events. o Record things you do each week such as: your therapy schedule, exercise class, garbage day, Sunday school o Add scheduled events for things you have trouble remembering, such as: completing exercises, making phone calls, reviewing your budget . Write in upcoming events. o Birthdays, holidays, family activities, social events, bills due . Write a "To Do" list every day. o Include items you need to do and items you are asked to do o Check items off ONLY after you do them o Record things you can achieve on that day. Look to another day if you are too busy. . Record details from your day. o Write down information from conversations so you can follow up  later. o Record information from appointments so you can remember what was said. Marland Kitchen. Reference your calendar throughout the day. o Check off items on your "To Do" list o Check your schedule . Plan out larger tasks. o Break down into steps and write in your calendar to make the task more manageable . Review your calendar every few days. o Make sure you have transferred "To-Do" items have not completed to another day o Review the events you've recording and try to remember some details . Review your calendar at the end of the month. o Copy events to the next month.
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
## 2018-04-17 ENCOUNTER — Ambulatory Visit: Payer: Managed Care, Other (non HMO) | Attending: Physical Medicine & Rehabilitation

## 2018-04-17 ENCOUNTER — Telehealth: Payer: Self-pay

## 2018-04-17 DIAGNOSIS — R41841 Cognitive communication deficit: Secondary | ICD-10-CM | POA: Insufficient documentation

## 2018-04-17 DIAGNOSIS — R208 Other disturbances of skin sensation: Secondary | ICD-10-CM | POA: Insufficient documentation

## 2018-04-17 DIAGNOSIS — M6281 Muscle weakness (generalized): Secondary | ICD-10-CM | POA: Insufficient documentation

## 2018-04-17 DIAGNOSIS — R41840 Attention and concentration deficit: Secondary | ICD-10-CM | POA: Insufficient documentation

## 2018-04-17 DIAGNOSIS — R42 Dizziness and giddiness: Secondary | ICD-10-CM | POA: Insufficient documentation

## 2018-04-17 DIAGNOSIS — R2689 Other abnormalities of gait and mobility: Secondary | ICD-10-CM | POA: Insufficient documentation

## 2018-04-17 DIAGNOSIS — R41844 Frontal lobe and executive function deficit: Secondary | ICD-10-CM | POA: Insufficient documentation

## 2018-04-17 NOTE — Telephone Encounter (Signed)
Attempted to call pt re: no-show to speech tx 04-17-18. No answer, and could not leave message.

## 2018-04-18 ENCOUNTER — Encounter

## 2018-04-22 ENCOUNTER — Encounter: Payer: Self-pay | Admitting: Physical Medicine & Rehabilitation

## 2018-04-22 ENCOUNTER — Encounter
Payer: Managed Care, Other (non HMO) | Attending: Physical Medicine & Rehabilitation | Admitting: Physical Medicine & Rehabilitation

## 2018-04-22 DIAGNOSIS — S0100XA Unspecified open wound of scalp, initial encounter: Secondary | ICD-10-CM | POA: Insufficient documentation

## 2018-04-22 DIAGNOSIS — S0102XD Laceration with foreign body of scalp, subsequent encounter: Secondary | ICD-10-CM

## 2018-04-22 NOTE — Patient Instructions (Addendum)
  REALLY WORK ON REGAINING NORMAL SLEEP HABITS.   I CAN GIVE YOU SOMETHING FOR YOUR MEMORY IF YOU WOULD LIKE. USE AIDS AND ROUTINE TO HELP AUGMENT YOUR MEMORY IN THE MEANTIME. CONTINUE WITH THERAPIES AS YOU ARE   FOLLOW UP WITH HR REGARDING RETURN----PUSH THAT BACK AS LONG AS YOU CAN.    WOUND: USE WATER AND SOME GAUZE AND TRY TO SCRUB AND CLEAN OFF THAT YELLOW TISSUE ON THE WOUND. TRY TO LOOSEN IT UP.   DAILY: USE SOME SLIGHTLY MOISTENED GAUZE OVER THE AREA----LET IT DRY.  WHEN YOU PULL OFF THE GAUZE IT STICKS TO THE DEAD TISSUE AND DEBRIS AND PULLS IT OFF. OUR GOAL IS A PINK WOUND BED. IF THE PINK TISSUE IS STILL STAYING MOIST THEN WE MAY NEED TO APPLY A TREATMENT.

## 2018-04-22 NOTE — Progress Notes (Signed)
Subjective:    Patient ID: Alyssa Little, female    DOB: 12/17/1980, 38 y.o.   MRN: [id number]  HPI   Alyssa Little is here in follow up of her TBI. She is moving well and walking without a device. She is still struggling with short term memory and word finding.    She is sleeping most of the time. The last three days she has struggled with "stress" related to her job and this has impacted her sleep. (up to 05:30) as well as some other psychosocial issues.  Prior to the last few days she has felt pent up at home and "restricted". She is unable to use the stove due to fears of burning something. She waits until  Someone is home with her to pursue more advanced tasks.   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      Pain Inventory Average Pain 2 Pain Right Now 1 My pain is intermittent and dull  In the last 24 hours, has pain interfered with the following? General activity 0 Relation with others 0 Enjoyment of life 0 What TIME of day is your pain at its worst? evening Sleep (in general) Fair  Pain is worse with: unsure Pain improves with: ? Relief from Meds: 1  Mobility walk without assistance how many minutes can you walk? 10 ability to climb steps?  yes do you drive?  no Do you have any goals in this area?  no  Function employed # of hrs/week 40  Neuro/Psych No problems in this area  Prior Studies Any changes since last visit?  no  Physicians involved in your care Any changes since last visit?  no   History reviewed. No pertinent family history. Social History   Socioeconomic History  . Marital status: Single    Spouse name: Not on file  . Number of children: Not on file  . Years of education: Not on file  . Highest education level: Not on file  Occupational History  . Not on file  Social Needs  . Financial resource strain: Not on  file  . Food insecurity:    Worry: Not on file    Inability: Not on file  . Transportation needs:    Medical: Not on file    Non-medical: Not on file  Tobacco Use  . Smoking status: Current Every Day Smoker    Packs/day: 0.25    Types: Cigarettes  . Smokeless tobacco: Never Used  Substance and Sexual Activity  . Alcohol use: Yes    Alcohol/week: 1.0 standard drinks    Types: 1 Glasses of wine per week    Comment: 1 drink per day  . Drug use: Never  . Sexual activity: Not on file  Lifestyle  . Physical activity:    Days per week: Not on file    Minutes per session: Not on file  . Stress: Not on file  Relationships  . Social connections:    Talks on phone: Not on file    Gets together: Not on file    Attends religious service: Not on file    Active member of club or organization: Not on file    Attends meetings of clubs or organizations: Not on file    Relationship status: Not on file  Other Topics Concern  . Not on file  Social History Narrative  . Not on file   History reviewed. No pertinent surgical history. History reviewed. No pertinent past medical history. BP 123/88   Pulse 94   Resp 14   Ht 5\' 2"  (1.575 m)   Wt 99 lb (44.9 kg)   SpO2 98%   BMI 18.11 kg/m   Opioid Risk Score:   Fall Risk Score:  `1  Depression screen PHQ 2/9  Depression screen PHQ 2/9 04/22/2018  Decreased Interest 1  Down, Depressed, Hopeless 1  PHQ - 2 Score 2  Altered sleeping 2  Tired, decreased energy 1  Change in appetite 1  Feeling bad or failure about yourself  1  Trouble concentrating 0  Moving slowly or fidgety/restless 0  Suicidal thoughts 0  PHQ-9 Score 7  Difficult doing work/chores Somewhat difficult    Review of Systems  Constitutional: Negative.   HENT: Negative.   Eyes: Negative.   Respiratory: Negative.   Cardiovascular: Negative.   Gastrointestinal: Negative.   Endocrine: Negative.   Genitourinary: Negative.   Musculoskeletal: Negative.   Skin:  Negative.   Allergic/Immunologic: Negative.   Neurological: Negative.   Hematological: Negative.   Psychiatric/Behavioral: Negative.   All other systems reviewed and are negative.      Objective:   Physical Exam   General: No acute distress HEENT: EOMI, oral membranes moist Cards: reg rate  Chest: normal effort Abdomen: Soft, NT, ND Skin: dry, intact Extremities: no edema   Skin: right temple still with what appears to be granulation covered with dried fibrinous debris. Hair is matted into the area which is about 2" x 2" Neuro:  . Cranial nerves 2-12 are intact. Serial  7's intact. Able to spell "world" forwards and backwards. Abstract thinking was appropriate. Remembered 3 words after 5 minutes without hesitation. Sequenced numbers without problems.   No problems with concentration or word finding.  Sensory exam is normal. Reflexes are 2+ in all 4's. Fine motor coordination is intact. No tremors. Motor function is grossly 5/5.  Musculoskeletal: Full ROM, No pain with AROM or PROM in the neck, trunk, or extremities. Posture appropriate Psych: a Little flat.          Assessment & Plan:  1. Decreased functional mobility and mental status secondary to seizure and traumatic SAH/TBI              -making nice gains from a physical and cognitive standpoint  -has ongoing STM deficits but tested fairly well in the office today  -discussed medication (aricept) for memory  -continue with outpt SLP to augment memory and to work on compensatory strategies. We discussed some compensatory strategies today in the office  -optimize sleep and address psychosocial stressors  -pt states that as far as she knows, she is supposed to return to work on 05/19/18. I think this will be difficult. Asked her to follow up with HR to see if her sick time can be extended.  2. Skin/Wound Care: --scrub right temple wound with gauze/H20. Apply slightly moist dressing to area to act as wet to dry. Continue until  fibronecrotic debris is removed. If she has active granulation tissue which is weeping, she can come to the office for silver nitrate  3. Seizure disorder. Dilantin 150 mg 3 times a day, Keppra 1500 mg twice a day  -off vimpat   Thirty minutes of face to face patient care time were spent during this visit. All questions were encouraged and answered. Follow up in 2 months.

## 2018-04-23 ENCOUNTER — Ambulatory Visit: Payer: Managed Care, Other (non HMO) | Admitting: Physical Therapy

## 2018-04-23 ENCOUNTER — Ambulatory Visit: Payer: Managed Care, Other (non HMO) | Admitting: Occupational Therapy

## 2018-04-23 ENCOUNTER — Ambulatory Visit: Payer: Managed Care, Other (non HMO)

## 2018-04-25 ENCOUNTER — Ambulatory Visit: Payer: Managed Care, Other (non HMO) | Admitting: Occupational Therapy

## 2018-04-25 ENCOUNTER — Ambulatory Visit: Payer: Managed Care, Other (non HMO) | Admitting: Physical Therapy

## 2018-04-25 ENCOUNTER — Ambulatory Visit: Payer: Managed Care, Other (non HMO)

## 2018-04-25 DIAGNOSIS — R208 Other disturbances of skin sensation: Secondary | ICD-10-CM

## 2018-04-25 DIAGNOSIS — M6281 Muscle weakness (generalized): Secondary | ICD-10-CM

## 2018-04-25 DIAGNOSIS — R41841 Cognitive communication deficit: Secondary | ICD-10-CM

## 2018-04-25 DIAGNOSIS — R2689 Other abnormalities of gait and mobility: Secondary | ICD-10-CM

## 2018-04-25 DIAGNOSIS — R41844 Frontal lobe and executive function deficit: Secondary | ICD-10-CM | POA: Diagnosis present

## 2018-04-25 DIAGNOSIS — R42 Dizziness and giddiness: Secondary | ICD-10-CM

## 2018-04-25 DIAGNOSIS — R41840 Attention and concentration deficit: Secondary | ICD-10-CM | POA: Diagnosis present

## 2018-04-25 NOTE — Therapy (Addendum)
[facility] [address] [place], [place], [address] Phone: [phone]   Fax:  [phone]  Physical Therapy Treatment  Patient Details  Name: Alyssa Little MRN: [id number] Date of Birth: Sep 03, 1980 Referring Provider (PT): Dr. Naaman Plummer   Encounter Date: 04/25/2018  PT End of Session - 04/25/18 1102    Visit Number  3    Number of Visits  9    Date for PT Re-Evaluation  04/27/18    Authorization Type  [employer]    PT Start Time  1020    PT Stop Time  1050    PT Time Calculation (min)  30 min    Equipment Utilized During Treatment  --   min guard to S prn   Activity Tolerance  Patient tolerated treatment well    Behavior During Therapy  [facility] for tasks assessed/performed       No past medical history on file.  No past surgical history on file.  There were no vitals filed for this visit.  Subjective Assessment - 04/25/18 1023    Subjective  Pt reports haivng a rough week.  Her neighbor overdosed on Sunday and she did not find out until Wednesday when the neighbors family member was cleaning out the apartment.  Reports feeling like her mobility and balance are back to baseline.  States that she has always been clumsy.    Patient is accompained by:  --   boyfriend: [name]   Pertinent History  ETOH abuse per chart    Patient Stated Goals  I want to get back to "as normal as I can".     Currently in Pain?  No/denies    Pain Onset  1 to 4 weeks ago         [facility] PT Assessment - 04/25/18 0001      Functional Gait  Assessment   Gait assessed   Yes    Gait Level Surface  Walks 20 ft in less than 5.5 sec, no assistive devices, good speed, no evidence for imbalance, normal gait pattern, deviates no more than 6 in outside of the 12 in walkway width.   5.3 sec   Change in Gait Speed  Able to smoothly change walking speed without loss of balance or gait deviation. Deviate no more than 6 in outside of the 12 in  walkway width.    Gait with Horizontal Head Turns  Performs head turns smoothly with no change in gait. Deviates no more than 6 in outside 12 in walkway width    Gait with Vertical Head Turns  Performs head turns with no change in gait. Deviates no more than 6 in outside 12 in walkway width.    Gait and Pivot Turn  Pivot turns safely within 3 sec and stops quickly with no loss of balance.    Step Over Obstacle  Is able to step over 2 stacked shoe boxes taped together (9 in total height) without changing gait speed. No evidence of imbalance.    Gait with Narrow Base of Support  Is able to ambulate for 10 steps heel to toe with no staggering.    Gait with Eyes Closed  Walks 20 ft, no assistive devices, good speed, no evidence of imbalance, normal gait pattern, deviates no more than 6 in outside 12 in walkway width. Ambulates 20 ft in less than 7 sec.    Ambulating Backwards  Walks 20 ft, no assistive devices, good speed, no evidence for imbalance, normal  gait    Steps  Alternating feet, no rail.    Total Score  30    FGA comment:  met goal of 30/30         PT Education - 04/25/18 1059    Education Details  Goals met and discharge from PT;continuing with HEP as written previous session    Person(s) Educated  Patient    Methods  Explanation;Demonstration;Verbal cues    Comprehension  Verbalized understanding       PT Short Term Goals - 03/28/18 1252      PT SHORT TERM GOAL #1   Title  same as LTGs        PT Long Term Goals - 04/25/18 1101      PT LONG TERM GOAL #1   Title  Pt will be IND in HEP to improve strength, balance, and dizziness. TARGET DATE FOR ALL LTGS: 04/25/18    Status  Achieved      PT LONG TERM GOAL #2   Title  Pt will improve FGA score to 30/30 to decr. falls risk.     Status  Achieved      PT LONG TERM GOAL #3   Title  Perform gait over uneven terrain and write goal as indicated.     Baseline  Pt independent with gait on uneven terrain on 2nd visit    Status   Achieved      PT LONG TERM GOAL #4   Title  Pt will report return to yoga to improve QOL and maintain gains made in PT.     Status  Achieved      PT LONG TERM GOAL #5   Title  Monitor dizziness and write goals as indicated.     Baseline  Denies dizziness    Status  Achieved            Plan - 04/25/18 1102    Clinical Impression Statement  Pt has met all LTG's.  Pt feels like she is back to baseline and does not feel like she needs further PT.  Discharge from PT by primary PT, Geoffry Paradise.    Clinical Impairments Affecting Rehab Potential  see above    PT Next Visit Plan  Discharge from PT    PT Kerby and Agree with Plan of Care  Patient       Patient will benefit from skilled therapeutic intervention in order to improve the following deficits and impairments:  (PT will not formally address pain but will monitor closely. )  Visit Diagnosis: Other abnormalities of gait and mobility  Muscle weakness (generalized)  Dizziness and giddiness  Other disturbances of skin sensation     Problem List Patient Active Problem List   Diagnosis Date Noted  . Open scalp wound 04/22/2018  . Transaminitis   . TBI (traumatic brain injury) ([place]) 03/05/2018  . ETOH abuse   . Tachypnea   . Traumatic brain injury with loss of consciousness ([place])   . Sinus tachycardia   . Seizures ([place])   . Acute blood loss anemia   . Malnutrition of moderate degree 02/26/2018  . SAH (subarachnoid hemorrhage) ([place]) 02/24/2018   Narda Bonds, PTA [facility] 04/25/18 12:43 PM Phone: [phone] Fax: [facility] [address] [place], [place], [address] Phone: [phone]   Fax:  [phone]  Name: Alyssa Little MRN: [id number] Date of Birth: 01/28/1981  PHYSICAL THERAPY DISCHARGE SUMMARY  Visits from Start of Care:  3  Current functional level related to goals / functional outcomes: PT Long Term Goals - 04/25/18 1101      PT LONG TERM GOAL #1   Title  Pt will be IND in HEP to improve strength, balance, and dizziness. TARGET DATE FOR ALL LTGS: 04/25/18    Status  Achieved      PT LONG TERM GOAL #2   Title  Pt will improve FGA score to 30/30 to decr. falls risk.     Status  Achieved      PT LONG TERM GOAL #3   Title  Perform gait over uneven terrain and write goal as indicated.     Baseline  Pt independent with gait on uneven terrain on 2nd visit    Status  Achieved      PT LONG TERM GOAL #4   Title  Pt will report return to yoga to improve QOL and maintain gains made in PT.     Status  Achieved      PT LONG TERM GOAL #5   Title  Monitor dizziness and write goals as indicated.     Baseline  Denies dizziness    Status  Achieved         Remaining deficits: none   Education / Equipment: HEP  Plan: Patient agrees to discharge.  Patient goals were met. Patient is being discharged due to meeting the stated rehab goals.  ?????     D/c completed by PT.    Geoffry Paradise, PT,DPT 04/25/18 12:55 PM Phone: [phone] Fax: [phone]

## 2018-04-25 NOTE — Patient Instructions (Signed)

## 2018-04-25 NOTE — Therapy (Signed)
[facility] [address] [place], [place], [address] Phone: [phone]   Fax:  [phone]  Occupational Therapy Treatment  Patient Details  Name: Alyssa Little MRN: [id number] Date of Birth: 1980-05-09 Referring Provider (OT): Dr. Alger Simons   Encounter Date: 04/25/2018  OT End of Session - 04/25/18 1105    Visit Number  4    Number of Visits  17    Date for OT Re-Evaluation  05/24/18    Authorization Type  [employer] Managed--deductible and oop have been met, visit limit:  medical necessity    OT Start Time  1105    OT Stop Time  1145    OT Time Calculation (min)  40 min       No past medical history on file.  No past surgical history on file.  There were no vitals filed for this visit.             Treatment: Typing test 29 wpm, 88% accuracy, followed by typing actitity(clouds) for increased speed and accuracy with typing. Reviewed theraband HEP and upgraded to red, min v.c Activities on constant therapy for alternating atttention, alphabetizing upper and lower case words, pt demonstrates good accuracy and performance, with 100% accuaracy. Visual memory task, mod difficulty with only 56 % accuracy. Organizing a breakfast for fourteen people, within a budget,  pt started task but did not finish due to time. Therapist issued as homework.              OT Short Term Goals - 04/25/18 1132      OT SHORT TERM GOAL #1   Title  Pt will be independent with HEP for strength/conditioning.--check STGs 04/16/18    Time  4    Period  Weeks    Status  Achieved      OT SHORT TERM GOAL #2   Title  Pt will verbalize understandng of memory/cognitive compensation strategies for ADLs/IADLs.     Time  4    Period  Weeks    Status  Achieved      OT SHORT TERM GOAL #3   Title  Pt will complete functional organizational task in prep for work tasks with at least 95% accuracy.    Period  Weeks    Status  On-going      OT SHORT TERM GOAL #4   Title  Pt will perform simple-mod complex cooking task mod I.    Time  4    Period  Weeks    Status  On-going        OT Long Term Goals - 03/27/18 0912      OT LONG TERM GOAL #1   Title  Pt will utilize at least 3 memory/cognitive compensation strategies for ADLs/IADLs.    Time  8    Period  Weeks    Status  New      OT LONG TERM GOAL #2   Title  Pt will perform simulated work tasks with good accuracy and without rest for at least 80mn.    Time  8    Period  Weeks    Status  New      OT LONG TERM GOAL #3   Title  Pt will be able to divide attention between at least 1 physical and 1 cognitive task with at least 95% accuracy in prep for driving and work tasks.    Time  8    Period  Weeks    Status  New            Plan - 04/25/18 1141    Clinical Impression Statement  Pt is progressing towards goals. Pt demonstrates improving alteranating attention however she doid not perfrom as well with visual memory task.    Occupational Profile and client history currently impacting functional performance  Pt was living alone and working full time at [employer] prior to hospitalization.  Pt is currently unable to drive, work, and needs supervision.      Occupational performance deficits (Please refer to evaluation for details):  ADL's;IADL's;Work;Leisure;Social Participation    Rehab Potential  Good    OT Frequency  2x / week    OT Duration  8 weeks    OT Treatment/Interventions  Self-care/ADL training;Therapeutic exercise;Visual/perceptual remediation/compensation;Moist Heat;Neuromuscular education;Patient/family education;Splinting;Therapeutic activities;Functional Mobility Training;Energy conservation;Cryotherapy;DME and/or AE instruction;Manual Therapy;Passive range of motion;Cognitive remediation/compensation    Plan  mod complex cooking, continue organization, simulaterd work activities.    Consulted and Agree with Plan of Care   Patient;Family member/caregiver       Patient will benefit from skilled therapeutic intervention in order to improve the following deficits and impairments:  Decreased cognition, Decreased knowledge of use of DME, Decreased strength, Decreased endurance, Decreased activity tolerance, Decreased knowledge of precautions, Decreased safety awareness, Impaired perceived functional ability, Impaired vision/preception  Visit Diagnosis: Muscle weakness (generalized)  Other disturbances of skin sensation  Frontal lobe and executive function deficit  Attention and concentration deficit    Problem List Patient Active Problem List   Diagnosis Date Noted  . Open scalp wound 04/22/2018  . Transaminitis   . TBI (traumatic brain injury) ([facility]) 03/05/2018  . ETOH abuse   . Tachypnea   . Traumatic brain injury with loss of consciousness ([facility])   . Sinus tachycardia   . Seizures ([facility])   . Acute blood loss anemia   . Malnutrition of moderate degree 02/26/2018  . SAH (subarachnoid hemorrhage) ([facility]) 02/24/2018    RINE,KATHRYN 04/25/2018, 1:36 PM  [facility] [address] [place], [place], [address] Phone: [phone]   Fax:  [phone]  Name: Alyssa Little MRN: [id number] Date of Birth: 04-Jan-1981

## 2018-04-25 NOTE — Patient Instructions (Signed)
  Please complete the assigned speech therapy homework prior to your next session and return it to the speech therapist at your next visit.  

## 2018-04-25 NOTE — Therapy (Signed)
[facility] [address] [place], [place], [address] Phone: [phone]   Fax:  [phone]  Speech Language Pathology Treatment  Patient Details  Name: Alyssa LacyChristina Danielle Little MRN: [id number] Date of Birth: 05/26/1980 Referring Provider (SLP): Faith RogueSwartz, Zachary, MD   Encounter Date: 04/25/2018  End of Session - 04/25/18 1300    Visit Number  3    Number of Visits  17    Date for SLP Re-Evaluation  06/25/18    Authorization Type  16 visits approved 03/27/18-09/26/18    Authorization - Visit Number  2    Authorization - Number of Visits  16    SLP Start Time  1149    SLP Stop Time   1230    SLP Time Calculation (min)  41 min    Activity Tolerance  Patient tolerated treatment well       History reviewed. No pertinent past medical history.  History reviewed. No pertinent surgical history.  There were no vitals filed for this visit.  Subjective Assessment - 04/25/18 1205    Subjective  Pt's friend overdosed and the sister found him Wednesday - reason why pt no-showed Wednesday.            ADULT SLP TREATMENT - 04/25/18 1204      General Information   Behavior/Cognition  Alert;Cooperative;Pleasant mood      Treatment Provided   Treatment provided  Cognitive-Linquistic      Cognitive-Linquistic Treatment   Treatment focused on  Cognition    Skilled Treatment  SLP worked with pt today on attention (alternating and sustained/selective), attention to detail, and awareness of errors. Pt added figures (work hours) 4/5 correct but needed extra time and mod cues to assist with the one incorrect response. In a highly detailed task pt req'd  extra time for processing and cues to follow details. She req'd assistance with the computation of groceries.      Assessment / Recommendations / Plan   Plan  Continue with current plan of care      Progression Toward Goals   Progression toward goals  Progressing toward goals          SLP Short Term Goals - 04/25/18 1301      SLP SHORT TERM GOAL #1   Title  pt will demo the use of a memory compensation that is functional for her between 3 sessions    Time  3    Period  Weeks    Status  On-going      SLP SHORT TERM GOAL #2   Title  pt will tell SLP 3 of her cognitive communicative deficits    Time  3    Period  Weeks    Status  On-going      SLP SHORT TERM GOAL #3   Title  pt will complete functional executive function tasks with no more than min A occasionally over three sessions    Time  3    Period  Weeks    Status  On-going      SLP SHORT TERM GOAL #4   Title  pt will demo organization and planning adequate for simple to mod complex tasks in therapy, 90% accuracy after self-correction/double-checking    Time  3    Period  Weeks    Status  On-going       SLP Long Term Goals - 04/25/18 1301      SLP LONG TERM GOAL #1   Title  pt will demo executive function in mod complex tasks (with time, money, etc) at a functional level with modified independence over three sessions    Time  7    Period  Weeks   or 17 visits, for all LTGs   Status  On-going      SLP LONG TERM GOAL #2   Title  pt will tell SLP how her cognitive linguistic deficits could hinder her quality of work    Time  7    Period  Weeks    Status  On-going      SLP LONG TERM GOAL #3   Title  pt will correctly access/use her calendar/appointments and/or medication administration over three sessions    Time  8    Period  Weeks    Status  On-going      SLP LONG TERM GOAL #4   Title  pt will double check all therapy tasks completed, for accuracy, in order to achieve 100% accuracy x 5 sessions    Time  7    Period  Weeks    Status  On-going       Plan - 04/25/18 1300    Clinical Impression Statement  Pt presents with overall mod cognitive communication deficits including awareness, executive function (organization, planning, insight), memory, and attention to detail. Pt  expresses interest in returning to work but SLP believes awareness, executive functioning, and memory at current levels would be setting pt up for failure at this time. Pt would benefit from skilled ST to target aforementioned cognitive communication skills to return pt to PLOF    Speech Therapy Frequency  2x / week    Duration  --   8 weeks or 17 sessions   Treatment/Interventions  Internal/external aids;Patient/family education;Compensatory strategies;Functional tasks;Cognitive reorganization;SLP instruction and feedback;Cueing hierarchy;Environmental controls    Potential to Achieve Goals  Good    Potential Considerations  Cooperation/participation level;Severity of impairments       Patient will benefit from skilled therapeutic intervention in order to improve the following deficits and impairments:   Cognitive communication deficit    Problem List Patient Active Problem List   Diagnosis Date Noted  . Open scalp wound 04/22/2018  . Transaminitis   . TBI (traumatic brain injury) ([facility]) 03/05/2018  . ETOH abuse   . Tachypnea   . Traumatic brain injury with loss of consciousness ([facility])   . Sinus tachycardia   . Seizures ([facility])   . Acute blood loss anemia   . Malnutrition of moderate degree 02/26/2018  . SAH (subarachnoid hemorrhage) ([facility]) 02/24/2018    [facility] ,MS, CCC-SLP  04/25/2018, 1:02 PM  [facility] [address] [place], [place], [address] Phone: [phone]   Fax:  [phone]   Name: Alyssa Little MRN: [id number] Date of Birth: 12-06-1980

## 2018-04-28 ENCOUNTER — Ambulatory Visit: Payer: Managed Care, Other (non HMO) | Admitting: Occupational Therapy

## 2018-04-28 ENCOUNTER — Ambulatory Visit: Payer: Managed Care, Other (non HMO) | Admitting: Speech Pathology

## 2018-04-30 ENCOUNTER — Ambulatory Visit: Payer: Managed Care, Other (non HMO) | Admitting: Occupational Therapy

## 2018-04-30 ENCOUNTER — Ambulatory Visit: Payer: Managed Care, Other (non HMO)

## 2018-05-06 ENCOUNTER — Ambulatory Visit: Payer: Managed Care, Other (non HMO)

## 2018-05-06 ENCOUNTER — Ambulatory Visit: Payer: Managed Care, Other (non HMO) | Admitting: Occupational Therapy

## 2018-05-08 ENCOUNTER — Ambulatory Visit: Payer: Managed Care, Other (non HMO) | Admitting: Occupational Therapy

## 2018-05-08 ENCOUNTER — Ambulatory Visit: Payer: Managed Care, Other (non HMO) | Admitting: Speech Pathology

## 2018-05-12 ENCOUNTER — Encounter: Payer: Managed Care, Other (non HMO) | Admitting: Occupational Therapy

## 2018-05-14 ENCOUNTER — Encounter: Payer: Managed Care, Other (non HMO) | Admitting: Occupational Therapy

## 2018-05-14 ENCOUNTER — Telehealth: Payer: Self-pay | Admitting: *Deleted

## 2018-05-14 NOTE — Telephone Encounter (Signed)
Arcilia called and needs clarification if she is cleared for work or not.

## 2018-05-15 NOTE — Telephone Encounter (Signed)
This is my last office not  1. Decreased functional mobility and mental status secondary to seizure and traumatic SAH/TBI  -making nice gains from a physical and cognitive standpoint             -has ongoing STM deficits but tested fairly well in the office today             -discussed medication (aricept) for memory             -continue with outpt SLP to augment memory and to work on compensatory strategies. We discussed some compensatory strategies today in the office             -optimize sleep and address psychosocial stressors             -pt states that as far as she knows, she is supposed to return to work on 05/19/18. I think this will be difficult. Asked her to follow up with HR to see if her sick time can be extended.      I completed paperwork for [employer] to HELP her and tried to give her more time.    She can go back to work at her own risk. I'm not going to prevent her from working. There is no harm that can come to her other than struggling to perform in her job.

## 2018-05-16 NOTE — Telephone Encounter (Signed)
[facility] called about some paperwork Riley Kill was supposed to fax to her work yesterday.  Do you know anything about this?

## 2018-06-18 ENCOUNTER — Encounter
Payer: Managed Care, Other (non HMO) | Attending: Physical Medicine & Rehabilitation | Admitting: Physical Medicine & Rehabilitation

## 2018-06-18 DIAGNOSIS — S0102XD Laceration with foreign body of scalp, subsequent encounter: Secondary | ICD-10-CM | POA: Insufficient documentation

## 2018-06-23 ENCOUNTER — Encounter (HOSPITAL_COMMUNITY): Payer: Self-pay

## 2018-06-23 ENCOUNTER — Inpatient Hospital Stay (HOSPITAL_COMMUNITY)
Admission: EM | Admit: 2018-06-23 | Discharge: 2018-06-24 | DRG: 101 | Discharge status: Against Medical Advice | Payer: Managed Care, Other (non HMO) | Attending: Internal Medicine | Admitting: Internal Medicine

## 2018-06-23 DIAGNOSIS — R740 Nonspecific elevation of levels of transaminase and lactic acid dehydrogenase [LDH]: Secondary | ICD-10-CM | POA: Diagnosis present

## 2018-06-23 DIAGNOSIS — E872 Acidosis, unspecified: Secondary | ICD-10-CM | POA: Diagnosis present

## 2018-06-23 DIAGNOSIS — E440 Moderate protein-calorie malnutrition: Secondary | ICD-10-CM | POA: Diagnosis present

## 2018-06-23 DIAGNOSIS — R569 Unspecified convulsions: Secondary | ICD-10-CM | POA: Diagnosis not present

## 2018-06-23 DIAGNOSIS — Z91013 Allergy to seafood: Secondary | ICD-10-CM

## 2018-06-23 DIAGNOSIS — G40909 Epilepsy, unspecified, not intractable, without status epilepticus: Principal | ICD-10-CM

## 2018-06-23 DIAGNOSIS — R509 Fever, unspecified: Secondary | ICD-10-CM | POA: Diagnosis present

## 2018-06-23 DIAGNOSIS — R410 Disorientation, unspecified: Secondary | ICD-10-CM

## 2018-06-23 DIAGNOSIS — Z681 Body mass index (BMI) 19 or less, adult: Secondary | ICD-10-CM

## 2018-06-23 DIAGNOSIS — Z87820 Personal history of traumatic brain injury: Secondary | ICD-10-CM

## 2018-06-23 DIAGNOSIS — G9340 Encephalopathy, unspecified: Secondary | ICD-10-CM | POA: Diagnosis present

## 2018-06-23 DIAGNOSIS — Z5329 Procedure and treatment not carried out because of patient's decision for other reasons: Secondary | ICD-10-CM | POA: Diagnosis not present

## 2018-06-23 DIAGNOSIS — F17210 Nicotine dependence, cigarettes, uncomplicated: Secondary | ICD-10-CM | POA: Diagnosis present

## 2018-06-23 DIAGNOSIS — F1010 Alcohol abuse, uncomplicated: Secondary | ICD-10-CM | POA: Diagnosis present

## 2018-06-23 DIAGNOSIS — Z9119 Patient's noncompliance with other medical treatment and regimen: Secondary | ICD-10-CM

## 2018-06-23 DIAGNOSIS — R7401 Elevation of levels of liver transaminase levels: Secondary | ICD-10-CM | POA: Diagnosis present

## 2018-06-23 DIAGNOSIS — E46 Unspecified protein-calorie malnutrition: Secondary | ICD-10-CM | POA: Diagnosis present

## 2018-06-23 DIAGNOSIS — Z79899 Other long term (current) drug therapy: Secondary | ICD-10-CM

## 2018-06-23 HISTORY — DX: Epilepsy, unspecified, not intractable, without status epilepticus: G40.909

## 2018-06-23 HISTORY — DX: Alcohol abuse, uncomplicated: F10.10

## 2018-06-23 HISTORY — DX: Unspecified intracranial injury with loss of consciousness of unspecified duration, initial encounter: S06.9X9A

## 2018-06-23 HISTORY — DX: Nontraumatic subarachnoid hemorrhage, unspecified: I60.9

## 2018-06-23 HISTORY — DX: Unspecified intracranial injury with loss of consciousness status unknown, initial encounter: S06.9XAA

## 2018-06-23 NOTE — ED Notes (Signed)
Bed: WA07 Expected date:  Expected time:  Means of arrival:  Comments: 38 yo Seizures

## 2018-06-23 NOTE — ED Triage Notes (Signed)
Per EMS, Pt is coming from home. Pt had a witnessed seizure, pt had a focal seizure. Htx of major head injury in November, no specific detail from witness at scene. Pt baseline unknown, pt still post ictal, recently d/c kepra. Bruise noted on right shoulder.

## 2018-06-24 ENCOUNTER — Emergency Department (HOSPITAL_COMMUNITY): Payer: Managed Care, Other (non HMO)

## 2018-06-24 ENCOUNTER — Other Ambulatory Visit: Payer: Self-pay

## 2018-06-24 ENCOUNTER — Encounter (HOSPITAL_COMMUNITY): Payer: Self-pay | Admitting: Internal Medicine

## 2018-06-24 DIAGNOSIS — R740 Nonspecific elevation of levels of transaminase and lactic acid dehydrogenase [LDH]: Secondary | ICD-10-CM

## 2018-06-24 DIAGNOSIS — Z5329 Procedure and treatment not carried out because of patient's decision for other reasons: Secondary | ICD-10-CM | POA: Diagnosis not present

## 2018-06-24 DIAGNOSIS — G9340 Encephalopathy, unspecified: Secondary | ICD-10-CM | POA: Diagnosis present

## 2018-06-24 DIAGNOSIS — E872 Acidosis, unspecified: Secondary | ICD-10-CM | POA: Diagnosis present

## 2018-06-24 DIAGNOSIS — E46 Unspecified protein-calorie malnutrition: Secondary | ICD-10-CM | POA: Diagnosis present

## 2018-06-24 DIAGNOSIS — Z87820 Personal history of traumatic brain injury: Secondary | ICD-10-CM | POA: Diagnosis not present

## 2018-06-24 DIAGNOSIS — E440 Moderate protein-calorie malnutrition: Secondary | ICD-10-CM

## 2018-06-24 DIAGNOSIS — Z9119 Patient's noncompliance with other medical treatment and regimen: Secondary | ICD-10-CM | POA: Diagnosis not present

## 2018-06-24 DIAGNOSIS — F17210 Nicotine dependence, cigarettes, uncomplicated: Secondary | ICD-10-CM | POA: Diagnosis present

## 2018-06-24 DIAGNOSIS — F1010 Alcohol abuse, uncomplicated: Secondary | ICD-10-CM | POA: Diagnosis present

## 2018-06-24 DIAGNOSIS — Z79899 Other long term (current) drug therapy: Secondary | ICD-10-CM | POA: Diagnosis not present

## 2018-06-24 DIAGNOSIS — Z91013 Allergy to seafood: Secondary | ICD-10-CM | POA: Diagnosis not present

## 2018-06-24 DIAGNOSIS — R569 Unspecified convulsions: Secondary | ICD-10-CM | POA: Diagnosis present

## 2018-06-24 DIAGNOSIS — Z681 Body mass index (BMI) 19 or less, adult: Secondary | ICD-10-CM | POA: Diagnosis not present

## 2018-06-24 DIAGNOSIS — G40909 Epilepsy, unspecified, not intractable, without status epilepticus: Secondary | ICD-10-CM | POA: Diagnosis present

## 2018-06-24 DIAGNOSIS — R509 Fever, unspecified: Secondary | ICD-10-CM | POA: Diagnosis present

## 2018-06-24 LAB — CSF CELL COUNT WITH DIFFERENTIAL
RBC Count, CSF: 1 /mm3 — ABNORMAL HIGH
RBC Count, CSF: 1 /mm3 — ABNORMAL HIGH
Tube #: 1
Tube #: 4
WBC, CSF: 1 /mm3 (ref 0–5)
WBC, CSF: 1 /mm3 (ref 0–5)

## 2018-06-24 LAB — CBC WITH DIFFERENTIAL/PLATELET
Abs Immature Granulocytes: 0.08 10*3/uL — ABNORMAL HIGH (ref 0.00–0.07)
BASOS PCT: 0 %
Basophils Absolute: 0 10*3/uL (ref 0.0–0.1)
Eosinophils Absolute: 0 10*3/uL (ref 0.0–0.5)
Eosinophils Relative: 0 %
HCT: 36.3 % (ref 36.0–46.0)
Hemoglobin: 11.1 g/dL — ABNORMAL LOW (ref 12.0–15.0)
Immature Granulocytes: 1 %
Lymphocytes Relative: 3 %
Lymphs Abs: 0.4 10*3/uL — ABNORMAL LOW (ref 0.7–4.0)
MCH: 33.6 pg (ref 26.0–34.0)
MCHC: 30.6 g/dL (ref 30.0–36.0)
MCV: 110 fL — AB (ref 80.0–100.0)
MONOS PCT: 4 %
Monocytes Absolute: 0.7 10*3/uL (ref 0.1–1.0)
Neutro Abs: 14.3 10*3/uL — ABNORMAL HIGH (ref 1.7–7.7)
Neutrophils Relative %: 92 %
Platelets: 242 10*3/uL (ref 150–400)
RBC: 3.3 MIL/uL — ABNORMAL LOW (ref 3.87–5.11)
RDW: 14.5 % (ref 11.5–15.5)
WBC: 15.5 10*3/uL — ABNORMAL HIGH (ref 4.0–10.5)
nRBC: 0 % (ref 0.0–0.2)

## 2018-06-24 LAB — LACTIC ACID, PLASMA
Lactic Acid, Venous: 0.8 mmol/L (ref 0.5–1.9)
Lactic Acid, Venous: 1.8 mmol/L (ref 0.5–1.9)

## 2018-06-24 LAB — RAPID URINE DRUG SCREEN, HOSP PERFORMED
Amphetamines: NOT DETECTED
BENZODIAZEPINES: NOT DETECTED
Barbiturates: NOT DETECTED
Cocaine: NOT DETECTED
Opiates: NOT DETECTED
Tetrahydrocannabinol: NOT DETECTED

## 2018-06-24 LAB — URINALYSIS, ROUTINE W REFLEX MICROSCOPIC
Bilirubin Urine: NEGATIVE
GLUCOSE, UA: 50 mg/dL — AB
KETONES UR: 80 mg/dL — AB
Leukocytes,Ua: NEGATIVE
Nitrite: NEGATIVE
Protein, ur: NEGATIVE mg/dL
Specific Gravity, Urine: 1.016 (ref 1.005–1.030)
pH: 5 (ref 5.0–8.0)

## 2018-06-24 LAB — ETHANOL: Alcohol, Ethyl (B): <10 mg/dL (ref <10)

## 2018-06-24 LAB — COMPREHENSIVE METABOLIC PANEL
ALT: 21 U/L (ref 0–44)
AST: 62 U/L — ABNORMAL HIGH (ref 15–41)
Albumin: 3.9 g/dL (ref 3.5–5.0)
Alkaline Phosphatase: 106 U/L (ref 38–126)
Anion gap: 15 (ref 5–15)
BUN: <5 mg/dL — ABNORMAL LOW (ref 6–20)
CO2: 21 mmol/L — ABNORMAL LOW (ref 22–32)
Calcium: 8 mg/dL — ABNORMAL LOW (ref 8.9–10.3)
Chloride: 99 mmol/L (ref 98–111)
Creatinine, Ser: 0.64 mg/dL (ref 0.44–1.00)
GFR calc non Af Amer: >60 mL/min (ref >60)
Glucose, Bld: 134 mg/dL — ABNORMAL HIGH (ref 70–99)
Potassium: 3.2 mmol/L — ABNORMAL LOW (ref 3.5–5.1)
Sodium: 135 mmol/L (ref 135–145)
Total Bilirubin: 1.3 mg/dL — ABNORMAL HIGH (ref 0.3–1.2)
Total Protein: 7 g/dL (ref 6.5–8.1)

## 2018-06-24 LAB — I-STAT BETA HCG BLOOD, ED (MC, WL, AP ONLY): I-stat hCG, quantitative: <5 m[IU]/mL (ref <5)

## 2018-06-24 LAB — INFLUENZA PANEL BY PCR (TYPE A & B)
INFLAPCR: NEGATIVE
Influenza B By PCR: NEGATIVE

## 2018-06-24 LAB — GAMMA GT: GGT: 201 U/L — ABNORMAL HIGH (ref 7–50)

## 2018-06-24 LAB — PROTIME-INR
INR: 1 (ref 0.8–1.2)
Prothrombin Time: 13.2 seconds (ref 11.4–15.2)

## 2018-06-24 LAB — PROTEIN, CSF: Total  Protein, CSF: 41 mg/dL (ref 15–45)

## 2018-06-24 LAB — GLUCOSE, CSF: Glucose, CSF: 92 mg/dL — ABNORMAL HIGH (ref 40–70)

## 2018-06-24 MED ORDER — FOLIC ACID 1 MG PO TABS
1.0000 mg | ORAL_TABLET | Freq: Every day | ORAL | Status: DC
  Administered 2018-06-24: 1 mg via ORAL
  Filled 2018-06-24: qty 1

## 2018-06-24 MED ORDER — PHENYTOIN 125 MG/5ML PO SUSP
150.0000 mg | Freq: Three times a day (TID) | ORAL | Status: DC
  Administered 2018-06-24: 150 mg via ORAL
  Filled 2018-06-24 (×3): qty 8

## 2018-06-24 MED ORDER — LEVETIRACETAM 500 MG PO TABS
1500.0000 mg | ORAL_TABLET | Freq: Two times a day (BID) | ORAL | Status: DC
  Administered 2018-06-24: 1500 mg via ORAL
  Filled 2018-06-24: qty 3

## 2018-06-24 MED ORDER — VANCOMYCIN HCL 10 G IV SOLR
1250.0000 mg | INTRAVENOUS | Status: AC
  Administered 2018-06-24: 1250 mg via INTRAVENOUS
  Filled 2018-06-24: qty 1250

## 2018-06-24 MED ORDER — PHENYTOIN 125 MG/5ML PO SUSP
150.0000 mg | Freq: Once | ORAL | Status: AC
  Administered 2018-06-24: 150 mg via ORAL
  Filled 2018-06-24: qty 8

## 2018-06-24 MED ORDER — SODIUM CHLORIDE 0.9 % IV BOLUS
1000.0000 mL | Freq: Once | INTRAVENOUS | Status: AC
  Administered 2018-06-24: 1000 mL via INTRAVENOUS

## 2018-06-24 MED ORDER — ACETAMINOPHEN 500 MG PO TABS
1000.0000 mg | ORAL_TABLET | Freq: Once | ORAL | Status: AC
  Administered 2018-06-24: 1000 mg via ORAL
  Filled 2018-06-24: qty 2

## 2018-06-24 MED ORDER — SODIUM CHLORIDE 0.9 % IV SOLN
2.0000 g | Freq: Once | INTRAVENOUS | Status: AC
  Administered 2018-06-24: 2 g via INTRAVENOUS
  Filled 2018-06-24: qty 20

## 2018-06-24 MED ORDER — VANCOMYCIN HCL IN DEXTROSE 750-5 MG/150ML-% IV SOLN
750.0000 mg | Freq: Two times a day (BID) | INTRAVENOUS | Status: DC
  Filled 2018-06-24: qty 150

## 2018-06-24 MED ORDER — VITAMIN B-1 100 MG PO TABS
100.0000 mg | ORAL_TABLET | Freq: Every day | ORAL | Status: DC
  Administered 2018-06-24: 100 mg via ORAL
  Filled 2018-06-24: qty 1

## 2018-06-24 MED ORDER — ADULT MULTIVITAMIN W/MINERALS CH
1.0000 | ORAL_TABLET | Freq: Every day | ORAL | Status: DC
  Administered 2018-06-24: 1 via ORAL
  Filled 2018-06-24: qty 1

## 2018-06-24 MED ORDER — LORAZEPAM 2 MG/ML IJ SOLN: 1.0000 mg | Freq: Four times a day (QID) | INTRAMUSCULAR | Status: DC | PRN

## 2018-06-24 MED ORDER — THIAMINE HCL 100 MG/ML IJ SOLN: 100.0000 mg | Freq: Every day | INTRAMUSCULAR | Status: DC

## 2018-06-24 MED ORDER — SODIUM CHLORIDE 0.9 % IV SOLN
INTRAVENOUS | Status: DC
  Administered 2018-06-24: 08:00:00 via INTRAVENOUS

## 2018-06-24 MED ORDER — ONDANSETRON HCL 4 MG/2ML IJ SOLN: 4.0000 mg | Freq: Four times a day (QID) | INTRAMUSCULAR | Status: DC | PRN

## 2018-06-24 MED ORDER — LEVETIRACETAM IN NACL 1500 MG/100ML IV SOLN
1500.0000 mg | Freq: Once | INTRAVENOUS | Status: AC
  Administered 2018-06-24: 1500 mg via INTRAVENOUS
  Filled 2018-06-24: qty 100

## 2018-06-24 MED ORDER — LIDOCAINE-EPINEPHRINE (PF) 2 %-1:200000 IJ SOLN
10.0000 mL | Freq: Once | INTRAMUSCULAR | Status: AC
  Administered 2018-06-24: 10 mL via INTRADERMAL
  Filled 2018-06-24: qty 20

## 2018-06-24 MED ORDER — SODIUM CHLORIDE 0.9% FLUSH
3.0000 mL | Freq: Once | INTRAVENOUS | Status: AC
  Administered 2018-06-24: 3 mL via INTRAVENOUS

## 2018-06-24 MED ORDER — LORAZEPAM 1 MG PO TABS: 1.0000 mg | ORAL_TABLET | Freq: Four times a day (QID) | ORAL | Status: DC | PRN

## 2018-06-24 MED ORDER — SODIUM CHLORIDE 0.9 % IV SOLN
2.0000 g | Freq: Two times a day (BID) | INTRAVENOUS | Status: DC
  Administered 2018-06-24: 2 g via INTRAVENOUS
  Filled 2018-06-24: qty 20
  Filled 2018-06-24: qty 2

## 2018-06-24 MED ORDER — ONDANSETRON HCL 4 MG PO TABS: 4.0000 mg | ORAL_TABLET | Freq: Four times a day (QID) | ORAL | Status: DC | PRN

## 2018-06-24 NOTE — Progress Notes (Signed)
[facility]   Just an FYI for the treating team:  Pt is known to me from inpatient rehab when she was admitted for a TBI. Additionally I have followed her in the office as an outpatient. She was NOT instructed to reduce or stop any of her anticonvulsants. We also discussed the risks of etoh use on more than one occasion. The patient ultimately returned to work earlier than I recommended because of concern over losing her job. The patient had a scheduled visit with me last week which she no-showed for. Unfortunately, if she had come, we might have been able to avoid the seizures/admission. This patient likely needs counseling for substance abuse as an outpatient given her ongoing etoh (and opioid) use.    Ranelle Oyster, MD, [place] [facility]

## 2018-06-24 NOTE — ED Provider Notes (Addendum)
[facility]-EMERGENCY DEPT Provider Note  CSN: [id number] Arrival date & time: 06/23/18 2340  Chief Complaint(s) Seizures  HPI Alyssa Little is a 38 y.o. female with past medical history listed below including traumatic brain injury, noted history of alcohol abuse after likely seizure at home.  This was witnessed by her significant other who report that patient was in the bathroom when he heard her fall.  When he went in the patient was stiff.  She did not have any generalized shaking.  This lasted for approximately 2 minutes.  Has been confused since.  Significant other states that she does not drink daily anymore.  States that she may have 1 glass of wine 3 times a week.  No other illicit drug use.  No known fevers or chills.  No coughing or congestion.   Remainder of history, ROS, and physical exam limited due to patient's condition (AMS). Additional information was obtained from EMS and significant other.   Level V Caveat.  Significant other reports that the patient would normally know who she is, where she is at, the time.  HPI  Past Medical History History reviewed. No pertinent past medical history. Patient Active Problem List   Diagnosis Date Noted  . Open scalp wound 04/22/2018  . Transaminitis   . TBI (traumatic brain injury) (HCC) 03/05/2018  . ETOH abuse   . Tachypnea   . Traumatic brain injury with loss of consciousness (HCC)   . Sinus tachycardia   . Seizures (HCC)   . Acute blood loss anemia   . Malnutrition of moderate degree 02/26/2018  . SAH (subarachnoid hemorrhage) (HCC) 02/24/2018   Home Medication(s) Prior to Admission medications   Medication Sig Start Date End Date Taking? Authorizing Provider  levETIRAcetam (KEPPRA) 750 MG tablet Take 2 tablets (1,500 mg total) by mouth 2 (two) times daily. Patient not taking: Reported on 06/24/2018 03/11/18   Angiulli, Mcarthur Rossetti, PA-C  Multiple Vitamin (MULTIVITAMIN WITH MINERALS) TABS  tablet Take 1 tablet by mouth daily. Patient not taking: Reported on 06/24/2018 03/11/18   Angiulli, Mcarthur Rossetti, PA-C  phenytoin (DILANTIN) 125 MG/5ML suspension Take 6 mLs (150 mg total) by mouth 3 (three) times daily. Patient not taking: Reported on 06/24/2018 03/11/18   Angiulli, Mcarthur Rossetti, PA-C                                                                                                                                    Past Surgical History History reviewed. No pertinent surgical history. Family History History reviewed. No pertinent family history.  Social History Social History   Tobacco Use  . Smoking status: Current Every Day Smoker    Packs/day: 0.25    Types: Cigarettes  . Smokeless tobacco: Never Used  Substance Use Topics  . Alcohol use: Yes    Alcohol/week: 1.0 standard drinks    Types: 1 Glasses of wine per week  Comment: 1 drink per day  . Drug use: Never   Allergies Shellfish allergy  Review of Systems Review of Systems  Unable to perform ROS: Mental status change    Physical Exam Vital Signs  I have reviewed the triage vital signs BP (!) 112/93 (BP Location: Right Arm)   Pulse (!) 132   Temp (!) 100.4 F (38 C) (Oral)   Resp 20   LMP  (LMP Unknown) Comment: negative beta HCG 06/23/18  SpO2 100%   Physical Exam Vitals signs reviewed.  Constitutional:      General: She is not in acute distress.    Appearance: She is well-developed. She is not diaphoretic.  HENT:     Head: Normocephalic and atraumatic.     Comments: Facial rosacea with dispersed pustules    Right Ear: Tympanic membrane normal.     Left Ear: Tympanic membrane normal.     Nose: Nose normal.     Mouth/Throat:     Pharynx: No oropharyngeal exudate or posterior oropharyngeal erythema.     Tonsils: No tonsillar exudate.  Eyes:     General: No scleral icterus.       Right eye: No discharge.        Left eye: No discharge.     Conjunctiva/sclera: Conjunctivae normal.     Pupils:  Pupils are equal, round, and reactive to light.  Neck:     Musculoskeletal: Normal range of motion and neck supple.  Cardiovascular:     Rate and Rhythm: Normal rate and regular rhythm.     Heart sounds: No murmur. No friction rub. No gallop.   Pulmonary:     Effort: Pulmonary effort is normal. No respiratory distress.     Breath sounds: Normal breath sounds. No stridor. No rales.  Abdominal:     General: There is no distension.     Palpations: Abdomen is soft.     Tenderness: There is no abdominal tenderness.  Musculoskeletal:        General: No tenderness.  Skin:    General: Skin is warm and dry.     Findings: No erythema or rash.  Neurological:     Mental Status: She is alert. She is disoriented.     Comments: Moves all extremities equally and with good strength.     ED Results and Treatments Labs (all labs ordered are listed, but only abnormal results are displayed) Labs Reviewed  COMPREHENSIVE METABOLIC PANEL - Abnormal; Notable for the following components:      Result Value   Potassium 3.2 (*)    CO2 21 (*)    Glucose, Bld 134 (*)    BUN <5 (*)    Calcium 8.0 (*)    AST 62 (*)    Total Bilirubin 1.3 (*)    All other components within normal limits  CBC WITH DIFFERENTIAL/PLATELET - Abnormal; Notable for the following components:   WBC 15.5 (*)    RBC 3.30 (*)    Hemoglobin 11.1 (*)    MCV 110.0 (*)    Neutro Abs 14.3 (*)    Lymphs Abs 0.4 (*)    Abs Immature Granulocytes 0.08 (*)    All other components within normal limits  URINALYSIS, ROUTINE W REFLEX MICROSCOPIC - Abnormal; Notable for the following components:   Glucose, UA 50 (*)    Hgb urine dipstick MODERATE (*)    Ketones, ur 80 (*)    Bacteria, UA RARE (*)    All other components within normal  limits  CSF CELL COUNT WITH DIFFERENTIAL - Abnormal; Notable for the following components:   Appearance, CSF CLEAR (*)    RBC Count, CSF 1 (*)    All other components within normal limits  CSF CELL COUNT  WITH DIFFERENTIAL - Abnormal; Notable for the following components:   Appearance, CSF CLEAR (*)    RBC Count, CSF 1 (*)    All other components within normal limits  GLUCOSE, CSF - Abnormal; Notable for the following components:   Glucose, CSF 92 (*)    All other components within normal limits  CSF CULTURE  CULTURE, BLOOD (ROUTINE X 2)  CULTURE, BLOOD (ROUTINE X 2)  LACTIC ACID, PLASMA  PROTIME-INR  INFLUENZA PANEL BY PCR (TYPE A & B)  RAPID URINE DRUG SCREEN, HOSP PERFORMED  PROTEIN, CSF  LACTIC ACID, PLASMA  HERPES SIMPLEX VIRUS(HSV) DNA BY PCR  ETHANOL  I-STAT BETA HCG BLOOD, ED ([facility], [facility], [facility] ONLY)                                                                                                                         EKG  EKG Interpretation  Date/Time:  Tuesday June 24 2018 01:35:48 EDT Ventricular Rate:  142 PR Interval:    QRS Duration: 83 QT Interval:  310 QTC Calculation: 477 R Axis:   48 Text Interpretation:  Sinus tachycardia Ventricular premature complex Aberrant complex Left ventricular hypertrophy No significant change since last tracing Confirmed by Drema Pry [phone]) on 06/24/2018 3:26:03 AM Also confirmed by Drema Pry [phone]), editor Elita Quick ([id number])  on 06/24/2018 6:44:39 AM      Radiology Dg Chest 2 View  Result Date: 06/24/2018 CLINICAL DATA:  Seizures.  Altered mental status.  Suspected sepsis. EXAM: CHEST - 2 VIEW COMPARISON:  02/28/2018 FINDINGS: Heart size and pulmonary vascularity are normal. Lungs are clear and expanded. No blunting of costophrenic angles. No pneumothorax. Mediastinal contours appear intact. IMPRESSION: No active cardiopulmonary disease. Electronically Signed   By: Burman Nieves M.D.   On: 06/24/2018 00:35   Ct Head Wo Contrast  Result Date: 06/24/2018 CLINICAL DATA:  Head injury on 02/24/2018 period witness seizure tonight. EXAM: CT HEAD WITHOUT CONTRAST TECHNIQUE: Contiguous axial images were obtained from the  base of the skull through the vertex without intravenous contrast. COMPARISON:  03/05/2018 FINDINGS: Brain: No evidence of acute infarction, hemorrhage, hydrocephalus, extra-axial collection or mass lesion/mass effect. Vascular: No hyperdense vessel or unexpected calcification. Skull: Calvarium appears intact. No acute depressed skull fractures. Sinuses/Orbits: Paranasal sinuses and mastoid air cells are clear. Other: None. IMPRESSION: No acute intracranial abnormalities. Electronically Signed   By: Burman Nieves M.D.   On: 06/24/2018 01:23   Pertinent labs & imaging results that were available during my care of the patient were reviewed by me and considered in my medical decision making (see chart for details).  Medications Ordered in ED Medications  sodium chloride flush (NS) 0.9 % injection 3 mL (has no administration in time range)  LORazepam (ATIVAN) tablet 1 mg (has no administration in time range)    Or  LORazepam (ATIVAN) injection 1 mg (has no administration in time range)  thiamine (VITAMIN B-1) tablet 100 mg (has no administration in time range)    Or  thiamine (B-1) injection 100 mg (has no administration in time range)  folic acid (FOLVITE) tablet 1 mg (has no administration in time range)  multivitamin with minerals tablet 1 tablet (has no administration in time range)  sodium chloride 0.9 % bolus 1,000 mL (0 mLs Intravenous Stopped 06/24/18 0530)  levETIRAcetam (KEPPRA) IVPB 1500 mg/ 100 mL premix (0 mg Intravenous Stopped 06/24/18 0201)  phenytoin (DILANTIN) 125 MG/5ML suspension 150 mg (150 mg Oral Given 06/24/18 0136)  sodium chloride 0.9 % bolus 1,000 mL (0 mLs Intravenous Stopped 06/24/18 0530)  acetaminophen (TYLENOL) tablet 1,000 mg (1,000 mg Oral Given 06/24/18 0430)  lidocaine-EPINEPHrine (XYLOCAINE W/EPI) 2 %-1:200000 (PF) injection 10 mL (10 mLs Intradermal Given 06/24/18 0532)  cefTRIAXone (ROCEPHIN) 2 g in sodium chloride 0.9 % 100 mL IVPB (0 g Intravenous Stopped  06/24/18 0459)                                                                                                                                    Procedures Procedures CRITICAL CARE Performed by: Amadeo Garnet  Total critical care time: 55 minutes Critical care time was exclusive of separately billable procedures and treating other patients. Critical care was necessary to treat or prevent imminent or life-threatening deterioration. Critical care was time spent personally by me on the following activities: development of treatment plan with patient and/or surrogate as well as nursing, discussions with consultants, evaluation of patient's response to treatment, examination of patient, obtaining history from patient or surrogate, ordering and performing treatments and interventions, ordering and review of laboratory studies, ordering and review of radiographic studies, pulse oximetry and re-evaluation of patient's condition.   (including critical care time)  Medical Decision Making / ED Course I have reviewed the nursing notes for this encounter and the patient's prior records (if available in EHR or on provided paperwork).    Patient noted to be febrile tachycardic to the 140s.  Infectious work-up initiated.  Chest x-ray without evidence of pneumonia.  UA without infection.  Patient does have leukocytosis.  No obvious source of infection at this time.  Influenza also negative.  Patient was started on empiric antibiotics.  CT head negative.  Lumbar puncture was obtained to rule out meningitis versus encephalitis.  Patient placed on CIWA, but has not required any Ativan at this time.  She was loaded with Keppra.  CSF not consistent with bacterial meningitis, but will need culture resulted.   Patient still remained altered after several hours.  Will discuss admission with hospitalist service due to prolonged postictal state.  Also to continue work-up for possible encephalitis vs alcohol  withdrawal.  Final Clinical Impression(s) / ED Diagnoses Final  diagnoses:  Seizure (HCC)  Fever, unspecified fever cause  Disorientation      This chart was dictated using voice recognition software.  Despite best efforts to proofread,  errors can occur which can change the documentation meaning.     Nira Connardama,  Eduardo, MD 06/24/18 [id number]    Nira Connardama,  Eduardo, MD 06/30/18 [phone]

## 2018-06-24 NOTE — Progress Notes (Signed)
BSE completed, full report to follow.  Pt presents with functional oropharyngeal swallow with no indication of aspiration or dysphagia. She did have acute dysphagia after her TBI in Nov 2019 - of which she does not recall.  Recommend regular/thin diet.  Thanks.   Donavan Burnet, MS [facility] SLP Acute Rehab Services Pager [phone] Office [phone]

## 2018-06-24 NOTE — Evaluation (Signed)
Clinical/Bedside Swallow Evaluation Patient Details  Name: Alyssa Little MRN: [id number] Date of Birth: 06/16/1980  Today's Date: 06/24/2018 Time: SLP Start Time (ACUTE ONLY): 1615 SLP Stop Time (ACUTE ONLY): 1625 SLP Time Calculation (min) (ACUTE ONLY): 10 min  Past Medical History:  Past Medical History:  Diagnosis Date  . Alcohol abuse   . SAH (subarachnoid hemorrhage) (HCC)   . Seizure disorder (HCC)   . TBI (traumatic brain injury) [facility])    Past Surgical History:  Past Surgical History:  Procedure Laterality Date  . NO PAST SURGERIES     HPI:  pt is a 38 yo female with h/o TBI November 2019- requiring CIR stay.  Pt now admitted with seizure.  Swallow eval ordered.  Pt did have acute dysphagia after TBI but it resolved.  Pt denies dysphagia prior to admit.  She admits she doesn't eat much.     Assessment / Plan / Recommendation Clinical Impression  Patient presents with functional oropharyngeal swallow ability.  No indication of aspiration or dysphagia apparent.  Pt with timely swallow and strong voice throughout po intake. Pt did not conduct 3 ounce water test due to nausea.  Recommend regular/thin diet - No SlP follow up needed.   SLP Visit Diagnosis: Dysphagia, unspecified (R13.10)    Aspiration Risk  No limitations    Diet Recommendation Regular;Thin liquid   Liquid Administration via: Cup;Straw Medication Administration: Whole meds with liquid Supervision: Patient able to self feed Postural Changes: Seated upright at 90 degrees    Other  Recommendations Oral Care Recommendations: Oral care BID   Follow up Recommendations None      Frequency and Duration   n/a         Prognosis   n/a     Swallow Study   General Date of Onset: 06/24/18 HPI: pt is a 38 yo female with h/o TBI November 2019- requiring CIR stay.  Pt now admitted with seizure.  Swallow eval ordered.  Pt did have acute dysphagia after TBI but it resolved.  Pt denies dysphagia prior to  admit.  She admits she doesn't eat much.   Type of Study: Bedside Swallow Evaluation Diet Prior to this Study: NPO Temperature Spikes Noted: No Respiratory Status: Room air History of Recent Intubation: No Behavior/Cognition: Alert;Cooperative;Pleasant mood Oral Cavity Assessment: Within Functional Limits Oral Care Completed by SLP: No Oral Cavity - Dentition: Adequate natural dentition Vision: Functional for self-feeding Self-Feeding Abilities: Able to feed self Patient Positioning: Upright in bed Baseline Vocal Quality: Normal Volitional Cough: Strong Volitional Swallow: Able to elicit    Oral/Motor/Sensory Function Overall Oral Motor/Sensory Function: Within functional limits   Ice Chips Ice chips: Within functional limits   Thin Liquid Thin Liquid: Within functional limits    Nectar Thick Nectar Thick Liquid: Not tested   Honey Thick Honey Thick Liquid: Not tested   Puree Puree: Within functional limits Presentation: Self Fed;Spoon   Solid     Solid: Within functional limits Presentation: Self Orvan July 06/24/2018,6:15 PM  Donavan Burnet, MS [facility] SLP Acute Rehab Services Pager [phone] Office [phone]

## 2018-06-24 NOTE — H&P (Signed)
Alyssa Little is an 38 y.o. female.   Chief Complaint: Seizures HPI: The patient is a 38 yr old woman who appears younger than her stated age. She has a past medical history significant for alcohol abuse (she has not been drinking as much lately per her boyfriend). She had a TBI, subarachnoid hemorrhage, and has a known seizure disorder for this reason. The patient had witnessed seizure at home that lasted about 2 minutes. She has been confused since that time. The patient stopped taking her seizure medications about a month ago because she does not like taking pills. Her significant other says that he thinks that her doctor told her that she could stop one of her meds, but she stopped all of them.  In the ED the patient presented with a temperature of 101.3. Respiratory rate was 20 Heart rate was 139. O2 saturation was 98% on room air.   Sodium was 135. Potassium was 3.2. CO2 was 21. BUN  Less than 5. Creatinine was 0.64. LFT's were unremarkable. WC was 15.5 Hemoglobin was 11.1. Platelets were 242.  CXR demonstrated no active cardiopulmonary disease.  Past Medical history: TBI, SAH, Seizures, malnutrition  Past Surgical History: No pertinent history  Family Medical History: Unavalable as the patient is unable to cooperate with history.  Social History:  reports that she has been smoking cigarettes. She has been smoking about 0.25 packs per day. She has never used smokeless tobacco. She reports current alcohol use of about 1.0 standard drinks of alcohol per week. She reports that she does not use drugs. (Not in a hospital admission)   Allergies:  Allergies  Allergen Reactions  . Shellfish Allergy Other (See Comments)    Unknown per family and friend    Pertinent items are noted in HPI.   General appearance: cooperative, slowed mentation and disoriented with respecto to date, time, and place. She knowns why she is here. Head: Normocephalic, without obvious abnormality,  atraumatic Eyes: conjunctivae/corneas clear. PERRL, EOM's intact. Fundi benign. Throat: lips, mucosa, and tongue normal; teeth and gums normal Neck: no adenopathy, no carotid bruit, no JVD, supple, symmetrical, trachea midline and thyroid not enlarged, symmetric, no tenderness/mass/nodules Resp: No increased work of breathing. No wheezes, rales, or rhonchi. No tactile fremitus. Chest wall: no tenderness Cardio: regular rate and rhythm, S1, S2 normal, no murmur, click, rub or gallop GI: soft, non-tender; bowel sounds normal; no masses,  no organomegaly Extremities: extremities normal, atraumatic, no cyanosis or edema Pulses: 2+ and symmetric Skin: Skin color, texture, turgor normal. No rashes or lesions Lymph nodes: Cervical, supraclavicular, and axillary nodes normal. Neurologic: Mental status: Alert, oriented, thought content appropriate, alertness: lethargic, orientation: Not oriented to time, date or place. She is oriented to person.  Results for orders placed or performed during the hospital encounter of 06/23/18 (from the past 48 hour(s))  Comprehensive metabolic panel     Status: Abnormal   Collection Time: 06/24/18 12:09 AM  Result Value Ref Range   Sodium 135 135 - 145 mmol/L   Potassium 3.2 (L) 3.5 - 5.1 mmol/L   Chloride 99 98 - 111 mmol/L   CO2 21 (L) 22 - 32 mmol/L   Glucose, Bld 134 (H) 70 - 99 mg/dL   BUN <5 (L) 6 - 20 mg/dL   Creatinine, Ser 2.77 0.44 - 1.00 mg/dL   Calcium 8.0 (L) 8.9 - 10.3 mg/dL   Total Protein 7.0 6.5 - 8.1 g/dL   Albumin 3.9 3.5 - 5.0 g/dL   AST  62 (H) 15 - 41 U/L   ALT 21 0 - 44 U/L   Alkaline Phosphatase 106 38 - 126 U/L   Total Bilirubin 1.3 (H) 0.3 - 1.2 mg/dL   GFR calc non Af Amer >60 >60 mL/min   GFR calc Af Amer >60 >60 mL/min   Anion gap 15 5 - 15    Comment: Performed at [facility], [address] [place], [place] [address]  CBC with Differential     Status: Abnormal   Collection Time: 06/24/18 12:09 AM  Result  Value Ref Range   WBC 15.5 (H) 4.0 - 10.5 K/uL   RBC 3.30 (L) 3.87 - 5.11 MIL/uL   Hemoglobin 11.1 (L) 12.0 - 15.0 g/dL   HCT 60.436.3 54.036.0 - 98.146.0 %   MCV 110.0 (H) 80.0 - 100.0 fL   MCH 33.6 26.0 - 34.0 pg   MCHC 30.6 30.0 - 36.0 g/dL   RDW 19.114.5 47.811.5 - 29.515.5 %   Platelets 242 150 - 400 K/uL   nRBC 0.0 0.0 - 0.2 %   Neutrophils Relative % 92 %   Neutro Abs 14.3 (H) 1.7 - 7.7 K/uL   Lymphocytes Relative 3 %   Lymphs Abs 0.4 (L) 0.7 - 4.0 K/uL   Monocytes Relative 4 %   Monocytes Absolute 0.7 0.1 - 1.0 K/uL   Eosinophils Relative 0 %   Eosinophils Absolute 0.0 0.0 - 0.5 K/uL   Basophils Relative 0 %   Basophils Absolute 0.0 0.0 - 0.1 K/uL   Immature Granulocytes 1 %   Abs Immature Granulocytes 0.08 (H) 0.00 - 0.07 K/uL    Comment: Performed at [facility], [address] [place], [place] [address]  Protime-INR     Status: None   Collection Time: 06/24/18 12:09 AM  Result Value Ref Range   Prothrombin Time 13.2 11.4 - 15.2 seconds   INR 1.0 0.8 - 1.2    Comment: (NOTE) INR goal varies based on device and disease states. Performed at [facility], [address] [place], [place] [address]   Lactic acid, plasma     Status: None   Collection Time: 06/24/18 12:10 AM  Result Value Ref Range   Lactic Acid, Venous 1.8 0.5 - 1.9 mmol/L    Comment: Performed at [facility], [address] [place], [place] [address]  Urinalysis, Routine w reflex microscopic     Status: Abnormal   Collection Time: 06/24/18 12:10 AM  Result Value Ref Range   Color, Urine YELLOW YELLOW   APPearance CLEAR CLEAR   Specific Gravity, Urine 1.016 1.005 - 1.030   pH 5.0 5.0 - 8.0   Glucose, UA 50 (A) NEGATIVE mg/dL   Hgb urine dipstick MODERATE (A) NEGATIVE   Bilirubin Urine NEGATIVE NEGATIVE   Ketones, ur 80 (A) NEGATIVE mg/dL   Protein, ur NEGATIVE NEGATIVE mg/dL   Nitrite NEGATIVE NEGATIVE   Leukocytes,Ua NEGATIVE NEGATIVE   RBC / HPF 0-5 0 -  5 RBC/hpf   WBC, UA 0-5 0 - 5 WBC/hpf   Bacteria, UA RARE (A) NONE SEEN   Squamous Epithelial / LPF 0-5 0 - 5   Mucus PRESENT    Hyaline Casts, UA PRESENT     Comment: Performed at [facility], [address] [place], [place] [address]  I-Stat beta hCG blood, ED     Status: None   Collection Time: 06/24/18 12:12 AM  Result Value Ref Range  I-stat hCG, quantitative <5.0 <5 mIU/mL   Comment 3            Comment:   GEST. AGE      CONC.  (mIU/mL)   <=1 WEEK        5 - 50     2 WEEKS       50 - 500     3 WEEKS       100 - 10,000     4 WEEKS     1,000 - 30,000        FEMALE AND NON-PREGNANT FEMALE:     LESS THAN 5 mIU/mL   Influenza panel by PCR (type A & B)     Status: None   Collection Time: 06/24/18 12:41 AM  Result Value Ref Range   Influenza A By PCR NEGATIVE NEGATIVE   Influenza B By PCR NEGATIVE NEGATIVE    Comment: (NOTE) The Xpert Xpress Flu assay is intended as an aid in the diagnosis of  influenza and should not be used as a sole basis for treatment.  This  assay is FDA approved for nasopharyngeal swab specimens only. Nasal  washings and aspirates are unacceptable for Xpert Xpress Flu testing. Performed at [facility], [address] [place], [place] [address]   Rapid urine drug screen (hospital performed)     Status: None   Collection Time: 06/24/18  3:26 AM  Result Value Ref Range   Opiates NONE DETECTED NONE DETECTED   Cocaine NONE DETECTED NONE DETECTED   Benzodiazepines NONE DETECTED NONE DETECTED   Amphetamines NONE DETECTED NONE DETECTED   Tetrahydrocannabinol NONE DETECTED NONE DETECTED   Barbiturates NONE DETECTED NONE DETECTED    Comment: (NOTE) DRUG SCREEN FOR MEDICAL PURPOSES ONLY.  IF CONFIRMATION IS NEEDED FOR ANY PURPOSE, NOTIFY LAB WITHIN 5 DAYS. LOWEST DETECTABLE LIMITS FOR URINE DRUG SCREEN Drug Class                     Cutoff (ng/mL) Amphetamine and metabolites    1000 Barbiturate and metabolites     200 Benzodiazepine                 200 Tricyclics and metabolites     300 Opiates and metabolites        300 Cocaine and metabolites        300 THC                            50 Performed at [facility], [address], [place], [place] [address]   CSF cell count with differential collection tube #: 1     Status: Abnormal   Collection Time: 06/24/18  5:30 AM  Result Value Ref Range   Tube # 1    Color, CSF COLORLESS COLORLESS   Appearance, CSF CLEAR (A) CLEAR   Supernatant NOT INDICATED    RBC Count, CSF 1 (H) 0 /cu mm   WBC, CSF 1 0 - 5 /cu mm   Other Cells, CSF TOO FEW TO COUNT, SMEAR AVAILABLE FOR REVIEW     Comment: RARE SEGMENTED NEUTROPHILS, LYMPHOCYTES, AND MONOCYTES PRESENT. Performed at [facility], [address] [place], [place] [address]   CSF cell count with differential collection tube #: 4     Status: Abnormal   Collection Time: 06/24/18  5:30 AM  Result Value Ref Range   Tube # 4  Color, CSF COLORLESS COLORLESS   Appearance, CSF CLEAR (A) CLEAR   Supernatant NOT INDICATED    RBC Count, CSF 1 (H) 0 /cu mm   WBC, CSF 1 0 - 5 /cu mm   Other Cells, CSF TOO FEW TO COUNT, SMEAR AVAILABLE FOR REVIEW     Comment: RARE LYMPHOCYTES AND MONOCYTES PRESENT. Performed at [facility], [address], [place], [place] [address]   CSF culture     Status: None (Preliminary result)   Collection Time: 06/24/18  5:30 AM  Result Value Ref Range   Specimen Description CSF    Special Requests NONE    Gram Stain      WBC PRESENT,BOTH PMN AND MONONUCLEAR NO ORGANISMS SEEN Gram Stain Report Called to,Read Back By and Verified With: HODGES,I. RN @0647  ON 3.10.2020 BY [facility] Performed at [facility], [address], [place], [place] [address]    Culture PENDING    Report Status PENDING   Glucose, CSF     Status: Abnormal   Collection Time: 06/24/18  5:30 AM  Result Value Ref Range   Glucose, CSF  92 (H) 40 - 70 mg/dL    Comment: Performed at [facility], [address], [place], [place] [address]  Protein, CSF     Status: None   Collection Time: 06/24/18  5:30 AM  Result Value Ref Range   Total  Protein, CSF 41 15 - 45 mg/dL    Comment: Performed at [facility], [address], [place], [place] [address]  Ethanol     Status: None   Collection Time: 06/24/18  7:28 AM  Result Value Ref Range   Alcohol, Ethyl (B) <10 <10 mg/dL    Comment: (NOTE) Lowest detectable limit for serum alcohol is 10 mg/dL. For medical purposes only. Performed at [facility], [address], [place], [place] [address]    @[email or web address] @  Blood pressure 98/79, pulse (!) 119, temperature (!) 100.4 F (38 C), temperature source Oral, resp. rate (!) 30, SpO2 98 %.   Assessment/Plan Acute Encephalopathy Seizures Medical Noncompliance Transaminitis Mildly elevated lactic acid Alcohol Abuse Moderate malnutrition  Acute Encephalopathy: CT negative for acute pathology. Infectious work up so far not revealing. The patient has received IV rocephin 2 gm empirically. LP appeared negative for bacterial meningitis on the bases of cell count and chemistry. CSF Cultures are pending. Blood cultures are pending. CXR and UA are negative.   Seizures: The patient has been loaded with Keppra and restarted on her usual dose. Her depakote has also been restarted.  Transaminitis: Mildly elevated bilirubin and AST. Possibly due to alcohol abuse. Possibly reactive.   Mild Lactic Acidosis: Likely due to seizures, although source of infection is being ruled out. Cultures pending. Patient is receiving IV fluids and empiric antibiotics.  Moderate malnutrition: Nutrition will be consulted.  Alyssa Little 06/24/2018, 8:07 AM

## 2018-06-24 NOTE — Progress Notes (Signed)
Pharmacy Antibiotic Note  Alyssa Little is a 38 y.o. female admitted on 06/23/2018 with seizures.  Pharmacy has been consulted for vancomycin dosing.  Pt admitted with witnessed seizure. MD ordered broad spectrum antibiotics for possible meningitis.   Today, 06/24/18  WBC 15.5  SCr 0.6 - WNL, CrCl ~70 mL/min  Tmax 101.3  Plan:  CTX 2 g IV q12h per MD  Vancomycin 1250 mg IV LD followed by 750 mg IV q12h  Goal VT 15-20 mcg/mL  Follow renal function closely  Check VT at steady state  Height: 5\' 2"  (157.5 cm) Weight: 105 lb (47.6 kg) IBW/kg (Calculated) : 50.1  Temp (24hrs), Avg:100.4 F (38 C), Min:99.3 F (37.4 C), Max:101.3 F (38.5 C)  Recent Labs  Lab 06/24/18 0009 06/24/18 0010 06/24/18 0937  WBC 15.5*  --   --   CREATININE 0.64  --   --   LATICACIDVEN  --  1.8 0.8    Estimated Creatinine Clearance: 71.6 mL/min (by C-G formula based on SCr of 0.64 mg/dL).    Allergies  Allergen Reactions  . Shellfish Allergy Other (See Comments)    Unknown per family and friend   Antimicrobials this admission: vancomycin 3/10 >>  ceftriaxone 3/10 >>   Dose adjustments this admission:  Microbiology results: 3/10 BCx: Sent 3/10 CSF: Pending  Thank you for allowing pharmacy to be a part of this patient's care.  Cindi Carbon, PharmD 06/24/2018 12:48 PM

## 2018-06-24 NOTE — ED Notes (Signed)
ED TO INPATIENT HANDOFF REPORT  ED Nurse Name and Phone #: Chip Boer [phone]  S Name/Age/Gender Alyssa Little 38 y.o. female Room/Bed: WA07/WA07  Code Status   Code Status: Full Code  Home/SNF/Other Home Patient oriented to: self, place, time and situation Is this baseline? Yes   Triage Complete: Triage complete  Chief Complaint seizures  Triage Note Per EMS, Pt is coming from home. Pt had a witnessed seizure, pt had a focal seizure. Htx of major head injury in November, no specific detail from witness at scene. Pt baseline unknown, pt still post ictal, recently d/c kepra. Bruise noted on right shoulder.    Allergies Allergies  Allergen Reactions  . Shellfish Allergy Other (See Comments)    Unknown per family and friend    Level of Care/Admitting Diagnosis ED Disposition    ED Disposition Condition Comment   Admit  Hospital Area: [facility] [[id number]]  Level of Care: Med-Surg [16]  Diagnosis: Encephalopathy acute [147829]  Admitting Physician: Fran Lowes [phone]  Attending Physician: Gerri Lins, AVA [[id number]]  Estimated length of stay: 3 - 4 days  Certification:: I certify this patient will need inpatient services for at least 2 midnights  PT Class (Do Not Modify): Inpatient [101]  PT Acc Code (Do Not Modify): Private [1]       B Medical/Surgery History History reviewed. No pertinent past medical history. History reviewed. No pertinent surgical history.   A IV Location/Drains/Wounds Patient Lines/Drains/Airways Status   Active Line/Drains/Airways    Name:   Placement date:   Placement time:   Site:   Days:   Peripheral IV 06/23/18 Right Wrist   06/23/18    2338    Wrist   1   Peripheral IV 06/24/18 Right Antecubital   06/24/18    0141    Antecubital   less than 1   Wound / Incision (Open or Dehisced) 02/24/18 Laceration Head Lateral;Right;Upper   02/24/18    2305    Head   120          Intake/Output Last 24 hours  Intake/Output  Summary (Last 24 hours) at 06/24/2018 0836 Last data filed at 06/24/2018 0530 Gross per 24 hour  Intake 2187.78 ml  Output -  Net 2187.78 ml    Labs/Imaging Results for orders placed or performed during the hospital encounter of 06/23/18 (from the past 48 hour(s))  Comprehensive metabolic panel     Status: Abnormal   Collection Time: 06/24/18 12:09 AM  Result Value Ref Range   Sodium 135 135 - 145 mmol/L   Potassium 3.2 (L) 3.5 - 5.1 mmol/L   Chloride 99 98 - 111 mmol/L   CO2 21 (L) 22 - 32 mmol/L   Glucose, Bld 134 (H) 70 - 99 mg/dL   BUN <5 (L) 6 - 20 mg/dL   Creatinine, Ser 3.08 0.44 - 1.00 mg/dL   Calcium 8.0 (L) 8.9 - 10.3 mg/dL   Total Protein 7.0 6.5 - 8.1 g/dL   Albumin 3.9 3.5 - 5.0 g/dL   AST 62 (H) 15 - 41 U/L   ALT 21 0 - 44 U/L   Alkaline Phosphatase 106 38 - 126 U/L   Total Bilirubin 1.3 (H) 0.3 - 1.2 mg/dL   GFR calc non Af Amer >60 >60 mL/min   GFR calc Af Amer >60 >60 mL/min   Anion gap 15 5 - 15    Comment: Performed at [facility], [address] [place], [place] [address]  CBC with Differential     Status: Abnormal   Collection Time: 06/24/18 12:09 AM  Result Value Ref Range   WBC 15.5 (H) 4.0 - 10.5 K/uL   RBC 3.30 (L) 3.87 - 5.11 MIL/uL   Hemoglobin 11.1 (L) 12.0 - 15.0 g/dL   HCT 64.6 80.3 - 21.2 %   MCV 110.0 (H) 80.0 - 100.0 fL   MCH 33.6 26.0 - 34.0 pg   MCHC 30.6 30.0 - 36.0 g/dL   RDW 24.8 25.0 - 03.7 %   Platelets 242 150 - 400 K/uL   nRBC 0.0 0.0 - 0.2 %   Neutrophils Relative % 92 %   Neutro Abs 14.3 (H) 1.7 - 7.7 K/uL   Lymphocytes Relative 3 %   Lymphs Abs 0.4 (L) 0.7 - 4.0 K/uL   Monocytes Relative 4 %   Monocytes Absolute 0.7 0.1 - 1.0 K/uL   Eosinophils Relative 0 %   Eosinophils Absolute 0.0 0.0 - 0.5 K/uL   Basophils Relative 0 %   Basophils Absolute 0.0 0.0 - 0.1 K/uL   Immature Granulocytes 1 %   Abs Immature Granulocytes 0.08 (H) 0.00 - 0.07 K/uL    Comment: Performed at [facility],  [address], [place], [place] [address]  Protime-INR     Status: None   Collection Time: 06/24/18 12:09 AM  Result Value Ref Range   Prothrombin Time 13.2 11.4 - 15.2 seconds   INR 1.0 0.8 - 1.2    Comment: (NOTE) INR goal varies based on device and disease states. Performed at [facility], [address] [place], [place] [address]   Lactic acid, plasma     Status: None   Collection Time: 06/24/18 12:10 AM  Result Value Ref Range   Lactic Acid, Venous 1.8 0.5 - 1.9 mmol/L    Comment: Performed at [facility], [address] [place], [place] [address]  Urinalysis, Routine w reflex microscopic     Status: Abnormal   Collection Time: 06/24/18 12:10 AM  Result Value Ref Range   Color, Urine YELLOW YELLOW   APPearance CLEAR CLEAR   Specific Gravity, Urine 1.016 1.005 - 1.030   pH 5.0 5.0 - 8.0   Glucose, UA 50 (A) NEGATIVE mg/dL   Hgb urine dipstick MODERATE (A) NEGATIVE   Bilirubin Urine NEGATIVE NEGATIVE   Ketones, ur 80 (A) NEGATIVE mg/dL   Protein, ur NEGATIVE NEGATIVE mg/dL   Nitrite NEGATIVE NEGATIVE   Leukocytes,Ua NEGATIVE NEGATIVE   RBC / HPF 0-5 0 - 5 RBC/hpf   WBC, UA 0-5 0 - 5 WBC/hpf   Bacteria, UA RARE (A) NONE SEEN   Squamous Epithelial / LPF 0-5 0 - 5   Mucus PRESENT    Hyaline Casts, UA PRESENT     Comment: Performed at [facility], [address] [place], [place] [address]  I-Stat beta hCG blood, ED     Status: None   Collection Time: 06/24/18 12:12 AM  Result Value Ref Range   I-stat hCG, quantitative <5.0 <5 mIU/mL   Comment 3            Comment:   GEST. AGE      CONC.  (mIU/mL)   <=1 WEEK        5 - 50     2 WEEKS       50 - 500     3 WEEKS       100 - 10,000  4 WEEKS     1,000 - 30,000        FEMALE AND NON-PREGNANT FEMALE:     LESS THAN 5 mIU/mL   Influenza panel by PCR (type A & B)     Status: None   Collection Time: 06/24/18 12:41 AM  Result Value Ref Range   Influenza A By  PCR NEGATIVE NEGATIVE   Influenza B By PCR NEGATIVE NEGATIVE    Comment: (NOTE) The Xpert Xpress Flu assay is intended as an aid in the diagnosis of  influenza and should not be used as a sole basis for treatment.  This  assay is FDA approved for nasopharyngeal swab specimens only. Nasal  washings and aspirates are unacceptable for Xpert Xpress Flu testing. Performed at [facility], [address], [place], [place] [address]   Rapid urine drug screen (hospital performed)     Status: None   Collection Time: 06/24/18  3:26 AM  Result Value Ref Range   Opiates NONE DETECTED NONE DETECTED   Cocaine NONE DETECTED NONE DETECTED   Benzodiazepines NONE DETECTED NONE DETECTED   Amphetamines NONE DETECTED NONE DETECTED   Tetrahydrocannabinol NONE DETECTED NONE DETECTED   Barbiturates NONE DETECTED NONE DETECTED    Comment: (NOTE) DRUG SCREEN FOR MEDICAL PURPOSES ONLY.  IF CONFIRMATION IS NEEDED FOR ANY PURPOSE, NOTIFY LAB WITHIN 5 DAYS. LOWEST DETECTABLE LIMITS FOR URINE DRUG SCREEN Drug Class                     Cutoff (ng/mL) Amphetamine and metabolites    1000 Barbiturate and metabolites    200 Benzodiazepine                 200 Tricyclics and metabolites     300 Opiates and metabolites        300 Cocaine and metabolites        300 THC                            50 Performed at [facility], [address] [place], [place] [address]   CSF cell count with differential collection tube #: 1     Status: Abnormal   Collection Time: 06/24/18  5:30 AM  Result Value Ref Range   Tube # 1    Color, CSF COLORLESS COLORLESS   Appearance, CSF CLEAR (A) CLEAR   Supernatant NOT INDICATED    RBC Count, CSF 1 (H) 0 /cu mm   WBC, CSF 1 0 - 5 /cu mm   Other Cells, CSF TOO FEW TO COUNT, SMEAR AVAILABLE FOR REVIEW     Comment: RARE SEGMENTED NEUTROPHILS, LYMPHOCYTES, AND MONOCYTES PRESENT. Performed at [facility], [address],  [place], [place] [address]   CSF cell count with differential collection tube #: 4     Status: Abnormal   Collection Time: 06/24/18  5:30 AM  Result Value Ref Range   Tube # 4    Color, CSF COLORLESS COLORLESS   Appearance, CSF CLEAR (A) CLEAR   Supernatant NOT INDICATED    RBC Count, CSF 1 (H) 0 /cu mm   WBC, CSF 1 0 - 5 /cu mm   Other Cells, CSF TOO FEW TO COUNT, SMEAR AVAILABLE FOR REVIEW     Comment: RARE LYMPHOCYTES AND MONOCYTES PRESENT. Performed at [facility], [address], [place], [place] [address]   CSF culture  Status: None (Preliminary result)   Collection Time: 06/24/18  5:30 AM  Result Value Ref Range   Specimen Description CSF    Special Requests NONE    Gram Stain      WBC PRESENT,BOTH PMN AND MONONUCLEAR NO ORGANISMS SEEN Gram Stain Report Called to,Read Back By and Verified With: HODGES,I. RN  ON 3.10.2020 BY [facility] Performed at [facility], [address], [place], [place] [address]    Culture PENDING    Report Status PENDING   Glucose, CSF     Status: Abnormal   Collection Time: 06/24/18  5:30 AM  Result Value Ref Range   Glucose, CSF 92 (H) 40 - 70 mg/dL    Comment: Performed at [facility], [address] [place], [place] [address]  Protein, CSF     Status: None   Collection Time: 06/24/18  5:30 AM  Result Value Ref Range   Total  Protein, CSF 41 15 - 45 mg/dL    Comment: Performed at [facility], [address] [place], [place] [address]  Ethanol     Status: None   Collection Time: 06/24/18  7:28 AM  Result Value Ref Range   Alcohol, Ethyl (B) <10 <10 mg/dL    Comment: (NOTE) Lowest detectable limit for serum alcohol is 10 mg/dL. For medical purposes only. Performed at [facility], [address] [place], [place] [address]    Dg Chest 2 View  Result Date: 06/24/2018 CLINICAL DATA:  Seizures.  Altered mental status.  Suspected sepsis.  EXAM: CHEST - 2 VIEW COMPARISON:  02/28/2018 FINDINGS: Heart size and pulmonary vascularity are normal. Lungs are clear and expanded. No blunting of costophrenic angles. No pneumothorax. Mediastinal contours appear intact. IMPRESSION: No active cardiopulmonary disease. Electronically Signed   By: Burman Nieves M.D.   On: 06/24/2018 00:35   Ct Head Wo Contrast  Result Date: 06/24/2018 CLINICAL DATA:  Head injury on 02/24/2018 period witness seizure tonight. EXAM: CT HEAD WITHOUT CONTRAST TECHNIQUE: Contiguous axial images were obtained from the base of the skull through the vertex without intravenous contrast. COMPARISON:  03/05/2018 FINDINGS: Brain: No evidence of acute infarction, hemorrhage, hydrocephalus, extra-axial collection or mass lesion/mass effect. Vascular: No hyperdense vessel or unexpected calcification. Skull: Calvarium appears intact. No acute depressed skull fractures. Sinuses/Orbits: Paranasal sinuses and mastoid air cells are clear. Other: None. IMPRESSION: No acute intracranial abnormalities. Electronically Signed   By: Burman Nieves M.D.   On: 06/24/2018 01:23    Pending Labs Unresulted Labs (From admission, onward)    Start     Ordered   06/24/18 0336  Herpes simplex virus (HSV), DNA by PCR Cerebrospinal Fluid  [facility] ED ADULT CSF PANEL)  ONCE - STAT,   STAT     06/24/18 0336   06/24/18 0010  Lactic acid, plasma  Now then every 2 hours,   STAT     06/24/18 0009   06/24/18 0010  Culture, blood (Routine x 2)  BLOOD CULTURE X 2,   STAT     06/24/18 0009          Vitals/Pain Today's Vitals   06/24/18 0700 06/24/18 0730 06/24/18 0743 06/24/18 0830  BP: 102/89 98/79  (!) 111/91  Pulse: (!) 127 (!) 119  (!) 119  Resp: (!) 29 (!) 30  (!) 25  Temp:      TempSrc:      SpO2: 98% 98%  98%  PainSc:   0-No pain  Isolation Precautions Droplet precaution  Medications Medications  LORazepam (ATIVAN) tablet 1 mg (has no administration in time range)    Or  LORazepam  (ATIVAN) injection 1 mg (has no administration in time range)  thiamine (VITAMIN B-1) tablet 100 mg (has no administration in time range)    Or  thiamine (B-1) injection 100 mg (has no administration in time range)  folic acid (FOLVITE) tablet 1 mg (has no administration in time range)  multivitamin with minerals tablet 1 tablet (has no administration in time range)  0.9 %  sodium chloride infusion ( Intravenous New Bag/Given 06/24/18 0819)  ondansetron (ZOFRAN) tablet 4 mg (has no administration in time range)    Or  ondansetron (ZOFRAN) injection 4 mg (has no administration in time range)  levETIRAcetam (KEPPRA) tablet 1,500 mg (has no administration in time range)  phenytoin (DILANTIN) 125 MG/5ML suspension 150 mg (has no administration in time range)  sodium chloride flush (NS) 0.9 % injection 3 mL (3 mLs Intravenous Given 06/24/18 0735)  sodium chloride 0.9 % bolus 1,000 mL (0 mLs Intravenous Stopped 06/24/18 0530)  levETIRAcetam (KEPPRA) IVPB 1500 mg/ 100 mL premix (0 mg Intravenous Stopped 06/24/18 0201)  phenytoin (DILANTIN) 125 MG/5ML suspension 150 mg (150 mg Oral Given 06/24/18 0136)  sodium chloride 0.9 % bolus 1,000 mL (0 mLs Intravenous Stopped 06/24/18 0530)  acetaminophen (TYLENOL) tablet 1,000 mg (1,000 mg Oral Given 06/24/18 0430)  lidocaine-EPINEPHrine (XYLOCAINE W/EPI) 2 %-1:200000 (PF) injection 10 mL (10 mLs Intradermal Given 06/24/18 0532)  cefTRIAXone (ROCEPHIN) 2 g in sodium chloride 0.9 % 100 mL IVPB (0 g Intravenous Stopped 06/24/18 0459)    Mobility walks High fall risk   Focused Assessments Neuro Assessment Handoff:  Swallow screen pass? Yes  Cardiac Rhythm: Sinus tachycardia       Neuro Assessment: Exceptions to WDL Neuro Checks:      Last Documented NIHSS Modified Score:   Has TPA been given? No If patient is a Neuro Trauma and patient is going to OR before floor call report to 4N Charge nurse: [phone] or [phone]     R Recommendations: See  Admitting Provider Note  Report given to:   Additional Notes:

## 2018-06-24 NOTE — ED Notes (Signed)
Patient transported to X-ray 

## 2018-06-24 NOTE — Progress Notes (Signed)
Pt refused to take HS meds and receive IV abx.  Pt stated "I just want to go home and be in my own bed." Pt requested to leave unit AMA. Patient's VS is as stated: T 99.1, BP 113/94, P 112, R 17 and O2 100%. Pt was advised to stay away from people that are sick due to WBC being 15.5. Pt was reminded of the risks she is taking by leaving against medical advice. Pt complains of no pain or discomfort at discharge. Pt left unit with female companion at 2335.

## 2018-06-24 NOTE — Progress Notes (Signed)
Pt expressed the desire to go home this evening  When her female friend gets off of work. I spoke to Dr Lavera Guise MD to make her aware .She stated that she will not talk Ms Mckinsey into staying that if Pt wasn't to leave against medical advise then she can leave. Ms Junk stated that she did not want to be herer that she only had a sz  Because she fell and hiot her head,

## 2018-06-26 LAB — HSV DNA BY PCR (REFERENCE LAB)
HSV 1 DNA: NEGATIVE
HSV 2 DNA: NEGATIVE

## 2018-06-27 LAB — CSF CULTURE W GRAM STAIN: Culture: NO GROWTH

## 2018-06-27 LAB — CSF CULTURE

## 2018-06-27 NOTE — Discharge Summary (Signed)
Physician Discharge Summary  Yena Tisby WUJ:[id number] DOB: [birth year] DOA: 06/23/2018  PCP: Ranelle Oyster, MD  Admit date: 06/23/2018 Discharge date: 06/27/2018  Recommendations for Outpatient Follow-up:  1. Patient left AMA   Discharge Diagnoses: Principal diagnosis is #1 1. Altered mental status 2. Seizures 3. Mild lactic acidosis 4. Mildly elevated LFT's 5. Medical non-compliance  Discharge Condition: Unknown. Disposition: Left AMA  Diet recommendation: left AMA  Filed Weights   06/24/18 0906  Weight: 47.6 kg    History of present illness:  The patient is a 38 yr old woman who appears younger than her stated age. She has a past medical history significant for alcohol abuse (she has not been drinking as much lately per her boyfriend). She had a TBI, subarachnoid hemorrhage, and has a known seizure disorder for this reason. The patient had witnessed seizure at home that lasted about 2 minutes. She has been confused since that time. The patient stopped taking her seizure medications about a month ago because she does not like taking pills. Her significant other says that he thinks that her doctor told her that she could stop one of her meds, but she stopped all of them.  In the ED the patient presented with a temperature of 101.3. Respiratory rate was 20 Heart rate was 139. O2 saturation was 98% on room air.   Sodium was 135. Potassium was 3.2. CO2 was 21. BUN  Less than 5. Creatinine was 0.64. LFT's were unremarkable. WC was 15.5 Hemoglobin was 11.1. Platelets were 242.  CXR demonstrated no active cardiopulmonary disease.  Hospital Course: The patient was admitted and loaded with depakote and keppra. She was admitted with an ongoing dose of these medications.   Today's assessment: Please see physical exam in H&P documented earlier today for physical exam on the date the patient left AMA. Vitals:  Vitals:   06/24/18 1705 06/24/18 2223  BP: 105/81 (!)  113/94  Pulse: 98 (!) 112  Resp: 20 17  Temp: 99.3 F (37.4 C) 99.1 F (37.3 C)  SpO2: 97% 100%    Discharge Instructions  The patient left AMA. There was no opportunity to give discharge instructions.  Allergies as of 06/24/2018      Reactions   Shellfish Allergy Other (See Comments)   Unknown per family and friend      Medication List    ASK your doctor about these medications   levETIRAcetam 750 MG tablet Commonly known as:  KEPPRA Take 2 tablets (1,500 mg total) by mouth 2 (two) times daily.   multivitamin with minerals Tabs tablet Take 1 tablet by mouth daily.   phenytoin 125 MG/5ML suspension Commonly known as:  DILANTIN Take 6 mLs (150 mg total) by mouth 3 (three) times daily.      Allergies  Allergen Reactions   Shellfish Allergy Other (See Comments)    Unknown per family and friend    The results of significant diagnostics from this hospitalization (including imaging, microbiology, ancillary and laboratory) are listed below for reference.    Significant Diagnostic Studies: Dg Chest 2 View  Result Date: 06/24/2018 CLINICAL DATA:  Seizures.  Altered mental status.  Suspected sepsis. EXAM: CHEST - 2 VIEW COMPARISON:  02/28/2018 FINDINGS: Heart size and pulmonary vascularity are normal. Lungs are clear and expanded. No blunting of costophrenic angles. No pneumothorax. Mediastinal contours appear intact. IMPRESSION: No active cardiopulmonary disease. Electronically Signed   By: Burman Nieves M.D.   On: 06/24/2018 00:35   Ct Head Wo Contrast  Result Date: 06/24/2018 CLINICAL DATA:  Head injury on 02/24/2018 period witness seizure tonight. EXAM: CT HEAD WITHOUT CONTRAST TECHNIQUE: Contiguous axial images were obtained from the base of the skull through the vertex without intravenous contrast. COMPARISON:  03/05/2018 FINDINGS: Brain: No evidence of acute infarction, hemorrhage, hydrocephalus, extra-axial collection or mass lesion/mass effect. Vascular: No  hyperdense vessel or unexpected calcification. Skull: Calvarium appears intact. No acute depressed skull fractures. Sinuses/Orbits: Paranasal sinuses and mastoid air cells are clear. Other: None. IMPRESSION: No acute intracranial abnormalities. Electronically Signed   By: Burman Nieves M.D.   On: 06/24/2018 01:23    Microbiology: Recent Results (from the past 240 hour(s))  Culture, blood (Routine x 2)     Status: None (Preliminary result)   Collection Time: 06/24/18 12:10 AM  Result Value Ref Range Status   Specimen Description   Final    BLOOD RIGHT ANTECUBITAL Performed at [facility], [address] [place], [place] [address]    Special Requests   Final    BOTTLES DRAWN AEROBIC AND ANAEROBIC Blood Culture adequate volume Performed at [facility], [address] [place], [place] [address]    Culture   Final    NO GROWTH 3 DAYS Performed at [facility], [address], [place], [place] [address]    Report Status PENDING  Incomplete  Culture, blood (Routine x 2)     Status: None (Preliminary result)   Collection Time: 06/24/18  3:13 AM  Result Value Ref Range Status   Specimen Description   Final    BLOOD LEFT ANTECUBITAL Performed at [facility], [address] [place], [place] [address]    Special Requests   Final    BOTTLES DRAWN AEROBIC AND ANAEROBIC Blood Culture results may not be optimal due to an excessive volume of blood received in culture bottles Performed at [facility], [address], [place], [place] [address]    Culture   Final    NO GROWTH 3 DAYS Performed at [facility], [address] [place], [place] [address]    Report Status PENDING  Incomplete  CSF culture     Status: None   Collection Time: 06/24/18  5:30 AM  Result Value Ref Range Status   Specimen Description   Final    CSF Performed at [facility], [address],  [place], [place] [address]    Special Requests   Final    NONE Performed at [facility], [address] [place], [place] [address]    Gram Stain   Final    WBC PRESENT,BOTH PMN AND MONONUCLEAR NO ORGANISMS SEEN Gram Stain Report Called to,Read Back By and Verified With: HODGES,I. RN @0647  ON 3.10.2020 BY [facility] GRAM STAIN REVIEWED-AGREE WITH RESULT    Culture   Final    NO GROWTH 3 DAYS Performed at [facility], [address], [place], [place] [address]    Report Status 06/27/2018 FINAL  Final     Labs: Basic Metabolic Panel: Recent Labs  Lab 06/24/18 0009  NA 135  K 3.2*  CL 99  CO2 21*  GLUCOSE 134*  BUN <5*  CREATININE 0.64  CALCIUM 8.0*   Liver Function Tests: Recent Labs  Lab 06/24/18 0009  AST 62*  ALT 21  ALKPHOS 106  BILITOT 1.3*  PROT 7.0  ALBUMIN 3.9   No results for input(s): LIPASE, AMYLASE in the last 168 hours.  No results for input(s): AMMONIA in the last 168 hours. CBC: Recent Labs  Lab 06/24/18 0009  WBC 15.5*  NEUTROABS 14.3*  HGB 11.1*  HCT 36.3  MCV 110.0*  PLT 242   Cardiac Enzymes: No results for input(s): CKTOTAL, CKMB, CKMBINDEX, TROPONINI in the last 168 hours. BNP: BNP (last 3 results) No results for input(s): BNP in the last 8760 hours.  ProBNP (last 3 results) No results for input(s): PROBNP in the last 8760 hours.  CBG: No results for input(s): GLUCAP in the last 168 hours.  Active Problems:   Malnutrition of moderate degree   Seizures (HCC)   Transaminitis   Encephalopathy acute   Lactic acidemia   Time coordinating discharge: 30  Signed:        Nicholai Willette, DO [facility]  06/27/2018, 6:10 PM

## 2018-06-29 LAB — CULTURE, BLOOD (ROUTINE X 2)
Culture: NO GROWTH
Culture: NO GROWTH
Special Requests: ADEQUATE

## 2018-07-02 ENCOUNTER — Telehealth: Payer: Self-pay | Admitting: Physical Medicine & Rehabilitation

## 2018-07-02 NOTE — Telephone Encounter (Signed)
Beverly with the [employer] called and chastised why we had not return the Return to work documents.  I advised her that patient missed last appt with zs possibly causing her to be admitted to ED due to failure to take seizure meds.  She then missed a follow up after that.  He cannot give an opinion on her return to work as she has been noncompliant and he is not last MD to see her - do not reschedule without md approval.    LVM ON confidential voicemail per [place] because they are all having to work from home and cannot answer phones.

## 2018-09-06 ENCOUNTER — Encounter (HOSPITAL_COMMUNITY): Payer: Self-pay

## 2018-09-06 ENCOUNTER — Emergency Department (HOSPITAL_COMMUNITY)
Admission: EM | Admit: 2018-09-06 | Discharge: 2018-09-07 | Disposition: A | Discharge status: Home / Self Care | Payer: Managed Care, Other (non HMO) | Attending: Emergency Medicine | Admitting: Emergency Medicine

## 2018-09-06 ENCOUNTER — Emergency Department (HOSPITAL_COMMUNITY): Payer: Managed Care, Other (non HMO)

## 2018-09-06 ENCOUNTER — Other Ambulatory Visit: Payer: Self-pay

## 2018-09-06 DIAGNOSIS — F17210 Nicotine dependence, cigarettes, uncomplicated: Secondary | ICD-10-CM | POA: Insufficient documentation

## 2018-09-06 DIAGNOSIS — R748 Abnormal levels of other serum enzymes: Secondary | ICD-10-CM | POA: Diagnosis not present

## 2018-09-06 DIAGNOSIS — R569 Unspecified convulsions: Secondary | ICD-10-CM

## 2018-09-06 DIAGNOSIS — E876 Hypokalemia: Secondary | ICD-10-CM

## 2018-09-06 DIAGNOSIS — Z79899 Other long term (current) drug therapy: Secondary | ICD-10-CM | POA: Insufficient documentation

## 2018-09-06 LAB — CBC WITH DIFFERENTIAL/PLATELET
Abs Immature Granulocytes: 0.03 10*3/uL (ref 0.00–0.07)
Basophils Absolute: 0.1 10*3/uL (ref 0.0–0.1)
Basophils Relative: 1 %
Eosinophils Absolute: 0 10*3/uL (ref 0.0–0.5)
Eosinophils Relative: 0 %
HCT: 33.2 % — ABNORMAL LOW (ref 36.0–46.0)
Hemoglobin: 11.3 g/dL — ABNORMAL LOW (ref 12.0–15.0)
Immature Granulocytes: 0 %
Lymphocytes Relative: 14 %
Lymphs Abs: 1 10*3/uL (ref 0.7–4.0)
MCH: 37.8 pg — ABNORMAL HIGH (ref 26.0–34.0)
MCHC: 34 g/dL (ref 30.0–36.0)
MCV: 111 fL — ABNORMAL HIGH (ref 80.0–100.0)
Monocytes Absolute: 0.7 10*3/uL (ref 0.1–1.0)
Monocytes Relative: 9 %
Neutro Abs: 5.4 10*3/uL (ref 1.7–7.7)
Neutrophils Relative %: 76 %
Platelets: 193 10*3/uL (ref 150–400)
RBC: 2.99 MIL/uL — ABNORMAL LOW (ref 3.87–5.11)
RDW: 14.8 % (ref 11.5–15.5)
WBC: 7.2 10*3/uL (ref 4.0–10.5)
nRBC: 0 % (ref 0.0–0.2)

## 2018-09-06 LAB — COMPREHENSIVE METABOLIC PANEL
ALT: 71 U/L — ABNORMAL HIGH (ref 0–44)
AST: 164 U/L — ABNORMAL HIGH (ref 15–41)
Albumin: 3.9 g/dL (ref 3.5–5.0)
Alkaline Phosphatase: 100 U/L (ref 38–126)
Anion gap: 14 (ref 5–15)
BUN: 5 mg/dL — ABNORMAL LOW (ref 6–20)
CO2: 25 mmol/L (ref 22–32)
Calcium: 8.8 mg/dL — ABNORMAL LOW (ref 8.9–10.3)
Chloride: 97 mmol/L — ABNORMAL LOW (ref 98–111)
Creatinine, Ser: 0.45 mg/dL (ref 0.44–1.00)
GFR calc Af Amer: >60 mL/min (ref >60)
GFR calc non Af Amer: >60 mL/min (ref >60)
Glucose, Bld: 109 mg/dL — ABNORMAL HIGH (ref 70–99)
Potassium: 3.1 mmol/L — ABNORMAL LOW (ref 3.5–5.1)
Sodium: 136 mmol/L (ref 135–145)
Total Bilirubin: 0.5 mg/dL (ref 0.3–1.2)
Total Protein: 7 g/dL (ref 6.5–8.1)

## 2018-09-06 LAB — URINALYSIS, ROUTINE W REFLEX MICROSCOPIC
Bilirubin Urine: NEGATIVE
Glucose, UA: NEGATIVE mg/dL
Hgb urine dipstick: NEGATIVE
Ketones, ur: 20 mg/dL — AB
Leukocytes,Ua: NEGATIVE
Nitrite: NEGATIVE
Protein, ur: NEGATIVE mg/dL
Specific Gravity, Urine: 1.012 (ref 1.005–1.030)
pH: 7 (ref 5.0–8.0)

## 2018-09-06 LAB — RAPID URINE DRUG SCREEN, HOSP PERFORMED
Amphetamines: NOT DETECTED
Barbiturates: NOT DETECTED
Benzodiazepines: NOT DETECTED
Cocaine: NOT DETECTED
Opiates: NOT DETECTED
Tetrahydrocannabinol: NOT DETECTED

## 2018-09-06 LAB — I-STAT BETA HCG BLOOD, ED (MC, WL, AP ONLY): I-stat hCG, quantitative: <5 m[IU]/mL (ref <5)

## 2018-09-06 MED ORDER — SODIUM CHLORIDE 0.9 % IV BOLUS
1000.0000 mL | Freq: Once | INTRAVENOUS | Status: AC
  Administered 2018-09-06: 1000 mL via INTRAVENOUS

## 2018-09-06 MED ORDER — ONDANSETRON HCL 4 MG/2ML IJ SOLN
4.0000 mg | Freq: Once | INTRAMUSCULAR | Status: AC
  Administered 2018-09-06: 4 mg via INTRAVENOUS
  Filled 2018-09-06: qty 2

## 2018-09-06 MED ORDER — SODIUM CHLORIDE 0.9 % IV BOLUS: 500.0000 mL | Freq: Once | INTRAVENOUS | Status: DC

## 2018-09-06 MED ORDER — POTASSIUM CHLORIDE CRYS ER 20 MEQ PO TBCR
40.0000 meq | EXTENDED_RELEASE_TABLET | Freq: Once | ORAL | Status: AC
  Administered 2018-09-06: 40 meq via ORAL
  Filled 2018-09-06: qty 2

## 2018-09-06 MED ORDER — LEVETIRACETAM IN NACL 1000 MG/100ML IV SOLN
1000.0000 mg | Freq: Once | INTRAVENOUS | Status: AC
  Administered 2018-09-06: 1000 mg via INTRAVENOUS
  Filled 2018-09-06: qty 100

## 2018-09-06 NOTE — ED Notes (Signed)
Pt had another episode of seizure activity. This RN walked by patient's room and witnessed seizure-like activity. Provider notified. Airway secured. It appears patient vomited on herself. Provider at bedside and primary RN made aware.

## 2018-09-06 NOTE — ED Provider Notes (Signed)
[facility]-EMERGENCY DEPT Provider Note   CSN: [id number] Arrival date & time: 09/06/18  1814    History   Chief Complaint Chief Complaint  Patient presents with   Seizures    HPI Alyssa Little is a 38 y.o. female.     HPI   38 year old female history of EtOH abuse, SAH, seizure disorder, TBI, who presents the emergency department today for evaluation of seizure. Per EMS, Patient was at work when she fell and was observed to be having a tonic-clonic seizure. They state she hit her head when she fell.   Level 5 caveat as pt does not have clear memory of the incident. She states that all she remembers is that she became dizzy, sat down, and then she does not remember what happened.   She reports a mild headache. No vomiting, vision changes, numbness/weakness. No neck/back pain.  Takes keppra bid. States has been compliant. Has not missed any doses recently.   States she has otherwise been in her normal state of health. No recent illnesses. Denies fevers. Her only complaint is that she has been having increased panic attacks recently. Denies SI/HI.   States she has 2-3 glasses of wine per night, denies h/o etoh withdrawal seizures that she is aware of. Last drink was last night.  Denies drug use.   Past Medical History:  Diagnosis Date   Alcohol abuse    SAH (subarachnoid hemorrhage) (HCC)    Seizure disorder (HCC)    TBI (traumatic brain injury) (HCC)     Patient Active Problem List   Diagnosis Date Noted   Encephalopathy acute 06/24/2018   Lactic acidemia 06/24/2018   Open scalp wound 04/22/2018   Transaminitis    TBI (traumatic brain injury) (HCC) 03/05/2018   ETOH abuse    Tachypnea    Traumatic brain injury with loss of consciousness (HCC)    Sinus tachycardia    Seizures (HCC)    Acute blood loss anemia    Malnutrition of moderate degree 02/26/2018   SAH (subarachnoid hemorrhage) (HCC) 02/24/2018    Past  Surgical History:  Procedure Laterality Date   NO PAST SURGERIES       OB History   No obstetric history on file.      Home Medications    Prior to Admission medications   Medication Sig Start Date End Date Taking? Authorizing Provider  levETIRAcetam (KEPPRA) 750 MG tablet Take 2 tablets (1,500 mg total) by mouth 2 (two) times daily. 03/11/18  Yes Angiulli, Mcarthur Rossetti, PA-C  Multiple Vitamin (MULTIVITAMIN WITH MINERALS) TABS tablet Take 1 tablet by mouth daily. 03/11/18  Yes Angiulli, Mcarthur Rossetti, PA-C  phenytoin (DILANTIN) 125 MG/5ML suspension Take 6 mLs (150 mg total) by mouth 3 (three) times daily. Patient not taking: Reported on 09/06/2018 03/11/18   Angiulli, Mcarthur Rossetti, PA-C    Family History No family history on file.  Social History Social History   Tobacco Use   Smoking status: Current Every Day Smoker    Packs/day: 0.25    Types: Cigarettes   Smokeless tobacco: Never Used  Substance Use Topics   Alcohol use: Yes    Alcohol/week: 1.0 standard drinks    Types: 1 Glasses of wine per week    Comment: 1 drink per day   Drug use: Never     Allergies   Shellfish allergy   Review of Systems Review of Systems  Constitutional: Negative for fever.  HENT: Negative for ear pain and sore throat.  Eyes: Negative for visual disturbance.  Respiratory: Negative for cough and shortness of breath.   Cardiovascular: Negative for chest pain.  Gastrointestinal: Negative for abdominal pain, constipation, diarrhea, nausea and vomiting.  Genitourinary: Negative for dysuria and hematuria.  Musculoskeletal: Negative for back pain and neck pain.  Skin: Negative for rash.  Neurological: Positive for dizziness (resolved). Negative for light-headedness and numbness.       Seizure, head trauma, mild headache  All other systems reviewed and are negative.   Physical Exam Updated Vital Signs BP (!) 150/100 (BP Location: Left Arm)    Pulse (!) 107    Temp 99.1 F (37.3 C) (Oral)     Resp 16    LMP  (LMP Unknown)    SpO2 100%   Physical Exam Vitals signs and nursing note reviewed.  Constitutional:      General: She is not in acute distress.    Appearance: She is well-developed.  HENT:     Head: Normocephalic and atraumatic.     Nose: Nose normal.  Eyes:     Extraocular Movements: Extraocular movements intact.     Conjunctiva/sclera: Conjunctivae normal.     Pupils: Pupils are equal, round, and reactive to light.     Comments: No nystagmus  Neck:     Musculoskeletal: Normal range of motion and neck supple.     Trachea: No tracheal deviation.  Cardiovascular:     Rate and Rhythm: Normal rate and regular rhythm.     Heart sounds: Normal heart sounds. No murmur.  Pulmonary:     Effort: Pulmonary effort is normal. No respiratory distress.     Breath sounds: Normal breath sounds. No wheezing.  Chest:     Chest wall: No tenderness.  Abdominal:     General: Bowel sounds are normal. There is no distension.     Palpations: Abdomen is soft.     Tenderness: There is no abdominal tenderness. There is no guarding.  Musculoskeletal: Normal range of motion.     Comments: No TTP to the cervical, thoracic, or lumbar spine.   Skin:    General: Skin is warm and dry.     Capillary Refill: Capillary refill takes less than 2 seconds.  Neurological:     Mental Status: She is alert and oriented to person, place, and time.     Comments: Mental Status:  Alert, thought content appropriate, able to give a coherent history. Speech fluent without evidence of aphasia. Able to follow 2 step commands without difficulty.  Cranial Nerves:  II: pupils equal, round, reactive to light III,IV, VI: ptosis not present, extra-ocular motions intact bilaterally  V,VII: smile symmetric, facial light touch sensation equal VIII: hearing grossly normal to voice  X: uvula elevates symmetrically  XI: bilateral shoulder shrug symmetric and strong XII: midline tongue extension without  fassiculations Motor:  Normal tone. 5/5 strength of BUE and BLE major muscle groups including strong and equal grip strength and dorsiflexion/plantar flexion Sensory: light touch normal in all extremities. CV: 2+ radial and DP pulses      ED Treatments / Results  Labs (all labs ordered are listed, but only abnormal results are displayed) Labs Reviewed  CBC WITH DIFFERENTIAL/PLATELET - Abnormal; Notable for the following components:      Result Value   RBC 2.99 (*)    Hemoglobin 11.3 (*)    HCT 33.2 (*)    MCV 111.0 (*)    MCH 37.8 (*)    All other components within normal limits  COMPREHENSIVE METABOLIC PANEL - Abnormal; Notable for the following components:   Potassium 3.1 (*)    Chloride 97 (*)    Glucose, Bld 109 (*)    BUN 5 (*)    Calcium 8.8 (*)    AST 164 (*)    ALT 71 (*)    All other components within normal limits  URINALYSIS, ROUTINE W REFLEX MICROSCOPIC - Abnormal; Notable for the following components:   Ketones, ur 20 (*)    All other components within normal limits  RAPID URINE DRUG SCREEN, HOSP PERFORMED  CBG MONITORING, ED  I-STAT BETA HCG BLOOD, ED ([facility], [facility], [facility] ONLY)    EKG EKG Interpretation  Date/Time:  Saturday Sep 06 2018 18:29:59 EDT Ventricular Rate:  121 PR Interval:    QRS Duration: 86 QT Interval:  351 QTC Calculation: 498 R Axis:   39 Text Interpretation:  Sinus tachycardia Abnormal R-wave progression, early transition Borderline T abnormalities, inferior leads Borderline prolonged QT interval Since last tracing rate slower and QT has lengthened Confirmed by Mancel Bale [phone]) on 09/06/2018 9:40:39 PM   Radiology Ct Head Wo Contrast  Result Date: 09/06/2018 CLINICAL DATA:  Seizure activity with headaches EXAM: CT HEAD WITHOUT CONTRAST TECHNIQUE: Contiguous axial images were obtained from the base of the skull through the vertex without intravenous contrast. COMPARISON:  06/24/2018 FINDINGS: Brain: No evidence of acute infarction,  hemorrhage, hydrocephalus, extra-axial collection or mass lesion/mass effect. Vascular: No hyperdense vessel or unexpected calcification. Skull: Normal. Negative for fracture or focal lesion. Sinuses/Orbits: No acute finding. Other: None. IMPRESSION: Normal head CT Electronically Signed   By: Alcide Clever M.D.   On: 09/06/2018 20:50    Procedures Procedures (including critical care time)  Medications Ordered in ED Medications  sodium chloride 0.9 % bolus 1,000 mL (0 mLs Intravenous Stopped 09/06/18 2119)  potassium chloride SA (K-DUR) CR tablet 40 mEq (40 mEq Oral Given 09/06/18 2208)  levETIRAcetam (KEPPRA) IVPB 1000 mg/100 mL premix (0 mg Intravenous Stopped 09/06/18 2238)  sodium chloride 0.9 % bolus 1,000 mL (1,000 mLs Intravenous New Bag/Given 09/06/18 2215)  ondansetron (ZOFRAN) injection 4 mg (4 mg Intravenous Given 09/06/18 2208)     Initial Impression / Assessment and Plan / ED Course  I have reviewed the triage vital signs and the nursing notes.  Pertinent labs & imaging results that were available during my care of the patient were reviewed by me and considered in my medical decision making (see chart for details).     Final Clinical Impressions(s) / ED Diagnoses   Final diagnoses:  Seizure-like activity (HCC)  Hypokalemia  Elevated liver enzymes   Patient presenting for evaluation after she had a witnessed seizure while at work.  She did hit her head on the ground.  She has a history of traumatic subarachnoid hemorrhage with subsequent seizure disorder secondary to this.  She is currently treated with Keppra twice daily.  She states she has been compliant with this and has not missed any doses recently.  She has been in her normal state of health prior to the onset of her seizure today.    On initial eval, pt is alert and oriented and is no longer postictal. Patient with no evidence of focal neuro deficits on physical exam and is at mental baseline.  Labs and imaging have been  reviewed.   CBC is without leukocytosis, mild anemia that appears to be at baseline. CMP with mild hypokalemia which was supplemented in the ED, kidney function is within  normal limits.  AST/ALT are elevated, this does appear to be chronic for patient but somewhat worse.  Will give GI referral for follow-up. Will have her f/u with pcp about hypokalemia.  hCG negative CBG is normal UDS negative  EKG Sinus tachycardia Abnormal R-wave progression, early transition Borderline T abnormalities, inferior leads Borderline prolonged QT interval, Since last tracing rate slower and QT has lengthened   CT head is without evidence of intracranial bleeding or skull fracture.  Patient given IV fluids and on reassessment her vital signs have improved and she is no longer tachycardic.    9:50 PM Nursing noted that pt was seizing when walking by the room. Seizure lasted about 15 seconds after he entered the room. Pt vomited on self. On assessment pt is post ictal. Not responding to questions. Will load her with keppra and give antiemetics and additional IVF bolus.   Pt care transitioned to Sharilyn Sites, PA-C at shift change with plan to reassess pt. If pt has no additional seizure activity, returns to baseline, and has a low CIWA score, then she may be appropriate for discharge   ED Discharge Orders    None       Rayne Du 09/06/18 2241    Mancel Bale, MD 09/07/18 1615

## 2018-09-06 NOTE — Discharge Instructions (Addendum)
You will need to have your potassium rechecked by your primary doctor. If you do not have a primary doctor you were given information for one on your discharge paperwork and you will need to contact them to make a follow up appointment.   You also need to have your liver enzymes rechecked by your regular doctor or by a gastroenterologist. A referral is in your discharge paperwork for a gastroenterologist.  You will need to contact your neurologist in regards to your breakthrough seizure today.   You will need to return to the emergency room for any new or worsening symptoms.

## 2018-09-06 NOTE — ED Provider Notes (Addendum)
Assumed care from PA Couture at shift change.  See prior notes for full H&P.  Briefly, 38 y.o. F with hx of TBI here after witnessed seizure at work.  Patient was doing well here, had another seizure here in the ED.  Labs have been reassuring thus far.  Patient does admit to drinking 2-3 glasses of wine per night, last drink last evening.  Plan:  IV keppra load, CIWA score, monitor. If returning to baseline, can likely discharge home.  Results for orders placed or performed during the hospital encounter of 09/06/18  CBC with Differential  Result Value Ref Range   WBC 7.2 4.0 - 10.5 K/uL   RBC 2.99 (L) 3.87 - 5.11 MIL/uL   Hemoglobin 11.3 (L) 12.0 - 15.0 g/dL   HCT 88.8 (L) 91.6 - 94.5 %   MCV 111.0 (H) 80.0 - 100.0 fL   MCH 37.8 (H) 26.0 - 34.0 pg   MCHC 34.0 30.0 - 36.0 g/dL   RDW 03.8 88.2 - 80.0 %   Platelets 193 150 - 400 K/uL   nRBC 0.0 0.0 - 0.2 %   Neutrophils Relative % 76 %   Neutro Abs 5.4 1.7 - 7.7 K/uL   Lymphocytes Relative 14 %   Lymphs Abs 1.0 0.7 - 4.0 K/uL   Monocytes Relative 9 %   Monocytes Absolute 0.7 0.1 - 1.0 K/uL   Eosinophils Relative 0 %   Eosinophils Absolute 0.0 0.0 - 0.5 K/uL   Basophils Relative 1 %   Basophils Absolute 0.1 0.0 - 0.1 K/uL   Immature Granulocytes 0 %   Abs Immature Granulocytes 0.03 0.00 - 0.07 K/uL   Polychromasia PRESENT    Target Cells PRESENT   Comprehensive metabolic panel  Result Value Ref Range   Sodium 136 135 - 145 mmol/L   Potassium 3.1 (L) 3.5 - 5.1 mmol/L   Chloride 97 (L) 98 - 111 mmol/L   CO2 25 22 - 32 mmol/L   Glucose, Bld 109 (H) 70 - 99 mg/dL   BUN 5 (L) 6 - 20 mg/dL   Creatinine, Ser 3.49 0.44 - 1.00 mg/dL   Calcium 8.8 (L) 8.9 - 10.3 mg/dL   Total Protein 7.0 6.5 - 8.1 g/dL   Albumin 3.9 3.5 - 5.0 g/dL   AST 179 (H) 15 - 41 U/L   ALT 71 (H) 0 - 44 U/L   Alkaline Phosphatase 100 38 - 126 U/L   Total Bilirubin 0.5 0.3 - 1.2 mg/dL   GFR calc non Af Amer >60 >60 mL/min   GFR calc Af Amer >60 >60 mL/min    Anion gap 14 5 - 15  Urinalysis, Routine w reflex microscopic  Result Value Ref Range   Color, Urine YELLOW YELLOW   APPearance CLEAR CLEAR   Specific Gravity, Urine 1.012 1.005 - 1.030   pH 7.0 5.0 - 8.0   Glucose, UA NEGATIVE NEGATIVE mg/dL   Hgb urine dipstick NEGATIVE NEGATIVE   Bilirubin Urine NEGATIVE NEGATIVE   Ketones, ur 20 (A) NEGATIVE mg/dL   Protein, ur NEGATIVE NEGATIVE mg/dL   Nitrite NEGATIVE NEGATIVE   Leukocytes,Ua NEGATIVE NEGATIVE  Rapid urine drug screen (hospital performed)  Result Value Ref Range   Opiates NONE DETECTED NONE DETECTED   Cocaine NONE DETECTED NONE DETECTED   Benzodiazepines NONE DETECTED NONE DETECTED   Amphetamines NONE DETECTED NONE DETECTED   Tetrahydrocannabinol NONE DETECTED NONE DETECTED   Barbiturates NONE DETECTED NONE DETECTED  I-Stat Beta hCG blood, ED ([facility], [facility], [facility]  only)  Result Value Ref Range   I-stat hCG, quantitative <5.0 <5 mIU/mL   Comment 3           Ct Head Wo Contrast  Result Date: 09/06/2018 CLINICAL DATA:  Seizure activity with headaches EXAM: CT HEAD WITHOUT CONTRAST TECHNIQUE: Contiguous axial images were obtained from the base of the skull through the vertex without intravenous contrast. COMPARISON:  06/24/2018 FINDINGS: Brain: No evidence of acute infarction, hemorrhage, hydrocephalus, extra-axial collection or mass lesion/mass effect. Vascular: No hyperdense vessel or unexpected calcification. Skull: Normal. Negative for fracture or focal lesion. Sinuses/Orbits: No acute finding. Other: None. IMPRESSION: Normal head CT Electronically Signed   By: Alcide CleverMark  Lukens M.D.   On: 09/06/2018 20:50     2:22 AM Patient has been monitored here for a few hours.  She has not had any further seizures and is feeling well at this time.  No clinical signs/symptoms of withdrawal at this time.  VSS.  Feel she is stable for discharge home.  Have sent refill of her keppra to pharmacy.  She will follow-up with her PCP.  Driving precautions in  place for now.  Return here for any new/acute changes.   Garlon HatchetSanders, Amyr Sluder M, PA-C 09/07/18 0243    Garlon HatchetSanders, Mica Ramdass M, PA-C 09/07/18 [id number]    Shon BatonHorton, Courtney F, MD 09/07/18 [phone]

## 2018-09-06 NOTE — ED Notes (Signed)
Patient transported to CT 

## 2018-09-06 NOTE — ED Notes (Signed)
Pt aware urine is needed. Pt unable at this time.

## 2018-09-06 NOTE — ED Notes (Signed)
Bed: BE01 Expected date:  Expected time:  Means of arrival:  Comments: Seizure w/ hx of the same

## 2018-09-06 NOTE — ED Triage Notes (Signed)
While at work today, she was observed to have a tonic-clonic seizure. She arrives on our unit awake, slightly drowsy and in no distress. She is able to tell [name] her name and date of birth. She also tells [name] she has a hx of seizures and that she is comfortable.

## 2018-09-07 MED ORDER — LEVETIRACETAM 750 MG PO TABS: 1500.0000 mg | ORAL_TABLET | Freq: Two times a day (BID) | ORAL | 0 refills | Status: DC

## 2018-09-07 NOTE — ED Notes (Signed)
Pt turned of EKG monitor and IV pump states it was making to much noise so I turned them off. Asked if she used pt call bell that was at bedside pt stated no.  Alyssa Little was asked that in the future if this problem arises please call staff with the call light to assist her and she verbalized understanding

## 2018-09-09 ENCOUNTER — Telehealth: Payer: Self-pay | Admitting: Physical Medicine & Rehabilitation

## 2018-09-09 NOTE — Telephone Encounter (Signed)
PHONED patient and advised Dr Riley Kill suggested her contacting Neurology or PCP - he cannot see in office.

## 2018-09-09 NOTE — Telephone Encounter (Signed)
She will need to follow up with her pcp or neurology. Pt no-showed her last appointment with me. Since then, she has been to the hospital twice for seizures (most recently 5/23). Also she continues to drink ETOH despite her tbi and seizures. I will not be the one clearing her for work.

## 2018-09-09 NOTE — Telephone Encounter (Signed)
Looks like patient no showed back in March and we were told not reschedule this patient unless we spoke with Dr. Riley Kill.  She said she needs a follow up appointment in order to go back to work.  Her job will not let her until she gets an okay from you.  Please advise.

## 2018-09-24 ENCOUNTER — Ambulatory Visit: Payer: Managed Care, Other (non HMO) | Admitting: Medical

## 2018-09-26 ENCOUNTER — Encounter: Payer: Self-pay | Admitting: Medical

## 2018-09-26 ENCOUNTER — Other Ambulatory Visit: Payer: Self-pay

## 2018-09-26 ENCOUNTER — Ambulatory Visit: Payer: Managed Care, Other (non HMO) | Admitting: Medical

## 2018-09-26 ENCOUNTER — Encounter: Payer: Self-pay | Admitting: *Deleted

## 2018-09-26 ENCOUNTER — Other Ambulatory Visit: Payer: Managed Care, Other (non HMO)

## 2018-09-26 ENCOUNTER — Encounter (HOSPITAL_COMMUNITY): Payer: Self-pay | Admitting: Emergency Medicine

## 2018-09-26 VITALS — HR 130 | Temp 98.2°F | Ht 62.0 in | Wt 87.2 lb

## 2018-09-26 DIAGNOSIS — Z20822 Contact with and (suspected) exposure to covid-19: Secondary | ICD-10-CM

## 2018-09-26 DIAGNOSIS — R0602 Shortness of breath: Secondary | ICD-10-CM

## 2018-09-26 DIAGNOSIS — J029 Acute pharyngitis, unspecified: Secondary | ICD-10-CM

## 2018-09-26 DIAGNOSIS — R05 Cough: Secondary | ICD-10-CM

## 2018-09-26 DIAGNOSIS — R059 Cough, unspecified: Secondary | ICD-10-CM

## 2018-09-26 NOTE — Progress Notes (Signed)
Subjective: Chief Complaint  Patient presents with  . New Patient (Initial Visit)    seizure consult    She was initially scheduled for new patient consult for seizures.   She was called earlier for screening and was reportedly negative. However, when CMA triaged her today, she noted cough, SOB, sore throat and "summer time cold symptoms."  CMA stepped out of the room and informed me of this.   The decision was made to not see her at this time.    CMA already had on masked but donned PPE, and informed patient to report to [place] for covid testing.  Objective Not examined   Assessment: Encounter Diagnoses  Name Primary?  . Cough Yes  . Sore throat   . SOB (shortness of breath)     Plan Report to [facility] for  Covid test.

## 2018-09-26 NOTE — Telephone Encounter (Signed)
This encounter was created in error - please disregard.

## 2018-09-28 LAB — NOVEL CORONAVIRUS, NAA: SARS-CoV-2, NAA: NOT DETECTED

## 2018-09-29 ENCOUNTER — Other Ambulatory Visit: Payer: Self-pay

## 2018-09-29 ENCOUNTER — Telehealth: Payer: Self-pay | Admitting: Medical

## 2018-09-29 ENCOUNTER — Ambulatory Visit (INDEPENDENT_AMBULATORY_CARE_PROVIDER_SITE_OTHER): Payer: Managed Care, Other (non HMO) | Admitting: Medical

## 2018-09-29 VITALS — Ht 62.0 in | Wt 87.0 lb

## 2018-09-29 DIAGNOSIS — E46 Unspecified protein-calorie malnutrition: Secondary | ICD-10-CM | POA: Insufficient documentation

## 2018-09-29 DIAGNOSIS — F1090 Alcohol use, unspecified, uncomplicated: Secondary | ICD-10-CM | POA: Insufficient documentation

## 2018-09-29 DIAGNOSIS — R636 Underweight: Secondary | ICD-10-CM | POA: Insufficient documentation

## 2018-09-29 DIAGNOSIS — N911 Secondary amenorrhea: Secondary | ICD-10-CM | POA: Insufficient documentation

## 2018-09-29 DIAGNOSIS — S069X9D Unspecified intracranial injury with loss of consciousness of unspecified duration, subsequent encounter: Secondary | ICD-10-CM

## 2018-09-29 DIAGNOSIS — R059 Cough, unspecified: Secondary | ICD-10-CM | POA: Insufficient documentation

## 2018-09-29 DIAGNOSIS — R4586 Emotional lability: Secondary | ICD-10-CM | POA: Insufficient documentation

## 2018-09-29 DIAGNOSIS — Z789 Other specified health status: Secondary | ICD-10-CM | POA: Insufficient documentation

## 2018-09-29 DIAGNOSIS — E569 Vitamin deficiency, unspecified: Secondary | ICD-10-CM

## 2018-09-29 DIAGNOSIS — R569 Unspecified convulsions: Secondary | ICD-10-CM | POA: Insufficient documentation

## 2018-09-29 DIAGNOSIS — L719 Rosacea, unspecified: Secondary | ICD-10-CM | POA: Insufficient documentation

## 2018-09-29 DIAGNOSIS — Z7289 Other problems related to lifestyle: Secondary | ICD-10-CM | POA: Insufficient documentation

## 2018-09-29 DIAGNOSIS — R05 Cough: Secondary | ICD-10-CM

## 2018-09-29 DIAGNOSIS — D539 Nutritional anemia, unspecified: Secondary | ICD-10-CM | POA: Insufficient documentation

## 2018-09-29 DIAGNOSIS — R748 Abnormal levels of other serum enzymes: Secondary | ICD-10-CM

## 2018-09-29 DIAGNOSIS — R197 Diarrhea, unspecified: Secondary | ICD-10-CM | POA: Insufficient documentation

## 2018-09-29 DIAGNOSIS — E440 Moderate protein-calorie malnutrition: Secondary | ICD-10-CM | POA: Insufficient documentation

## 2018-09-29 DIAGNOSIS — D649 Anemia, unspecified: Secondary | ICD-10-CM | POA: Insufficient documentation

## 2018-09-29 HISTORY — DX: Underweight: R63.6

## 2018-09-29 HISTORY — DX: Abnormal levels of other serum enzymes: R74.8

## 2018-09-29 HISTORY — DX: Vitamin deficiency, unspecified: E56.9
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
## 2018-09-29 NOTE — Telephone Encounter (Signed)
Call for follow per Monday's visit

## 2018-09-30 NOTE — Telephone Encounter (Signed)
Ok, try back later

## 2018-09-30 NOTE — Telephone Encounter (Signed)
Tried calling patient again this afternoon, there was no answer and VM not set up.

## 2018-09-30 NOTE — Telephone Encounter (Signed)
Tried calling patient again. Unable to reach her, will try back later

## 2018-09-30 NOTE — Telephone Encounter (Signed)
Please call her back.   I tried to call her this morning as a follow up from yesterday's virtual visit.  I would like her to come in to the office today or tomorrow for labs, vital signs, for brief exam and to meet and greet.  That is why I was calling her back.   I will need to refer her to neurology for seizure consult/clearance.  I want to have a chance to talk with her in person when she comes in.

## 2018-10-01 NOTE — Telephone Encounter (Signed)
Try again today

## 2018-10-01 NOTE — Telephone Encounter (Signed)
Patient answered the phone but didn't say anything, I called back and there was no answer.

## 2018-10-02 NOTE — Telephone Encounter (Signed)
Tried to call patient again and no answer and no voicemail. 

## 2018-10-02 NOTE — Telephone Encounter (Signed)
See me

## 2018-10-03 NOTE — Telephone Encounter (Signed)
Tried to call patient again and no answer and no voicemail set up.

## 2018-10-06 ENCOUNTER — Encounter: Payer: Self-pay | Admitting: Medical

## 2018-10-06 NOTE — Telephone Encounter (Signed)
done

## 2018-10-06 NOTE — Telephone Encounter (Signed)
Please mail her letter I printed today

## 2018-10-15 ENCOUNTER — Telehealth: Payer: Self-pay | Admitting: Medical

## 2018-10-15 NOTE — Telephone Encounter (Signed)
Pt called and wanted to come in for a visit. She was tested 2 weeks ago for having 9 of the covid symptoms but her results came back negative. She is still experiencing mild symptoms of loss of smell and taste, diarrhea, and nausea. She needs something in writing from you stating that she can return to work or she will loose her job. She can be reached at [phone]

## 2018-10-15 NOTE — Telephone Encounter (Signed)
This message is to Alyssa Little, you were not here when I did her virtual consult.   (please see prior letter or tele visit.)  She did not return several of our phone calls after that visit.  We were trying to get her to come in for labs, vitals, meet and greet with me, brief exam for further eval, and referral to neurology.   So lets get her IN for follow up and Cyndi, lets go ahead and refer to neurology.  I will not be able to clear her as there is other evaluation to do before we can clear her.  I don't know if she is safe to return to work or not.

## 2018-10-16 ENCOUNTER — Other Ambulatory Visit: Payer: Self-pay

## 2018-10-16 DIAGNOSIS — R569 Unspecified convulsions: Secondary | ICD-10-CM

## 2018-10-16 DIAGNOSIS — S069X9D Unspecified intracranial injury with loss of consciousness of unspecified duration, subsequent encounter: Secondary | ICD-10-CM

## 2018-10-16 NOTE — Telephone Encounter (Signed)
No answer and no voicemail to leave message.   Will try again.

## 2018-10-16 NOTE — Telephone Encounter (Signed)
No answer and no voicemail to leave message

## 2018-10-21 NOTE — Telephone Encounter (Signed)
Patient has never called me back and I am closing this encounter.

## 2018-11-11 ENCOUNTER — Ambulatory Visit: Payer: Managed Care, Other (non HMO) | Admitting: Neurology

## 2018-11-11 ENCOUNTER — Telehealth: Payer: Self-pay

## 2018-11-11 ENCOUNTER — Encounter

## 2018-11-11 NOTE — Telephone Encounter (Signed)
Patient no show for new pt appt. 

## 2018-11-12 ENCOUNTER — Encounter: Payer: Self-pay | Admitting: Neurology

## 2018-12-11 ENCOUNTER — Telehealth: Payer: Self-pay | Admitting: Medical

## 2018-12-11 NOTE — Telephone Encounter (Signed)
Pt dropped off forms to be completed. 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 Please call pt at [phone] when ready. Sending to [facility] per office protocol.

## 2018-12-11 NOTE — Telephone Encounter (Signed)
Will have provider to complete the form.

## 2018-12-12 ENCOUNTER — Telehealth: Payer: Self-pay | Admitting: Medical

## 2018-12-12 NOTE — Telephone Encounter (Signed)
I received a work accommodation form from [employer]   see prior [employer] and office notes from June.  She never called back on our multiple phone calls to her  I cannot give any type of clearance for work as she needed to see neurology.  According to the chart record she no showed that appointment.  There was also concern about underweight, nutrition, alcohol abuse.  I will be glad to see her in person to discuss further and to try to address some of these issues, but specifically as far as work clearance that will need to be done by neurology as there was a question whether she truly has seizures or not.  I cannot answer that question

## 2018-12-15 NOTE — Telephone Encounter (Signed)
Unable to lmom to update patient of provider message.

## 2018-12-15 NOTE — Telephone Encounter (Signed)
Alyssa Little has reviewed these forms and noted on it

## 2018-12-19 NOTE — Telephone Encounter (Signed)
forward

## 2018-12-19 NOTE — Telephone Encounter (Signed)
Letter has been mailed to patient

## 2018-12-19 NOTE — Telephone Encounter (Signed)
Send letter if no voice mail

## 2018-12-25 ENCOUNTER — Telehealth: Payer: Self-pay | Admitting: Family Medicine

## 2018-12-25 NOTE — Telephone Encounter (Signed)
Aunt Caitlyn Fia called to get status on FED EX forms.  We do not have a HIPAA for aunt. Aunt lives in [place], she is going to visit with patient in [place] today.  When aunt gets there she will have patient call me.  Several calls have gone out to patient with no answer, letters have been sent.   Need to update pt phone and address.  Pt needs appt here with pcp to discuss why we can't complete forms.  Pt will need to see Neuro to complete forms.  Pt no showed her 1st Neuro appt.

## 2018-12-29 ENCOUNTER — Telehealth: Payer: Self-pay | Admitting: Family Medicine

## 2018-12-29 ENCOUNTER — Telehealth: Payer: Self-pay | Admitting: Neurology

## 2018-12-29 ENCOUNTER — Telehealth: Payer: Self-pay | Admitting: *Deleted
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
## 2018-12-29 NOTE — Telephone Encounter (Signed)
Alyssa Little, Alyssa Little N 30 minutes ago (12:03 PM)     Tried calling pt twice to offer an appointment with Dr. Leonie Man on 9/23 at 11:00. Phone just rang, and pt's VM is not set up.

## 2018-12-29 NOTE — Telephone Encounter (Signed)
PT called I advised she needs to have appt with [facility] and they will need to complete the FED EX FORMS. Also sending DPR to pt for her aunt.

## 2018-12-29 NOTE — Telephone Encounter (Signed)
Tried calling pt twice to offer an appointment with Dr. Leonie Man on 9/23 at 11:00. Phone just rang, and pt's VM is not set up.

## 2019-02-03 ENCOUNTER — Encounter

## 2019-02-03 ENCOUNTER — Ambulatory Visit: Payer: Managed Care, Other (non HMO) | Admitting: Neurology

## 2019-02-04 ENCOUNTER — Telehealth: Payer: Self-pay

## 2019-02-04 NOTE — Telephone Encounter (Signed)
Patient no show for appt on 02/03/2019.

## 2019-02-04 NOTE — Telephone Encounter (Signed)
As per [place] office policy she may be dismissed

## 2019-02-04 NOTE — Telephone Encounter (Signed)
Patient had no show new pt appt on 7/28 and 10/20. Sent to Dr. Leonie Man to see if he wants to dismiss.

## 2019-03-26 ENCOUNTER — Encounter (HOSPITAL_COMMUNITY): Payer: Self-pay

## 2019-03-26 ENCOUNTER — Observation Stay (HOSPITAL_COMMUNITY): Payer: Self-pay

## 2019-03-26 ENCOUNTER — Emergency Department (HOSPITAL_COMMUNITY): Payer: Self-pay

## 2019-03-26 ENCOUNTER — Inpatient Hospital Stay (HOSPITAL_COMMUNITY)
Admission: EM | Admit: 2019-03-26 | Discharge: 2019-03-31 | DRG: 071 | Disposition: A | Discharge status: Home Health | Payer: Self-pay | Attending: Family Medicine | Admitting: Family Medicine

## 2019-03-26 DIAGNOSIS — R4182 Altered mental status, unspecified: Secondary | ICD-10-CM | POA: Diagnosis present

## 2019-03-26 DIAGNOSIS — F1090 Alcohol use, unspecified, uncomplicated: Secondary | ICD-10-CM | POA: Diagnosis present

## 2019-03-26 DIAGNOSIS — F1020 Alcohol dependence, uncomplicated: Secondary | ICD-10-CM | POA: Diagnosis present

## 2019-03-26 DIAGNOSIS — R945 Abnormal results of liver function studies: Secondary | ICD-10-CM | POA: Diagnosis present

## 2019-03-26 DIAGNOSIS — F17210 Nicotine dependence, cigarettes, uncomplicated: Secondary | ICD-10-CM | POA: Diagnosis present

## 2019-03-26 DIAGNOSIS — R569 Unspecified convulsions: Secondary | ICD-10-CM

## 2019-03-26 DIAGNOSIS — B955 Unspecified streptococcus as the cause of diseases classified elsewhere: Secondary | ICD-10-CM | POA: Diagnosis present

## 2019-03-26 DIAGNOSIS — B9561 Methicillin susceptible Staphylococcus aureus infection as the cause of diseases classified elsewhere: Secondary | ICD-10-CM | POA: Diagnosis present

## 2019-03-26 DIAGNOSIS — E872 Acidosis: Secondary | ICD-10-CM | POA: Diagnosis present

## 2019-03-26 DIAGNOSIS — R7881 Bacteremia: Secondary | ICD-10-CM | POA: Diagnosis present

## 2019-03-26 DIAGNOSIS — F10231 Alcohol dependence with withdrawal delirium: Secondary | ICD-10-CM

## 2019-03-26 DIAGNOSIS — E871 Hypo-osmolality and hyponatremia: Secondary | ICD-10-CM | POA: Diagnosis present

## 2019-03-26 DIAGNOSIS — F1010 Alcohol abuse, uncomplicated: Secondary | ICD-10-CM | POA: Diagnosis present

## 2019-03-26 DIAGNOSIS — G9341 Metabolic encephalopathy: Secondary | ICD-10-CM

## 2019-03-26 DIAGNOSIS — Z20828 Contact with and (suspected) exposure to other viral communicable diseases: Secondary | ICD-10-CM | POA: Diagnosis present

## 2019-03-26 DIAGNOSIS — F10931 Alcohol use, unspecified with withdrawal delirium: Secondary | ICD-10-CM

## 2019-03-26 DIAGNOSIS — E876 Hypokalemia: Secondary | ICD-10-CM | POA: Diagnosis not present

## 2019-03-26 DIAGNOSIS — Z79899 Other long term (current) drug therapy: Secondary | ICD-10-CM

## 2019-03-26 DIAGNOSIS — G40909 Epilepsy, unspecified, not intractable, without status epilepticus: Secondary | ICD-10-CM

## 2019-03-26 DIAGNOSIS — E875 Hyperkalemia: Secondary | ICD-10-CM | POA: Diagnosis present

## 2019-03-26 DIAGNOSIS — K760 Fatty (change of) liver, not elsewhere classified: Secondary | ICD-10-CM | POA: Diagnosis present

## 2019-03-26 DIAGNOSIS — Z87820 Personal history of traumatic brain injury: Secondary | ICD-10-CM

## 2019-03-26 DIAGNOSIS — G9349 Other encephalopathy: Principal | ICD-10-CM | POA: Diagnosis present

## 2019-03-26 LAB — BASIC METABOLIC PANEL
Anion gap: 11 (ref 5–15)
Anion gap: 12 (ref 5–15)
BUN: <5 mg/dL — ABNORMAL LOW (ref 6–20)
BUN: <5 mg/dL — ABNORMAL LOW (ref 6–20)
CO2: 24 mmol/L (ref 22–32)
CO2: 26 mmol/L (ref 22–32)
Calcium: 8.2 mg/dL — ABNORMAL LOW (ref 8.9–10.3)
Calcium: 8.7 mg/dL — ABNORMAL LOW (ref 8.9–10.3)
Chloride: 105 mmol/L (ref 98–111)
Chloride: 101 mmol/L (ref 98–111)
Creatinine, Ser: 0.43 mg/dL — ABNORMAL LOW (ref 0.44–1.00)
Creatinine, Ser: 0.53 mg/dL (ref 0.44–1.00)
GFR calc Af Amer: >60 mL/min (ref >60)
GFR calc Af Amer: >60 mL/min (ref >60)
GFR calc non Af Amer: >60 mL/min (ref >60)
GFR calc non Af Amer: >60 mL/min (ref >60)
Glucose, Bld: 94 mg/dL (ref 70–99)
Glucose, Bld: 89 mg/dL (ref 70–99)
Potassium: 3 mmol/L — ABNORMAL LOW (ref 3.5–5.1)
Potassium: 3 mmol/L — ABNORMAL LOW (ref 3.5–5.1)
Sodium: 140 mmol/L (ref 135–145)
Sodium: 139 mmol/L (ref 135–145)

## 2019-03-26 LAB — URINALYSIS, ROUTINE W REFLEX MICROSCOPIC
Bacteria, UA: NONE SEEN
Bilirubin Urine: NEGATIVE
Glucose, UA: 150 mg/dL — AB
Ketones, ur: 80 mg/dL — AB
Leukocytes,Ua: NEGATIVE
Nitrite: NEGATIVE
Protein, ur: 100 mg/dL — AB
Specific Gravity, Urine: 1.018 (ref 1.005–1.030)
pH: 5 (ref 5.0–8.0)

## 2019-03-26 LAB — CBC WITH DIFFERENTIAL/PLATELET
Abs Immature Granulocytes: 0.08 10*3/uL — ABNORMAL HIGH (ref 0.00–0.07)
Basophils Absolute: 0 10*3/uL (ref 0.0–0.1)
Basophils Relative: 0 %
Eosinophils Absolute: 0 10*3/uL (ref 0.0–0.5)
Eosinophils Relative: 0 %
HCT: 35.2 % — ABNORMAL LOW (ref 36.0–46.0)
Hemoglobin: 12 g/dL (ref 12.0–15.0)
Immature Granulocytes: 1 %
Lymphocytes Relative: 2 %
Lymphs Abs: 0.3 10*3/uL — ABNORMAL LOW (ref 0.7–4.0)
MCH: 36.6 pg — ABNORMAL HIGH (ref 26.0–34.0)
MCHC: 34.1 g/dL (ref 30.0–36.0)
MCV: 107.3 fL — ABNORMAL HIGH (ref 80.0–100.0)
Monocytes Absolute: 1.1 10*3/uL — ABNORMAL HIGH (ref 0.1–1.0)
Monocytes Relative: 7 %
Neutro Abs: 14.3 10*3/uL — ABNORMAL HIGH (ref 1.7–7.7)
Neutrophils Relative %: 90 %
Platelets: 199 10*3/uL (ref 150–400)
RBC: 3.28 MIL/uL — ABNORMAL LOW (ref 3.87–5.11)
RDW: 13.3 % (ref 11.5–15.5)
WBC: 15.7 10*3/uL — ABNORMAL HIGH (ref 4.0–10.5)
nRBC: 0 % (ref 0.0–0.2)

## 2019-03-26 LAB — COMPREHENSIVE METABOLIC PANEL
ALT: 119 U/L — ABNORMAL HIGH (ref 0–44)
ALT: 107 U/L — ABNORMAL HIGH (ref 0–44)
AST: 277 U/L — ABNORMAL HIGH (ref 15–41)
AST: 209 U/L — ABNORMAL HIGH (ref 15–41)
Albumin: 3.9 g/dL (ref 3.5–5.0)
Albumin: 3.7 g/dL (ref 3.5–5.0)
Alkaline Phosphatase: 147 U/L — ABNORMAL HIGH (ref 38–126)
Alkaline Phosphatase: 141 U/L — ABNORMAL HIGH (ref 38–126)
Anion gap: 23 — ABNORMAL HIGH (ref 5–15)
Anion gap: 22 — ABNORMAL HIGH (ref 5–15)
BUN: 5 mg/dL — ABNORMAL LOW (ref 6–20)
BUN: <5 mg/dL — ABNORMAL LOW (ref 6–20)
CO2: 13 mmol/L — ABNORMAL LOW (ref 22–32)
CO2: 15 mmol/L — ABNORMAL LOW (ref 22–32)
Calcium: 8.3 mg/dL — ABNORMAL LOW (ref 8.9–10.3)
Calcium: 8.6 mg/dL — ABNORMAL LOW (ref 8.9–10.3)
Chloride: 98 mmol/L (ref 98–111)
Chloride: 99 mmol/L (ref 98–111)
Creatinine, Ser: 0.73 mg/dL (ref 0.44–1.00)
Creatinine, Ser: 0.67 mg/dL (ref 0.44–1.00)
GFR calc Af Amer: >60 mL/min (ref >60)
GFR calc Af Amer: >60 mL/min (ref >60)
GFR calc non Af Amer: >60 mL/min (ref >60)
GFR calc non Af Amer: >60 mL/min (ref >60)
Glucose, Bld: 127 mg/dL — ABNORMAL HIGH (ref 70–99)
Glucose, Bld: 124 mg/dL — ABNORMAL HIGH (ref 70–99)
Potassium: 5.6 mmol/L — ABNORMAL HIGH (ref 3.5–5.1)
Potassium: 4.3 mmol/L (ref 3.5–5.1)
Sodium: 134 mmol/L — ABNORMAL LOW (ref 135–145)
Sodium: 136 mmol/L (ref 135–145)
Total Bilirubin: 3.1 mg/dL — ABNORMAL HIGH (ref 0.3–1.2)
Total Bilirubin: 2 mg/dL — ABNORMAL HIGH (ref 0.3–1.2)
Total Protein: 7 g/dL (ref 6.5–8.1)
Total Protein: 7.1 g/dL (ref 6.5–8.1)

## 2019-03-26 LAB — SEDIMENTATION RATE: Sed Rate: 8 mm/hr (ref 0–22)

## 2019-03-26 LAB — PROTIME-INR
INR: 1 (ref 0.8–1.2)
Prothrombin Time: 13.3 seconds (ref 11.4–15.2)

## 2019-03-26 LAB — HCG, QUANTITATIVE, PREGNANCY: hCG, Beta Chain, Quant, S: 1 m[IU]/mL (ref <5)

## 2019-03-26 LAB — RPR: RPR Ser Ql: NONREACTIVE

## 2019-03-26 LAB — RAPID URINE DRUG SCREEN, HOSP PERFORMED
Amphetamines: NOT DETECTED
Barbiturates: NOT DETECTED
Benzodiazepines: NOT DETECTED
Cocaine: NOT DETECTED
Opiates: NOT DETECTED
Tetrahydrocannabinol: NOT DETECTED

## 2019-03-26 LAB — PHENYTOIN LEVEL, TOTAL: Phenytoin Lvl: <2.5 ug/mL — ABNORMAL LOW (ref 10.0–20.0)

## 2019-03-26 LAB — CK TOTAL AND CKMB (NOT AT ARMC)
CK, MB: 10.4 ng/mL — ABNORMAL HIGH (ref 0.5–5.0)
Relative Index: 1.4 (ref 0.0–2.5)
Total CK: 738 U/L — ABNORMAL HIGH (ref 38–234)

## 2019-03-26 LAB — SALICYLATE LEVEL: Salicylate Lvl: <7 mg/dL (ref 2.8–30.0)

## 2019-03-26 LAB — ACETAMINOPHEN LEVEL: Acetaminophen (Tylenol), Serum: <10 ug/mL — ABNORMAL LOW (ref 10–30)

## 2019-03-26 LAB — AMMONIA: Ammonia: 50 umol/L — ABNORMAL HIGH (ref 9–35)

## 2019-03-26 LAB — LIPASE, BLOOD: Lipase: 23 U/L (ref 11–51)

## 2019-03-26 LAB — SARS CORONAVIRUS 2 (TAT 6-24 HRS): SARS Coronavirus 2: NEGATIVE

## 2019-03-26 LAB — MAGNESIUM: Magnesium: 2.3 mg/dL (ref 1.7–2.4)

## 2019-03-26 LAB — TSH: TSH: 0.519 u[IU]/mL (ref 0.350–4.500)

## 2019-03-26 LAB — LACTIC ACID, PLASMA: Lactic Acid, Venous: 1.1 mmol/L (ref 0.5–1.9)

## 2019-03-26 MED ORDER — SODIUM CHLORIDE 0.9 % IV SOLN
20.0000 mg/kg | Freq: Once | INTRAVENOUS | Status: AC
  Administered 2019-03-26: 908 mg via INTRAVENOUS
  Filled 2019-03-26: qty 18.16

## 2019-03-26 MED ORDER — ADULT MULTIVITAMIN W/MINERALS CH
1.0000 | ORAL_TABLET | Freq: Every day | ORAL | Status: DC
  Administered 2019-03-28 – 2019-03-31 (×4): 1 via ORAL
  Filled 2019-03-26 (×5): qty 1

## 2019-03-26 MED ORDER — FOLIC ACID 1 MG PO TABS: 1.0000 mg | ORAL_TABLET | Freq: Every day | ORAL | Status: DC

## 2019-03-26 MED ORDER — SODIUM CHLORIDE 0.9 % IV SOLN
150.0000 mg | Freq: Three times a day (TID) | INTRAVENOUS | Status: DC
  Filled 2019-03-26 (×2): qty 3

## 2019-03-26 MED ORDER — SODIUM CHLORIDE 0.9 % IV SOLN
INTRAVENOUS | Status: AC
  Administered 2019-03-26: 06:00:00 via INTRAVENOUS

## 2019-03-26 MED ORDER — VITAMIN B-1 100 MG PO TABS
100.0000 mg | ORAL_TABLET | Freq: Every day | ORAL | Status: DC
  Administered 2019-03-29 – 2019-03-31 (×3): 100 mg via ORAL
  Filled 2019-03-26 (×3): qty 1

## 2019-03-26 MED ORDER — CALCIUM GLUCONATE-NACL 1-0.675 GM/50ML-% IV SOLN
1.0000 g | Freq: Once | INTRAVENOUS | Status: AC
  Administered 2019-03-26: 1000 mg via INTRAVENOUS
  Filled 2019-03-26: qty 50

## 2019-03-26 MED ORDER — SODIUM CHLORIDE 0.9% FLUSH: 3.0000 mL | INTRAVENOUS | Status: DC | PRN

## 2019-03-26 MED ORDER — ADULT MULTIVITAMIN W/MINERALS CH
1.0000 | ORAL_TABLET | Freq: Every day | ORAL | Status: DC
  Filled 2019-03-26 (×2): qty 1

## 2019-03-26 MED ORDER — SODIUM CHLORIDE 0.9 % IV SOLN: 150.0000 mg | Freq: Three times a day (TID) | INTRAVENOUS | Status: DC

## 2019-03-26 MED ORDER — SODIUM BICARBONATE 8.4 % IV SOLN
50.0000 meq | Freq: Once | INTRAVENOUS | Status: AC
  Administered 2019-03-26: 50 meq via INTRAVENOUS
  Filled 2019-03-26: qty 50

## 2019-03-26 MED ORDER — NICOTINE 21 MG/24HR TD PT24: 21.0000 mg | MEDICATED_PATCH | Freq: Every day | TRANSDERMAL | Status: DC | PRN

## 2019-03-26 MED ORDER — PHENYTOIN 125 MG/5ML PO SUSP
150.0000 mg | Freq: Three times a day (TID) | ORAL | Status: DC
  Filled 2019-03-26 (×3): qty 8

## 2019-03-26 MED ORDER — LEVETIRACETAM IN NACL 1500 MG/100ML IV SOLN
1500.0000 mg | Freq: Two times a day (BID) | INTRAVENOUS | Status: DC
  Administered 2019-03-26 – 2019-03-29 (×7): 1500 mg via INTRAVENOUS
  Filled 2019-03-26 (×9): qty 100

## 2019-03-26 MED ORDER — SODIUM CHLORIDE 0.9 % IV SOLN
150.0000 mg | Freq: Three times a day (TID) | INTRAVENOUS | Status: DC
  Administered 2019-03-26 – 2019-03-29 (×9): 150 mg via INTRAVENOUS
  Filled 2019-03-26 (×14): qty 3

## 2019-03-26 MED ORDER — ONDANSETRON HCL 4 MG/2ML IJ SOLN
INTRAMUSCULAR | Status: AC
  Administered 2019-03-26: 4 mg
  Filled 2019-03-26: qty 2

## 2019-03-26 MED ORDER — LORAZEPAM 2 MG/ML IJ SOLN
INTRAMUSCULAR | Status: AC
  Administered 2019-03-26: 2 mg
  Filled 2019-03-26: qty 1

## 2019-03-26 MED ORDER — LORAZEPAM 2 MG/ML IJ SOLN
0.0000 mg | Freq: Two times a day (BID) | INTRAMUSCULAR | Status: DC
  Administered 2019-03-29: 1 mg via INTRAVENOUS
  Filled 2019-03-26: qty 1

## 2019-03-26 MED ORDER — SODIUM POLYSTYRENE SULFONATE 15 GM/60ML PO SUSP
15.0000 g | Freq: Once | ORAL | Status: AC
  Administered 2019-03-26: 15 g via ORAL
  Filled 2019-03-26: qty 60

## 2019-03-26 MED ORDER — THIAMINE HCL 100 MG/ML IJ SOLN
100.0000 mg | Freq: Every day | INTRAMUSCULAR | Status: DC
  Administered 2019-03-26 – 2019-03-28 (×3): 100 mg via INTRAVENOUS
  Filled 2019-03-26 (×3): qty 2

## 2019-03-26 MED ORDER — LORAZEPAM 2 MG/ML IJ SOLN
0.0000 mg | Freq: Four times a day (QID) | INTRAMUSCULAR | Status: DC
  Administered 2019-03-26 (×2): 2 mg via INTRAVENOUS
  Administered 2019-03-26: 4 mg via INTRAVENOUS
  Administered 2019-03-27: 2 mg via INTRAVENOUS
  Administered 2019-03-28: 1 mg via INTRAVENOUS
  Filled 2019-03-26 (×2): qty 1
  Filled 2019-03-26: qty 2
  Filled 2019-03-26 (×2): qty 1

## 2019-03-26 MED ORDER — LORAZEPAM 2 MG/ML IJ SOLN
1.0000 mg | INTRAMUSCULAR | Status: DC | PRN
  Administered 2019-03-26: 2 mg via INTRAVENOUS
  Administered 2019-03-26: 3 mg via INTRAVENOUS
  Administered 2019-03-29: 2 mg via INTRAVENOUS
  Filled 2019-03-26 (×3): qty 1
  Filled 2019-03-26: qty 2

## 2019-03-26 MED ORDER — SODIUM CHLORIDE 0.9% FLUSH
3.0000 mL | Freq: Two times a day (BID) | INTRAVENOUS | Status: DC
  Administered 2019-03-26 – 2019-03-31 (×9): 3 mL via INTRAVENOUS

## 2019-03-26 MED ORDER — PHENYTOIN 125 MG/5ML PO SUSP: 150.0000 mg | Freq: Three times a day (TID) | ORAL | Status: DC

## 2019-03-26 MED ORDER — SODIUM CHLORIDE 0.9 % IV SOLN
250.0000 mL | INTRAVENOUS | Status: DC | PRN
  Administered 2019-03-26: 250 mL via INTRAVENOUS

## 2019-03-26 MED ORDER — LORAZEPAM 2 MG/ML IJ SOLN
2.0000 mg | Freq: Once | INTRAMUSCULAR | Status: AC
  Administered 2019-03-26: 2 mg via INTRAVENOUS
  Filled 2019-03-26: qty 1

## 2019-03-26 MED ORDER — LORAZEPAM 1 MG PO TABS
1.0000 mg | ORAL_TABLET | ORAL | Status: DC | PRN
  Administered 2019-03-28: 1 mg via ORAL

## 2019-03-26 NOTE — ED Provider Notes (Signed)
[facility] EMERGENCY DEPARTMENT Provider Note   CSN: [id number] Arrival date & time: 03/26/19  0867     History No chief complaint on file.   Alyssa Little is a 38 y.o. female.  Patient is a 38 year old female with history of traumatic brain injury with subarachnoid hemorrhage, seizure disorder, and alcohol abuse.  She is brought to the ER by EMS for evaluation of altered mental status.  From what I am told, the patient was found in her bathtub with the water running.  She was incoherent and not responding.  EMS arrived and was found to have stable vital signs and protecting her airway.  She was transported here uneventfully.  She arrived confused, not cooperating, and appeared somewhat disheveled.  She is unable to add any meaningful history secondary to acuity of her condition.  The history is provided by the patient.       Past Medical History:  Diagnosis Date  . Alcohol abuse   . SAH (subarachnoid hemorrhage) (HCC)   . Seizure disorder (HCC)   . TBI (traumatic brain injury) [facility])     Patient Active Problem List   Diagnosis Date Noted  . Vitamin deficiency 09/29/2018  . Malnutrition (HCC) 09/29/2018  . Underweight 09/29/2018  . Alcohol use 09/29/2018  . Diarrhea 09/29/2018  . Secondary amenorrhea 09/29/2018  . Rosacea 09/29/2018  . Mood change 09/29/2018  . Seizure-like activity (HCC) 09/29/2018  . Cough 09/29/2018  . Elevated liver enzymes 09/29/2018  . Anemia 09/29/2018  . Encephalopathy acute 06/24/2018  . Lactic acidemia 06/24/2018  . Open scalp wound 04/22/2018  . Transaminitis   . TBI (traumatic brain injury) (HCC) 03/05/2018  . ETOH abuse   . Tachypnea   . Traumatic brain injury with loss of consciousness (HCC)   . Sinus tachycardia   . Seizures (HCC)   . Acute blood loss anemia   . Malnutrition of moderate degree 02/26/2018  . SAH (subarachnoid hemorrhage) (HCC) 02/24/2018    Past Surgical History:  Procedure  Laterality Date  . NO PAST SURGERIES       OB History   No obstetric history on file.     No family history on file.  Social History   Tobacco Use  . Smoking status: Current Every Day Smoker    Packs/day: 0.25    Types: Cigarettes  . Smokeless tobacco: Never Used  Substance Use Topics  . Alcohol use: Yes    Alcohol/week: 1.0 standard drinks    Types: 1 Glasses of wine per week    Comment: 1 drink per day  . Drug use: Never    Home Medications Prior to Admission medications   Medication Sig Start Date End Date Taking? Authorizing Provider  doxycycline (VIBRA-TABS) 100 MG tablet Take 100 mg by mouth 2 (two) times daily. For 10 days. Finished on 02-26-13    [provider]  levETIRAcetam (KEPPRA) 750 MG tablet Take 2 tablets (1,500 mg total) by mouth 2 (two) times daily. 09/07/18   Garlon Hatchet, PA-C  Multiple Vitamin (MULTIVITAMIN WITH MINERALS) TABS tablet Take 1 tablet by mouth daily. 03/11/18   Angiulli, Mcarthur Rossetti, PA-C  phenytoin (DILANTIN) 125 MG/5ML suspension Take 6 mLs (150 mg total) by mouth 3 (three) times daily. Patient not taking: Reported on 09/06/2018 03/11/18   Charlton Amor, PA-C    Allergies    Shellfish allergy, Penicillins, Shellfish allergy, and Penicillins  Review of Systems   Review of Systems  Unable to perform ROS:  Acuity of condition    Physical Exam Updated Vital Signs BP (!) 126/96   Pulse (!) 131   Temp 99.7 F (37.6 C) (Rectal)   Resp (!) 26   SpO2 99%   Physical Exam Vitals and nursing note reviewed.  Constitutional:      General: She is not in acute distress.    Appearance: She is well-developed. She is not diaphoretic.  HENT:     Head: Normocephalic.     Comments: There is a hematoma to the left forehead.  There is no palpable skull defect.    Right Ear: Tympanic membrane normal.     Left Ear: Tympanic membrane normal.  Eyes:     Extraocular Movements: Extraocular movements intact.     Pupils: Pupils are  equal, round, and reactive to light.  Cardiovascular:     Rate and Rhythm: Normal rate and regular rhythm.     Heart sounds: No murmur. No friction rub. No gallop.   Pulmonary:     Effort: Pulmonary effort is normal. No respiratory distress.     Breath sounds: Normal breath sounds. No wheezing.  Abdominal:     General: Bowel sounds are normal. There is no distension.     Palpations: Abdomen is soft.     Tenderness: There is no abdominal tenderness.  Musculoskeletal:        General: Normal range of motion.     Cervical back: Normal range of motion and neck supple.  Skin:    General: Skin is warm and dry.  Neurological:     Mental Status: She is alert.     Comments: Neurologically, patient is difficult to assess secondary to acuity of condition.  She is attempting to climb over the rail of the bed and not answering questions.  She appears confused and disheveled.     ED Results / Procedures / Treatments   Labs (all labs ordered are listed, but only abnormal results are displayed) Labs Reviewed  ACETAMINOPHEN LEVEL - Abnormal; Notable for the following components:      Result Value   Acetaminophen (Tylenol), Serum <10 (*)    All other components within normal limits  COMPREHENSIVE METABOLIC PANEL - Abnormal; Notable for the following components:   Sodium 134 (*)    Potassium 5.6 (*)    CO2 13 (*)    Glucose, Bld 127 (*)    BUN 5 (*)    Calcium 8.3 (*)    AST 277 (*)    ALT 119 (*)    Alkaline Phosphatase 147 (*)    Total Bilirubin 3.1 (*)    Anion gap 23 (*)    All other components within normal limits  PHENYTOIN LEVEL, TOTAL - Abnormal; Notable for the following components:   Phenytoin Lvl <2.5 (*)    All other components within normal limits  URINALYSIS, ROUTINE W REFLEX MICROSCOPIC - Abnormal; Notable for the following components:   Glucose, UA 150 (*)    Hgb urine dipstick MODERATE (*)    Ketones, ur 80 (*)    Protein, ur 100 (*)    All other components within  normal limits  CBC WITH DIFFERENTIAL/PLATELET - Abnormal; Notable for the following components:   WBC 15.7 (*)    RBC 3.28 (*)    HCT 35.2 (*)    MCV 107.3 (*)    MCH 36.6 (*)    Neutro Abs 14.3 (*)    Lymphs Abs 0.3 (*)    Monocytes Absolute 1.1 (*)  Abs Immature Granulocytes 0.08 (*)    All other components within normal limits  SARS CORONAVIRUS 2 (TAT 6-24 HRS)  CULTURE, BLOOD (ROUTINE X 2) W REFLEX TO ID PANEL  CULTURE, BLOOD (ROUTINE X 2) W REFLEX TO ID PANEL  LIPASE, BLOOD  PROTIME-INR  SALICYLATE LEVEL  HCG, QUANTITATIVE, PREGNANCY  RAPID URINE DRUG SCREEN, HOSP PERFORMED  CBC WITH DIFFERENTIAL/PLATELET    EKG None  Radiology DG Chest Port 1 View  Result Date: 03/26/2019 CLINICAL DATA:  Found down in bathtub. Altered mental status. EXAM: PORTABLE CHEST 1 VIEW COMPARISON:  Radiograph 06/24/2018 FINDINGS: The cardiomediastinal contours are normal. The lungs are clear. Pulmonary vasculature is normal. No consolidation, pleural effusion, or pneumothorax. No acute osseous abnormalities are seen. IMPRESSION: Unremarkable single AP view of the chest. Electronically Signed   By: Keith Rake M.D.   On: 03/26/2019 03:23    Procedures Procedures (including critical care time)  Medications Ordered in ED Medications  LORazepam (ATIVAN) 2 MG/ML injection (2 mg  Given During Downtime 03/26/19 0306)  ondansetron (ZOFRAN) 4 MG/2ML injection (4 mg  Given During Downtime 03/26/19 0307)    ED Course  I have reviewed the triage vital signs and the nursing notes.  Pertinent labs & imaging results that were available during my care of the patient were reviewed by me and considered in my medical decision making (see chart for details).  Patient is a 38 year old female with history of chronic alcoholism and traumatic brain injury with seizure disorder.  She presents today by EMS for evaluation of altered mental status.  She was apparently found in the bathtub by her boyfriend  after not having been heard from since the day before.  She was confused, uncooperative, and disoriented during transport and remained that way upon arrival to the ED.  On exam, she has has bruising to her forehead as well as left forearm.  Work-up reveals elevations of her transaminases and total bilirubin.  CBC is basically unremarkable and phenytoin level is undetectable.  CT scan of the head and cervical spine show no intracranial injury or cervical fine fracture, only a hematoma to the forehead.  Patient continues to be uncooperative and disoriented.  On several occasions she appeared to be reaching for objects that were not there.  She was tachycardic as well, raising the concern for possible withdrawal from alcohol.  Patient was given IV Ativan and ultimately had some improvement of her mental status.  She was seen by Dr. Leonel Ramsay from neurology.  He suspects either alcohol withdrawal or possibly status epilepticus related to medication noncompliance.  Patient will be given fosphenytoin and will be admitted to the hospitalist service for further evaluation.  I have spoken with Dr. Maudie Mercury who agrees to admit.  CRITICAL CARE Performed by: Veryl Speak Total critical care time: 45 minutes Critical care time was exclusive of separately billable procedures and treating other patients. Critical care was necessary to treat or prevent imminent or life-threatening deterioration. Critical care was time spent personally by me on the following activities: development of treatment plan with patient and/or surrogate as well as nursing, discussions with consultants, evaluation of patient's response to treatment, examination of patient, obtaining history from patient or surrogate, ordering and performing treatments and interventions, ordering and review of laboratory studies, ordering and review of radiographic studies, pulse oximetry and re-evaluation of patient's condition.   MDM Rules/Calculators/A&P    Final Clinical Impression(s) / ED Diagnoses Final diagnoses:  AMS (altered mental status)    Rx /  DC Orders ED Discharge Orders    None       Geoffery Lyonselo, Baley Lorimer, MD 03/26/19 [phone]

## 2019-03-26 NOTE — Progress Notes (Signed)
Patient ID: Alyssa Little, female   DOB: [birth year], 38 y.o.   MRN: [id number]  Patient seen: oriented to person, cooperative, but remains confused. I have reviewed her H&P, labs, MRI. Continue plan as below:  AMS   Check EEG   Check b12, esr, tsh, rpr, ammonia   Monitor   Hyperkalemia   S/p cal gluconate, kayexelate, sodium bicarb  Overcorrected. Recheck BMP this afternoon. If remains low, will replace.  Abnormal liver function   Check cpk (would not be surprising if has rhabdo)   Check acute hepatitis panel   Check RUQ ultrasound, please follow up on this   Check cmp tomorrow am   AG acidosis  Corrected  Leukocytosis  CBC in AM  Alcoholism  CIWA   Seizure do  Cont Keppra 1528m iv bid  Cont dilantin  Will consult neurology if not improving in terms of AMS  STruett Mainland DO 03/26/2019

## 2019-03-26 NOTE — Procedures (Addendum)
Patient Name: Alyssa Little  MRN: [id number]  Epilepsy Attending: Lora Havens  Referring Physician/Provider: Dr. Kathrynn Speed Date: 03/26/2019 Duration: 23.22 minutes  Patient history: 38 year old female history of seizures who presented with altered mental status.  EEG to evaluate for seizures.  Level of alertness: Awake/lethargic  AEDs during EEG study: Lorazepam, Keppra, phenytoin  Technical aspects: This EEG study was done with scalp electrodes positioned according to the 10-20 International system of electrode placement. Electrical activity was acquired at a sampling rate of 500Hz  and reviewed with a high frequency filter of 70Hz  and a low frequency filter of 1Hz . EEG data were recorded continuously and digitally stored.   Description: During awake state, no clear posterior dominant rhythm was seen.  EEG showed continuous generalized 2 to 5 Hz theta-delta slowing with an overriding 15 to 18 Hz, 2-3 uV beta activity with irregular morphology distributed symmetrically and diffusely. Hyperventilation and photic stimulation were not performed.  Abnormality -Continuous slow, generalized -Excessive beta, generalized  IMPRESSION: This study is suggestive of moderate to severe diffuse encephalopathy, nonspecific to etiology. The excessive beta activity seen in the background is most likely due to the effect of benzodiazepine and is a benign EEG pattern. No seizures or epileptiform discharges were seen throughout the recording.  Brie Eppard Barbra Sarks

## 2019-03-26 NOTE — Consult Note (Signed)
Neurology Consultation Reason for Consult: Altered mental status Referring Physician: Delo, D  CC: Altered mental status  History is obtained from: Patient, chart review  HPI: Alyssa Little is a 38 y.o. female with a history of alcohol abuse and seizure disorder who was found tonight confused.  She apparently was found in her bathtub with water running.  She was incoherent and not responding.  She was brought into the emergency department where she was not following commands and was very confused and responding to unseen stimuli.  She was given Ativan loaded with fosphenytoin due to a subtherapeutic level.  At the time of my evaluation, she had improved and was answering some simple questions though is still confused to some degree.   ROS:  Unable to obtain due to altered mental status.   Past Medical History:  Diagnosis Date  . Alcohol abuse   . SAH (subarachnoid hemorrhage) ([place])   . Seizure disorder ([place])   . TBI (traumatic brain injury) ([place])      Family history: Unable to obtain due to altered mental status   Social History:  reports that she has been smoking cigarettes. She has been smoking about 0.25 packs per day. She has never used smokeless tobacco. She reports current alcohol use of about 1.0 standard drinks of alcohol per week. She reports that she does not use drugs.   Exam: Current vital signs: BP (!) 140/99   Pulse (!) 133   Temp 99.7 F (37.6 C) (Rectal)   Resp (!) 21   Wt 45.4 kg   SpO2 99%   BMI 18.29 kg/m  Vital signs in last 24 hours: Temp:  [99.7 F (37.6 C)] 99.7 F (37.6 C) (12/10 0307) Pulse Rate:  [114-143] 133 (12/10 0441) Resp:  [16-26] 21 (12/10 0345) BP: (124-158)/(96-119) 140/99 (12/10 0441) SpO2:  [95 %-100 %] 99 % (12/10 0245) Weight:  [45.4 kg] 45.4 kg (12/10 0441)   Physical Exam  Constitutional: Appears well-developed and well-nourished.  Psych: Affect appropriate to situation Eyes: No scleral injection HENT: No OP  obstrucion MSK: no joint deformities.  Cardiovascular: Normal rate and regular rhythm.  Respiratory: Effort normal, non-labored breathing GI: Soft.  No distension. There is no tenderness.  Skin: bifacial rash  Neuro: Mental Status: Patient is awake, alert, states that she is "in her hotel" she is able to follow commands and answer simple questions, though she is confused. Cranial Nerves: II: Visual Fields are full. Pupils are equal, round, and reactive to light.   III,IV, VI: EOMI without ptosis or diploplia.  V: Facial sensation is symmetric to temperature VII: Facial movement is symmetric.  VIII: hearing is intact to voice X: Uvula elevates symmetrically XI: Shoulder shrug is symmetric. XII: tongue is midline without atrophy or fasciculations.  Motor: Tone is normal. Bulk is normal. 5/5 strength was present in all four extremities.  Sensory: Sensation is symmetric to light touch and temperature in the arms and legs. Cerebellar: She does not perform   I have reviewed labs in epic and the results pertinent to this consultation are: WBC of 15.7 PHT < 2.5 Sodium 134 Potassium of 5.6 AST 277 ALT 119 T bili 3.1   I have reviewed the images obtained: CT head-no acute intracranial findings, she does have a scalp hematoma  Impression: 38 year old female with altered mental status in the setting of hitting her head in the bathroom.  Possibilities include concussive syndrome, status epilepticus(aborted with fosphenytoin and Ativan), seizure with postictal state, alcohol withdrawal.  Given her previous admission with similar presentation, I actually suspect that this was status epilepticus which has been broken.  With her now following commands readily improving after Ativan/fosphenytoin I think this is most likely.  Though she states that she is compliant, I do not know that she is currently reliable I would favor rediscussing it with her as she continues to  clear.  Recommendations: 1) EEG 2) resume phenytoin 150 3 times daily 3) resume home Keppra 1500 twice daily 4) magnesium  5) neurology will follow   Ritta Slot, MD [facility] [phone]  If 7pm- 7am, please page neurology on call as listed in AMION.

## 2019-03-26 NOTE — Progress Notes (Signed)
PHARMACY - PHYSICIAN COMMUNICATION CRITICAL VALUE ALERT - BLOOD CULTURE IDENTIFICATION (BCID)  Micro lab called with [place] 1/2 GPC WBC slightly elevated, Tm99, LA 1.1 UA negative   Name of physician (or Provider) Contacted: Colon  Changes to prescribed antibiotics required: none noted  Bonnita Nasuti Pharm.D. CPP, BCPS Clinical Pharmacist [phone] 03/26/2019 8:22 PM

## 2019-03-26 NOTE — ED Notes (Signed)
Back from MRI. Placed on monitor

## 2019-03-26 NOTE — Progress Notes (Signed)
EEG Completed; Results Pending  

## 2019-03-26 NOTE — ED Triage Notes (Signed)
Pt comes from home via [facility] EMS, found by boyfriend in the bathtub, hx of seizures and ETOH use, drinks heavily daily, LSN yesterday. Disoriented x 4 and not following commands. Pt had one episode of vomiting, also has hematoma to L forehead.

## 2019-03-26 NOTE — ED Notes (Signed)
Pt taken to MRI  

## 2019-03-26 NOTE — H&P (Addendum)
TRH H&P    Patient Demographics:    Alyssa Little, is a 38 y.o. female  MRN: [id number]  DOB - October 07, 1980  Admit Date - 03/26/2019  Referring MD/NP/PA:   Stark Jock  Outpatient Primary MD for the patient is Tysinger, Camelia Eng, PA-C  Patient coming from:  home  Chief complaint- altered mental status   HPI:    Alyssa Little  is a 38 y.o. female,  w h/o SAH, TBI, seizure do, apparently presents with altered mental status, found in bathtub. Apparently history was given by boyfriend.  Per RN , boyfriend states that she stopped drinking about 24 hours ago.   In ED,  T 99.7, P 1400, R 16, Bp 153/106  Pox 100% on RA Wt 45.4kg  CT brain/ C spine IMPRESSION: 1. Large left frontal scalp hematoma. No acute intracranial abnormality. No skull fracture. 2. No acute fracture or subluxation of the cervical spine.  Na 134, K 5.6 Bun 5, Creatinine 0.73 Ast 277, Alt 119, Alk phos 147, T. Bili 3.1 INR 1.0 Salicylate <7 Urinalysis + ketones UDS negative Wbc 15.7, hgb 12.0, Plt 199, mcv 107  Dilantin level <2.5  Pt will be admitted for AMS     Review of systems:    In addition to the HPI above, pt is unable to provide meaningful history due to AMS  No Fever-chills, No Headache, No changes with Vision or hearing, No problems swallowing food or Liquids, No Chest pain, Cough or Shortness of Breath, No Abdominal pain, No Nausea or Vomiting, bowel movements are regular, No Blood in stool or Urine, No dysuria, No new skin rashes or bruises, No new joints pains-aches,  No new weakness, tingling, numbness in any extremity, No recent weight gain or loss, No polyuria, polydypsia or polyphagia, No significant Mental Stressors.  All other systems reviewed and are negative.    Past History of the following :    Past Medical History:  Diagnosis Date  . Alcohol abuse   . SAH (subarachnoid hemorrhage) ([place])    . Seizure disorder ([place])   . TBI (traumatic brain injury) [facility])       Past Surgical History:  Procedure Laterality Date  . CHOLECYSTECTOMY         Social History:      Social History   Tobacco Use  . Smoking status: Current Every Day Smoker    Packs/day: 0.25    Types: Cigarettes  . Smokeless tobacco: Never Used  Substance Use Topics  . Alcohol use: Yes    Alcohol/week: 1.0 standard drinks    Types: 1 Glasses of wine per week    Comment: 1 drink per day       Family History :    No family history on file.  pt is unable to provide due to AMS   Home Medications:   Prior to Admission medications   Medication Sig Start Date End Date Taking? Authorizing Provider  doxycycline (VIBRA-TABS) 100 MG tablet Take 100 mg by mouth 2 (two) times daily. For 10 days. Finished on  02-26-13    [provider]  levETIRAcetam (KEPPRA) 750 MG tablet Take 2 tablets (1,500 mg total) by mouth 2 (two) times daily. 09/07/18   Larene Pickett, PA-C  Multiple Vitamin (MULTIVITAMIN WITH MINERALS) TABS tablet Take 1 tablet by mouth daily. 03/11/18   Angiulli, Lavon Paganini, PA-C  phenytoin (DILANTIN) 125 MG/5ML suspension Take 6 mLs (150 mg total) by mouth 3 (three) times daily. Patient not taking: Reported on 09/06/2018 03/11/18   Cathlyn Parsons, PA-C     Allergies:     Allergies  Allergen Reactions  . Shellfish Allergy Anaphylaxis  . Penicillins Other (See Comments)    Unknown per family and friend  Has patient had a PCN reaction causing immediate rash, facial/tongue/throat swelling, SOB or lightheadedness with hypotension: Unknown Has patient had a PCN reaction causing severe rash involving mucus membranes or skin necrosis: Unknown Has patient had a PCN reaction that required hospitalization: Unknown Has patient had a PCN reaction occurring within the last 10 years: Unknown If all of the above answers are "NO", then may proceed with Cephalosporin use.   . Shellfish Allergy  Other (See Comments)    Unknown per family and friend  . Penicillins Hives and Rash     Physical Exam:   Vitals  Blood pressure (!) 120/100, pulse (!) 141, temperature 99.7 F (37.6 C), temperature source Rectal, resp. rate 18, weight 45.4 kg, SpO2 100 %.  1.  General: axoxo1 (person, not place or time)  2. Psychiatric: euthymmic  3. Neurologic: Cn2-12 intact, reflexes 2+ symmetric, diffuse with no clonus, motor 5/5 in all 4 ext   4. HEENMT:  Icteric, pupils 39m symmetric, direct, consensual , near intact Neck: no jvd  5. Respiratory : CTAB  6. Cardiovascular : Tachy s1, s2, no m/g/r  7. Gastrointestinal:  ABd: soft, nt, nd, +bs  8. Skin:  Ext: no c/c/e,  No palmar erythema,  Slight spiders on chest  9.Musculoskeletal:  Good ROM    Data Review:    CBC Recent Labs  Lab 03/26/19 0247  WBC 15.7*  HGB 12.0  HCT 35.2*  PLT 199  MCV 107.3*  MCH 36.6*  MCHC 34.1  RDW 13.3  LYMPHSABS 0.3*  MONOABS 1.1*  EOSABS 0.0  BASOSABS 0.0   ------------------------------------------------------------------------------------------------------------------  Results for orders placed or performed during the hospital encounter of 03/26/19 (from the past 48 hour(s))  Acetaminophen level     Status: Abnormal   Collection Time: 03/26/19  2:10 AM  Result Value Ref Range   Acetaminophen (Tylenol), Serum <10 (L) 10 - 30 ug/mL    Comment: (NOTE) Therapeutic concentrations vary significantly. A range of 10-30 ug/mL  may be an effective concentration for many patients. However, some  are best treated at concentrations outside of this range. Acetaminophen concentrations >150 ug/mL at 4 hours after ingestion  and >50 ug/mL at 12 hours after ingestion are often associated with  toxic reactions. Performed at [facility] [address] [place] [address]  Comprehensive metabolic panel     Status: Abnormal   Collection Time: 03/26/19  2:10 AM  Result Value  Ref Range   Sodium 134 (L) 135 - 145 mmol/L   Potassium 5.6 (H) 3.5 - 5.1 mmol/L   Chloride 98 98 - 111 mmol/L   CO2 13 (L) 22 - 32 mmol/L   Glucose, Bld 127 (H) 70 - 99 mg/dL   BUN 5 (L) 6 - 20 mg/dL   Creatinine, Ser 0.73 0.44 -  1.00 mg/dL   Calcium 8.3 (L) 8.9 - 10.3 mg/dL   Total Protein 7.0 6.5 - 8.1 g/dL   Albumin 3.9 3.5 - 5.0 g/dL   AST 277 (H) 15 - 41 U/L   ALT 119 (H) 0 - 44 U/L   Alkaline Phosphatase 147 (H) 38 - 126 U/L   Total Bilirubin 3.1 (H) 0.3 - 1.2 mg/dL   GFR calc non Af Amer >60 >60 mL/min   GFR calc Af Amer >60 >60 mL/min   Anion gap 23 (H) 5 - 15    Comment: Performed at [facility] [address], [place], [place] [address]  Phenytoin level, total     Status: Abnormal   Collection Time: 03/26/19  2:10 AM  Result Value Ref Range   Phenytoin Lvl <2.5 (L) 10.0 - 20.0 ug/mL    Comment: Performed at [facility] [address] [place], [place] [address]  Lipase, blood     Status: None   Collection Time: 03/26/19  2:10 AM  Result Value Ref Range   Lipase 23 11 - 51 U/L    Comment: Performed at [facility] [address] [place], [place] [address]  Protime-INR     Status: None   Collection Time: 03/26/19  2:10 AM  Result Value Ref Range   Prothrombin Time 13.3 11.4 - 15.2 seconds   INR 1.0 0.8 - 1.2    Comment: (NOTE) INR goal varies based on device and disease states. Performed at [facility], [place] [address], [place], [place] [address]   Salicylate level     Status: None   Collection Time: 03/26/19  2:10 AM  Result Value Ref Range   Salicylate Lvl <0.4 2.8 - 30.0 mg/dL    Comment: Performed at [facility] [address] [place], [place] [address]  hCG, quantitative, pregnancy     Status: None   Collection Time: 03/26/19  2:12 AM  Result Value Ref Range   hCG, Beta Chain, Quant, S 1 <5 mIU/mL    Comment:          GEST. AGE      CONC.  (mIU/mL)   <=1 WEEK        5 - 50     2 WEEKS       50 - 500     3 WEEKS        100 - 10,000     4 WEEKS     1,000 - 30,000     5 WEEKS     3,500 - 115,000   6-8 WEEKS     12,000 - 270,000    12 WEEKS     15,000 - 220,000        FEMALE AND NON-PREGNANT FEMALE:     LESS THAN 5 mIU/mL Performed at [facility], [place] [address], [place],  [address]   Urinalysis, Routine w reflex microscopic     Status: Abnormal   Collection Time: 03/26/19  2:13 AM  Result Value Ref Range   Color, Urine YELLOW YELLOW   APPearance CLEAR CLEAR   Specific Gravity, Urine 1.018 1.005 - 1.030   pH 5.0 5.0 - 8.0   Glucose, UA 150 (A) NEGATIVE mg/dL   Hgb urine dipstick MODERATE (A) NEGATIVE   Bilirubin Urine NEGATIVE NEGATIVE   Ketones, ur 80 (A) NEGATIVE mg/dL   Protein, ur 100 (A) NEGATIVE mg/dL   Nitrite NEGATIVE NEGATIVE  Leukocytes,Ua NEGATIVE NEGATIVE   RBC / HPF 0-5 0 - 5 RBC/hpf   WBC, UA 0-5 0 - 5 WBC/hpf   Bacteria, UA NONE SEEN NONE SEEN   Mucus PRESENT    Hyaline Casts, UA PRESENT     Comment: Performed at [facility], [place] [address] [place], [place] [address]  Rapid urine drug screen (hospital performed)     Status: None   Collection Time: 03/26/19  2:14 AM  Result Value Ref Range   Opiates NONE DETECTED NONE DETECTED   Cocaine NONE DETECTED NONE DETECTED   Benzodiazepines NONE DETECTED NONE DETECTED   Amphetamines NONE DETECTED NONE DETECTED   Tetrahydrocannabinol NONE DETECTED NONE DETECTED   Barbiturates NONE DETECTED NONE DETECTED    Comment: (NOTE) DRUG SCREEN FOR MEDICAL PURPOSES ONLY.  IF CONFIRMATION IS NEEDED FOR ANY PURPOSE, NOTIFY LAB WITHIN 5 DAYS. LOWEST DETECTABLE LIMITS FOR URINE DRUG SCREEN Drug Class                     Cutoff (ng/mL) Amphetamine and metabolites    1000 Barbiturate and metabolites    200 Benzodiazepine                 852 Tricyclics and metabolites     300 Opiates and metabolites        300 Cocaine and metabolites        300 THC                            50 Performed at [facility], [place] [address], [place], [place] [address]   CBC with Differential/Platelet     Status: Abnormal   Collection Time: 03/26/19  2:47 AM  Result Value Ref Range   WBC 15.7 (H) 4.0 - 10.5 K/uL   RBC 3.28 (L) 3.87 - 5.11 MIL/uL   Hemoglobin 12.0 12.0 - 15.0 g/dL   HCT 35.2 (L) 36.0 - 46.0 %   MCV 107.3 (H) 80.0 - 100.0 fL   MCH 36.6 (H) 26.0 - 34.0 pg   MCHC 34.1 30.0 - 36.0 g/dL   RDW 13.3 11.5 - 15.5 %   Platelets 199 150 - 400 K/uL   nRBC 0.0 0.0 - 0.2 %   Neutrophils Relative % 90 %   Neutro Abs 14.3 (H) 1.7 - 7.7 K/uL   Lymphocytes Relative 2 %   Lymphs Abs 0.3 (L) 0.7 - 4.0 K/uL   Monocytes Relative 7 %   Monocytes Absolute 1.1 (H) 0.1 - 1.0 K/uL   Eosinophils Relative 0 %   Eosinophils Absolute 0.0 0.0 - 0.5 K/uL   Basophils Relative 0 %   Basophils Absolute 0.0 0.0 - 0.1 K/uL   Immature Granulocytes 1 %   Abs Immature Granulocytes 0.08 (H) 0.00 - 0.07 K/uL    Comment: Performed at [facility] [address] [place], [place] [address]    Chemistries  Recent Labs  Lab 03/26/19 0210  NA 134*  K 5.6*  CL 98  CO2 13*  GLUCOSE 127*  BUN 5*  CREATININE 0.73  CALCIUM 8.3*  AST 277*  ALT 119*  ALKPHOS 147*  BILITOT 3.1*   ------------------------------------------------------------------------------------------------------------------  ------------------------------------------------------------------------------------------------------------------ GFR: Estimated Creatinine Clearance: 67.7 mL/min (by C-G formula based on SCr of 0.73 mg/dL). Liver Function Tests: Recent Labs  Lab 03/26/19 0210  AST 277*  ALT 119*  ALKPHOS 147*  BILITOT 3.1*  PROT 7.0  ALBUMIN 3.9   Recent Labs  Lab 03/26/19 0210  LIPASE 23   No results for input(s): AMMONIA in the last 168 hours. Coagulation Profile: Recent Labs  Lab 03/26/19 0210  INR 1.0   Cardiac Enzymes: No results for input(s): CKTOTAL, CKMB, CKMBINDEX, TROPONINI in the last 168 hours. BNP (last 3 results)  No results for input(s): PROBNP in the last 8760 hours. HbA1C: No results for input(s): HGBA1C in the last 72 hours. CBG: No results for input(s): GLUCAP in the last 168 hours. Lipid Profile: No results for input(s): CHOL, HDL, LDLCALC, TRIG, CHOLHDL, LDLDIRECT in the last 72 hours. Thyroid Function Tests: No results for input(s): TSH, T4TOTAL, FREET4, T3FREE, THYROIDAB in the last 72 hours. Anemia Panel: No results for input(s): VITAMINB12, FOLATE, FERRITIN, TIBC, IRON, RETICCTPCT in the last 72 hours.  --------------------------------------------------------------------------------------------------------------- Urine analysis:    Component Value Date/Time   COLORURINE YELLOW 03/26/2019 [place] 03/26/2019 0213   LABSPEC 1.018 03/26/2019 0213   PHURINE 5.0 03/26/2019 0213   GLUCOSEU 150 (A) 03/26/2019 0213   HGBUR MODERATE (A) 03/26/2019 0213   BILIRUBINUR NEGATIVE 03/26/2019 0213   KETONESUR 80 (A) 03/26/2019 0213   PROTEINUR 100 (A) 03/26/2019 0213   NITRITE NEGATIVE 03/26/2019 0213   LEUKOCYTESUR NEGATIVE 03/26/2019 4196      Imaging Results:    CT Head Wo Contrast  Result Date: 03/26/2019 CLINICAL DATA:  Altered level of consciousness (LOC), unexplained; Polytrauma, critical, head/C-spine injury suspected Found on bathtub. Hematoma to left side of head. EXAM: CT HEAD WITHOUT CONTRAST CT CERVICAL SPINE WITHOUT CONTRAST TECHNIQUE: Multidetector CT imaging of the head and cervical spine was performed following the standard protocol without intravenous contrast. Multiplanar CT image reconstructions of the cervical spine were also generated. COMPARISON:  Head CT 09/06/2018. Head and cervical spine CT 02/24/2018 FINDINGS: CT HEAD FINDINGS Brain: No intracranial hemorrhage, mass effect, or midline shift. Previous intracranial hemorrhage has resolved. Minimal encephalomalacia in the anterior right frontal lobe at site of prior hemorrhage. Mild generalized atrophy  for age. The basilar cisterns are patent. No evidence of territorial infarct or acute ischemia. No extra-axial or intracranial fluid collection. Vascular: No hyperdense vessel. Skull: No fracture or focal lesion. Sinuses/Orbits: Partially obscured by motion. Grossly negative. Other: Large left frontal scalp hematoma. CT CERVICAL SPINE FINDINGS Alignment: Normal. Skull base and vertebrae: No acute fracture. Cervical vertebral body heights are maintained. Slight loss of height of superior endplate of T1 and T2, appears chronic but new from 02/24/2018. The dens and skull base are intact. Soft tissues and spinal canal: No prevertebral fluid or swelling. No visible canal hematoma. Disc levels:  Preserved. Upper chest: Negative. Other: None. IMPRESSION: 1. Large left frontal scalp hematoma. No acute intracranial abnormality. No skull fracture. 2. No acute fracture or subluxation of the cervical spine. Electronically Signed   By: Keith Rake M.D.   On: 03/26/2019 04:42   CT Cervical Spine Wo Contrast  Result Date: 03/26/2019 CLINICAL DATA:  Altered level of consciousness (LOC), unexplained; Polytrauma, critical, head/C-spine injury suspected Found on bathtub. Hematoma to left side of head. EXAM: CT HEAD WITHOUT CONTRAST CT CERVICAL SPINE WITHOUT CONTRAST TECHNIQUE: Multidetector CT imaging of the head and cervical spine was performed following the standard protocol without intravenous contrast. Multiplanar CT image reconstructions of the cervical spine were also generated. COMPARISON:  Head CT 09/06/2018. Head and cervical spine CT 02/24/2018 FINDINGS: CT HEAD FINDINGS Brain: No intracranial hemorrhage, mass effect, or midline shift. Previous intracranial hemorrhage has resolved. Minimal  encephalomalacia in the anterior right frontal lobe at site of prior hemorrhage. Mild generalized atrophy for age. The basilar cisterns are patent. No evidence of territorial infarct or acute ischemia. No extra-axial or  intracranial fluid collection. Vascular: No hyperdense vessel. Skull: No fracture or focal lesion. Sinuses/Orbits: Partially obscured by motion. Grossly negative. Other: Large left frontal scalp hematoma. CT CERVICAL SPINE FINDINGS Alignment: Normal. Skull base and vertebrae: No acute fracture. Cervical vertebral body heights are maintained. Slight loss of height of superior endplate of T1 and T2, appears chronic but new from 02/24/2018. The dens and skull base are intact. Soft tissues and spinal canal: No prevertebral fluid or swelling. No visible canal hematoma. Disc levels:  Preserved. Upper chest: Negative. Other: None. IMPRESSION: 1. Large left frontal scalp hematoma. No acute intracranial abnormality. No skull fracture. 2. No acute fracture or subluxation of the cervical spine. Electronically Signed   By: Keith Rake M.D.   On: 03/26/2019 04:42   DG Chest Port 1 View  Result Date: 03/26/2019 CLINICAL DATA:  Found down in bathtub. Altered mental status. EXAM: PORTABLE CHEST 1 VIEW COMPARISON:  Radiograph 06/24/2018 FINDINGS: The cardiomediastinal contours are normal. The lungs are clear. Pulmonary vasculature is normal. No consolidation, pleural effusion, or pneumothorax. No acute osseous abnormalities are seen. IMPRESSION: Unremarkable single AP view of the chest. Electronically Signed   By: Keith Rake M.D.   On: 03/26/2019 03:23   ekg st at 140, nl axis, nl int, no st-t changes c/w ischemia   Assessment & Plan:    Active Problems:   Seizures (HCC)   ETOH abuse   Altered mental status   Hyperkalemia   Hyponatremia   Abnormal liver function  AMS Check EEG Check b12, esr, tsh, rpr, ammonia Monitor  Hyperkalemia calcium gluconate Sodium bicarbonate Kayexalate Check bmp at 11am  Hyponatremia Hydrate with ns iv  Abnormal liver function Check cpk (would not be surprising if has rhabdo) Check acute hepatitis panel Check RUQ ultrasound, please follow up on this Check cmp  tomorrow am  AG acidosis  Likely due to Alcholic ketoacidosis Check lactic acid  Leukocytosis Check cbc in am  Alcoholism CIWA  Seizure do Cont Keppra 1579m iv bid Cont dilantin  Please consult neurology in AM if not improving in terms of AMS    DVT Prophylaxis-   SCDs   AM Labs Ordered, also please review Full Orders  Family Communication: Admission, patients condition and plan of care including tests being ordered have been discussed with the patient  who indicate understanding and agree with the plan and Code Status.  Code Status:  FULL CODE per patient  Admission status: Observation : Based on patients clinical presentation and evaluation of above clinical data, I have made determination that patient meets observation criteria at this time.   Time spent in minutes : 70 minutes   JJani GravelM.D on 03/26/2019 at 6:16 AM

## 2019-03-26 NOTE — ED Notes (Signed)
Breakfast Ordered 

## 2019-03-26 NOTE — ED Notes (Signed)
Patient transported to Ultrasound 

## 2019-03-26 NOTE — ED Notes (Signed)
Tele

## 2019-03-27 ENCOUNTER — Inpatient Hospital Stay (HOSPITAL_COMMUNITY): Payer: Self-pay

## 2019-03-27 DIAGNOSIS — G40901 Epilepsy, unspecified, not intractable, with status epilepticus: Secondary | ICD-10-CM

## 2019-03-27 DIAGNOSIS — F17210 Nicotine dependence, cigarettes, uncomplicated: Secondary | ICD-10-CM

## 2019-03-27 DIAGNOSIS — W228XXA Striking against or struck by other objects, initial encounter: Secondary | ICD-10-CM

## 2019-03-27 DIAGNOSIS — E875 Hyperkalemia: Secondary | ICD-10-CM

## 2019-03-27 DIAGNOSIS — R945 Abnormal results of liver function studies: Secondary | ICD-10-CM

## 2019-03-27 DIAGNOSIS — E876 Hypokalemia: Secondary | ICD-10-CM

## 2019-03-27 DIAGNOSIS — R7881 Bacteremia: Secondary | ICD-10-CM

## 2019-03-27 DIAGNOSIS — R4182 Altered mental status, unspecified: Secondary | ICD-10-CM | POA: Diagnosis present

## 2019-03-27 DIAGNOSIS — R21 Rash and other nonspecific skin eruption: Secondary | ICD-10-CM

## 2019-03-27 DIAGNOSIS — B9561 Methicillin susceptible Staphylococcus aureus infection as the cause of diseases classified elsewhere: Secondary | ICD-10-CM

## 2019-03-27 DIAGNOSIS — Y93E1 Activity, personal bathing and showering: Secondary | ICD-10-CM

## 2019-03-27 DIAGNOSIS — G9340 Encephalopathy, unspecified: Secondary | ICD-10-CM

## 2019-03-27 DIAGNOSIS — Z880 Allergy status to penicillin: Secondary | ICD-10-CM

## 2019-03-27 DIAGNOSIS — F1010 Alcohol abuse, uncomplicated: Secondary | ICD-10-CM

## 2019-03-27 DIAGNOSIS — Z91013 Allergy to seafood: Secondary | ICD-10-CM

## 2019-03-27 DIAGNOSIS — R7401 Elevation of levels of liver transaminase levels: Secondary | ICD-10-CM

## 2019-03-27 DIAGNOSIS — E871 Hypo-osmolality and hyponatremia: Secondary | ICD-10-CM

## 2019-03-27 DIAGNOSIS — S0083XA Contusion of other part of head, initial encounter: Secondary | ICD-10-CM

## 2019-03-27 DIAGNOSIS — Z87820 Personal history of traumatic brain injury: Secondary | ICD-10-CM

## 2019-03-27 HISTORY — DX: Methicillin susceptible Staphylococcus aureus infection as the cause of diseases classified elsewhere: B95.61

## 2019-03-27 LAB — BASIC METABOLIC PANEL
Anion gap: 10 (ref 5–15)
BUN: <5 mg/dL — ABNORMAL LOW (ref 6–20)
CO2: 23 mmol/L (ref 22–32)
Calcium: 8.3 mg/dL — ABNORMAL LOW (ref 8.9–10.3)
Chloride: 102 mmol/L (ref 98–111)
Creatinine, Ser: 0.46 mg/dL (ref 0.44–1.00)
GFR calc Af Amer: >60 mL/min (ref >60)
GFR calc non Af Amer: >60 mL/min (ref >60)
Glucose, Bld: 80 mg/dL (ref 70–99)
Potassium: 3.2 mmol/L — ABNORMAL LOW (ref 3.5–5.1)
Sodium: 135 mmol/L (ref 135–145)

## 2019-03-27 LAB — CBC
HCT: 32.9 % — ABNORMAL LOW (ref 36.0–46.0)
Hemoglobin: 10.9 g/dL — ABNORMAL LOW (ref 12.0–15.0)
MCH: 36.3 pg — ABNORMAL HIGH (ref 26.0–34.0)
MCHC: 33.1 g/dL (ref 30.0–36.0)
MCV: 109.7 fL — ABNORMAL HIGH (ref 80.0–100.0)
Platelets: 156 10*3/uL (ref 150–400)
RBC: 3 MIL/uL — ABNORMAL LOW (ref 3.87–5.11)
RDW: 13.5 % (ref 11.5–15.5)
WBC: 7.4 10*3/uL (ref 4.0–10.5)
nRBC: 0 % (ref 0.0–0.2)

## 2019-03-27 LAB — COMPREHENSIVE METABOLIC PANEL
ALT: 77 U/L — ABNORMAL HIGH (ref 0–44)
AST: 148 U/L — ABNORMAL HIGH (ref 15–41)
Albumin: 3.2 g/dL — ABNORMAL LOW (ref 3.5–5.0)
Alkaline Phosphatase: 121 U/L (ref 38–126)
Anion gap: 14 (ref 5–15)
BUN: <5 mg/dL — ABNORMAL LOW (ref 6–20)
CO2: 24 mmol/L (ref 22–32)
Calcium: 8.9 mg/dL (ref 8.9–10.3)
Chloride: 103 mmol/L (ref 98–111)
Creatinine, Ser: 0.58 mg/dL (ref 0.44–1.00)
GFR calc Af Amer: >60 mL/min (ref >60)
GFR calc non Af Amer: >60 mL/min (ref >60)
Glucose, Bld: 75 mg/dL (ref 70–99)
Potassium: 2.6 mmol/L — CL (ref 3.5–5.1)
Sodium: 141 mmol/L (ref 135–145)
Total Bilirubin: 1.6 mg/dL — ABNORMAL HIGH (ref 0.3–1.2)
Total Protein: 6.3 g/dL — ABNORMAL LOW (ref 6.5–8.1)

## 2019-03-27 LAB — HEPATITIS PANEL, ACUTE
HCV Ab: NONREACTIVE
Hep A IgM: NONREACTIVE
Hep B C IgM: NONREACTIVE
Hepatitis B Surface Ag: NONREACTIVE

## 2019-03-27 LAB — HIV ANTIBODY (ROUTINE TESTING W REFLEX): HIV Screen 4th Generation wRfx: NONREACTIVE

## 2019-03-27 LAB — MAGNESIUM: Magnesium: 1.3 mg/dL — ABNORMAL LOW (ref 1.7–2.4)

## 2019-03-27 MED ORDER — POTASSIUM CHLORIDE 10 MEQ/100ML IV SOLN
10.0000 meq | INTRAVENOUS | Status: AC
  Administered 2019-03-27 (×5): 10 meq via INTRAVENOUS
  Filled 2019-03-27 (×5): qty 100

## 2019-03-27 MED ORDER — MAGNESIUM SULFATE 2 GM/50ML IV SOLN
2.0000 g | Freq: Once | INTRAVENOUS | Status: AC
  Administered 2019-03-27: 2 g via INTRAVENOUS
  Filled 2019-03-27: qty 50

## 2019-03-27 MED ORDER — POTASSIUM CHLORIDE CRYS ER 20 MEQ PO TBCR: 40.0000 meq | EXTENDED_RELEASE_TABLET | Freq: Three times a day (TID) | ORAL | Status: DC

## 2019-03-27 MED ORDER — VANCOMYCIN HCL IN DEXTROSE 750-5 MG/150ML-% IV SOLN
750.0000 mg | INTRAVENOUS | Status: DC
  Filled 2019-03-27: qty 150

## 2019-03-27 MED ORDER — VANCOMYCIN HCL IN DEXTROSE 750-5 MG/150ML-% IV SOLN
750.0000 mg | Freq: Once | INTRAVENOUS | Status: DC
  Filled 2019-03-27: qty 150

## 2019-03-27 MED ORDER — VANCOMYCIN HCL IN DEXTROSE 1-5 GM/200ML-% IV SOLN
1000.0000 mg | Freq: Once | INTRAVENOUS | Status: AC
  Administered 2019-03-27: 1000 mg via INTRAVENOUS
  Filled 2019-03-27: qty 200

## 2019-03-27 NOTE — Progress Notes (Addendum)
NEURO HOSPITALIST PROGRESS NOTE   Subjective: Patient in bed, awake, alert, NAD. Bruise on left forehead from fall. Patient speaks in low tone.   Exam: Vitals:   03/27/19 0551 03/27/19 0818  BP: 101/72 103/72  Pulse: (!) 102 99  Resp: 17 18  Temp: 98.6 F (37 C) 98.2 F (36.8 C)  SpO2: 100% 99%    Physical Exam  Constitutional: Appears well-developed and well-nourished.  Psych: Affect appropriate to situation Eyes: Normal external eye and conjunctiva. HENT: Normocephalic, no lesions, without obvious abnormality.   Musculoskeletal-no joint tenderness, deformity or swelling Cardiovascular: Normal rate and regular rhythm.  Respiratory: Effort normal, non-labored breathing saturations WNL on RA GI: Soft.  No distension. There is no tenderness.  Skin: WDI, red  rash around entire mouth and on partial bilateral cheeks. Acne like dried lesions noted on face  as well   Neuro:  Mental Status: Alert, oriented to all except year (stated it was 2001). Thought content appropriate.  Speech fluent without evidence of aphasia.  Able to follow commands without difficulty. Cranial Nerves: II:  Visual fields grossly normal, PERRL III,IV, VI: ptosis not present, EOMI V,VII: smile symmetric, facial light touch sensation normal bilaterally VIII: hearing normal bilaterally IX,X: uvula rises symmetrically XI: bilateral shoulder shrug XII: midline tongue extension Motor: Right : Upper extremity   5/5  Left:     Upper extremity   5/5  Lower extremity   5/5   Lower extremity   5/5 Tone and bulk:normal tone throughout; no atrophy noted Sensory:  light touch intact throughout, bilaterally Cerebellar: Normal finger-to-nose Gait: deferred    Medications:  Scheduled: . LORazepam  0-4 mg Intravenous Q6H   Followed by  . [START ON 03/28/2019] LORazepam  0-4 mg Intravenous Q12H  . multivitamin with minerals  1 tablet Oral Daily  . multivitamin with minerals  1 tablet Oral  Daily  . sodium chloride flush  3 mL Intravenous Q12H  . thiamine  100 mg Oral Daily   Or  . thiamine  100 mg Intravenous Daily   Continuous: . sodium chloride Stopped (03/26/19 2242)  . levETIRAcetam 1,500 mg (03/27/19 0848)  . phenytoin (DILANTIN) IV 150 mg (03/27/19 0630)  . potassium chloride 10 mEq (03/27/19 0905)   DZH:GDJMEQ chloride, LORazepam **OR** LORazepam, nicotine, sodium chloride flush  Pertinent Labs/Diagnostics:  03/26/2019 Ammonia: 50 Phenytoin level < 2.5  03/26/2019 IMPRESSION: This study is suggestive of moderate to severe diffuse encephalopathy, nonspecific to etiology. The excessive beta activity seen in the background is most likely due to the effect of benzodiazepine and is a benign EEG pattern. No seizures or epileptiform discharges were seen throughout the recording.  CT Head Wo Contrast  Result Date: 03/26/2019 CLINICAL DATA:  Altered level of consciousness (LOC), unexplained; Polytrauma, critical, head/C-spine injury suspected Found on bathtub. Hematoma to left side of head. EXAM: CT HEAD WITHOUT CONTRAST CT CERVICAL SPINE WITHOUT CONTRAST TECHNIQUE: Multidetector CT imaging of the head and cervical spine was performed following the standard protocol without intravenous contrast. Multiplanar CT image reconstructions of the cervical spine were also generated. COMPARISON:  Head CT 09/06/2018. Head and cervical spine CT 02/24/2018 FINDINGS: CT HEAD FINDINGS Brain: No intracranial hemorrhage, mass effect, or midline shift. Previous intracranial hemorrhage has resolved. Minimal encephalomalacia in the anterior right frontal lobe at site of prior hemorrhage. Mild generalized atrophy for age. The basilar cisterns are patent. No  evidence of territorial infarct or acute ischemia. No extra-axial or intracranial fluid collection. Vascular: No hyperdense vessel. Skull: No fracture or focal lesion. Sinuses/Orbits: Partially obscured by motion. Grossly negative. Other: Large  left frontal scalp hematoma. CT CERVICAL SPINE FINDINGS Alignment: Normal. Skull base and vertebrae: No acute fracture. Cervical vertebral body heights are maintained. Slight loss of height of superior endplate of T1 and T2, appears chronic but new from 02/24/2018. The dens and skull base are intact. Soft tissues and spinal canal: No prevertebral fluid or swelling. No visible canal hematoma. Disc levels:  Preserved. Upper chest: Negative. Other: None. IMPRESSION: 1. Large left frontal scalp hematoma. No acute intracranial abnormality. No skull fracture. 2. No acute fracture or subluxation of the cervical spine. Electronically Signed   By: Narda Rutherford M.D.   On: 03/26/2019 04:42   CT Cervical Spine Wo Contrast Result Date: 03/26/2019 CLINICAL DATA:  Altered level of consciousness (LOC), unexplained; Polytrauma, critical, head/C-spine injury suspected Found on bathtub. Hematoma to left side of head. EXAM: CT HEAD WITHOUT CONTRAST CT CERVICAL SPINE WITHOUT CONTRAST TECHNIQUE: Multidetector CT imaging of the head and cervical spine was performed following the standard protocol without intravenous contrast. Multiplanar CT image reconstructions of the cervical spine were also generated. COMPARISON:  Head CT 09/06/2018. Head and cervical spine CT 02/24/2018 FINDINGS: CT HEAD FINDINGS Brain: No intracranial hemorrhage, mass effect, or midline shift. Previous intracranial hemorrhage has resolved. Minimal encephalomalacia in the anterior right frontal lobe at site of prior hemorrhage. Mild generalized atrophy for age. The basilar cisterns are patent. No evidence of territorial infarct or acute ischemia. No extra-axial or intracranial fluid collection. Vascular: No hyperdense vessel. Skull: No fracture or focal lesion. Sinuses/Orbits: Partially obscured by motion. Grossly negative. Other: Large left frontal scalp hematoma. CT CERVICAL SPINE FINDINGS Alignment: Normal. Skull base and vertebrae: No acute fracture.  Cervical vertebral body heights are maintained. Slight loss of height of superior endplate of T1 and T2, appears chronic but new from 02/24/2018. The dens and skull base are intact. Soft tissues and spinal canal: No prevertebral fluid or swelling. No visible canal hematoma. Disc levels:  Preserved. Upper chest: Negative. Other: None. IMPRESSION: 1. Large left frontal scalp hematoma. No acute intracranial abnormality. No skull fracture. 2. No acute fracture or subluxation of the cervical spine. Electronically Signed   By: Narda Rutherford M.D.   On: 03/26/2019 04:42   MR BRAIN WO CONTRAST Result Date: 03/26/2019 CLINICAL DATA:  Altered level of consciousness. Found in bathtub with hematoma on left side of head EXAM: MRI HEAD WITHOUT CONTRAST TECHNIQUE: Multiplanar, multiecho pulse sequences of the brain and surrounding structures were obtained without intravenous contrast. 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 This is presumably related to patient's subarachnoid hemorrhage seen on 2019 head CT. No evidence of acute infarct, acute hemorrhage, hydrocephalus, mass, or collection. There is diminished brain volume greater than expected for age. Small areas of encephalomalacia and gliosis are seen along the cortex/subcortical white matter of the high bifrontal lobes. History of seizure with asymmetric small left hippocampus. Specific hippocampus sequence was not performed and signal comparison is suboptimal. Vascular: Normal flow voids Skull and upper cervical spine: Normal marrow signal Sinuses/Orbits: Large left scalp hemorrhage with swelling Other: There may be asymmetric right tongue atrophy or edema, only clearly covered on coronal T2 weighted imaging. No lesion is seen along  the course of the hypoglossal nerve. IMPRESSION: 1. 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 There was sizable subarachnoid hemorrhage seen by CT in 2019. 3. Areas of cortical/subcortical gliosis in the high bifrontal region where there is chronic blood products, presumably posttraumatic. 4. Motion degraded. 5. Right tongue edema versus atrophy, please correlate with exam. Electronically Signed   By: Marnee Spring M.D.   On: 03/26/2019 10:57   DG Chest Port 1 View Result Date: 03/26/2019 CLINICAL DATA:  Found down in bathtub. Altered mental status. EXAM: PORTABLE CHEST 1 VIEW COMPARISON:  Radiograph 06/24/2018 FINDINGS: The cardiomediastinal contours are normal. The lungs are clear. Pulmonary vasculature is normal. No consolidation, pleural effusion, or pneumothorax. No acute osseous abnormalities are seen. IMPRESSION: Unremarkable single AP view of the chest. Electronically Signed   By: Narda Rutherford M.D.   On: 03/26/2019 03:23   DG Abd Portable 1V Result Date: 03/26/2019 CLINICAL DATA:  Pre MRI screening. EXAM: PORTABLE ABDOMEN - 1 VIEW COMPARISON:  None. FINDINGS: Bowel gas pattern is nonobstructive. No metallic foreign bodies are visualized. Minimal degenerative change of the hips. IMPRESSION: Nonobstructive bowel gas pattern. No metallic foreign bodies visualized. Electronically Signed   By: Elberta Fortis M.D.   On: 03/26/2019 07:55    US Abdomen Limited RUQ Result Date: 03/26/2019 CLINICAL DATA:  Abnormal liver function. Additional history provided by scanning technologist: TBI, history of alcohol abuse. EXAM: ULTRASOUND ABDOMEN LIMITED RIGHT UPPER QUADRANT COMPARISON:  No pertinent prior studies available for comparison. FINDINGS: Gallbladder: No gallstones or wall thickening visualized. No sonographic Murphy sign noted by sonographer. Common bile duct: Diameter: 3 mm, within normal limits. Liver: No focal lesion identified. Increased hepatic parenchymal echogenicity.Portal vein is patent on color Doppler imaging with  normal direction of blood flow towards the liver. IMPRESSION: Increased hepatic parenchymal echogenicity. This is a nonspecific finding, most commonly seen in the setting of hepatic steatosis. Otherwise unremarkable right upper quadrant ultrasound, as detailed. Electronically Signed   By: Jackey Loge DO   On: 03/26/2019 07:23   Impression: 38 year old female with a history of daily alcohol abuse and seizure disorder who was found confused in her bathtub with evidence of having hit her head. 1. DDx for etiology of her AMS includes postconcussive syndrome, status epilepticus (aborted with fosphenytoin and Ativan), seizure with postictal state and alcohol withdrawal.   2. Given her previous admission with similar presentation, we actually suspect that this was status epilepticus which has been broken.   3. Though she stated that she is compliant, we do not know if she is currently reliable. Primary team should re-discuss possible compliance issues with her as she continues to clear. 4. Given low magnesium of 1.3 today, hypomagnesemia is also a possible precipitant of what was most likely an unwitnessed seizure versus status epilepticus at home 5. EEG report conclusions: This study is suggestive of moderate to severe diffuse encephalopathy, nonspecific to etiology. The excessive beta activity seen in the background is most likely due to the effect of benzodiazepine and is a benign EEG pattern. No seizures or epileptiform discharges were seen throughout the recording.  Recommendations: -- Continue  phenytoin 150 mg TID -- Continue home Keppra 1500 mg BID -- f/u with outpatient neurology.  -- Pharmacy assistance appreciated regarding IV magnesium sulfate 2 g x 1 followed by repeat level in the AM -- Neurology to sign off at this time. Please call with any additional questions.   Valentina Lucks, MSN, NP-C [facility] [phone]  Electronically signed: Dr. Caryl PinaEric Brynne Doane 03/27/2019, 9:37  AM

## 2019-03-27 NOTE — Progress Notes (Signed)
Pharmacy Antibiotic Note  Alyssa Little is a 38 y.o. female admitted on 03/26/2019 with staph aureus bacteremia in 1/2 blood cultures.  Pharmacy has been consulted for Vancomycin dosing.WBC WNL and patient afebrile. Note patient has very low body mass with weight of 40.1 kg so Scr may be falsely low. Current SCr is 0.58 with CrCl ~ 60 ml/min.   Plan: Vancomycin 1000 mg X 1 then 750 mg every 24 hours Estimated AUC 481 with Scr 0.8  Monitor renal function, cultures and levels as appropriate  Weight: 88 lb 6.5 oz (40.1 kg)  Temp (24hrs), Avg:98.8 F (37.1 C), Min:98.2 F (36.8 C), Max:99.3 F (37.4 C)  Recent Labs  Lab 03/26/19 0210 03/26/19 0247 03/26/19 0651 03/26/19 1045 03/26/19 1700 03/27/19 0502  WBC  --  15.7*  --   --   --  7.4  CREATININE 0.73  --  0.67 0.43* 0.53 0.58  LATICACIDVEN  --   --   --  1.1  --   --     Estimated Creatinine Clearance: 59.8 mL/min (by C-G formula based on SCr of 0.58 mg/dL).    Allergies  Allergen Reactions  . Shellfish Allergy Anaphylaxis  . Penicillins Other (See Comments)    Unknown per family and friend  Has patient had a PCN reaction causing immediate rash, facial/tongue/throat swelling, SOB or lightheadedness with hypotension: Unknown Has patient had a PCN reaction causing severe rash involving mucus membranes or skin necrosis: Unknown Has patient had a PCN reaction that required hospitalization: Unknown Has patient had a PCN reaction occurring within the last 10 years: Unknown If all of the above answers are "NO", then may proceed with Cephalosporin use.   . Shellfish Allergy Other (See Comments)    Unknown per family and friend  . Penicillins Hives and Rash     Thank you for allowing pharmacy to be a part of this patient's care.  Jimmy Footman, PharmD, BCPS, BCIDP Infectious Diseases Clinical Pharmacist Phone: [phone] 03/27/2019 1:43 PM

## 2019-03-27 NOTE — Progress Notes (Signed)
  Echocardiogram 2D Echocardiogram has been performed.  Johny Chess 03/27/2019, 5:22 PM

## 2019-03-27 NOTE — Progress Notes (Signed)
CRITICAL VALUE ALERT  Critical Value:  Potassium 2.6  Date & Time Notied:  03/27/2019 at 0640  Provider Notified: on call with [facility] via Am[facility]   Orders Received/Actions taken: awaiting orders

## 2019-03-27 NOTE — Consult Note (Signed)
[facility] Infect[facility]    Date of Admission:  03/26/2019     Total days of antibiotics 0               Reason for Consult: Staph aureus bacteremia  Referring Provider: Champ/auto consult Primary Care Provider: Carlena Hurl, PA-C   ASSESSMENT:  Ms. Citron is a 38 year old female admitted with altered mental status suspected to be seizure related and found to have Staph aureus bacteremia.  Appears improved since admission with mild continued confusion.  Check TTE.  Repeat blood cultures tomorrow.  Source of infection remains unclear at present.  Elevated liver enzymes appear to be improving slowly.  Hypokalemic this morning with a potassium of 2.6 which appears to be repleated.   PLAN:  1. Continue vancomycin 2. Repeat blood cultures tomorrow. 3. TTE to rule out endocarditis. 4. Seizure management per neurology. 5. Electrolyte management per primary team.    Principal Problem:   Staphylococcus aureus bacteremia Active Problems:   Seizures (HCC)   ETOH abuse   Altered mental status   Hyperkalemia   Hyponatremia   Abnormal liver function   AMS (altered mental status)   . LORazepam  0-4 mg Intravenous Q6H   Followed by  . [START ON 03/28/2019] LORazepam  0-4 mg Intravenous Q12H  . multivitamin with minerals  1 tablet Oral Daily  . multivitamin with minerals  1 tablet Oral Daily  . sodium chloride flush  3 mL Intravenous Q12H  . thiamine  100 mg Oral Daily   Or  . thiamine  100 mg Intravenous Daily     HPI  Riham Polyakov is a 38 y.o. female with previous medical history of seizure disorder, alcohol abuse, and traumatic brain injury (02/2018) admitted with altered mental status after being found in her bathtub with water running and being incoherent and not responding.   On arrival with mildly elevated temperature of 99.7 F with tachycardia and tachypnea.  Hematoma on left forehead on exam.  Lab work remarkable for potassium 5.6, AST  277, ALT 119, alkaline phosphatase 147, bilirubin 3.1, phenytoin level less than 2.5, white blood cell count of 15.7, with absolute neutrophil count of 14.3.  Toxicology with no significant findings and salicylate level of less than 7.  Chest x-ray with no cardiopulmonary disease.  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  Neurology consulted with concern for possible status epilepticus resolved with Ativan/fosphenytoin and possibly postictal state.  Afebrile since admission with normal white blood cell count.  Blood cultures positive for Staph aureus and 2/4 bottles.  Started on vancomycin per pharmacy protocol.  Ms. Jankowiak does not recall the events prior leading to her hospitalization. She is slow to respond at times. Was previously seen in the hospital, however is unable to provide any additional information regarding this.    Review of Systems: Review of Systems  Constitutional: Negative for chills, fever and weight loss.  Respiratory: Negative for cough, shortness of breath and wheezing.   Cardiovascular: Negative for chest pain and leg swelling.  Gastrointestinal: Negative for abdominal pain, constipation, diarrhea, nausea and vomiting.  Skin: Negative for rash.     Past Medical History:  Diagnosis Date  . Alcohol abuse   . SAH (subarachnoid hemorrhage) ([facility])   . Seizure disorder (  HCC)   . TBI (traumatic brain injury) (HCC)     Social History   Tobacco Use  . Smoking status: Current Every Day Smoker    Packs/day: 0.25    Types: Cigarettes  . Smokeless tobacco: Never Used  Substance Use Topics  . Alcohol use: Yes    Alcohol/week: 1.0 standard drinks    Types: 1 Glasses of wine per week    Comment: 1 drink per day  . Drug use: Never     No family history on file.  Allergies  Allergen Reactions  . Shellfish Allergy Anaphylaxis  . Penicillins Other (See Comments)    Unknown per family and friend  Has patient had a PCN reaction causing immediate rash, facial/tongue/throat swelling, SOB or lightheadedness with hypotension: Unknown Has patient had a PCN reaction causing severe rash involving mucus membranes or skin necrosis: Unknown Has patient had a PCN reaction that required hospitalization: Unknown Has patient had a PCN reaction occurring within the last 10 years: Unknown If all of the above answers are "NO", then may proceed with Cephalosporin use.   . Shellfish Allergy Other (See Comments)    Unknown per family and friend  . Penicillins Hives and Rash    OBJECTIVE: Blood pressure 103/72, pulse 99, temperature 98.2 F (36.8 C), temperature source Oral, resp. rate 18, weight 40.1 kg, SpO2 99 %.  Physical Exam Constitutional:      General: She is not in acute distress.    Appearance: She is well-developed. She is ill-appearing.     Comments: Lying in bed resting; pleasant.   HENT:     Head:     Comments: Red facial rash located across face on bilateral sides.  Cardiovascular:     Rate and Rhythm: Normal rate and regular rhythm.     Heart sounds: Normal heart sounds.  Pulmonary:     Effort: Pulmonary effort is normal.     Breath sounds: Normal breath sounds.  Skin:    General: Skin is warm and dry.  Neurological:     Mental Status: She is alert.  Psychiatric:        Mood and Affect: Mood normal.     Lab Results Lab Results  Component Value Date   WBC 7.4 03/27/2019   HGB 10.9 (L) 03/27/2019   HCT 32.9 (L) 03/27/2019   MCV 109.7 (H) 03/27/2019   PLT 156 03/27/2019    Lab Results  Component Value Date   CREATININE 0.46 03/27/2019   BUN <5 (L) 03/27/2019   NA 135 03/27/2019   K 3.2 (L) 03/27/2019   CL 102 03/27/2019   CO2 23 03/27/2019    Lab Results  Component Value Date   ALT 77 (H)  03/27/2019   AST 148 (H) 03/27/2019   ALKPHOS 121 03/27/2019   BILITOT 1.6 (H) 03/27/2019     Microbiology: Recent Results (from the past 240 hour(s))  Culture, blood (Routine X 2) w Reflex to ID Panel     Status: Abnormal (Preliminary result)   Collection Time: 03/26/19 12:10 AM   Specimen: BLOOD LEFT HAND  Result Value Ref Range Status   Specimen Description BLOOD LEFT HAND  Final   Special Requests   Final    BOTTLES DRAWN AEROBIC AND ANAEROBIC Blood Culture adequate volume   Culture  Setup Time   Final    IN BOTH AEROBIC AND ANAEROBIC BOTTLES GRAM POSITIVE COCCI CRITICAL RESULT CALLED TO, READ BACK BY AND VERIFIED WITH: L CURRAN PHARMD 03/26/19 2001 JDW  Culture (A)  Final    STAPHYLOCOCCUS AUREUS SUSCEPTIBILITIES TO FOLLOW Performed at [facility], [address] [place], [place] [address]    Report Status PENDING  Incomplete  Culture, blood (Routine X 2) w Reflex to ID Panel     Status: None (Preliminary result)   Collection Time: 03/26/19 12:10 AM   Specimen: BLOOD RIGHT FOREARM  Result Value Ref Range Status   Specimen Description BLOOD RIGHT FOREARM  Final   Special Requests   Final    BOTTLES DRAWN AEROBIC AND ANAEROBIC Blood Culture adequate volume   Culture   Final    NO GROWTH 1 DAY Performed at [facility], [address] [place], [place] [address]    Report Status PENDING  Incomplete  SARS CORONAVIRUS 2 (TAT 6-24 HRS)     Status: None   Collection Time: 03/26/19  1:40 AM  Result Value Ref Range Status   SARS Coronavirus 2 NEGATIVE NEGATIVE Final    Comment: (NOTE) SARS-CoV-2 target nucleic acids are NOT DETECTED. The SARS-CoV-2 RNA is generally detectable in upper and lower respiratory specimens during the acute phase of infection. Negative results do not preclude SARS-CoV-2 infection, do not rule out co-infections with other pathogens, and should not be used as the sole basis for treatment or other patient management  decisions. Negative results must be combined with clinical observations, patient history, and epidemiological information. The expected result is Negative. Fact Sheet for Patients: HairSlick.no Fact Sheet for Healthcare Providers: quierodirigir.com This test is not yet approved or cleared by the [place] FDA and  has been authorized for detection and/or diagnosis of SARS-CoV-2 by FDA under an Emergency Use Authorization (EUA). This EUA will remain  in effect (meaning this test can be used) for the duration of the COVID-19 declaration under Section 56 4(b)(1) of the Act, 21 U.S.C. section 360bbb-3(b)(1), unless the authorization is terminated or revoked sooner. Performed at [facility], [address], [place], [place] [address]      Marcos Eke, NP [facility] [phone] Pager  03/27/2019  4:14 PM

## 2019-03-27 NOTE — Progress Notes (Signed)
[facility] PROGRESS NOTE  Jlee Harkless GBT:[id number] DOB: Aug 28, 1980 DOA: 03/26/2019 PCP: Carlena Hurl, PA-C  Brief Summary: HPI: Alyssa Little is a 38 y.o. female with a history of alcohol abuse and seizure disorder who was found tonight confused.  She apparently was found in her bathtub with water running.  She was incoherent and not responding.  She was brought into the emergency department where she was not following commands and was very confused and responding to unseen stimuli.  She was given Ativan loaded with fosphenytoin due to a subtherapeutic level.  Assessment/Plan: Active Problems:   Seizures (HCC)   ETOH abuse   Altered mental status   Hyperkalemia   Hyponatremia   Abnormal liver function   AMS (altered mental status)   Encephalopathy   Likely postictal state.  Mentation is clearing, but still altered  Hyperkalemia   S/p cal gluconate, kayexelate, sodium bicarb  Overcorrected. Currently replacing.   Recheck potassium this afternoon and tomorrow AM  Abnormal liver function   Acute hepatitis panel pending  RUQ shows fatty liver disease.   CMP this AM stable.   AG acidosis  Corrected  Leukocytosis  CBC in AM  Alcoholism  CIWA   Seizure do  Cont Keppra 1500mg  iv bid  Cont dilantin  Appreciate Neuro consult.  Code Status: full Family Communication: none Disposition Plan: patient should be able to return tomorrow.   Consultants:  neuro  Procedures:  Antibiotics:  HPI/Subjective: Patient without complaint this morning, except for head pain.  Is hungry and desires to eat.  Objective: Vitals:   03/27/19 0551 03/27/19 0818  BP: 101/72 103/72  Pulse: (!) 102 99  Resp: 17 18  Temp: 98.6 F (37 C) 98.2 F (36.8 C)  SpO2: 100% 99%    Intake/Output Summary (Last 24 hours) at 03/27/2019 1431 Last data filed at 03/27/2019 0300 Gross per 24 hour  Intake 709.44 ml  Output --  Net 709.44 ml    Filed Weights   03/26/19 0441 03/27/19 0551  Weight: 45.4 kg 40.1 kg    Exam:   General: Awake and alert.  Still somewhat confused as to where she is, but oriented to person.  Cardiovascular: Regular rate  Respiratory: Clear to auscultation bilaterally  Abdomen: No tenderness  Musculoskeletal: No contractures.  Extremity: No edema. warm extremities. pulses 2+  Data Reviewed: Basic Metabolic Panel: Recent Labs  Lab 03/26/19 0210 03/26/19 0651 03/26/19 1045 03/26/19 1700 03/27/19 0502  NA 134* 136 140 139 141  K 5.6* 4.3 3.0* 3.0* 2.6*  CL 98 99 105 101 103  CO2 13* 15* 24 26 24   GLUCOSE 127* 124* 94 89 75  BUN 5* <5* <5* <5* <5*  CREATININE 0.73 0.67 0.43* 0.53 0.58  CALCIUM 8.3* 8.6* 8.2* 8.7* 8.9  MG  --  2.3  --   --   --    Liver Function Tests: Recent Labs  Lab 03/26/19 0210 03/26/19 0651 03/27/19 0502  AST 277* 209* 148*  ALT 119* 107* 77*  ALKPHOS 147* 141* 121  BILITOT 3.1* 2.0* 1.6*  PROT 7.0 7.1 6.3*  ALBUMIN 3.9 3.7 3.2*   Recent Labs  Lab 03/26/19 0210  LIPASE 23   Recent Labs  Lab 03/26/19 0651  AMMONIA 50*   CBC: Recent Labs  Lab 03/26/19 0247 03/27/19 0502  WBC 15.7* 7.4  NEUTROABS 14.3*  --   HGB 12.0 10.9*  HCT 35.2* 32.9*  MCV 107.3* 109.7*  PLT 199 156   Cardiac  Enzymes: Recent Labs  Lab 03/26/19 0651  CKTOTAL 738*  CKMB 10.4*   BNP (last 3 results) No results for input(s): BNP in the last 8760 hours.  ProBNP (last 3 results) No results for input(s): PROBNP in the last 8760 hours.  CBG: No results for input(s): GLUCAP in the last 168 hours.  Recent Results (from the past 240 hour(s))  Culture, blood (Routine X 2) w Reflex to ID Panel     Status: Abnormal (Preliminary result)   Collection Time: 03/26/19 12:10 AM   Specimen: BLOOD LEFT HAND  Result Value Ref Range Status   Specimen Description BLOOD LEFT HAND  Final   Special Requests   Final    BOTTLES DRAWN AEROBIC AND ANAEROBIC Blood Culture  adequate volume   Culture  Setup Time   Final    IN BOTH AEROBIC AND ANAEROBIC BOTTLES GRAM POSITIVE COCCI CRITICAL RESULT CALLED TO, READ BACK BY AND VERIFIED WITH: L CURRAN PHARMD 03/26/19 2001 JDW    Culture (A)  Final    STAPHYLOCOCCUS AUREUS SUSCEPTIBILITIES TO FOLLOW Performed at [facility], [address], [place], [place] [address]    Report Status PENDING  Incomplete  Culture, blood (Routine X 2) w Reflex to ID Panel     Status: None (Preliminary result)   Collection Time: 03/26/19 12:10 AM   Specimen: BLOOD RIGHT FOREARM  Result Value Ref Range Status   Specimen Description BLOOD RIGHT FOREARM  Final   Special Requests   Final    BOTTLES DRAWN AEROBIC AND ANAEROBIC Blood Culture adequate volume   Culture   Final    NO GROWTH 1 DAY Performed at [facility], [address] [place], [place] [address]    Report Status PENDING  Incomplete  SARS CORONAVIRUS 2 (TAT 6-24 HRS)     Status: None   Collection Time: 03/26/19  1:40 AM  Result Value Ref Range Status   SARS Coronavirus 2 NEGATIVE NEGATIVE Final    Comment: (NOTE) SARS-CoV-2 target nucleic acids are NOT DETECTED. The SARS-CoV-2 RNA is generally detectable in upper and lower respiratory specimens during the acute phase of infection. Negative results do not preclude SARS-CoV-2 infection, do not rule out co-infections with other pathogens, and should not be used as the sole basis for treatment or other patient management decisions. Negative results must be combined with clinical observations, patient history, and epidemiological information. The expected result is Negative. Fact Sheet for Patients: HairSlick.no Fact Sheet for Healthcare Providers: quierodirigir.com This test is not yet approved or cleared by the [place] FDA and  has been authorized for detection and/or diagnosis of SARS-CoV-2 by FDA under an Emergency Use Authorization (EUA).  This EUA will remain  in effect (meaning this test can be used) for the duration of the COVID-19 declaration under Section 56 4(b)(1) of the Act, 21 U.S.C. section 360bbb-3(b)(1), unless the authorization is terminated or revoked sooner. Performed at [facility], [address] [place], [place] [address]      Studies: CT Head Wo Contrast  Result Date: 03/26/2019 CLINICAL DATA:  Altered level of consciousness (LOC), unexplained; Polytrauma, critical, head/C-spine injury suspected Found on bathtub. Hematoma to left side of head. EXAM: CT HEAD WITHOUT CONTRAST CT CERVICAL SPINE WITHOUT CONTRAST TECHNIQUE: Multidetector CT imaging of the head and cervical spine was performed following the standard protocol without intravenous contrast. Multiplanar CT image reconstructions of the cervical spine were also generated. COMPARISON:  Head CT 09/06/2018. Head and cervical spine  CT 02/24/2018 FINDINGS: CT HEAD FINDINGS Brain: No intracranial hemorrhage, mass effect, or midline shift. Previous intracranial hemorrhage has resolved. Minimal encephalomalacia in the anterior right frontal lobe at site of prior hemorrhage. Mild generalized atrophy for age. The basilar cisterns are patent. No evidence of territorial infarct or acute ischemia. No extra-axial or intracranial fluid collection. Vascular: No hyperdense vessel. Skull: No fracture or focal lesion. Sinuses/Orbits: Partially obscured by motion. Grossly negative. Other: Large left frontal scalp hematoma. CT CERVICAL SPINE FINDINGS Alignment: Normal. Skull base and vertebrae: No acute fracture. Cervical vertebral body heights are maintained. Slight loss of height of superior endplate of T1 and T2, appears chronic but new from 02/24/2018. The dens and skull base are intact. Soft tissues and spinal canal: No prevertebral fluid or swelling. No visible canal hematoma. Disc levels:  Preserved. Upper chest: Negative. Other: None. IMPRESSION: 1. Large left frontal  scalp hematoma. No acute intracranial abnormality. No skull fracture. 2. No acute fracture or subluxation of the cervical spine. Electronically Signed   By: Narda Rutherford M.D.   On: 03/26/2019 04:42   CT Cervical Spine Wo Contrast  Result Date: 03/26/2019 CLINICAL DATA:  Altered level of consciousness (LOC), unexplained; Polytrauma, critical, head/C-spine injury suspected Found on bathtub. Hematoma to left side of head. EXAM: CT HEAD WITHOUT CONTRAST CT CERVICAL SPINE WITHOUT CONTRAST TECHNIQUE: Multidetector CT imaging of the head and cervical spine was performed following the standard protocol without intravenous contrast. Multiplanar CT image reconstructions of the cervical spine were also generated. COMPARISON:  Head CT 09/06/2018. Head and cervical spine CT 02/24/2018 FINDINGS: CT HEAD FINDINGS Brain: No intracranial hemorrhage, mass effect, or midline shift. Previous intracranial hemorrhage has resolved. Minimal encephalomalacia in the anterior right frontal lobe at site of prior hemorrhage. Mild generalized atrophy for age. The basilar cisterns are patent. No evidence of territorial infarct or acute ischemia. No extra-axial or intracranial fluid collection. Vascular: No hyperdense vessel. Skull: No fracture or focal lesion. Sinuses/Orbits: Partially obscured by motion. Grossly negative. Other: Large left frontal scalp hematoma. CT CERVICAL SPINE FINDINGS Alignment: Normal. Skull base and vertebrae: No acute fracture. Cervical vertebral body heights are maintained. Slight loss of height of superior endplate of T1 and T2, appears chronic but new from 02/24/2018. The dens and skull base are intact. Soft tissues and spinal canal: No prevertebral fluid or swelling. No visible canal hematoma. Disc levels:  Preserved. Upper chest: Negative. Other: None. IMPRESSION: 1. Large left frontal scalp hematoma. No acute intracranial abnormality. No skull fracture. 2. No acute fracture or subluxation of the cervical  spine. Electronically Signed   By: Narda Rutherford M.D.   On: 03/26/2019 04:42   MR BRAIN WO CONTRAST  Result Date: 03/26/2019 CLINICAL DATA:  Altered level of consciousness. Found in bathtub with hematoma on left side of head EXAM: MRI HEAD WITHOUT CONTRAST TECHNIQUE: Multiplanar, multiecho pulse sequences of the brain and surrounding structures were obtained without intravenous contrast. 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 This is presumably related to patient's subarachnoid hemorrhage seen on 2019 head CT. No evidence of acute infarct, acute hemorrhage, hydrocephalus, mass, or collection. There is diminished brain volume greater than expected for age. Small areas of encephalomalacia and gliosis are seen along the cortex/subcortical white matter of the high bifrontal lobes. History of seizure with asymmetric small left hippocampus. Specific hippocampus sequence was not performed and  signal comparison is suboptimal. Vascular: Normal flow voids Skull and upper cervical spine: Normal marrow signal Sinuses/Orbits: Large left scalp hemorrhage with swelling Other: There may be asymmetric right tongue atrophy or edema, only clearly covered on coronal T2 weighted imaging. No lesion is seen along the course of the hypoglossal nerve. IMPRESSION: 1. 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 There was sizable subarachnoid hemorrhage seen by CT in 2019. 3. Areas of cortical/subcortical gliosis in the high bifrontal region where there is chronic blood products, presumably posttraumatic. 4. Motion degraded. 5. Right tongue edema versus atrophy, please correlate with exam. Electronically Signed   By: Marnee SpringJonathon  Watts M.D.   On: 03/26/2019 10:57   DG Chest Port 1 View  Result Date: 03/26/2019 CLINICAL DATA:  Found  down in bathtub. Altered mental status. EXAM: PORTABLE CHEST 1 VIEW COMPARISON:  Radiograph 06/24/2018 FINDINGS: The cardiomediastinal contours are normal. The lungs are clear. Pulmonary vasculature is normal. No consolidation, pleural effusion, or pneumothorax. No acute osseous abnormalities are seen. IMPRESSION: Unremarkable single AP view of the chest. Electronically Signed   By: Narda RutherfordMelanie  Sanford M.D.   On: 03/26/2019 03:23   DG Abd Portable 1V  Result Date: 03/26/2019 CLINICAL DATA:  Pre MRI screening. EXAM: PORTABLE ABDOMEN - 1 VIEW COMPARISON:  None. FINDINGS: Bowel gas pattern is nonobstructive. No metallic foreign bodies are visualized. Minimal degenerative change of the hips. IMPRESSION: Nonobstructive bowel gas pattern. No metallic foreign bodies visualized. Electronically Signed   By: Elberta Fortisaniel  Boyle M.D.   On: 03/26/2019 07:55   EEG adult  Result Date: 03/26/2019 Charlsie QuestYadav, Priyanka O, MD     03/26/2019  7:03 PM Patient Name: Warren LacyChristina Danielle Laurie MRN: [id number] Epilepsy Attending: Charlsie QuestPriyanka O Yadav Referring Physician/Provider: Dr. Onalee HuaMcNeil Kirkpatrick Date: 03/26/2019 Duration: 23.22 minutes Patient history: 38 year old female history of seizures who presented with altered mental status.  EEG to evaluate for seizures. Level of alertness: Awake/lethargic AEDs during EEG study: Lorazepam, Keppra, phenytoin Technical aspects: This EEG study was done with scalp electrodes positioned according to the 10-20 International system of electrode placement. Electrical activity was acquired at a sampling rate of 500Hz  and reviewed with a high frequency filter of 70Hz  and a low frequency filter of 1Hz . EEG data were recorded continuously and digitally stored. Description: During awake state, no clear posterior dominant rhythm was seen.  EEG showed continuous generalized 2 to 5 Hz theta-delta slowing with an overriding 15 to 18 Hz, 2-3 uV beta activity with irregular morphology distributed symmetrically and  diffusely. Hyperventilation and photic stimulation were not performed. Abnormality -Continuous slow, generalized -Excessive beta, generalized IMPRESSION: This study is suggestive of moderate to severe diffuse encephalopathy, nonspecific to etiology. The excessive beta activity seen in the background is most likely due to the effect of benzodiazepine and is a benign EEG pattern. No seizures or epileptiform discharges were seen throughout the recording. Charlsie QuestPriyanka O Yadav   US Abdomen Limited RUQ  Result Date: 03/26/2019 CLINICAL DATA:  Abnormal liver function. Additional history provided by scanning technologist: TBI, history of alcohol abuse. EXAM: ULTRASOUND ABDOMEN LIMITED RIGHT UPPER QUADRANT COMPARISON:  No pertinent prior studies available for comparison. FINDINGS: Gallbladder: No gallstones or wall thickening visualized. No sonographic Murphy sign noted by sonographer. Common bile duct: Diameter: 3 mm, within normal limits. Liver: No focal lesion identified. Increased hepatic parenchymal echogenicity.Portal vein is patent on color Doppler imaging with normal direction of blood flow towards the liver. IMPRESSION: Increased hepatic parenchymal echogenicity. This is a nonspecific finding, most commonly  seen in the setting of hepatic steatosis. Otherwise unremarkable right upper quadrant ultrasound, as detailed. Electronically Signed   By: Jackey Loge DO   On: 03/26/2019 07:23    Scheduled Meds: . LORazepam  0-4 mg Intravenous Q6H   Followed by  . [START ON 03/28/2019] LORazepam  0-4 mg Intravenous Q12H  . multivitamin with minerals  1 tablet Oral Daily  . multivitamin with minerals  1 tablet Oral Daily  . sodium chloride flush  3 mL Intravenous Q12H  . thiamine  100 mg Oral Daily   Or  . thiamine  100 mg Intravenous Daily   Continuous Infusions: . sodium chloride Stopped (03/26/19 2242)  . levETIRAcetam 1,500 mg (03/27/19 0848)  . phenytoin (DILANTIN) IV 150 mg (03/27/19 0630)  . vancomycin     . [START ON 03/28/2019] vancomycin        Time spent: 25    Levie Heritage   03/27/2019, 2:31 PM  LOS: 0 days

## 2019-03-28 LAB — BASIC METABOLIC PANEL
Anion gap: 11 (ref 5–15)
BUN: <5 mg/dL — ABNORMAL LOW (ref 6–20)
CO2: 25 mmol/L (ref 22–32)
Calcium: 8.6 mg/dL — ABNORMAL LOW (ref 8.9–10.3)
Chloride: 102 mmol/L (ref 98–111)
Creatinine, Ser: 0.39 mg/dL — ABNORMAL LOW (ref 0.44–1.00)
GFR calc Af Amer: >60 mL/min (ref >60)
GFR calc non Af Amer: >60 mL/min (ref >60)
Glucose, Bld: 88 mg/dL (ref 70–99)
Potassium: 3.2 mmol/L — ABNORMAL LOW (ref 3.5–5.1)
Sodium: 138 mmol/L (ref 135–145)

## 2019-03-28 LAB — MAGNESIUM: Magnesium: 1.8 mg/dL (ref 1.7–2.4)

## 2019-03-28 MED ORDER — POTASSIUM CHLORIDE CRYS ER 20 MEQ PO TBCR
40.0000 meq | EXTENDED_RELEASE_TABLET | Freq: Two times a day (BID) | ORAL | Status: AC
  Administered 2019-03-28 – 2019-03-29 (×4): 40 meq via ORAL
  Filled 2019-03-28 (×4): qty 2

## 2019-03-28 MED ORDER — CEFAZOLIN SODIUM-DEXTROSE 2-4 GM/100ML-% IV SOLN
2.0000 g | Freq: Three times a day (TID) | INTRAVENOUS | Status: DC
  Administered 2019-03-28 – 2019-03-30 (×6): 2 g via INTRAVENOUS
  Filled 2019-03-28 (×7): qty 100

## 2019-03-28 NOTE — Progress Notes (Signed)
[facility] PROGRESS NOTE  Alyssa LacyChristina Danielle Little BJY:[id number] DOB: 02/27/1981 DOA: 03/26/2019 PCP: Jac Canavanysinger, David S, PA-C  Brief Summary: HPI: Alyssa Little is a 38 y.o. female with a history of alcohol abuse and seizure disorder who was found tonight confused.  She apparently was found in her bathtub with water running.  She was incoherent and not responding.  She was brought into the emergency department where she was not following commands and was very confused and responding to unseen stimuli.  She was given Ativan loaded with fosphenytoin due to a subtherapeutic level.  Assessment/Plan: Principal Problem:   Staphylococcus aureus bacteremia Active Problems:   Seizures (HCC)   ETOH abuse   Altered mental status   Hyperkalemia   Hyponatremia   Abnormal liver function   AMS (altered mental status)   MSSA Bacteremia  1 out of 2 Bl Cx + for MSSA  On ancef  Echo pending.  Encephalopathy   Likely postictal state in addition to possible MSSA bacteremia  Mentation normal now  Hyperkalemia   S/p cal gluconate, kayexelate, sodium bicarb  Overcorrected. Currently replacing.   Recheck potassium this afternoon and tomorrow AM  Abnormal liver function   Acute hepatitis panel neg  HIV Neg  RUQ shows fatty liver disease.   CMP this AM stable.  Continue to monitor   AG acidosis  Corrected  Leukocytosis  CBC in AM  Alcoholism  CIWA   Seizure do  Cont Keppra 1500mg  iv bid  Cont dilantin  Appreciate Neuro consult.  Code Status: full Family Communication: none Disposition Plan: patient should be able to return tomorrow.   Consultants:  neuro  Procedures:  Antibiotics:  HPI/Subjective: Patient doing well.  No fevers, chills, nausea, vomiting.  Objective: Vitals:   03/28/19 0330 03/28/19 0924  BP: (!) 116/91 108/81  Pulse: 94   Resp: 20 16  Temp: 98.6 F (37 C)   SpO2: 100%     Intake/Output Summary (Last 24  hours) at 03/28/2019 1222 Last data filed at 03/28/2019 1100 Gross per 24 hour  Intake 1042 ml  Output --  Net 1042 ml   Filed Weights   03/26/19 0441 03/27/19 0551 03/28/19 0330  Weight: 45.4 kg 40.1 kg 40.7 kg    Exam:   General: Awake and alert.  Still somewhat confused as to where she is, but oriented to person.  Cardiovascular: Regular rate  Respiratory: Clear to auscultation bilaterally  Abdomen: No tenderness  Skin: Bruising on forehead, and around eyes.  Musculoskeletal: No contractures.  Extremity: No edema. warm extremities. pulses 2+  Data Reviewed: Basic Metabolic Panel: Recent Labs  Lab 03/26/19 0651 03/26/19 1045 03/26/19 1700 03/27/19 0502 03/27/19 1434 03/28/19 0324  NA 136 140 139 141 135 138  K 4.3 3.0* 3.0* 2.6* 3.2* 3.2*  CL 99 105 101 103 102 102  CO2 15* 24 26 24 23 25   GLUCOSE 124* 94 89 75 80 88  BUN <5* <5* <5* <5* <5* <5*  CREATININE 0.67 0.43* 0.53 0.58 0.46 0.39*  CALCIUM 8.6* 8.2* 8.7* 8.9 8.3* 8.6*  MG 2.3  --   --   --  1.3* 1.8   Liver Function Tests: Recent Labs  Lab 03/26/19 0210 03/26/19 0651 03/27/19 0502  AST 277* 209* 148*  ALT 119* 107* 77*  ALKPHOS 147* 141* 121  BILITOT 3.1* 2.0* 1.6*  PROT 7.0 7.1 6.3*  ALBUMIN 3.9 3.7 3.2*   Recent Labs  Lab 03/26/19 0210  LIPASE 23   Recent Labs  Lab 03/26/19 0651  AMMONIA 50*   CBC: Recent Labs  Lab 03/26/19 0247 03/27/19 0502  WBC 15.7* 7.4  NEUTROABS 14.3*  --   HGB 12.0 10.9*  HCT 35.2* 32.9*  MCV 107.3* 109.7*  PLT 199 156   Cardiac Enzymes: Recent Labs  Lab 03/26/19 0651  CKTOTAL 738*  CKMB 10.4*   BNP (last 3 results) No results for input(s): BNP in the last 8760 hours.  ProBNP (last 3 results) No results for input(s): PROBNP in the last 8760 hours.  CBG: No results for input(s): GLUCAP in the last 168 hours.  Recent Results (from the past 240 hour(s))  Culture, blood (Routine X 2) w Reflex to ID Panel     Status: Abnormal  (Preliminary result)   Collection Time: 03/26/19 12:10 AM   Specimen: BLOOD LEFT HAND  Result Value Ref Range Status   Specimen Description BLOOD LEFT HAND  Final   Special Requests   Final    BOTTLES DRAWN AEROBIC AND ANAEROBIC Blood Culture adequate volume   Culture  Setup Time   Final    IN BOTH AEROBIC AND ANAEROBIC BOTTLES GRAM POSITIVE COCCI CRITICAL RESULT CALLED TO, READ BACK BY AND VERIFIED WITH: L CURRAN PHARMD 03/26/19 2001 JDW    Culture (A)  Final    STAPHYLOCOCCUS AUREUS STREPTOCOCCUS MITIS/ORALIS SUSCEPTIBILITIES TO FOLLOW Performed at [facility], [address], [place], [place] [address]    Report Status PENDING  Incomplete   Organism ID, Bacteria STAPHYLOCOCCUS AUREUS  Final      Susceptibility   Staphylococcus aureus - MIC*    CIPROFLOXACIN <=0.5 SENSITIVE Sensitive     ERYTHROMYCIN RESISTANT Resistant     GENTAMICIN <=0.5 SENSITIVE Sensitive     OXACILLIN 0.5 SENSITIVE Sensitive     TETRACYCLINE <=1 SENSITIVE Sensitive     VANCOMYCIN <=0.5 SENSITIVE Sensitive     TRIMETH/SULFA <=10 SENSITIVE Sensitive     CLINDAMYCIN RESISTANT Resistant     RIFAMPIN <=0.5 SENSITIVE Sensitive     Inducible Clindamycin POSITIVE Resistant     * STAPHYLOCOCCUS AUREUS  Culture, blood (Routine X 2) w Reflex to ID Panel     Status: None (Preliminary result)   Collection Time: 03/26/19 12:10 AM   Specimen: BLOOD RIGHT FOREARM  Result Value Ref Range Status   Specimen Description BLOOD RIGHT FOREARM  Final   Special Requests   Final    BOTTLES DRAWN AEROBIC AND ANAEROBIC Blood Culture adequate volume   Culture   Final    NO GROWTH 2 DAYS Performed at [facility], [address], [place], [place] [address]    Report Status PENDING  Incomplete  SARS CORONAVIRUS 2 (TAT 6-24 HRS)     Status: None   Collection Time: 03/26/19  1:40 AM  Result Value Ref Range Status   SARS Coronavirus 2 NEGATIVE NEGATIVE Final    Comment: (NOTE) SARS-CoV-2 target nucleic acids are  NOT DETECTED. The SARS-CoV-2 RNA is generally detectable in upper and lower respiratory specimens during the acute phase of infection. Negative results do not preclude SARS-CoV-2 infection, do not rule out co-infections with other pathogens, and should not be used as the sole basis for treatment or other patient management decisions. Negative results must be combined with clinical observations, patient history, and epidemiological information. The expected result is Negative. Fact Sheet for Patients: HairSlick.no Fact Sheet for Healthcare Providers: quierodirigir.com This test is not yet approved or cleared by the [place] FDA and  has been  authorized for detection and/or diagnosis of SARS-CoV-2 by FDA under an Emergency Use Authorization (EUA). This EUA will remain  in effect (meaning this test can be used) for the duration of the COVID-19 declaration under Section 56 4(b)(1) of the Act, 21 U.S.C. section 360bbb-3(b)(1), unless the authorization is terminated or revoked sooner. Performed at [facility], [address] [place], [place] [address]      Studies: EEG adult  Result Date: Apr 15, 2019 Lora Havens, MD     04-15-19  7:03 PM Patient Name: Darnesha Diloreto MRN: [id number] Epilepsy Attending: Lora Havens Referring Physician/Provider: Dr. Kathrynn Speed Date: Apr 15, 2019 Duration: 23.22 minutes Patient history: 38 year old female history of seizures who presented with altered mental status.  EEG to evaluate for seizures. Level of alertness: Awake/lethargic AEDs during EEG study: Lorazepam, Keppra, phenytoin Technical aspects: This EEG study was done with scalp electrodes positioned according to the 10-20 International system of electrode placement. Electrical activity was acquired at a sampling rate of 500Hz  and reviewed with a high frequency filter of 70Hz  and a low frequency filter of 1Hz . EEG  data were recorded continuously and digitally stored. Description: During awake state, no clear posterior dominant rhythm was seen.  EEG showed continuous generalized 2 to 5 Hz theta-delta slowing with an overriding 15 to 18 Hz, 2-3 uV beta activity with irregular morphology distributed symmetrically and diffusely. Hyperventilation and photic stimulation were not performed. Abnormality -Continuous slow, generalized -Excessive beta, generalized IMPRESSION: This study is suggestive of moderate to severe diffuse encephalopathy, nonspecific to etiology. The excessive beta activity seen in the background is most likely due to the effect of benzodiazepine and is a benign EEG pattern. No seizures or epileptiform discharges were seen throughout the recording. Priyanka Barbra Sarks    Scheduled Meds:  LORazepam  0-4 mg Intravenous Q12H   multivitamin with minerals  1 tablet Oral Daily   multivitamin with minerals  1 tablet Oral Daily   sodium chloride flush  3 mL Intravenous Q12H   thiamine  100 mg Oral Daily   Or   thiamine  100 mg Intravenous Daily   Continuous Infusions:  sodium chloride Stopped (04-15-2019 2242)    ceFAZolin (ANCEF) IV 2 g (03/28/19 1039)   levETIRAcetam 1,500 mg (03/28/19 0900)   phenytoin (DILANTIN) IV 150 mg (03/28/19 0610)      Time spent: [place]   03/28/2019, 12:22 PM  LOS: 1 day

## 2019-03-28 NOTE — Progress Notes (Signed)
Pharmacy Antibiotic Note  Alyssa Little is a 38 y.o. female admitted on 03/26/2019 with staph aureus bacteremia bacteremia.  Pharmacy has been consulted for cefazolin dosing. Blood cultures from 12/10 have grown MSSA and streptococcus mitis. Repeat blood cultures have been sent. Patient has an allergy to penicillins reported as hives and rash in 2014 but as unknown in 2019. Of note, patient has received ceftriaxone in 06/2018 as well as Zosyn in 02/2018 with no reactions noted, so she is likely to tolerate cefazolin. I paged Dr. Tommy Medal who agrees, so will proceed with cefazolin therapy   Her creatinine is low at 0.39 (likely falsely low due to her low weight of 40.7 kg) with an estimated creatine clearance of ~60 ml/min. She is afebrile today at 98.6, but Tmax of last 24 hours is 99.1. WBC are wnl at 7.4.   Plan: Vancomycin discontinued (last dose 12/11 at 1530) Cefazolin 2 grams IV every 8 hours  Monitor renal function and cultures and clinical improvement  Follow up TEE/ IE work up for length of therapy   Weight: 89 lb 11.2 oz (40.7 kg)  Temp (24hrs), Avg:98.9 F (37.2 C), Min:98.6 F (37 C), Max:99.1 F (37.3 C)  Recent Labs  Lab 03/26/19 0247 03/26/19 1045 03/26/19 1700 03/27/19 0502 03/27/19 1434 03/28/19 0324  WBC 15.7*  --   --  7.4  --   --   CREATININE  --  0.43* 0.53 0.58 0.46 0.39*  LATICACIDVEN  --  1.1  --   --   --   --     Estimated Creatinine Clearance: 60.7 mL/min (A) (by C-G formula based on SCr of 0.39 mg/dL (L)).    Allergies  Allergen Reactions  . Shellfish Allergy Anaphylaxis  . Penicillins Other (See Comments)    Unknown per family and friend  Has patient had a PCN reaction causing immediate rash, facial/tongue/throat swelling, SOB or lightheadedness with hypotension: Unknown Has patient had a PCN reaction causing severe rash involving mucus membranes or skin necrosis: Unknown Has patient had a PCN reaction that required hospitalization:  Unknown Has patient had a PCN reaction occurring within the last 10 years: Unknown If all of the above answers are "NO", then may proceed with Cephalosporin use.   . Shellfish Allergy Other (See Comments)    Unknown per family and friend  . Penicillins Hives and Rash    Thank you,   Eddie Candle, PharmD PGY-1 Pharmacy Resident   Please check amion for clinical pharmacist contact number

## 2019-03-29 LAB — CULTURE, BLOOD (ROUTINE X 2): Special Requests: ADEQUATE

## 2019-03-29 LAB — COMPREHENSIVE METABOLIC PANEL
ALT: 123 U/L — ABNORMAL HIGH (ref 0–44)
AST: 249 U/L — ABNORMAL HIGH (ref 15–41)
Albumin: 3 g/dL — ABNORMAL LOW (ref 3.5–5.0)
Alkaline Phosphatase: 116 U/L (ref 38–126)
Anion gap: 9 (ref 5–15)
BUN: <5 mg/dL — ABNORMAL LOW (ref 6–20)
CO2: 23 mmol/L (ref 22–32)
Calcium: 8.8 mg/dL — ABNORMAL LOW (ref 8.9–10.3)
Chloride: 108 mmol/L (ref 98–111)
Creatinine, Ser: 0.4 mg/dL — ABNORMAL LOW (ref 0.44–1.00)
GFR calc Af Amer: >60 mL/min (ref >60)
GFR calc non Af Amer: >60 mL/min (ref >60)
Glucose, Bld: 84 mg/dL (ref 70–99)
Potassium: 3.8 mmol/L (ref 3.5–5.1)
Sodium: 140 mmol/L (ref 135–145)
Total Bilirubin: 1 mg/dL (ref 0.3–1.2)
Total Protein: 5.8 g/dL — ABNORMAL LOW (ref 6.5–8.1)

## 2019-03-29 LAB — CBC
HCT: 31.9 % — ABNORMAL LOW (ref 36.0–46.0)
Hemoglobin: 10.6 g/dL — ABNORMAL LOW (ref 12.0–15.0)
MCH: 36.3 pg — ABNORMAL HIGH (ref 26.0–34.0)
MCHC: 33.2 g/dL (ref 30.0–36.0)
MCV: 109.2 fL — ABNORMAL HIGH (ref 80.0–100.0)
Platelets: 147 10*3/uL — ABNORMAL LOW (ref 150–400)
RBC: 2.92 MIL/uL — ABNORMAL LOW (ref 3.87–5.11)
RDW: 13.2 % (ref 11.5–15.5)
WBC: 5.1 10*3/uL (ref 4.0–10.5)
nRBC: 0 % (ref 0.0–0.2)

## 2019-03-29 LAB — ECHOCARDIOGRAM COMPLETE: Weight: 1414.47 oz

## 2019-03-29 MED ORDER — PHENYTOIN 125 MG/5ML PO SUSP
150.0000 mg | ORAL | Status: DC
  Administered 2019-03-29 – 2019-03-31 (×6): 150 mg via ORAL
  Filled 2019-03-29 (×7): qty 8

## 2019-03-29 MED ORDER — LEVETIRACETAM 500 MG PO TABS
1500.0000 mg | ORAL_TABLET | Freq: Two times a day (BID) | ORAL | Status: DC
  Administered 2019-03-29 – 2019-03-31 (×4): 1500 mg via ORAL
  Filled 2019-03-29 (×4): qty 3

## 2019-03-29 NOTE — Progress Notes (Addendum)
[facility] PROGRESS NOTE  Alyssa Little ZOX:[id number] DOB: 11-13-1980 DOA: 03/26/2019 PCP: Jac Canavan, PA-C  Brief Summary: HPI: Alyssa Little is a 38 y.o. female with a history of alcohol abuse and seizure disorder who was found tonight confused.  She apparently was found in her bathtub with water running.  She was incoherent and not responding.  She was brought into the emergency department where she was not following commands and was very confused and responding to unseen stimuli.  She was given Ativan loaded with fosphenytoin due to a subtherapeutic level.  Assessment/Plan: Principal Problem:   Staphylococcus aureus bacteremia Active Problems:   Seizures (HCC)   ETOH abuse   Altered mental status   Hyperkalemia   Hyponatremia   Abnormal liver function   AMS (altered mental status)   MSSA Bacteremia  1 out of 2 Bl Cx + for MSSA  On ancef  TTE normal - discussed with cardiology: good views of leaflets without vegetation.  Rpt Blood cultures pending.  Encephalopathy   Likely postictal state in addition to possible MSSA bacteremia  Mentation normal now  Hyperkalemia   S/p cal gluconate, kayexelate, sodium bicarb  Overcorrected. Currently replacing.   Potassium stable.  Abnormal liver function   Acute hepatitis panel neg  HIV Neg  RUQ shows fatty liver disease.   CMP this AM stable.  Continue to monitor   AG acidosis  Corrected  Leukocytosis  CBC in AM  Alcoholism  CIWA   Seizure do   Keppra  iv bid, dilantin   Transition to oral.   Code Status: full Family Communication: none Disposition Plan: patient should be able to return tomorrow.   Consultants:  neuro  Procedures:  Antibiotics:  HPI/Subjective: Patient doing well.  No fevers, chills, nausea, vomiting. Would like to go home. Discussed waiting for rpt cultures and echo.  Objective: Vitals:   03/28/19 2324 03/29/19 0530  BP:  (!) 127/102 109/79  Pulse: 98 92  Resp:    Temp: 98.9 F (37.2 C) 98.2 F (36.8 C)  SpO2: 100% 99%    Intake/Output Summary (Last 24 hours) at 03/29/2019 0910 Last data filed at 03/28/2019 2000 Gross per 24 hour  Intake 840 ml  Output --  Net 840 ml   Filed Weights   03/27/19 0551 03/28/19 0330 03/29/19 0530  Weight: 40.1 kg 40.7 kg 40.6 kg    Exam:   General: Awake and alert.  Still somewhat confused as to where she is, but oriented to person.  Cardiovascular: Regular rate  Respiratory: Clear to auscultation bilaterally  Abdomen: No tenderness  Skin: Bruising on forehead, and around eyes - stable. Rosacea on face.   Musculoskeletal: No contractures.  Extremity: No edema. warm extremities. pulses 2+  Data Reviewed: Basic Metabolic Panel: Recent Labs  Lab 03/26/19 0651 03/26/19 1700 03/27/19 0502 03/27/19 1434 03/28/19 0324 03/29/19 0333  NA 136 139 141 135 138 140  K 4.3 3.0* 2.6* 3.2* 3.2* 3.8  CL 99 101 103 102 102 108  CO2 15* GLUCOSE 124* 89 75 80 88 84  BUN <5* <5* <5* <5* <5* <5*  CREATININE 0.67 0.53 0.58 0.46 0.39* 0.40*  CALCIUM 8.6* 8.7* 8.9 8.3* 8.6* 8.8*  MG 2.3  --   --  1.3* 1.8  --    Liver Function Tests: Recent Labs  Lab 03/26/19 0210 03/26/19 0651 03/27/19 0502 03/29/19 0333  AST 277* 209* 148* 249*  ALT 119* 107* 77* 123*  ALKPHOS 147* 141* 121 116  BILITOT 3.1* 2.0* 1.6* 1.0  PROT 7.0 7.1 6.3* 5.8*  ALBUMIN 3.9 3.7 3.2* 3.0*   Recent Labs  Lab 03/26/19 0210  LIPASE 23   Recent Labs  Lab 03/26/19 0651  AMMONIA 50*   CBC: Recent Labs  Lab 03/26/19 0247 03/27/19 0502 03/29/19 0333  WBC 15.7* 7.4 5.1  NEUTROABS 14.3*  --   --   HGB 12.0 10.9* 10.6*  HCT 35.2* 32.9* 31.9*  MCV 107.3* 109.7* 109.2*  PLT 199 156 147*   Cardiac Enzymes: Recent Labs  Lab 03/26/19 0651  CKTOTAL 738*  CKMB 10.4*   BNP (last 3 results) No results for input(s): BNP in the last 8760 hours.  ProBNP (last 3  results) No results for input(s): PROBNP in the last 8760 hours.  CBG: No results for input(s): GLUCAP in the last 168 hours.  Recent Results (from the past 240 hour(s))  Culture, blood (Routine X 2) w Reflex to ID Panel     Status: Abnormal   Collection Time: 03/26/19 12:10 AM   Specimen: BLOOD LEFT HAND  Result Value Ref Range Status   Specimen Description BLOOD LEFT HAND  Final   Special Requests   Final    BOTTLES DRAWN AEROBIC AND ANAEROBIC Blood Culture adequate volume   Culture  Setup Time   Final    IN BOTH AEROBIC AND ANAEROBIC BOTTLES GRAM POSITIVE COCCI CRITICAL RESULT CALLED TO, READ BACK BY AND VERIFIED WITH: Lelon Huh [facility] 03/26/19 2001 JDW Performed at [facility], [address], [place], [place] [address]    Culture (A)  Final    STAPHYLOCOCCUS AUREUS STREPTOCOCCUS MITIS/ORALIS    Report Status 03/29/2019 FINAL  Final   Organism ID, Bacteria STAPHYLOCOCCUS AUREUS  Final   Organism ID, Bacteria STREPTOCOCCUS MITIS/ORALIS  Final      Susceptibility   Staphylococcus aureus - MIC*    CIPROFLOXACIN <=0.5 SENSITIVE Sensitive     ERYTHROMYCIN RESISTANT Resistant     GENTAMICIN <=0.5 SENSITIVE Sensitive     OXACILLIN 0.5 SENSITIVE Sensitive     TETRACYCLINE <=1 SENSITIVE Sensitive     VANCOMYCIN <=0.5 SENSITIVE Sensitive     TRIMETH/SULFA <=10 SENSITIVE Sensitive     CLINDAMYCIN RESISTANT Resistant     RIFAMPIN <=0.5 SENSITIVE Sensitive     Inducible Clindamycin POSITIVE Resistant     * STAPHYLOCOCCUS AUREUS   Streptococcus mitis/oralis - MIC*    TETRACYCLINE 0.5 SENSITIVE Sensitive     VANCOMYCIN 0.5 SENSITIVE Sensitive     CLINDAMYCIN <=0.25 SENSITIVE Sensitive     PENICILLIN Value in next row Sensitive      SENSITIVE0.06    CEFTRIAXONE Value in next row Sensitive      SENSITIVE0.12    * STREPTOCOCCUS MITIS/ORALIS  Culture, blood (Routine X 2) w Reflex to ID Panel     Status: None (Preliminary result)   Collection Time: 03/26/19 12:10 AM    Specimen: BLOOD RIGHT FOREARM  Result Value Ref Range Status   Specimen Description BLOOD RIGHT FOREARM  Final   Special Requests   Final    BOTTLES DRAWN AEROBIC AND ANAEROBIC Blood Culture adequate volume   Culture   Final    NO GROWTH 3 DAYS Performed at [facility], [address] [place], [place] [address]    Report Status PENDING  Incomplete  SARS CORONAVIRUS 2 (TAT 6-24 HRS)     Status: None   Collection Time: 03/26/19  1:40 AM  Result Value Ref Range Status   SARS Coronavirus 2 NEGATIVE NEGATIVE Final    Comment: (NOTE) SARS-CoV-2 target nucleic acids are NOT DETECTED. The SARS-CoV-2 RNA is generally detectable in upper and lower respiratory specimens during the acute phase of infection. Negative results do not preclude SARS-CoV-2 infection, do not rule out co-infections with other pathogens, and should not be used as the sole basis for treatment or other patient management decisions. Negative results must be combined with clinical observations, patient history, and epidemiological information. The expected result is Negative. Fact Sheet for Patients: HairSlick.nohttps://www.fda.gov/media/138098/download Fact Sheet for Healthcare Providers: quierodirigir.comhttps://www.fda.gov/media/138095/download This test is not yet approved or cleared by the [place] FDA and  has been authorized for detection and/or diagnosis of SARS-CoV-2 by FDA under an Emergency Use Authorization (EUA). This EUA will remain  in effect (meaning this test can be used) for the duration of the COVID-19 declaration under Section 56 4(b)(1) of the Act, 21 U.S.C. section 360bbb-3(b)(1), unless the authorization is terminated or revoked sooner. Performed at [facility], [address] [place], [place] [address]   Culture, blood (routine x 2)     Status: None (Preliminary result)   Collection Time: 03/28/19 10:34 AM   Specimen: BLOOD LEFT HAND  Result Value Ref Range Status   Specimen Description BLOOD LEFT  HAND  Final   Special Requests   Final    BOTTLES DRAWN AEROBIC ONLY Blood Culture adequate volume   Culture   Final    NO GROWTH < 24 HOURS Performed at [facility], [address] [place], [place] [address]    Report Status PENDING  Incomplete  Culture, blood (routine x 2)     Status: None (Preliminary result)   Collection Time: 03/28/19 10:36 AM   Specimen: BLOOD LEFT ARM  Result Value Ref Range Status   Specimen Description BLOOD LEFT ARM  Final   Special Requests   Final    BOTTLES DRAWN AEROBIC ONLY Blood Culture results may not be optimal due to an inadequate volume of blood received in culture bottles   Culture   Final    NO GROWTH < 24 HOURS Performed at [facility], [address] [place], [place] [address]    Report Status PENDING  Incomplete  Culture, blood (routine x 2)     Status: None (Preliminary result)   Collection Time: 03/28/19  2:00 PM   Specimen: BLOOD  Result Value Ref Range Status   Specimen Description BLOOD RIGHT ANTECUBITAL  Final   Special Requests   Final    BOTTLES DRAWN AEROBIC ONLY Blood Culture adequate volume   Culture   Final    NO GROWTH < 24 HOURS Performed at [facility], [address] [place], [place] [address]    Report Status PENDING  Incomplete  Culture, blood (routine x 2)     Status: None (Preliminary result)   Collection Time: 03/28/19  2:08 PM   Specimen: BLOOD RIGHT HAND  Result Value Ref Range Status   Specimen Description BLOOD RIGHT HAND  Final   Special Requests   Final    BOTTLES DRAWN AEROBIC AND ANAEROBIC Blood Culture results may not be optimal due to an inadequate volume of blood received in culture bottles   Culture   Final    NO GROWTH < 24 HOURS Performed at [facility], [address] [place], [place] [address]    Report Status PENDING  Incomplete  Studies: No results found.  Scheduled Meds: . LORazepam  0-4 mg Intravenous Q12H  . multivitamin with minerals  1 tablet Oral  Daily  . multivitamin with minerals  1 tablet Oral Daily  . potassium chloride  40 mEq Oral BID  . sodium chloride flush  3 mL Intravenous Q12H  . thiamine  100 mg Oral Daily   Or  . thiamine  100 mg Intravenous Daily   Continuous Infusions: . sodium chloride Stopped (03/26/19 2242)  .  ceFAZolin (ANCEF) IV 2 g (03/29/19 0526)  . levETIRAcetam 1,500 mg (03/29/19 0821)  . phenytoin (DILANTIN) IV 150 mg (03/29/19 8563)      Time spent: [place]   03/29/2019, 9:10 AM  LOS: 2 days

## 2019-03-30 ENCOUNTER — Inpatient Hospital Stay: Payer: Self-pay

## 2019-03-30 ENCOUNTER — Other Ambulatory Visit: Payer: Self-pay

## 2019-03-30 DIAGNOSIS — B955 Unspecified streptococcus as the cause of diseases classified elsewhere: Secondary | ICD-10-CM | POA: Diagnosis present

## 2019-03-30 DIAGNOSIS — B954 Other streptococcus as the cause of diseases classified elsewhere: Secondary | ICD-10-CM

## 2019-03-30 DIAGNOSIS — R569 Unspecified convulsions: Secondary | ICD-10-CM

## 2019-03-30 DIAGNOSIS — R7881 Bacteremia: Secondary | ICD-10-CM | POA: Diagnosis present

## 2019-03-30 HISTORY — DX: Unspecified streptococcus as the cause of diseases classified elsewhere: B95.5

## 2019-03-30 LAB — COMPREHENSIVE METABOLIC PANEL
ALT: 83 U/L — ABNORMAL HIGH (ref 0–44)
AST: 113 U/L — ABNORMAL HIGH (ref 15–41)
Albumin: 3.1 g/dL — ABNORMAL LOW (ref 3.5–5.0)
Alkaline Phosphatase: 108 U/L (ref 38–126)
Anion gap: 11 (ref 5–15)
BUN: <5 mg/dL — ABNORMAL LOW (ref 6–20)
CO2: 21 mmol/L — ABNORMAL LOW (ref 22–32)
Calcium: 9.8 mg/dL (ref 8.9–10.3)
Chloride: 108 mmol/L (ref 98–111)
Creatinine, Ser: 0.46 mg/dL (ref 0.44–1.00)
GFR calc Af Amer: >60 mL/min (ref >60)
GFR calc non Af Amer: >60 mL/min (ref >60)
Glucose, Bld: 87 mg/dL (ref 70–99)
Potassium: 4.6 mmol/L (ref 3.5–5.1)
Sodium: 140 mmol/L (ref 135–145)
Total Bilirubin: 0.2 mg/dL — ABNORMAL LOW (ref 0.3–1.2)
Total Protein: 6.1 g/dL — ABNORMAL LOW (ref 6.5–8.1)

## 2019-03-30 MED ORDER — FOLIC ACID 1 MG PO TABS
1.0000 mg | ORAL_TABLET | Freq: Every day | ORAL | Status: DC
  Administered 2019-03-30 – 2019-03-31 (×2): 1 mg via ORAL
  Filled 2019-03-30 (×2): qty 1

## 2019-03-30 MED ORDER — THIAMINE HCL 100 MG/ML IJ SOLN: 100.0000 mg | Freq: Every day | INTRAMUSCULAR | Status: DC

## 2019-03-30 MED ORDER — LORAZEPAM 2 MG/ML IJ SOLN
1.0000 mg | INTRAMUSCULAR | Status: DC | PRN
  Administered 2019-03-30: 2 mg via INTRAVENOUS
  Filled 2019-03-30: qty 1

## 2019-03-30 MED ORDER — CEFTRIAXONE IV (FOR PTA / DISCHARGE USE ONLY): 2.0000 g | INTRAVENOUS | 0 refills | Status: AC

## 2019-03-30 MED ORDER — LORAZEPAM 1 MG PO TABS: 1.0000 mg | ORAL_TABLET | ORAL | Status: DC | PRN

## 2019-03-30 MED ORDER — SODIUM CHLORIDE 0.9 % IV SOLN
2.0000 g | INTRAVENOUS | Status: DC
  Administered 2019-03-30 – 2019-03-31 (×2): 2 g via INTRAVENOUS
  Filled 2019-03-30: qty 20
  Filled 2019-03-30: qty 2

## 2019-03-30 MED ORDER — SODIUM CHLORIDE 0.9% FLUSH: 10.0000 mL | INTRAVENOUS | Status: DC | PRN

## 2019-03-30 MED ORDER — VITAMIN B-1 100 MG PO TABS: 100.0000 mg | ORAL_TABLET | Freq: Every day | ORAL | Status: DC

## 2019-03-30 NOTE — TOC Progression Note (Signed)
Transition of Care [facility]) - Progression Note    Patient Details  Name: Alyssa Little MRN: [id number] Date of Birth: 10-05-1980  Transition [facility]) CM/SW Contact  Graves-Bigelow, Ocie Cornfield, RN Phone Number: 03/30/2019, 5:11 PM  Clinical Narrative: Patient's [employer] insurance has termed. Plans have now changed and patient will stay overnight. MD and Staff RN are aware. Case Manager will call short stay in the am to see if they can get her scheduled for PICC Care and Labs weekly.       Expected Discharge Plan: [place] Barriers to Discharge: No Barriers Identified  Expected Discharge Plan and Services Expected Discharge Plan: [place] In-house Referral: NA Discharge Planning Services: CM Consult Post Acute Care Choice: [place] arrangements for the past 2 months: Apartment Expected Discharge Date: 03/30/19                 HH Arranged: RN, IV Antibiotics HH Agency: [place] ([facility])(IV Antibiotics) Date HH Agency Contacted: 03/30/19 Time [place]: 1340 Representative spoke with at [place]: [place] with [facility]   Social Determinants of Health (SDOH) Interventions    Readmission Risk Interventions No flowsheet data found.

## 2019-03-30 NOTE — Progress Notes (Signed)
PHARMACY CONSULT NOTE FOR:  OUTPATIENT  PARENTERAL ANTIBIOTIC THERAPY (OPAT)  Indication: Strep/MSSA bacteremia Regimen: Ceftriaxone 2 gm IV q 24 hours  End date: 04/11/2019  IV antibiotic discharge orders are pended. To discharging provider:  please sign these orders via discharge navigator,  Select New Orders & click on the button choice - Manage This Unsigned Work.     Thank you for allowing pharmacy to be a part of this patient's care.  Jimmy Footman, PharmD, BCPS, [facility] Clinical Pharmacist Phone: [phone] 03/30/2019, 11:05 AM

## 2019-03-30 NOTE — Progress Notes (Signed)
Patient ID: Alyssa Little, female   DOB: [birth year], 38 y.o.   MRN: [id number]         [facility]  Date of Admission:  03/26/2019   Total days of antibiotics 4        Day 3 cefazolin         ASSESSMENT: It is unclear of any exactly what the significance of her positive blood cultures is.  Only 1 set was positive and she had no clear signs of infection upon admission.  I would certainly treat as though this might be true bacteremia.  Repeat blood cultures are negative and cardiology feels like her negative TTE was of sufficient quality to not proceed with TEE.  I recommend 2 weeks of IV antibiotic therapy starting from 03/28/2019, the date of her negative blood cultures.  I will simplify her therapy to ceftriaxone.  PLAN: 1. Change cefazolin to ceftriaxone 2 g IV daily 2. PICC placement 3. I will sign off now  Diagnosis: Bacteremia  Culture Result: MSSA and Streptococcus mitis/oralis  Allergies  Allergen Reactions  . Shellfish Allergy Anaphylaxis  . Penicillins Other (See Comments)    Unknown per family and friend  Has patient had a PCN reaction causing immediate rash, facial/tongue/throat swelling, SOB or lightheadedness with hypotension: Unknown Has patient had a PCN reaction causing severe rash involving mucus membranes or skin necrosis: Unknown Has patient had a PCN reaction that required hospitalization: Unknown Has patient had a PCN reaction occurring within the last 10 years: Unknown If all of the above answers are "NO", then may proceed with Cephalosporin use.   . Shellfish Allergy Other (See Comments)    Unknown per family and friend  . Penicillins Hives and Rash    OPAT Orders Discharge antibiotics: Per pharmacy protocol ceftriaxone  Duration: 2 weeks End Date: 04/11/2019  [facility] Care Per Protocol:  Labs weekly while on IV antibiotics: _x_ CBC with differential _x_ BMP __ CMP __ CRP __ ESR __ Vancomycin trough __  CK  _x_ Please pull PIC at completion of IV antibiotics __ Please leave PIC in place until doctor has seen patient or been notified  Fax weekly labs to [phone]  Clinic Follow Up Appt: As needed  Principal Problem:   Bacteremia due to methicillin susceptible Staphylococcus aureus (MSSA) Active Problems:   Streptococcal bacteremia   Seizures (HCC)   ETOH abuse   Altered mental status   Hyperkalemia   Hyponatremia   Abnormal liver function   AMS (altered mental status)   Scheduled Meds: . folic acid  1 mg Oral Daily  . levETIRAcetam  1,500 mg Oral BID  . multivitamin with minerals  1 tablet Oral Daily  . phenytoin  150 mg Oral [id number]  . sodium chloride flush  3 mL Intravenous Q12H  . thiamine  100 mg Oral Daily   Or  . thiamine  100 mg Intravenous Daily   Continuous Infusions: . sodium chloride Stopped (03/26/19 2242)  .  ceFAZolin (ANCEF) IV Stopped (03/30/19 0809)   PRN Meds:.sodium chloride, LORazepam **OR** LORazepam, nicotine, sodium chloride flush   SUBJECTIVE: She tells me that she has had 4 seizures in the past year.  She does not recall any fever prior to this admission.  She is feeling better and eager to go home.  She denies any illicit drug use.  Review of Systems: Review of Systems  Constitutional: Negative for fever.  Respiratory: Positive for cough. Negative for sputum production and shortness  of breath.   Cardiovascular: Negative for chest pain.  Gastrointestinal: Negative for abdominal pain, diarrhea, nausea and vomiting.  Genitourinary: Negative for dysuria.    Allergies  Allergen Reactions  . Shellfish Allergy Anaphylaxis  . Penicillins Other (See Comments)    Unknown per family and friend  Has patient had a PCN reaction causing immediate rash, facial/tongue/throat swelling, SOB or lightheadedness with hypotension: Unknown Has patient had a PCN reaction causing severe rash involving mucus membranes or skin necrosis: Unknown Has  patient had a PCN reaction that required hospitalization: Unknown Has patient had a PCN reaction occurring within the last 10 years: Unknown If all of the above answers are "NO", then may proceed with Cephalosporin use.   . Shellfish Allergy Other (See Comments)    Unknown per family and friend  . Penicillins Hives and Rash    OBJECTIVE: Vitals:   03/29/19 1837 03/30/19 0027 03/30/19 0627 03/30/19 0813  BP:  114/86 106/77 105/78  Pulse:  (!) 104 93 95  Resp: 18   16  Temp:  98.5 F (36.9 C) 98.1 F (36.7 C)   TempSrc:  Oral Oral   SpO2:  99% 98% 98%  Weight:   40.6 kg   Height:       Body mass index is 16.39 kg/m.  Physical Exam Constitutional:      Comments: She is alert and in no distress.  HENT:     Head:     Comments: Has extensive bruising on her face.  She also has diffuse facial erythema. Cardiovascular:     Rate and Rhythm: Normal rate and regular rhythm.     Heart sounds: No murmur.  Pulmonary:     Effort: Pulmonary effort is normal.     Breath sounds: Normal breath sounds.  Abdominal:     Palpations: Abdomen is soft.     Tenderness: There is no abdominal tenderness.  Musculoskeletal:        General: No swelling or tenderness.  Skin:    Findings: No rash.  Psychiatric:        Mood and Affect: Mood normal.     Lab Results Lab Results  Component Value Date   WBC 5.1 03/29/2019   HGB 10.6 (L) 03/29/2019   HCT 31.9 (L) 03/29/2019   MCV 109.2 (H) 03/29/2019   PLT 147 (L) 03/29/2019    Lab Results  Component Value Date   CREATININE 0.46 03/30/2019   BUN <5 (L) 03/30/2019   NA 140 03/30/2019   K 4.6 03/30/2019   CL 108 03/30/2019   CO2 21 (L) 03/30/2019    Lab Results  Component Value Date   ALT 83 (H) 03/30/2019   AST 113 (H) 03/30/2019   ALKPHOS 108 03/30/2019   BILITOT 0.2 (L) 03/30/2019     Microbiology: Recent Results (from the past 240 hour(s))  Culture, blood (Routine X 2) w Reflex to ID Panel     Status: Abnormal   Collection  Time: 03/26/19 12:10 AM   Specimen: BLOOD LEFT HAND  Result Value Ref Range Status   Specimen Description BLOOD LEFT HAND  Final   Special Requests   Final    BOTTLES DRAWN AEROBIC AND ANAEROBIC Blood Culture adequate volume   Culture  Setup Time   Final    IN BOTH AEROBIC AND ANAEROBIC BOTTLES GRAM POSITIVE COCCI CRITICAL RESULT CALLED TO, READ BACK BY AND VERIFIED WITH: Bronwen Betters [facility] 03/26/19 2001 JDW Performed at [facility], [place] [address],  [place], [place] [address]    Culture (A)  Final    STAPHYLOCOCCUS AUREUS STREPTOCOCCUS MITIS/ORALIS    Report Status 03/29/2019 FINAL  Final   Organism ID, Bacteria STAPHYLOCOCCUS AUREUS  Final   Organism ID, Bacteria STREPTOCOCCUS MITIS/ORALIS  Final      Susceptibility   Staphylococcus aureus - MIC*    CIPROFLOXACIN <=0.5 SENSITIVE Sensitive     ERYTHROMYCIN RESISTANT Resistant     GENTAMICIN <=0.5 SENSITIVE Sensitive     OXACILLIN 0.5 SENSITIVE Sensitive     TETRACYCLINE <=1 SENSITIVE Sensitive     VANCOMYCIN <=0.5 SENSITIVE Sensitive     TRIMETH/SULFA <=10 SENSITIVE Sensitive     CLINDAMYCIN RESISTANT Resistant     RIFAMPIN <=0.5 SENSITIVE Sensitive     Inducible Clindamycin POSITIVE Resistant     * STAPHYLOCOCCUS AUREUS   Streptococcus mitis/oralis - MIC*    TETRACYCLINE 0.5 SENSITIVE Sensitive     VANCOMYCIN 0.5 SENSITIVE Sensitive     CLINDAMYCIN <=0.25 SENSITIVE Sensitive     PENICILLIN Value in next row Sensitive      SENSITIVE0.06    CEFTRIAXONE Value in next row Sensitive      SENSITIVE0.12    * STREPTOCOCCUS MITIS/ORALIS  Culture, blood (Routine X 2) w Reflex to ID Panel     Status: None (Preliminary result)   Collection Time: 03/26/19 12:10 AM   Specimen: BLOOD RIGHT FOREARM  Result Value Ref Range Status   Specimen Description BLOOD RIGHT FOREARM  Final   Special Requests   Final    BOTTLES DRAWN AEROBIC AND ANAEROBIC Blood Culture adequate volume   Culture   Final    NO GROWTH 4 DAYS Performed at [facility], [address] [place], [place] [address]    Report Status PENDING  Incomplete  SARS CORONAVIRUS 2 (TAT 6-24 HRS)     Status: None   Collection Time: 03/26/19  1:40 AM  Result Value Ref Range Status   SARS Coronavirus 2 NEGATIVE NEGATIVE Final    Comment: (NOTE) SARS-CoV-2 target nucleic acids are NOT DETECTED. The SARS-CoV-2 RNA is generally detectable in upper and lower respiratory specimens during the acute phase of infection. Negative results do not preclude SARS-CoV-2 infection, do not rule out co-infections with other pathogens, and should not be used as the sole basis for treatment or other patient management decisions. Negative results must be combined with clinical observations, patient history, and epidemiological information. The expected result is Negative. Fact Sheet for Patients: SugarRoll.be Fact Sheet for Healthcare Providers: https://www.woods-mathews.com/ This test is not yet approved or cleared by the [place] FDA and  has been authorized for detection and/or diagnosis of SARS-CoV-2 by FDA under an Emergency Use Authorization (EUA). This EUA will remain  in effect (meaning this test can be used) for the duration of the COVID-19 declaration under Section 56 4(b)(1) of the Act, 21 U.S.C. section 360bbb-3(b)(1), unless the authorization is terminated or revoked sooner. Performed at [facility], [place] [address], [place], [place] [address]   Culture, blood (routine x 2)     Status: None (Preliminary result)   Collection Time: 03/28/19 10:34 AM   Specimen: BLOOD LEFT HAND  Result Value Ref Range Status   Specimen Description BLOOD LEFT HAND  Final   Special Requests   Final    BOTTLES DRAWN AEROBIC ONLY Blood Culture adequate volume   Culture   Final    NO GROWTH 2 DAYS Performed at [facility], [place] [address],  [place], [place] [address]    Report Status PENDING  Incomplete  Culture, blood  (routine x 2)     Status: None (Preliminary result)   Collection Time: 03/28/19 10:36 AM   Specimen: BLOOD LEFT ARM  Result Value Ref Range Status   Specimen Description BLOOD LEFT ARM  Final   Special Requests   Final    BOTTLES DRAWN AEROBIC ONLY Blood Culture results may not be optimal due to an inadequate volume of blood received in culture bottles   Culture   Final    NO GROWTH 2 DAYS Performed at [facility], [place] [address], [place], [place] [address]    Report Status PENDING  Incomplete  Culture, blood (routine x 2)     Status: None (Preliminary result)   Collection Time: 03/28/19  2:00 PM   Specimen: BLOOD  Result Value Ref Range Status   Specimen Description BLOOD RIGHT ANTECUBITAL  Final   Special Requests   Final    BOTTLES DRAWN AEROBIC ONLY Blood Culture adequate volume   Culture   Final    NO GROWTH 2 DAYS Performed at [facility], [place] [address] [place], [place] [address]    Report Status PENDING  Incomplete  Culture, blood (routine x 2)     Status: None (Preliminary result)   Collection Time: 03/28/19  2:08 PM   Specimen: BLOOD RIGHT HAND  Result Value Ref Range Status   Specimen Description BLOOD RIGHT HAND  Final   Special Requests   Final    BOTTLES DRAWN AEROBIC AND ANAEROBIC Blood Culture results may not be optimal due to an inadequate volume of blood received in culture bottles   Culture   Final    NO GROWTH 2 DAYS Performed at [facility], [address] [place], [place] [address]    Report Status PENDING  Incomplete    Michel Bickers, MD [facility] for [facility] [phone] pager   [phone] cell 03/30/2019, 10:55 AM

## 2019-03-30 NOTE — Discharge Instructions (Signed)
Bacteremia, Adult Bacteremia is the presence of bacteria in the blood. When bacteria enter the bloodstream, they can cause a life-threatening reaction called sepsis, which is a medical emergency. Bacteremia can spread to other parts of the body, including the heart, joints, and brain. What are the causes? This condition is caused by bacteria that get into the blood.  Bacteria can enter the blood: ? From a skin infection or injury, such as a burn or a cut. ? From a lung infection (pneumonia). ? From an infection in your stomach or intestines (gastrointestinal infection). ? From an infection in your bladder or urinary system (urinary tract infection). ? During a dental or medical procedure. ? From bleeding gums. ? When a bacterial infection in another part of your body spreads to your blood. ? Through an unclean (contaminated) needle. What increases the risk? This condition is more likely to develop in children, the elderly, and people who:  Have a long-term (chronic) disease or condition like diabetes or chronic kidney failure.  Have an artificial joint or heart valve.  Have heart valve disease.  Have a tube inserted to treat a medical condition, such as a urinary catheter or IV.  Have a weak disease-fighting system (immune system).  Inject illegal drugs.  Have been hospitalized for more than 10 days in a row. What are the signs or symptoms? Symptoms of this condition include:  Fever.  Chills.  Fast heartbeat.  Shortness of breath.  Dizziness.  Weakness.  Confusion.  Nausea or vomiting.  Diarrhea.  Low blood pressure.  Decreased urine output. Bacteremia that has spread to other parts of the body may cause symptoms in those areas. In some cases, there are no symptoms. How is this diagnosed? This condition may be diagnosed with a physical exam and tests, such as:  A complete blood count (CBC). This test checks for signs of infection.  Blood cultures. These  check for bacteria in your blood.  Tests of any tubes that you have had inserted. These tests check for a source of infection.  Urine tests, including urine cultures. These check for bacteria in the urine that could be a source of infection.  Imaging tests, such as an X-ray, CT scan, MRI, or heart ultrasound. These check for a source of infection in other parts of your body, such as your lungs, heart valves, or joints. How is this treated? This condition is usually treated in the hospital. Treatment may involve:  Antibiotic medicines. These may be given by mouth (orally) or directly into your blood through an IV (infusion through your vein). ? Depending on the source of infection, you may need antibiotics for several weeks. ? At first, you may be given an antibiotic to kill most types of blood bacteria (broad-spectrum antibiotic). If your test results show that a certain kind of bacteria is causing the problem, you may be given a different antibiotic to kill that specific bacteria.  IV fluids.  Removing any catheter or device that could be a source of infection.  Blood pressure and breathing support, if needed.  Surgery to control the source or the spread of infection, such as surgery to remove an infected device, abscess, or tissue.  Having follow-up visits for medicines, blood tests, and further evaluation. Follow these instructions at home: Medicines  Take over-the-counter and prescription medicines only as told by your health care provider.  If you were prescribed an antibiotic medicine, take it as told by your health care provider. Do not stop taking the   antibiotic even if you start to feel better. General instructions   Rest as needed. Ask your health care provider when you may return to normal activities.  Drink enough fluid to keep your urine pale yellow.  Do not use any products that contain nicotine or tobacco, such as cigarettes and e-cigarettes. If you need help  quitting, ask your health care provider.  Keep all follow-up visits as told by your health care provider. This is important. How is this prevented?   Wash your hands regularly with soap and water. If soap and water are not available, use hand sanitizer.  You should wash your hands: ? After using the toilet or changing a diaper. ? Before preparing, cooking, or serving food. ? While caring for a sick person or while visiting someone in a hospital. ? Before and after changing bandages (dressings) over wounds.  Clean any scrapes or cuts with soap and water and cover them with clean dressings.  Get vaccinations as recommended by your health care provider.  Practice good oral hygiene. Brush your teeth two times a day, and floss regularly.  Take good care of your skin. This includes bathing and moisturizing on a regular basis. Get help right away if you have:  Pain.  A fever or chills.  Trouble breathing.  A fast heart rate.  Skin that is blotchy, pale, or clammy.  Confusion.  Weakness.  Lack of energy (lethargy) or unusual sleepiness.  Diarrhea.  New symptoms that develop after treatment has started. These symptoms may represent a serious problem that is an emergency. Do not wait to see if the symptoms will go away. Get medical help right away. Call your local emergency services (911 in the [place] Do not drive yourself to the hospital. Summary  Bacteremia is the presence of bacteria in the blood. When bacteria enter the bloodstream, they can cause a life-threatening reaction called sepsis.  Some symptoms of bacteremia include fever, chills, shortness of breath, confusion, nausea or vomiting, and diarrhea.  Tests may be done to find the source of infection that led to bacteremia. These tests may include blood tests, urine tests, and imaging tests.  Bacteremia is usually treated with antibiotic medicines in the hospital.  Get help right away if you have any new symptoms  that develop after treatment has started. This information is not intended to replace advice given to you by your health care provider. Make sure you discuss any questions you have with your health care provider. Document Released: 01/14/2006 Document Revised: 08/12/2017 Document Reviewed: 08/12/2017 Elsevier Patient Education  2020 [employer]  

## 2019-03-30 NOTE — Discharge Summary (Signed)
Physician Discharge Summary  Alyssa Little LDJ:[id number] DOB: 1981/02/01 DOA: 03/26/2019  PCP: Carlena Hurl, PA-C  Admit date: 03/26/2019 Discharge date: 03/30/2019  Admitted From: home Disposition:  home  Recommendations for Outpatient Follow-up:  1. Follow up with PCP in 1-2 weeks 2. Please obtain BMP/CBC in one week 3. Please follow up on the following pending results:  Home Health: antibiotics at home Equipment/Devices:   Discharge Condition:stable  CODE STATUS:full  Diet recommendation: normal  Brief/Interim Summary: 38 year old female with a history of alcohol abuse and seizure disorder who was found confused and incoherent.  She fell in the bathtub and had a hematoma on her forehead.  She was found to be subtherapeutic on her antiepileptics.  She was started on Keppra and transition to IV valproic acid.  Additionally, blood cultures were drawn, which showed MSSA bacteremia x1 bottle.  Echo cardiogram showed no vegetations.  Patient was placed on antibiotics, which were gradually weaned to appropriate antibiotic coverage.  Her mentation improved within 48 hours.  As surveillance blood cultures were normal and she has been transitioned back to oral medications, the patient will be discharged on home antibiotics.  She was instructed to follow-up with her primary care physician in 2 weeks.  Precautions with her PICC line were discussed including tampering with or injecting any thing into the PICC line.  Discharge Diagnoses:  Principal Problem:   Bacteremia due to methicillin susceptible Staphylococcus aureus (MSSA) Active Problems:   Seizures (HCC)   ETOH abuse   Altered mental status   Hyperkalemia   Hyponatremia   Abnormal liver function   AMS (altered mental status)   Streptococcal bacteremia    Discharge Instructions  Discharge Instructions    Call MD for:  difficulty breathing, headache or visual disturbances   Complete by: As directed    Call MD  for:  extreme fatigue   Complete by: As directed    Call MD for:  hives   Complete by: As directed    Call MD for:  persistant dizziness or light-headedness   Complete by: As directed    Call MD for:  persistant nausea and vomiting   Complete by: As directed    Call MD for:  redness, tenderness, or signs of infection (pain, swelling, redness, odor or green/yellow discharge around incision site)   Complete by: As directed    Call MD for:  severe uncontrolled pain   Complete by: As directed    Call MD for:  temperature >100.4   Complete by: As directed    Diet - low sodium heart healthy   Complete by: As directed    Home infusion instructions [facility] May follow [place] Dosing Protocol; May administer Cathflo as needed to maintain patency of vascular access device.; Flushing of vascular access device: per [facility] Protocol: 0.9% NaCl pre/post medica...   Complete by: As directed    Instructions: May follow [place] Dosing Protocol   Instructions: May administer Cathflo as needed to maintain patency of vascular access device.   Instructions: Flushing of vascular access device: per [facility] Protocol: 0.9% NaCl pre/post medication administration and prn patency; Heparin 100 u/ml, 57m for implanted ports and Heparin 10u/ml, 554mfor all other central venous catheters.   Instructions: May follow [facility] Anaphylaxis Protocol for First Dose Administration in the home: 0.9% NaCl at 25-50 ml/hr to maintain IV access for protocol meds. Epinephrine 0.3 ml IV/IM PRN and Benadryl 25-50 IV/IM PRN s/s of anaphylaxis.   Instructions: [facility] Infusion Coordinator (RN) to assist per patient IV care needs in the home PRN.   Increase activity slowly   Complete by: As directed      Allergies as of 03/30/2019      Reactions   Shellfish Allergy Anaphylaxis   Penicillins Other (See Comments)   Unknown per family and friend Tolerated cephalosporins including ancef Has patient had a PCN reaction  causing immediate rash, facial/tongue/throat swelling, SOB or lightheadedness with hypotension: Unknown Has patient had a PCN reaction causing severe rash involving mucus membranes or skin necrosis: Unknown Has patient had a PCN reaction that required hospitalization: Unknown Has patient had a PCN reaction occurring within the last 10 years: Unknown If all of the above answers are "NO", then may   Shellfish Allergy Other (See Comments)   Unknown per family and friend   Penicillins Hives, Rash      Medication List    STOP taking these medications   doxycycline 100 MG tablet Commonly known as: VIBRA-TABS     TAKE these medications   acetaminophen 500 MG tablet Commonly known as: TYLENOL Take 500 mg by mouth every 6 (six) hours as needed (Headaches and muscle aches.).   cefTRIAXone  IVPB Commonly known as: ROCEPHIN Inject 2 g into the vein daily for 12 days. Indication:  Strep/MSSA bacteremia Last Day of Therapy:  04/11/2019 Labs - Once weekly:  CBC/D and BMP, Labs - Every other week:  ESR and CRP   levETIRAcetam 750 MG tablet Commonly known as: Keppra Take 2 tablets (1,500 mg total) by mouth 2 (two) times daily.   multivitamin with minerals Tabs tablet Take 1 tablet by mouth daily.   phenytoin 125 MG/5ML suspension Commonly known as: DILANTIN Take 6 mLs (150 mg total) by mouth 3 (three) times daily.            Home Infusion Instuctions  (From admission, onward)         Start     Ordered   03/30/19 0000  Home infusion instructions [facility] May follow [place] Dosing Protocol; May administer Cathflo as needed to maintain patency of vascular access device.; Flushing of vascular access device: per [facility] Protocol: 0.9% NaCl pre/post medica...    Question Answer Comment  Instructions May follow [place] Dosing Protocol   Instructions May administer Cathflo as needed to maintain patency of vascular access device.   Instructions Flushing of vascular access  device: per [facility] Protocol: 0.9% NaCl pre/post medication administration and prn patency; Heparin 100 u/ml, 73m for implanted ports and Heparin 10u/ml, 596mfor all other central venous catheters.   Instructions May follow [facility] Anaphylaxis Protocol for First Dose Administration in the home: 0.9% NaCl at 25-50 ml/hr to maintain IV access for protocol meds. Epinephrine 0.3 ml IV/IM PRN and Benadryl 25-50 IV/IM PRN s/s of anaphylaxis.   Instructions [facility] Infusion Coordinator (RN) to assist per patient IV care needs in the home PRN.      03/30/19 1327         Follow-up Information    Tysinger, DaCamelia EngPA-C. Schedule an appointment as soon as possible for a visit in 2 week(s).   Specialty: Family Medicine Contact information: [address] [place] [phone]        Allergies  Allergen Reactions  . Shellfish Allergy Anaphylaxis  . Penicillins Other (See Comments)    Unknown per family and friend Tolerated cephalosporins including ancef  Has patient had a PCN  reaction causing immediate rash, facial/tongue/throat swelling, SOB or lightheadedness with hypotension: Unknown Has patient had a PCN reaction causing severe rash involving mucus membranes or skin necrosis: Unknown Has patient had a PCN reaction that required hospitalization: Unknown Has patient had a PCN reaction occurring within the last 10 years: Unknown If all of the above answers are "NO", then may  . Shellfish Allergy Other (See Comments)    Unknown per family and friend  . Penicillins Hives and Rash    Consultations:  Infectious disease  Neurology   Procedures/Studies: CT Head Wo Contrast  Result Date: 03/26/2019 CLINICAL DATA:  Altered level of consciousness (LOC), unexplained; Polytrauma, critical, head/C-spine injury suspected Found on bathtub. Hematoma to left side of head. EXAM: CT HEAD WITHOUT CONTRAST CT CERVICAL SPINE WITHOUT CONTRAST TECHNIQUE: Multidetector CT imaging  of the head and cervical spine was performed following the standard protocol without intravenous contrast. Multiplanar CT image reconstructions of the cervical spine were also generated. COMPARISON:  Head CT 09/06/2018. Head and cervical spine CT 02/24/2018 FINDINGS: CT HEAD FINDINGS Brain: No intracranial hemorrhage, mass effect, or midline shift. Previous intracranial hemorrhage has resolved. Minimal encephalomalacia in the anterior right frontal lobe at site of prior hemorrhage. Mild generalized atrophy for age. The basilar cisterns are patent. No evidence of territorial infarct or acute ischemia. No extra-axial or intracranial fluid collection. Vascular: No hyperdense vessel. Skull: No fracture or focal lesion. Sinuses/Orbits: Partially obscured by motion. Grossly negative. Other: Large left frontal scalp hematoma. CT CERVICAL SPINE FINDINGS Alignment: Normal. Skull base and vertebrae: No acute fracture. Cervical vertebral body heights are maintained. Slight loss of height of superior endplate of T1 and T2, appears chronic but new from 02/24/2018. The dens and skull base are intact. Soft tissues and spinal canal: No prevertebral fluid or swelling. No visible canal hematoma. Disc levels:  Preserved. Upper chest: Negative. Other: None. IMPRESSION: 1. Large left frontal scalp hematoma. No acute intracranial abnormality. No skull fracture. 2. No acute fracture or subluxation of the cervical spine. Electronically Signed   By: Keith Rake M.D.   On: 03/26/2019 04:42   CT Cervical Spine Wo Contrast  Result Date: 03/26/2019 CLINICAL DATA:  Altered level of consciousness (LOC), unexplained; Polytrauma, critical, head/C-spine injury suspected Found on bathtub. Hematoma to left side of head. EXAM: CT HEAD WITHOUT CONTRAST CT CERVICAL SPINE WITHOUT CONTRAST TECHNIQUE: Multidetector CT imaging of the head and cervical spine was performed following the standard protocol without intravenous contrast. Multiplanar CT  image reconstructions of the cervical spine were also generated. COMPARISON:  Head CT 09/06/2018. Head and cervical spine CT 02/24/2018 FINDINGS: CT HEAD FINDINGS Brain: No intracranial hemorrhage, mass effect, or midline shift. Previous intracranial hemorrhage has resolved. Minimal encephalomalacia in the anterior right frontal lobe at site of prior hemorrhage. Mild generalized atrophy for age. The basilar cisterns are patent. No evidence of territorial infarct or acute ischemia. No extra-axial or intracranial fluid collection. Vascular: No hyperdense vessel. Skull: No fracture or focal lesion. Sinuses/Orbits: Partially obscured by motion. Grossly negative. Other: Large left frontal scalp hematoma. CT CERVICAL SPINE FINDINGS Alignment: Normal. Skull base and vertebrae: No acute fracture. Cervical vertebral body heights are maintained. Slight loss of height of superior endplate of T1 and T2, appears chronic but new from 02/24/2018. The dens and skull base are intact. Soft tissues and spinal canal: No prevertebral fluid or swelling. No visible canal hematoma. Disc levels:  Preserved. Upper chest: Negative. Other: None. IMPRESSION: 1. Large left frontal scalp hematoma. No acute intracranial abnormality.  No skull fracture. 2. No acute fracture or subluxation of the cervical spine. Electronically Signed   By: Keith Rake M.D.   On: 03/26/2019 04:42   MR BRAIN WO CONTRAST  Result Date: 03/26/2019 CLINICAL DATA:  Altered level of consciousness. Found in bathtub with hematoma on left side of head EXAM: MRI HEAD WITHOUT CONTRAST TECHNIQUE: Multiplanar, multiecho pulse sequences of the brain and surrounding structures were obtained without intravenous contrast. 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 This is presumably related to patient's  subarachnoid hemorrhage seen on 2019 head CT. No evidence of acute infarct, acute hemorrhage, hydrocephalus, mass, or collection. There is diminished brain volume greater than expected for age. Small areas of encephalomalacia and gliosis are seen along the cortex/subcortical white matter of the high bifrontal lobes. History of seizure with asymmetric small left hippocampus. Specific hippocampus sequence was not performed and signal comparison is suboptimal. Vascular: Normal flow voids Skull and upper cervical spine: Normal marrow signal Sinuses/Orbits: Large left scalp hemorrhage with swelling Other: There may be asymmetric right tongue atrophy or edema, only clearly covered on coronal T2 weighted imaging. No lesion is seen along the course of the hypoglossal nerve. IMPRESSION: 1. 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 There was sizable subarachnoid hemorrhage seen by CT in 2019. 3. Areas of cortical/subcortical gliosis in the high bifrontal region where there is chronic blood products, presumably posttraumatic. 4. Motion degraded. 5. Right tongue edema versus atrophy, please correlate with exam. Electronically Signed   By: Monte Fantasia M.D.   On: 03/26/2019 10:57   DG Chest Port 1 View  Result Date: 03/26/2019 CLINICAL DATA:  Found down in bathtub. Altered mental status. EXAM: PORTABLE CHEST 1 VIEW COMPARISON:  Radiograph 06/24/2018 FINDINGS: The cardiomediastinal contours are normal. The lungs are clear. Pulmonary vasculature is normal. No consolidation, pleural effusion, or pneumothorax. No acute osseous abnormalities are seen. IMPRESSION: Unremarkable single AP view of the chest. Electronically Signed   By: Keith Rake M.D.   On: 03/26/2019 03:23   DG Abd Portable 1V  Result Date: 03/26/2019 CLINICAL DATA:  Pre MRI screening. EXAM: PORTABLE ABDOMEN - 1 VIEW COMPARISON:  None. FINDINGS: Bowel gas pattern is nonobstructive. No metallic foreign bodies are  visualized. Minimal degenerative change of the hips. IMPRESSION: Nonobstructive bowel gas pattern. No metallic foreign bodies visualized. Electronically Signed   By: Marin Olp M.D.   On: 03/26/2019 07:55   EEG adult  Result Date: 03/26/2019 Lora Havens, MD     03/26/2019  7:03 PM Patient Name: Alyssa Little MRN: [id number] Epilepsy Attending: Lora Havens Referring Physician/Provider: Dr. Kathrynn Speed Date: 03/26/2019 Duration: 23.22 minutes Patient history: 38 year old female history of seizures who presented with altered mental status.  EEG to evaluate for seizures. Level of alertness: Awake/lethargic AEDs during EEG study: Lorazepam, Keppra, phenytoin Technical aspects: This EEG study was done with scalp electrodes positioned according to the 10-20 International system of electrode placement. Electrical activity was acquired at a sampling rate of 500Hz and reviewed with a high frequency filter of 70Hz and a low frequency filter of 1Hz. EEG data were recorded continuously and digitally stored. Description: During awake state, no clear posterior dominant rhythm was seen.  EEG showed continuous generalized 2 to 5 Hz theta-delta slowing with an overriding 15 to 18 Hz, 2-3 uV beta activity with  irregular morphology distributed symmetrically and diffusely. Hyperventilation and photic stimulation were not performed. Abnormality -Continuous slow, generalized -Excessive beta, generalized IMPRESSION: This study is suggestive of moderate to severe diffuse encephalopathy, nonspecific to etiology. The excessive beta activity seen in the background is most likely due to the effect of benzodiazepine and is a benign EEG pattern. No seizures or epileptiform discharges were seen throughout the recording. Lora Havens   ECHOCARDIOGRAM COMPLETE  Result Date: 03/29/2019   ECHOCARDIOGRAM REPORT   Patient Name:   Alyssa Little Date of Exam: 03/27/2019 Medical Rec #:  [id number]                 Height:       62.0 in Accession #:    [id number]               Weight:       88.4 lb Date of Birth:  [birth year]                BSA:          1.35 m Patient Age:    60 years                 BP:           103/72 mmHg Patient Gender: F                        HR:           84 bpm. Exam Location:  Inpatient Procedure: 2D Echo Indications:    bacteremia 790.7  History:        Patient has no prior history of Echocardiogram examinations.                 Signs/Symptoms:Altered Mental Status; Risk Factors:alcohol                 abuse.  Sonographer:    Johny Chess Referring Phys: [id number] [place]  1. Left ventricular ejection fraction, by visual estimation, is 55 to 60%. The left ventricle has normal function. There is no left ventricular hypertrophy.  2. The left ventricle has no regional wall motion abnormalities.  3. Global right ventricle has normal systolic function.The right ventricular size is small. No increase in right ventricular wall thickness.  4. Left atrial size was normal.  5. Right atrial size was normal.  6. The mitral valve is normal in structure. No evidence of mitral valve regurgitation. No evidence of mitral stenosis.  7. The tricuspid valve is normal in structure. Tricuspid valve regurgitation is trivial.  8. The aortic valve is tricuspid. Aortic valve regurgitation is not visualized. No evidence of aortic valve sclerosis or stenosis.  9. The pulmonic valve was not well visualized. Pulmonic valve regurgitation is not visualized. 10. Normal pulmonary artery systolic pressure. 11. The inferior vena cava is normal in size with greater than 50% respiratory variability, suggesting right atrial pressure of 3 mmHg. FINDINGS  Left Ventricle: Left ventricular ejection fraction, by visual estimation, is 55 to 60%. The left ventricle has normal function. The left ventricle has no regional wall motion abnormalities. There is no left ventricular hypertrophy. Left ventricular  diastolic parameters were normal. Right Ventricle: The right ventricular size is small. No increase in right ventricular wall thickness. Global RV systolic function is has normal systolic function. The tricuspid regurgitant velocity is 2.02 m/s, and with an assumed right atrial pressure of 3 mmHg, the estimated right ventricular  systolic pressure is normal at 19.3 mmHg. Left Atrium: Left atrial size was normal in size. Right Atrium: Right atrial size was normal in size Pericardium: There is no evidence of pericardial effusion. Mitral Valve: The mitral valve is normal in structure. No evidence of mitral valve regurgitation. No evidence of mitral valve stenosis by observation. Tricuspid Valve: The tricuspid valve is normal in structure. Tricuspid valve regurgitation is trivial. Aortic Valve: The aortic valve is tricuspid. Aortic valve regurgitation is not visualized. The aortic valve is structurally normal, with no evidence of sclerosis or stenosis. Aortic valve mean gradient measures 3.0 mmHg. Aortic valve peak gradient measures 5.8 mmHg. Aortic valve area, by VTI measures 2.07 cm. Pulmonic Valve: The pulmonic valve was not well visualized. Pulmonic valve regurgitation is not visualized. Pulmonic regurgitation is not visualized. No evidence of pulmonic stenosis. Aorta: The aortic root is normal in size and structure. Pulmonary Artery: Indeterminate PASP, inadequate TR jet. Venous: The inferior vena cava is normal in size with greater than 50% respiratory variability, suggesting right atrial pressure of 3 mmHg. IAS/Shunts: No atrial level shunt detected by color flow Doppler.  LEFT VENTRICLE PLAX 2D LVIDd:         3.50 cm  Diastology LVIDs:         2.50 cm  LV e' medial:   7.18 cm/s LV PW:         1.00 cm  LV E/e' medial: 9.6 LV IVS:        0.90 cm LVOT diam:     1.90 cm LV SV:         29 ml LV SV Index:   21.75 LVOT Area:     2.84 cm  RIGHT VENTRICLE RV S prime:     8.81 cm/s TAPSE (M-mode): 1.6 cm LEFT ATRIUM              Index       RIGHT ATRIUM           Index LA diam:        3.00 cm 2.22 cm/m  RA Area:     10.70 cm LA Vol (A2C):   28.8 ml 21.31 ml/m RA Volume:   24.00 ml  17.76 ml/m LA Vol (A4C):   34.7 ml 25.68 ml/m LA Biplane Vol: 31.9 ml 23.61 ml/m  AORTIC VALVE AV Area (Vmax):    1.94 cm AV Area (Vmean):   1.90 cm AV Area (VTI):     2.07 cm AV Vmax:           120.81 cm/s AV Vmean:          81.383 cm/s AV VTI:            0.198 m AV Peak Grad:      5.8 mmHg AV Mean Grad:      3.0 mmHg LVOT Vmax:         82.57 cm/s LVOT Vmean:        54.481 cm/s LVOT VTI:          0.144 m LVOT/AV VTI ratio: 0.73  AORTA Ao Root diam: 3.00 cm MITRAL VALVE                        TRICUSPID VALVE MV Area (PHT): 3.72 cm             TR Peak grad:   16.3 mmHg MV PHT:        59.16 msec  TR Vmax:        202.00 cm/s MV Decel Time: 204 msec MV E velocity: 68.60 cm/s 103 cm/s  SHUNTS MV A velocity: 78.00 cm/s 70.3 cm/s Systemic VTI:  0.14 m MV E/A ratio:  0.88       1.5       Systemic Diam: 1.90 cm MV A Prime:    10.4 cm/s  Carlyle Dolly MD Electronically signed by Carlyle Dolly MD Signature Date/Time: 03/29/2019/9:49:09 AM    Final    [name] EKG SITE RITE  Result Date: 03/30/2019 If Site Rite image not attached, placement could not be confirmed due to current cardiac rhythm.  US Abdomen Limited RUQ  Result Date: 03/26/2019 CLINICAL DATA:  Abnormal liver function. Additional history provided by scanning technologist: TBI, history of alcohol abuse. EXAM: ULTRASOUND ABDOMEN LIMITED RIGHT UPPER QUADRANT COMPARISON:  No pertinent prior studies available for comparison. FINDINGS: Gallbladder: No gallstones or wall thickening visualized. No sonographic Murphy sign noted by sonographer. Common bile duct: Diameter: 3 mm, within normal limits. Liver: No focal lesion identified. Increased hepatic parenchymal echogenicity.Portal vein is patent on color Doppler imaging with normal direction of blood flow towards the liver. IMPRESSION:  Increased hepatic parenchymal echogenicity. This is a nonspecific finding, most commonly seen in the setting of hepatic steatosis. Otherwise unremarkable right upper quadrant ultrasound, as detailed. Electronically Signed   By: Kellie Simmering DO   On: 03/26/2019 07:23       Subjective:   Discharge Exam: Vitals:   03/30/19 0627 03/30/19 0813  BP: 106/77 105/78  Pulse: 93 95  Resp:  16  Temp: 98.1 F (36.7 C)   SpO2: 98% 98%   Vitals:   03/29/19 1837 03/30/19 0027 03/30/19 0627 03/30/19 0813  BP:  114/86 106/77 105/78  Pulse:  (!) 104 93 95  Resp: 18   16  Temp:  98.5 F (36.9 C) 98.1 F (36.7 C)   TempSrc:  Oral Oral   SpO2:  99% 98% 98%  Weight:   40.6 kg   Height:        General: Pt is alert, awake, not in acute distress Cardiovascular: RRR, S1/S2 +, no rubs, no gallops Respiratory: CTA bilaterally, no wheezing, no rhonchi Abdominal: Soft, NT, ND, bowel sounds + Extremities: no edema, no cyanosis    The results of significant diagnostics from this hospitalization (including imaging, microbiology, ancillary and laboratory) are listed below for reference.     Microbiology: Recent Results (from the past 240 hour(s))  Culture, blood (Routine X 2) w Reflex to ID Panel     Status: Abnormal   Collection Time: 03/26/19 12:10 AM   Specimen: BLOOD LEFT HAND  Result Value Ref Range Status   Specimen Description BLOOD LEFT HAND  Final   Special Requests   Final    BOTTLES DRAWN AEROBIC AND ANAEROBIC Blood Culture adequate volume   Culture  Setup Time   Final    IN BOTH AEROBIC AND ANAEROBIC BOTTLES GRAM POSITIVE COCCI CRITICAL RESULT CALLED TO, READ BACK BY AND VERIFIED WITH: Bronwen Betters [facility] 03/26/19 2001 JDW Performed at [facility], [address] [place], [place] [address]    Culture (A)  Final    STAPHYLOCOCCUS AUREUS STREPTOCOCCUS MITIS/ORALIS    Report Status 03/29/2019 FINAL  Final   Organism ID, Bacteria STAPHYLOCOCCUS AUREUS  Final   Organism ID,  Bacteria STREPTOCOCCUS MITIS/ORALIS  Final      Susceptibility   Staphylococcus aureus - MIC*    CIPROFLOXACIN <=  0.5 SENSITIVE Sensitive     ERYTHROMYCIN RESISTANT Resistant     GENTAMICIN <=0.5 SENSITIVE Sensitive     OXACILLIN 0.5 SENSITIVE Sensitive     TETRACYCLINE <=1 SENSITIVE Sensitive     VANCOMYCIN <=0.5 SENSITIVE Sensitive     TRIMETH/SULFA <=10 SENSITIVE Sensitive     CLINDAMYCIN RESISTANT Resistant     RIFAMPIN <=0.5 SENSITIVE Sensitive     Inducible Clindamycin POSITIVE Resistant     * STAPHYLOCOCCUS AUREUS   Streptococcus mitis/oralis - MIC*    TETRACYCLINE 0.5 SENSITIVE Sensitive     VANCOMYCIN 0.5 SENSITIVE Sensitive     CLINDAMYCIN <=0.25 SENSITIVE Sensitive     PENICILLIN Value in next row Sensitive      SENSITIVE0.06    CEFTRIAXONE Value in next row Sensitive      SENSITIVE0.12    * STREPTOCOCCUS MITIS/ORALIS  Culture, blood (Routine X 2) w Reflex to ID Panel     Status: None (Preliminary result)   Collection Time: 03/26/19 12:10 AM   Specimen: BLOOD RIGHT FOREARM  Result Value Ref Range Status   Specimen Description BLOOD RIGHT FOREARM  Final   Special Requests   Final    BOTTLES DRAWN AEROBIC AND ANAEROBIC Blood Culture adequate volume   Culture   Final    NO GROWTH 4 DAYS Performed at [facility], [address], [place], [place] [address]    Report Status PENDING  Incomplete  SARS CORONAVIRUS 2 (TAT 6-24 HRS)     Status: None   Collection Time: 03/26/19  1:40 AM  Result Value Ref Range Status   SARS Coronavirus 2 NEGATIVE NEGATIVE Final    Comment: (NOTE) SARS-CoV-2 target nucleic acids are NOT DETECTED. The SARS-CoV-2 RNA is generally detectable in upper and lower respiratory specimens during the acute phase of infection. Negative results do not preclude SARS-CoV-2 infection, do not rule out co-infections with other pathogens, and should not be used as the sole basis for treatment or other patient management decisions. Negative results must  be combined with clinical observations, patient history, and epidemiological information. The expected result is Negative. Fact Sheet for Patients: SugarRoll.be Fact Sheet for Healthcare Providers: https://www.woods-mathews.com/ This test is not yet approved or cleared by the [place] FDA and  has been authorized for detection and/or diagnosis of SARS-CoV-2 by FDA under an Emergency Use Authorization (EUA). This EUA will remain  in effect (meaning this test can be used) for the duration of the COVID-19 declaration under Section 56 4(b)(1) of the Act, 21 U.S.C. section 360bbb-3(b)(1), unless the authorization is terminated or revoked sooner. Performed at [facility], [place] [address], [place], [place] [address]   Culture, blood (routine x 2)     Status: None (Preliminary result)   Collection Time: 03/28/19 10:34 AM   Specimen: BLOOD LEFT HAND  Result Value Ref Range Status   Specimen Description BLOOD LEFT HAND  Final   Special Requests   Final    BOTTLES DRAWN AEROBIC ONLY Blood Culture adequate volume   Culture   Final    NO GROWTH 2 DAYS Performed at [facility], [place] [address], [place], [place] [address]    Report Status PENDING  Incomplete  Culture, blood (routine x 2)     Status: None (Preliminary result)   Collection Time: 03/28/19 10:36 AM   Specimen: BLOOD LEFT ARM  Result Value Ref Range Status   Specimen Description BLOOD LEFT ARM  Final   Special Requests   Final  BOTTLES DRAWN AEROBIC ONLY Blood Culture results may not be optimal due to an inadequate volume of blood received in culture bottles   Culture   Final    NO GROWTH 2 DAYS Performed at [facility], [place] [address], [place], [place] [address]    Report Status PENDING  Incomplete  Culture, blood (routine x 2)     Status: None (Preliminary result)   Collection Time: 03/28/19  2:00 PM   Specimen: BLOOD  Result Value Ref Range Status    Specimen Description BLOOD RIGHT ANTECUBITAL  Final   Special Requests   Final    BOTTLES DRAWN AEROBIC ONLY Blood Culture adequate volume   Culture   Final    NO GROWTH 2 DAYS Performed at [facility], [place] [address], [place], [place] [address]    Report Status PENDING  Incomplete  Culture, blood (routine x 2)     Status: None (Preliminary result)   Collection Time: 03/28/19  2:08 PM   Specimen: BLOOD RIGHT HAND  Result Value Ref Range Status   Specimen Description BLOOD RIGHT HAND  Final   Special Requests   Final    BOTTLES DRAWN AEROBIC AND ANAEROBIC Blood Culture results may not be optimal due to an inadequate volume of blood received in culture bottles   Culture   Final    NO GROWTH 2 DAYS Performed at [facility], [place] [address], [place], [place] [address]    Report Status PENDING  Incomplete     Labs: BNP (last 3 results) No results for input(s): BNP in the last 8760 hours. Basic Metabolic Panel: Recent Labs  Lab 03/26/19 0651 03/27/19 0502 03/27/19 1434 03/28/19 0324 03/29/19 0333 03/30/19 0510  NA 136 141 135 138 140 140  K 4.3 2.6* 3.2* 3.2* 3.8 4.6  CL 99 103 102 102 108 108  CO2 15* _0 21*  GLUCOSE 124* 75 80 88 84 87  BUN <5* <5* <5* <5* <5* <5*  CREATININE 0.67 0.58 0.46 0.39* 0.40* 0.46  CALCIUM 8.6* 8.9 8.3* 8.6* 8.8* 9.8  MG 2.3  --  1.3* 1.8  --   --    Liver Function Tests: Recent Labs  Lab 03/26/19 0210 03/26/19 0651 03/27/19 0502 03/29/19 0333 03/30/19 0510  AST 277* 209* 148* 249* 113*  ALT 119* 107* 77* 123* 83*  ALKPHOS 147* 141* 121 116 108  BILITOT 3.1* 2.0* 1.6* 1.0 0.2*  PROT 7.0 7.1 6.3* 5.8* 6.1*  ALBUMIN 3.9 3.7 3.2* 3.0* 3.1*   Recent Labs  Lab 03/26/19 0210  LIPASE 23   Recent Labs  Lab 03/26/19 0651  AMMONIA 50*   CBC: Recent Labs  Lab 03/26/19 0247 03/27/19 0502 03/29/19 0333  WBC 15.7* 7.4 5.1  NEUTROABS 14.3*  --   --   HGB 12.0 10.9* 10.6*  HCT 35.2* 32.9* 31.9*  MCV 107.3*  109.7* 109.2*  PLT 199 156 147*   Cardiac Enzymes: Recent Labs  Lab 03/26/19 0651  CKTOTAL 738*  CKMB 10.4*   BNP: Invalid input(s): POCBNP CBG: No results for input(s): GLUCAP in the last 168 hours. D-Dimer No results for input(s): DDIMER in the last 72 hours. Hgb A1c No results for input(s): HGBA1C in the last 72 hours. Lipid Profile No results for input(s): CHOL, HDL, LDLCALC, TRIG, CHOLHDL, LDLDIRECT in the last 72 hours. Thyroid function studies No results for input(s): TSH, T4TOTAL, T3FREE, THYROIDAB in the last 72 hours.  Invalid input(s): FREET3  Anemia work up No results for input(s): VITAMINB12, FOLATE, FERRITIN, TIBC, IRON, RETICCTPCT in the last 72 hours. Urinalysis    Component Value Date/Time   COLORURINE YELLOW 03/26/2019 [place] 03/26/2019 0213   LABSPEC 1.018 03/26/2019 0213   PHURINE 5.0 03/26/2019 0213   GLUCOSEU 150 (A) 03/26/2019 0213   HGBUR MODERATE (A) 03/26/2019 0213   BILIRUBINUR NEGATIVE 03/26/2019 0213   KETONESUR 80 (A) 03/26/2019 0213   PROTEINUR 100 (A) 03/26/2019 0213   NITRITE NEGATIVE 03/26/2019 0213   LEUKOCYTESUR NEGATIVE 03/26/2019 0213   Sepsis Labs Invalid input(s): PROCALCITONIN,  WBC,  LACTICIDVEN Microbiology Recent Results (from the past 240 hour(s))  Culture, blood (Routine X 2) w Reflex to ID Panel     Status: Abnormal   Collection Time: 03/26/19 12:10 AM   Specimen: BLOOD LEFT HAND  Result Value Ref Range Status   Specimen Description BLOOD LEFT HAND  Final   Special Requests   Final    BOTTLES DRAWN AEROBIC AND ANAEROBIC Blood Culture adequate volume   Culture  Setup Time   Final    IN BOTH AEROBIC AND ANAEROBIC BOTTLES GRAM POSITIVE COCCI CRITICAL RESULT CALLED TO, READ BACK BY AND VERIFIED WITH: Bronwen Betters [facility] 03/26/19 2001 JDW Performed at [facility], [place] [address], [place], [place] [address]    Culture (A)  Final    STAPHYLOCOCCUS AUREUS STREPTOCOCCUS MITIS/ORALIS    Report  Status 03/29/2019 FINAL  Final   Organism ID, Bacteria STAPHYLOCOCCUS AUREUS  Final   Organism ID, Bacteria STREPTOCOCCUS MITIS/ORALIS  Final      Susceptibility   Staphylococcus aureus - MIC*    CIPROFLOXACIN <=0.5 SENSITIVE Sensitive     ERYTHROMYCIN RESISTANT Resistant     GENTAMICIN <=0.5 SENSITIVE Sensitive     OXACILLIN 0.5 SENSITIVE Sensitive     TETRACYCLINE <=1 SENSITIVE Sensitive     VANCOMYCIN <=0.5 SENSITIVE Sensitive     TRIMETH/SULFA <=10 SENSITIVE Sensitive     CLINDAMYCIN RESISTANT Resistant     RIFAMPIN <=0.5 SENSITIVE Sensitive     Inducible Clindamycin POSITIVE Resistant     * STAPHYLOCOCCUS AUREUS   Streptococcus mitis/oralis - MIC*    TETRACYCLINE 0.5 SENSITIVE Sensitive     VANCOMYCIN 0.5 SENSITIVE Sensitive     CLINDAMYCIN <=0.25 SENSITIVE Sensitive     PENICILLIN Value in next row Sensitive      SENSITIVE0.06    CEFTRIAXONE Value in next row Sensitive      SENSITIVE0.12    * STREPTOCOCCUS MITIS/ORALIS  Culture, blood (Routine X 2) w Reflex to ID Panel     Status: None (Preliminary result)   Collection Time: 03/26/19 12:10 AM   Specimen: BLOOD RIGHT FOREARM  Result Value Ref Range Status   Specimen Description BLOOD RIGHT FOREARM  Final   Special Requests   Final    BOTTLES DRAWN AEROBIC AND ANAEROBIC Blood Culture adequate volume   Culture   Final    NO GROWTH 4 DAYS Performed at [facility], [address], [place], [place] [address]    Report Status PENDING  Incomplete  SARS CORONAVIRUS 2 (TAT 6-24 HRS)     Status: None   Collection Time: 03/26/19  1:40 AM  Result Value Ref Range Status   SARS Coronavirus 2 NEGATIVE NEGATIVE Final    Comment: (NOTE) SARS-CoV-2 target nucleic acids are NOT DETECTED. The SARS-CoV-2 RNA is generally detectable in upper and lower respiratory specimens during the acute phase of infection. Negative results do  not preclude SARS-CoV-2 infection, do not rule out co-infections with other pathogens, and should not  be used as the sole basis for treatment or other patient management decisions. Negative results must be combined with clinical observations, patient history, and epidemiological information. The expected result is Negative. Fact Sheet for Patients: SugarRoll.be Fact Sheet for Healthcare Providers: https://www.woods-mathews.com/ This test is not yet approved or cleared by the [place] FDA and  has been authorized for detection and/or diagnosis of SARS-CoV-2 by FDA under an Emergency Use Authorization (EUA). This EUA will remain  in effect (meaning this test can be used) for the duration of the COVID-19 declaration under Section 56 4(b)(1) of the Act, 21 U.S.C. section 360bbb-3(b)(1), unless the authorization is terminated or revoked sooner. Performed at [facility], [place] [address], [place], [place] [address]   Culture, blood (routine x 2)     Status: None (Preliminary result)   Collection Time: 03/28/19 10:34 AM   Specimen: BLOOD LEFT HAND  Result Value Ref Range Status   Specimen Description BLOOD LEFT HAND  Final   Special Requests   Final    BOTTLES DRAWN AEROBIC ONLY Blood Culture adequate volume   Culture   Final    NO GROWTH 2 DAYS Performed at [facility], [place] [address] [place], [place] [address]    Report Status PENDING  Incomplete  Culture, blood (routine x 2)     Status: None (Preliminary result)   Collection Time: 03/28/19 10:36 AM   Specimen: BLOOD LEFT ARM  Result Value Ref Range Status   Specimen Description BLOOD LEFT ARM  Final   Special Requests   Final    BOTTLES DRAWN AEROBIC ONLY Blood Culture results may not be optimal due to an inadequate volume of blood received in culture bottles   Culture   Final    NO GROWTH 2 DAYS Performed at [facility], [place] [address] [place], [place] [address]    Report Status PENDING  Incomplete  Culture, blood (routine x 2)     Status: None (Preliminary  result)   Collection Time: 03/28/19  2:00 PM   Specimen: BLOOD  Result Value Ref Range Status   Specimen Description BLOOD RIGHT ANTECUBITAL  Final   Special Requests   Final    BOTTLES DRAWN AEROBIC ONLY Blood Culture adequate volume   Culture   Final    NO GROWTH 2 DAYS Performed at [facility], [place] [address], [place], [place] [address]    Report Status PENDING  Incomplete  Culture, blood (routine x 2)     Status: None (Preliminary result)   Collection Time: 03/28/19  2:08 PM   Specimen: BLOOD RIGHT HAND  Result Value Ref Range Status   Specimen Description BLOOD RIGHT HAND  Final   Special Requests   Final    BOTTLES DRAWN AEROBIC AND ANAEROBIC Blood Culture results may not be optimal due to an inadequate volume of blood received in culture bottles   Culture   Final    NO GROWTH 2 DAYS Performed at [facility], [place] [address] [place], [place] [address]    Report Status PENDING  Incomplete     Time coordinating discharge: Over 30 minutes  SIGNED:   Truett Mainland, DO [facility] 03/30/2019, 1:31 PM Pager   If 7PM-7AM, please contact night-coverage [email or web address] Password [email or web address]

## 2019-03-30 NOTE — TOC Initial Note (Signed)
Transition of Care [facility]) - Initial/Assessment Note    Patient Details  Name: Alyssa Little MRN: [id number] Date of Birth: 12/25/1980  Transition of Care [facility]) CM/SW Contact:    Bethena Roys, RN Phone Number: 03/30/2019, 1:42 PM  Clinical Narrative:  Pt presented after a fall at home. Plan will to return home with IV antibiotics. Prior to arrival from home with significant other. Patient is agreeable to [place] with [facility] and [facility]. Patient states she will be able to learn how to administer the antibiotics. Per patient her transportation cannot get here until after 8:00 pm- [facility] Liaison Carolynn Sayers will educate the patient prior to transition home. No further needs from Case Manager at this time.                 Expected Discharge Plan: [place] Barriers to Discharge: No Barriers Identified   Patient Goals and CMS Choice Patient states their goals for this hospitalization and ongoing recovery are:: "to return home with boyfriend" CMS Medicare.gov Compare Post Acute Care list provided to:: Other (Comment Required)(Patient has [facility] agreeable to referral via ID- patient agreeable to have them provide antibioitcs along with [place].) Choice offered to / list presented to : (Please see above note.)  Expected Discharge Plan and Services Expected Discharge Plan: [place] In-house Referral: NA Discharge Planning Services: CM Consult Post Acute Care Choice: [place] arrangements for the past 2 months: Apartment Expected Discharge Date: 03/30/19                  HH Arranged: RN, IV Antibiotics HH Agency: [place] ([facility]iotics) Date HH Agency Contacted: 03/30/19 Time HH Agency Contacted: 78 Representative spoke with at [place]: [place] with [facility]  Prior Living Arrangements/Services Living arrangements for the past 2 months: Apartment Lives with:: Significant Other Patient language and need for interpreter reviewed:: No Do you feel safe going back to the place where you live?: Yes      Need for Family Participation in Patient Care: Yes (Comment) Care giver support system in place?: Yes (comment) Current home services: Home RN Criminal Activity/Legal Involvement Pertinent to Current Situation/Hospitalization: No - Comment as needed  Activities of Daily Living Home Assistive Devices/Equipment: None ADL Screening (condition at time of admission) Patient's cognitive ability adequate to safely complete daily activities?: Yes Is the patient deaf or have difficulty hearing?: No Does the patient have difficulty seeing, even when wearing glasses/contacts?: No Does the patient have difficulty concentrating, remembering, or making decisions?: No Patient able to express need for assistance with ADLs?: Yes Does the patient have difficulty dressing or bathing?: No Independently performs ADLs?: Yes (appropriate for developmental age) Does the patient have difficulty walking or climbing stairs?: No Weakness of Legs: None Weakness of Arms/Hands: None  Permission Sought/Granted Permission sought to share information with : Family Supports Permission granted to share information with : Yes, Verbal Permission Granted     Permission granted to share info w AGENCY: [facility] and [facility]        Emotional Assessment Appearance:: Appears stated age Attitude/Demeanor/Rapport: Engaged Affect (typically observed): Accepting Orientation: : Oriented to Self, Oriented to Place, Oriented to  Time, Oriented to Situation Alcohol / Substance Use: Not Applicable Psych Involvement: No (comment)  Admission diagnosis:  Altered mental status [  R41.82] Abnormal liver function [R94.5] 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 Streptococcal bacteremia 03/30/2019  . AMS (altered mental status) 03/27/2019  . Bacteremia due to methicillin susceptible Staphylococcus aureus (MSSA) 03/27/2019  . Altered mental status 03/26/2019  . Hyperkalemia 03/26/2019  . Hyponatremia 03/26/2019  . Abnormal liver function 03/26/2019  . Vitamin deficiency 09/29/2018  . Malnutrition (HCC) 09/29/2018  . Underweight 09/29/2018  . Alcohol use 09/29/2018  . Diarrhea 09/29/2018  . Secondary amenorrhea 09/29/2018  . Rosacea 09/29/2018  . Mood change 09/29/2018  . Seizure-like activity (HCC) 09/29/2018  . Cough 09/29/2018  . Elevated liver enzymes 09/29/2018  . Anemia 09/29/2018  . Encephalopathy acute 06/24/2018  . Lactic acidemia 06/24/2018  . Open scalp wound 04/22/2018  . Transaminitis   . TBI (traumatic brain injury) (HCC) 03/05/2018  . ETOH abuse   . Tachypnea   . Traumatic brain injury with loss of consciousness (HCC)   . Sinus tachycardia   . Seizures (HCC)   . Acute blood loss anemia   . Malnutrition of moderate degree 02/26/2018  . SAH (subarachnoid hemorrhage) (HCC) 02/24/2018   PCP:  Jac Canavan, PA-C Pharmacy:   [facility] [phone] [place], [place] - [address] [facility] [address] [place] [address] Phone: [phone] Fax: [phone]     Social Determinants of Health (SDOH) Interventions    Readmission Risk Interventions No flowsheet data found.

## 2019-03-30 NOTE — Progress Notes (Signed)
Patient increasingly agitated and stating feeling very anxious. Patient getting up from bed without assistance, pulling on telemetry cords. Upon assessment patient having visible tremors, diaphoretic, tachycardic, and insomnia. CIWA 12. Paged [facility], MD reinstated CIWA/ativan orders. Will monitor patient closely.

## 2019-03-31 LAB — CULTURE, BLOOD (ROUTINE X 2)
Culture: NO GROWTH
Special Requests: ADEQUATE

## 2019-03-31 LAB — COMPREHENSIVE METABOLIC PANEL
ALT: 63 U/L — ABNORMAL HIGH (ref 0–44)
AST: 71 U/L — ABNORMAL HIGH (ref 15–41)
Albumin: 3.2 g/dL — ABNORMAL LOW (ref 3.5–5.0)
Alkaline Phosphatase: 101 U/L (ref 38–126)
Anion gap: 9 (ref 5–15)
BUN: <5 mg/dL — ABNORMAL LOW (ref 6–20)
CO2: 24 mmol/L (ref 22–32)
Calcium: 9.9 mg/dL (ref 8.9–10.3)
Chloride: 104 mmol/L (ref 98–111)
Creatinine, Ser: 0.64 mg/dL (ref 0.44–1.00)
GFR calc Af Amer: >60 mL/min (ref >60)
GFR calc non Af Amer: >60 mL/min (ref >60)
Glucose, Bld: 88 mg/dL (ref 70–99)
Potassium: 4.3 mmol/L (ref 3.5–5.1)
Sodium: 137 mmol/L (ref 135–145)
Total Bilirubin: 0.3 mg/dL (ref 0.3–1.2)
Total Protein: 6.1 g/dL — ABNORMAL LOW (ref 6.5–8.1)

## 2019-03-31 NOTE — TOC Transition Note (Addendum)
Transition of Care [facility]) - CM/SW Discharge Note   Patient Details  Name: Alyssa Little MRN: [id number] Date of Birth: 01-14-1981  [facility]) CM/SW Contact:  Bethena Roys, RN Phone Number: 03/31/2019, 10:02 AM   Clinical Narrative: Plans changed due to patient's insurance being termed. Patient will continue to get IV antibiotics via [facility]- patient will pay out of pocket and medication will be delivered yo patients home on 04-01-19. Patient will get dose here for today prior to transition home. Patient has been educated on how to administer the IV antibiotics in the home herself. CM did set up patient to go to short stay for labs and PICC line care for Friday at 9:00 am. Form for short stay faxed to them for orders.  Patient states she has transportation home. No further needs from Case Manager at this time.      Final next level of care: Home/Self Care Barriers to Discharge: No Barriers Identified   Patient Goals and CMS Choice Patient states their goals for this hospitalization and ongoing recovery are:: "to return home with boyfriend" CMS Medicare.gov Compare Post Acute Care list provided to:: Other (Comment Required)(Patient has [facility] agreeable to referral via ID- patient agreeable to have them provide antibioitcs along with [place].) Choice offered to / list presented to : (Please see above note.)   Discharge Plan and Services In-house Referral: NA Discharge Planning Services: CM Consult Post Acute Care Choice: NA                    HH Arranged: IV Antibiotics HH Agency: [place] ([facility]IV Antibiotics) Date HH Agency Contacted: 03/30/19 Time HH Agency Contacted: 1340 Representative spoke with at [place]: [place] with [facility]  Social Determinants of Health (SDOH) Interventions     Readmission Risk  Interventions No flowsheet data found.

## 2019-03-31 NOTE — Discharge Summary (Signed)
Physician Discharge Summary  Marguerite Barba VEH:[id number] DOB: 07-18-1980 DOA: 03/26/2019  PCP: Carlena Hurl, PA-C  Admit date: 03/26/2019 Discharge date: 03/30/2019  Admitted From: home Disposition:  home  Recommendations for Outpatient Follow-up:  1. Follow up with PCP in 1-2 weeks 2. Please obtain BMP/CBC in one week 3. Please follow up on the following pending results:  Home Health: antibiotics at home Equipment/Devices:   Discharge Condition:stable  CODE STATUS:full  Diet recommendation: normal  Brief/Interim Summary: 38 year old female with a history of alcohol abuse and seizure disorder who was found confused and incoherent.  She fell in the bathtub and had a hematoma on her forehead.  She was found to be subtherapeutic on her antiepileptics.  She was started on Keppra and transition to IV valproic acid.  Additionally, blood cultures were drawn, which showed MSSA bacteremia x1 bottle.  Echo cardiogram showed no vegetations.  Patient was placed on antibiotics, which were gradually weaned to appropriate antibiotic coverage.  Her mentation improved within 48 hours.  As surveillance blood cultures were normal and she has been transitioned back to oral medications, the patient will be discharged on home antibiotics.  She was instructed to follow-up with her primary care physician in 2 weeks.  Precautions with her PICC line were discussed including tampering with or injecting any thing into the PICC line. Patient stayed one extra night in order to coordinate antibiotics outpatient.  Discharge Diagnoses:  Principal Problem:   Bacteremia due to methicillin susceptible Staphylococcus aureus (MSSA) Active Problems:   Seizures (HCC)   ETOH abuse   Altered mental status   Hyperkalemia   Hyponatremia   Abnormal liver function   AMS (altered mental status)   Streptococcal bacteremia    Discharge Instructions  Discharge Instructions    Call MD for:  difficulty  breathing, headache or visual disturbances   Complete by: As directed    Call MD for:  extreme fatigue   Complete by: As directed    Call MD for:  hives   Complete by: As directed    Call MD for:  persistant dizziness or light-headedness   Complete by: As directed    Call MD for:  persistant nausea and vomiting   Complete by: As directed    Call MD for:  redness, tenderness, or signs of infection (pain, swelling, redness, odor or green/yellow discharge around incision site)   Complete by: As directed    Call MD for:  severe uncontrolled pain   Complete by: As directed    Call MD for:  temperature >100.4   Complete by: As directed    Diet - low sodium heart healthy   Complete by: As directed    Home infusion instructions [facility] May follow [place] Dosing Protocol; May administer Cathflo as needed to maintain patency of vascular access device.; Flushing of vascular access device: per [facility] Protocol: 0.9% NaCl pre/post medica...   Complete by: As directed    Instructions: May follow [place] Dosing Protocol   Instructions: May administer Cathflo as needed to maintain patency of vascular access device.   Instructions: Flushing of vascular access device: per [facility] Protocol: 0.9% NaCl pre/post medication administration and prn patency; Heparin 100 u/ml, 51m for implanted ports and Heparin 10u/ml, 533mfor all other central venous catheters.   Instructions: May follow [facility] Anaphylaxis Protocol for First Dose Administration in the home: 0.9% NaCl at 25-50 ml/hr to maintain IV access for protocol meds. Epinephrine 0.3 ml IV/IM PRN and Benadryl  25-50 IV/IM PRN s/s of anaphylaxis.   Instructions: [place] Infusion Coordinator (RN) to assist per patient IV care needs in the home PRN.   Increase activity slowly   Complete by: As directed      Allergies as of 03/30/2019      Reactions   Shellfish Allergy Anaphylaxis   Penicillins Other (See Comments)   Unknown per family  and friend Tolerated cephalosporins including ancef Has patient had a PCN reaction causing immediate rash, facial/tongue/throat swelling, SOB or lightheadedness with hypotension: Unknown Has patient had a PCN reaction causing severe rash involving mucus membranes or skin necrosis: Unknown Has patient had a PCN reaction that required hospitalization: Unknown Has patient had a PCN reaction occurring within the last 10 years: Unknown If all of the above answers are "NO", then may   Shellfish Allergy Other (See Comments)   Unknown per family and friend   Penicillins Hives, Rash      Medication List    STOP taking these medications   doxycycline 100 MG tablet Commonly known as: VIBRA-TABS     TAKE these medications   acetaminophen 500 MG tablet Commonly known as: TYLENOL Take 500 mg by mouth every 6 (six) hours as needed (Headaches and muscle aches.).   cefTRIAXone  IVPB Commonly known as: ROCEPHIN Inject 2 g into the vein daily for 12 days. Indication:  Strep/MSSA bacteremia Last Day of Therapy:  04/11/2019 Labs - Once weekly:  CBC/D and BMP, Labs - Every other week:  ESR and CRP   levETIRAcetam 750 MG tablet Commonly known as: Keppra Take 2 tablets (1,500 mg total) by mouth 2 (two) times daily.   multivitamin with minerals Tabs tablet Take 1 tablet by mouth daily.   phenytoin 125 MG/5ML suspension Commonly known as: DILANTIN Take 6 mLs (150 mg total) by mouth 3 (three) times daily.            Home Infusion Instuctions  (From admission, onward)         Start     Ordered   03/30/19 0000  Home infusion instructions [facility] May follow [place] Dosing Protocol; May administer Cathflo as needed to maintain patency of vascular access device.; Flushing of vascular access device: per [facility] Protocol: 0.9% NaCl pre/post medica...    Question Answer Comment  Instructions May follow [place] Dosing Protocol   Instructions May administer Cathflo as needed to  maintain patency of vascular access device.   Instructions Flushing of vascular access device: per [facility] Protocol: 0.9% NaCl pre/post medication administration and prn patency; Heparin 100 u/ml, 18m for implanted ports and Heparin 10u/ml, 525mfor all other central venous catheters.   Instructions May follow [facility] Anaphylaxis Protocol for First Dose Administration in the home: 0.9% NaCl at 25-50 ml/hr to maintain IV access for protocol meds. Epinephrine 0.3 ml IV/IM PRN and Benadryl 25-50 IV/IM PRN s/s of anaphylaxis.   Instructions [facility] Infusion Coordinator (RN) to assist per patient IV care needs in the home PRN.      03/30/19 1327         Follow-up Information    Tysinger, DaCamelia EngPA-C. Schedule an appointment as soon as possible for a visit in 2 week(s).   Specialty: Family Medicine Contact information: [address]  [place] [phone]        Allergies  Allergen Reactions  . Shellfish Allergy Anaphylaxis  . Penicillins Other (See Comments)    Unknown per family and  friend Tolerated cephalosporins including ancef  Has patient had a PCN reaction causing immediate rash, facial/tongue/throat swelling, SOB or lightheadedness with hypotension: Unknown Has patient had a PCN reaction causing severe rash involving mucus membranes or skin necrosis: Unknown Has patient had a PCN reaction that required hospitalization: Unknown Has patient had a PCN reaction occurring within the last 10 years: Unknown If all of the above answers are "NO", then may  . Shellfish Allergy Other (See Comments)    Unknown per family and friend  . Penicillins Hives and Rash    Consultations:  Infectious disease  Neurology   Procedures/Studies: CT Head Wo Contrast  Result Date: 03/26/2019 CLINICAL DATA:  Altered level of consciousness (LOC), unexplained; Polytrauma, critical, head/C-spine injury suspected Found on bathtub. Hematoma to left side of head. EXAM: CT HEAD  WITHOUT CONTRAST CT CERVICAL SPINE WITHOUT CONTRAST TECHNIQUE: Multidetector CT imaging of the head and cervical spine was performed following the standard protocol without intravenous contrast. Multiplanar CT image reconstructions of the cervical spine were also generated. COMPARISON:  Head CT 09/06/2018. Head and cervical spine CT 02/24/2018 FINDINGS: CT HEAD FINDINGS Brain: No intracranial hemorrhage, mass effect, or midline shift. Previous intracranial hemorrhage has resolved. Minimal encephalomalacia in the anterior right frontal lobe at site of prior hemorrhage. Mild generalized atrophy for age. The basilar cisterns are patent. No evidence of territorial infarct or acute ischemia. No extra-axial or intracranial fluid collection. Vascular: No hyperdense vessel. Skull: No fracture or focal lesion. Sinuses/Orbits: Partially obscured by motion. Grossly negative. Other: Large left frontal scalp hematoma. CT CERVICAL SPINE FINDINGS Alignment: Normal. Skull base and vertebrae: No acute fracture. Cervical vertebral body heights are maintained. Slight loss of height of superior endplate of T1 and T2, appears chronic but new from 02/24/2018. The dens and skull base are intact. Soft tissues and spinal canal: No prevertebral fluid or swelling. No visible canal hematoma. Disc levels:  Preserved. Upper chest: Negative. Other: None. IMPRESSION: 1. Large left frontal scalp hematoma. No acute intracranial abnormality. No skull fracture. 2. No acute fracture or subluxation of the cervical spine. Electronically Signed   By: Keith Rake M.D.   On: 03/26/2019 04:42   CT Cervical Spine Wo Contrast  Result Date: 03/26/2019 CLINICAL DATA:  Altered level of consciousness (LOC), unexplained; Polytrauma, critical, head/C-spine injury suspected Found on bathtub. Hematoma to left side of head. EXAM: CT HEAD WITHOUT CONTRAST CT CERVICAL SPINE WITHOUT CONTRAST TECHNIQUE: Multidetector CT imaging of the head and cervical spine was  performed following the standard protocol without intravenous contrast. Multiplanar CT image reconstructions of the cervical spine were also generated. COMPARISON:  Head CT 09/06/2018. Head and cervical spine CT 02/24/2018 FINDINGS: CT HEAD FINDINGS Brain: No intracranial hemorrhage, mass effect, or midline shift. Previous intracranial hemorrhage has resolved. Minimal encephalomalacia in the anterior right frontal lobe at site of prior hemorrhage. Mild generalized atrophy for age. The basilar cisterns are patent. No evidence of territorial infarct or acute ischemia. No extra-axial or intracranial fluid collection. Vascular: No hyperdense vessel. Skull: No fracture or focal lesion. Sinuses/Orbits: Partially obscured by motion. Grossly negative. Other: Large left frontal scalp hematoma. CT CERVICAL SPINE FINDINGS Alignment: Normal. Skull base and vertebrae: No acute fracture. Cervical vertebral body heights are maintained. Slight loss of height of superior endplate of T1 and T2, appears chronic but new from 02/24/2018. The dens and skull base are intact. Soft tissues and spinal canal: No prevertebral fluid or swelling. No visible canal hematoma. Disc levels:  Preserved. Upper chest: Negative. Other: None.  IMPRESSION: 1. Large left frontal scalp hematoma. No acute intracranial abnormality. No skull fracture. 2. No acute fracture or subluxation of the cervical spine. Electronically Signed   By: Keith Rake M.D.   On: 03/26/2019 04:42   MR BRAIN WO CONTRAST  Result Date: 03/26/2019 CLINICAL DATA:  Altered level of consciousness. Found in bathtub with hematoma on left side of head EXAM: MRI HEAD WITHOUT CONTRAST TECHNIQUE: Multiplanar, multiecho pulse sequences of the brain and surrounding structures were obtained without intravenous contrast. 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 This is presumably related to patient's subarachnoid hemorrhage seen on 2019 head CT. No evidence of acute infarct, acute hemorrhage, hydrocephalus, mass, or collection. There is diminished brain volume greater than expected for age. Small areas of encephalomalacia and gliosis are seen along the cortex/subcortical white matter of the high bifrontal lobes. History of seizure with asymmetric small left hippocampus. Specific hippocampus sequence was not performed and signal comparison is suboptimal. Vascular: Normal flow voids Skull and upper cervical spine: Normal marrow signal Sinuses/Orbits: Large left scalp hemorrhage with swelling Other: There may be asymmetric right tongue atrophy or edema, only clearly covered on coronal T2 weighted imaging. No lesion is seen along the course of the hypoglossal nerve. IMPRESSION: 1. 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 There was sizable subarachnoid hemorrhage seen by CT in 2019. 3. Areas of cortical/subcortical gliosis in the high bifrontal region where there is chronic blood products, presumably posttraumatic. 4. Motion degraded. 5. Right tongue edema versus atrophy, please correlate with exam. Electronically Signed   By: Monte Fantasia M.D.   On: 03/26/2019 10:57   DG Chest Port 1 View  Result Date: 03/26/2019 CLINICAL DATA:  Found down in bathtub. Altered mental status. EXAM: PORTABLE CHEST 1 VIEW COMPARISON:  Radiograph 06/24/2018 FINDINGS: The cardiomediastinal contours are normal. The lungs are clear. Pulmonary vasculature is normal. No consolidation, pleural effusion, or pneumothorax. No acute osseous abnormalities are seen. IMPRESSION: Unremarkable single AP view of the chest. Electronically Signed   By: Keith Rake M.D.   On: 03/26/2019 03:23   DG Abd Portable 1V  Result Date: 03/26/2019 CLINICAL DATA:  Pre MRI screening. EXAM: PORTABLE ABDOMEN - 1 VIEW COMPARISON:  None.  FINDINGS: Bowel gas pattern is nonobstructive. No metallic foreign bodies are visualized. Minimal degenerative change of the hips. IMPRESSION: Nonobstructive bowel gas pattern. No metallic foreign bodies visualized. Electronically Signed   By: Marin Olp M.D.   On: 03/26/2019 07:55   EEG adult  Result Date: 03/26/2019 Lora Havens, MD     03/26/2019  7:03 PM Patient Name: Roxi Hlavaty MRN: [id number] Epilepsy Attending: Lora Havens Referring Physician/Provider: Dr. Kathrynn Speed Date: 03/26/2019 Duration: 23.22 minutes Patient history: 38 year old female history of seizures who presented with altered mental status.  EEG to evaluate for seizures. Level of alertness: Awake/lethargic AEDs during EEG study: Lorazepam, Keppra, phenytoin Technical aspects: This EEG study was done with scalp electrodes positioned according to the 10-20 International system of electrode placement. Electrical activity was acquired at a sampling rate of '500Hz'  and reviewed with a high frequency filter of '70Hz'  and a low frequency filter of '1Hz' . EEG data were recorded continuously and digitally stored. Description: During awake state, no clear posterior dominant rhythm was seen.  EEG showed continuous generalized 2 to 5 Hz theta-delta slowing with  an overriding 15 to 18 Hz, 2-3 uV beta activity with irregular morphology distributed symmetrically and diffusely. Hyperventilation and photic stimulation were not performed. Abnormality -Continuous slow, generalized -Excessive beta, generalized IMPRESSION: This study is suggestive of moderate to severe diffuse encephalopathy, nonspecific to etiology. The excessive beta activity seen in the background is most likely due to the effect of benzodiazepine and is a benign EEG pattern. No seizures or epileptiform discharges were seen throughout the recording. Lora Havens   ECHOCARDIOGRAM COMPLETE  Result Date: 03/29/2019   ECHOCARDIOGRAM REPORT   Patient Name:    [facility] Date of Exam: 03/27/2019 Medical Rec #:  [id number]                Height:       62.0 in Accession #:    [id number]               Weight:       88.4 lb Date of Birth:  1981-02-23                BSA:          1.35 m Patient Age:    38 years                 BP:           103/72 mmHg Patient Gender: F                        HR:           84 bpm. Exam Location:  Inpatient Procedure: 2D Echo Indications:    bacteremia 790.7  History:        Patient has no prior history of Echocardiogram examinations.                 Signs/Symptoms:Altered Mental Status; Risk Factors:alcohol                 abuse.  Sonographer:    Johny Chess Referring Phys: [id number] [place]  1. Left ventricular ejection fraction, by visual estimation, is 55 to 60%. The left ventricle has normal function. There is no left ventricular hypertrophy.  2. The left ventricle has no regional wall motion abnormalities.  3. Global right ventricle has normal systolic function.The right ventricular size is small. No increase in right ventricular wall thickness.  4. Left atrial size was normal.  5. Right atrial size was normal.  6. The mitral valve is normal in structure. No evidence of mitral valve regurgitation. No evidence of mitral stenosis.  7. The tricuspid valve is normal in structure. Tricuspid valve regurgitation is trivial.  8. The aortic valve is tricuspid. Aortic valve regurgitation is not visualized. No evidence of aortic valve sclerosis or stenosis.  9. The pulmonic valve was not well visualized. Pulmonic valve regurgitation is not visualized. 10. Normal pulmonary artery systolic pressure. 11. The inferior vena cava is normal in size with greater than 50% respiratory variability, suggesting right atrial pressure of 3 mmHg. FINDINGS  Left Ventricle: Left ventricular ejection fraction, by visual estimation, is 55 to 60%. The left ventricle has normal function. The left ventricle has no regional wall motion  abnormalities. There is no left ventricular hypertrophy. Left ventricular diastolic parameters were normal. Right Ventricle: The right ventricular size is small. No increase in right ventricular wall thickness. Global RV systolic function is has normal systolic function. The tricuspid regurgitant velocity is 2.02 m/s, and with an  assumed right atrial pressure of 3 mmHg, the estimated right ventricular systolic pressure is normal at 19.3 mmHg. Left Atrium: Left atrial size was normal in size. Right Atrium: Right atrial size was normal in size Pericardium: There is no evidence of pericardial effusion. Mitral Valve: The mitral valve is normal in structure. No evidence of mitral valve regurgitation. No evidence of mitral valve stenosis by observation. Tricuspid Valve: The tricuspid valve is normal in structure. Tricuspid valve regurgitation is trivial. Aortic Valve: The aortic valve is tricuspid. Aortic valve regurgitation is not visualized. The aortic valve is structurally normal, with no evidence of sclerosis or stenosis. Aortic valve mean gradient measures 3.0 mmHg. Aortic valve peak gradient measures 5.8 mmHg. Aortic valve area, by VTI measures 2.07 cm. Pulmonic Valve: The pulmonic valve was not well visualized. Pulmonic valve regurgitation is not visualized. Pulmonic regurgitation is not visualized. No evidence of pulmonic stenosis. Aorta: The aortic root is normal in size and structure. Pulmonary Artery: Indeterminate PASP, inadequate TR jet. Venous: The inferior vena cava is normal in size with greater than 50% respiratory variability, suggesting right atrial pressure of 3 mmHg. IAS/Shunts: No atrial level shunt detected by color flow Doppler.  LEFT VENTRICLE PLAX 2D LVIDd:         3.50 cm  Diastology LVIDs:         2.50 cm  LV e' medial:   7.18 cm/s LV PW:         1.00 cm  LV E/e' medial: 9.6 LV IVS:        0.90 cm LVOT diam:     1.90 cm LV SV:         29 ml LV SV Index:   21.75 LVOT Area:     2.84 cm  RIGHT  VENTRICLE RV S prime:     8.81 cm/s TAPSE (M-mode): 1.6 cm LEFT ATRIUM             Index       RIGHT ATRIUM           Index LA diam:        3.00 cm 2.22 cm/m  RA Area:     10.70 cm LA Vol (A2C):   28.8 ml 21.31 ml/m RA Volume:   24.00 ml  17.76 ml/m LA Vol (A4C):   34.7 ml 25.68 ml/m LA Biplane Vol: 31.9 ml 23.61 ml/m  AORTIC VALVE AV Area (Vmax):    1.94 cm AV Area (Vmean):   1.90 cm AV Area (VTI):     2.07 cm AV Vmax:           120.81 cm/s AV Vmean:          81.383 cm/s AV VTI:            0.198 m AV Peak Grad:      5.8 mmHg AV Mean Grad:      3.0 mmHg LVOT Vmax:         82.57 cm/s LVOT Vmean:        54.481 cm/s LVOT VTI:          0.144 m LVOT/AV VTI ratio: 0.73  AORTA Ao Root diam: 3.00 cm MITRAL VALVE                        TRICUSPID VALVE MV Area (PHT): 3.72 cm             TR Peak grad:   16.3 mmHg MV PHT:  59.16 msec           TR Vmax:        202.00 cm/s MV Decel Time: 204 msec MV E velocity: 68.60 cm/s 103 cm/s  SHUNTS MV A velocity: 78.00 cm/s 70.3 cm/s Systemic VTI:  0.14 m MV E/A ratio:  0.88       1.5       Systemic Diam: 1.90 cm MV A Prime:    10.4 cm/s  Carlyle Dolly MD Electronically signed by Carlyle Dolly MD Signature Date/Time: 03/29/2019/9:49:09 AM    Final    [name] EKG SITE RITE  Result Date: 03/30/2019 If Site Rite image not attached, placement could not be confirmed due to current cardiac rhythm.  US Abdomen Limited RUQ  Result Date: 03/26/2019 CLINICAL DATA:  Abnormal liver function. Additional history provided by scanning technologist: TBI, history of alcohol abuse. EXAM: ULTRASOUND ABDOMEN LIMITED RIGHT UPPER QUADRANT COMPARISON:  No pertinent prior studies available for comparison. FINDINGS: Gallbladder: No gallstones or wall thickening visualized. No sonographic Murphy sign noted by sonographer. Common bile duct: Diameter: 3 mm, within normal limits. Liver: No focal lesion identified. Increased hepatic parenchymal echogenicity.Portal vein is patent on color Doppler  imaging with normal direction of blood flow towards the liver. IMPRESSION: Increased hepatic parenchymal echogenicity. This is a nonspecific finding, most commonly seen in the setting of hepatic steatosis. Otherwise unremarkable right upper quadrant ultrasound, as detailed. Electronically Signed   By: Kellie Simmering DO   On: 03/26/2019 07:23       Subjective:   Discharge Exam: Vitals:   03/30/19 0627 03/30/19 0813  BP: 106/77 105/78  Pulse: 93 95  Resp:  16  Temp: 98.1 F (36.7 C)   SpO2: 98% 98%   Vitals:   03/29/19 1837 03/30/19 0027 03/30/19 0627 03/30/19 0813  BP:  114/86 106/77 105/78  Pulse:  (!) 104 93 95  Resp: 18   16  Temp:  98.5 F (36.9 C) 98.1 F (36.7 C)   TempSrc:  Oral Oral   SpO2:  99% 98% 98%  Weight:   40.6 kg   Height:        General: Pt is alert, awake, not in acute distress Cardiovascular: RRR, S1/S2 +, no rubs, no gallops Respiratory: CTA bilaterally, no wheezing, no rhonchi Abdominal: Soft, NT, ND, bowel sounds + Extremities: no edema, no cyanosis    The results of significant diagnostics from this hospitalization (including imaging, microbiology, ancillary and laboratory) are listed below for reference.     Microbiology: Recent Results (from the past 240 hour(s))  Culture, blood (Routine X 2) w Reflex to ID Panel     Status: Abnormal   Collection Time: 03/26/19 12:10 AM   Specimen: BLOOD LEFT HAND  Result Value Ref Range Status   Specimen Description BLOOD LEFT HAND  Final   Special Requests   Final    BOTTLES DRAWN AEROBIC AND ANAEROBIC Blood Culture adequate volume   Culture  Setup Time   Final    IN BOTH AEROBIC AND ANAEROBIC BOTTLES GRAM POSITIVE COCCI CRITICAL RESULT CALLED TO, READ BACK BY AND VERIFIED WITH: Bronwen [facility] 03/26/19 2001 JDW Performed at [facility], [address] [place], [place] [address]    Culture (A)  Final    STAPHYLOCOCCUS AUREUS STREPTOCOCCUS MITIS/ORALIS    Report Status 03/29/2019 FINAL   Final   Organism ID, Bacteria STAPHYLOCOCCUS AUREUS  Final   Organism ID, Bacteria STREPTOCOCCUS MITIS/ORALIS  Final  Susceptibility   Staphylococcus aureus - MIC*    CIPROFLOXACIN <=0.5 SENSITIVE Sensitive     ERYTHROMYCIN RESISTANT Resistant     GENTAMICIN <=0.5 SENSITIVE Sensitive     OXACILLIN 0.5 SENSITIVE Sensitive     TETRACYCLINE <=1 SENSITIVE Sensitive     VANCOMYCIN <=0.5 SENSITIVE Sensitive     TRIMETH/SULFA <=10 SENSITIVE Sensitive     CLINDAMYCIN RESISTANT Resistant     RIFAMPIN <=0.5 SENSITIVE Sensitive     Inducible Clindamycin POSITIVE Resistant     * STAPHYLOCOCCUS AUREUS   Streptococcus mitis/oralis - MIC*    TETRACYCLINE 0.5 SENSITIVE Sensitive     VANCOMYCIN 0.5 SENSITIVE Sensitive     CLINDAMYCIN <=0.25 SENSITIVE Sensitive     PENICILLIN Value in next row Sensitive      SENSITIVE0.06    CEFTRIAXONE Value in next row Sensitive      SENSITIVE0.12    * STREPTOCOCCUS MITIS/ORALIS  Culture, blood (Routine X 2) w Reflex to ID Panel     Status: None (Preliminary result)   Collection Time: 03/26/19 12:10 AM   Specimen: BLOOD RIGHT FOREARM  Result Value Ref Range Status   Specimen Description BLOOD RIGHT FOREARM  Final   Special Requests   Final    BOTTLES DRAWN AEROBIC AND ANAEROBIC Blood Culture adequate volume   Culture   Final    NO GROWTH 4 DAYS Performed at [facility], [address] [place], [place] [address]    Report Status PENDING  Incomplete  SARS CORONAVIRUS 2 (TAT 6-24 HRS)     Status: None   Collection Time: 03/26/19  1:40 AM  Result Value Ref Range Status   SARS Coronavirus 2 NEGATIVE NEGATIVE Final    Comment: (NOTE) SARS-CoV-2 target nucleic acids are NOT DETECTED. The SARS-CoV-2 RNA is generally detectable in upper and lower respiratory specimens during the acute phase of infection. Negative results do not preclude SARS-CoV-2 infection, do not rule out co-infections with other pathogens, and should not be used as the sole basis  for treatment or other patient management decisions. Negative results must be combined with clinical observations, patient history, and epidemiological information. The expected result is Negative. Fact Sheet for Patients: SugarRoll.be Fact Sheet for Healthcare Providers: https://www.woods-mathews.com/ This test is not yet approved or cleared by the [place] FDA and  has been authorized for detection and/or diagnosis of SARS-CoV-2 by FDA under an Emergency Use Authorization (EUA). This EUA will remain  in effect (meaning this test can be used) for the duration of the COVID-19 declaration under Section 56 4(b)(1) of the Act, 21 U.S.C. section 360bbb-3(b)(1), unless the authorization is terminated or revoked sooner. Performed at [facility], [place] [address], [place], [place] [address]   Culture, blood (routine x 2)     Status: None (Preliminary result)   Collection Time: 03/28/19 10:34 AM   Specimen: BLOOD LEFT HAND  Result Value Ref Range Status   Specimen Description BLOOD LEFT HAND  Final   Special Requests   Final    BOTTLES DRAWN AEROBIC ONLY Blood Culture adequate volume   Culture   Final    NO GROWTH 2 DAYS Performed at [facility], [place] [address], [place], [place] [address]    Report Status PENDING  Incomplete  Culture, blood (routine x 2)     Status: None (Preliminary result)   Collection Time: 03/28/19 10:36 AM   Specimen: BLOOD LEFT ARM  Result Value Ref Range Status   Specimen Description BLOOD  LEFT ARM  Final   Special Requests   Final    BOTTLES DRAWN AEROBIC ONLY Blood Culture results may not be optimal due to an inadequate volume of blood received in culture bottles   Culture   Final    NO GROWTH 2 DAYS Performed at [facility], [place] [address], [place], [place] [address]    Report Status PENDING  Incomplete  Culture, blood (routine x 2)     Status: None (Preliminary result)   Collection Time:  03/28/19  2:00 PM   Specimen: BLOOD  Result Value Ref Range Status   Specimen Description BLOOD RIGHT ANTECUBITAL  Final   Special Requests   Final    BOTTLES DRAWN AEROBIC ONLY Blood Culture adequate volume   Culture   Final    NO GROWTH 2 DAYS Performed at [facility], [place] [address], [place], [place] [address]    Report Status PENDING  Incomplete  Culture, blood (routine x 2)     Status: None (Preliminary result)   Collection Time: 03/28/19  2:08 PM   Specimen: BLOOD RIGHT HAND  Result Value Ref Range Status   Specimen Description BLOOD RIGHT HAND  Final   Special Requests   Final    BOTTLES DRAWN AEROBIC AND ANAEROBIC Blood Culture results may not be optimal due to an inadequate volume of blood received in culture bottles   Culture   Final    NO GROWTH 2 DAYS Performed at [facility], [place] [address] [place], [place] [address]    Report Status PENDING  Incomplete     Labs: BNP (last 3 results) No results for input(s): BNP in the last 8760 hours. Basic Metabolic Panel: Recent Labs  Lab 03/26/19 0651 03/27/19 0502 03/27/19 1434 03/28/19 0324 03/29/19 0333 03/30/19 0510  NA 136 141 135 138 140 140  K 4.3 2.6* 3.2* 3.2* 3.8 4.6  CL 99 103 102 102 108 108  CO2 15* '24 23 25 23 ' 21*  GLUCOSE 124* 75 80 88 84 87  BUN <5* <5* <5* <5* <5* <5*  CREATININE 0.67 0.58 0.46 0.39* 0.40* 0.46  CALCIUM 8.6* 8.9 8.3* 8.6* 8.8* 9.8  MG 2.3  --  1.3* 1.8  --   --    Liver Function Tests: Recent Labs  Lab 03/26/19 0210 03/26/19 0651 03/27/19 0502 03/29/19 0333 03/30/19 0510  AST 277* 209* 148* 249* 113*  ALT 119* 107* 77* 123* 83*  ALKPHOS 147* 141* 121 116 108  BILITOT 3.1* 2.0* 1.6* 1.0 0.2*  PROT 7.0 7.1 6.3* 5.8* 6.1*  ALBUMIN 3.9 3.7 3.2* 3.0* 3.1*   Recent Labs  Lab 03/26/19 0210  LIPASE 23   Recent Labs  Lab 03/26/19 0651  AMMONIA 50*   CBC: Recent Labs  Lab 03/26/19 0247 03/27/19 0502 03/29/19 0333  WBC 15.7* 7.4 5.1  NEUTROABS 14.3*   --   --   HGB 12.0 10.9* 10.6*  HCT 35.2* 32.9* 31.9*  MCV 107.3* 109.7* 109.2*  PLT 199 156 147*   Cardiac Enzymes: Recent Labs  Lab 03/26/19 0651  CKTOTAL 738*  CKMB 10.4*   BNP: Invalid input(s): POCBNP CBG: No results for input(s): GLUCAP in the last 168 hours. D-Dimer No results for input(s): DDIMER in the last 72 hours. Hgb A1c No results for input(s): HGBA1C in the last 72 hours. Lipid Profile No results for input(s): CHOL, HDL, LDLCALC, TRIG, CHOLHDL, LDLDIRECT in the last 72 hours. Thyroid function studies No results for  input(s): TSH, T4TOTAL, T3FREE, THYROIDAB in the last 72 hours.  Invalid input(s): FREET3 Anemia work up No results for input(s): VITAMINB12, FOLATE, FERRITIN, TIBC, IRON, RETICCTPCT in the last 72 hours. Urinalysis    Component Value Date/Time   COLORURINE YELLOW 03/26/2019 [place] 03/26/2019 0213   LABSPEC 1.018 03/26/2019 0213   PHURINE 5.0 03/26/2019 0213   GLUCOSEU 150 (A) 03/26/2019 0213   HGBUR MODERATE (A) 03/26/2019 0213   BILIRUBINUR NEGATIVE 03/26/2019 0213   KETONESUR 80 (A) 03/26/2019 0213   PROTEINUR 100 (A) 03/26/2019 0213   NITRITE NEGATIVE 03/26/2019 0213   LEUKOCYTESUR NEGATIVE 03/26/2019 0213   Sepsis Labs Invalid input(s): PROCALCITONIN,  WBC,  LACTICIDVEN Microbiology Recent Results (from the past 240 hour(s))  Culture, blood (Routine X 2) w Reflex to ID Panel     Status: Abnormal   Collection Time: 03/26/19 12:10 AM   Specimen: BLOOD LEFT HAND  Result Value Ref Range Status   Specimen Description BLOOD LEFT HAND  Final   Special Requests   Final    BOTTLES DRAWN AEROBIC AND ANAEROBIC Blood Culture adequate volume   Culture  Setup Time   Final    IN BOTH AEROBIC AND ANAEROBIC BOTTLES GRAM POSITIVE COCCI CRITICAL RESULT CALLED TO, READ BACK BY AND VERIFIED WITH: Bronwen Betters [facility] 03/26/19 2001 JDW Performed at [facility], [place] [address], [place], [place] [address]    Culture (A)  Final     STAPHYLOCOCCUS AUREUS STREPTOCOCCUS MITIS/ORALIS    Report Status 03/29/2019 FINAL  Final   Organism ID, Bacteria STAPHYLOCOCCUS AUREUS  Final   Organism ID, Bacteria STREPTOCOCCUS MITIS/ORALIS  Final      Susceptibility   Staphylococcus aureus - MIC*    CIPROFLOXACIN <=0.5 SENSITIVE Sensitive     ERYTHROMYCIN RESISTANT Resistant     GENTAMICIN <=0.5 SENSITIVE Sensitive     OXACILLIN 0.5 SENSITIVE Sensitive     TETRACYCLINE <=1 SENSITIVE Sensitive     VANCOMYCIN <=0.5 SENSITIVE Sensitive     TRIMETH/SULFA <=10 SENSITIVE Sensitive     CLINDAMYCIN RESISTANT Resistant     RIFAMPIN <=0.5 SENSITIVE Sensitive     Inducible Clindamycin POSITIVE Resistant     * STAPHYLOCOCCUS AUREUS   Streptococcus mitis/oralis - MIC*    TETRACYCLINE 0.5 SENSITIVE Sensitive     VANCOMYCIN 0.5 SENSITIVE Sensitive     CLINDAMYCIN <=0.25 SENSITIVE Sensitive     PENICILLIN Value in next row Sensitive      SENSITIVE0.06    CEFTRIAXONE Value in next row Sensitive      SENSITIVE0.12    * STREPTOCOCCUS MITIS/ORALIS  Culture, blood (Routine X 2) w Reflex to ID Panel     Status: None (Preliminary result)   Collection Time: 03/26/19 12:10 AM   Specimen: BLOOD RIGHT FOREARM  Result Value Ref Range Status   Specimen Description BLOOD RIGHT FOREARM  Final   Special Requests   Final    BOTTLES DRAWN AEROBIC AND ANAEROBIC Blood Culture adequate volume   Culture   Final    NO GROWTH 4 DAYS Performed at [facility], [address], [place], [place] [address]    Report Status PENDING  Incomplete  SARS CORONAVIRUS 2 (TAT 6-24 HRS)     Status: None   Collection Time: 03/26/19  1:40 AM  Result Value Ref Range Status   SARS Coronavirus 2 NEGATIVE NEGATIVE Final    Comment: (NOTE) SARS-CoV-2 target nucleic acids are NOT DETECTED. The SARS-CoV-2 RNA is generally detectable in  upper and lower respiratory specimens during the acute phase of infection. Negative results do not preclude SARS-CoV-2 infection, do not  rule out co-infections with other pathogens, and should not be used as the sole basis for treatment or other patient management decisions. Negative results must be combined with clinical observations, patient history, and epidemiological information. The expected result is Negative. Fact Sheet for Patients: SugarRoll.be Fact Sheet for Healthcare Providers: https://www.woods-mathews.com/ This test is not yet approved or cleared by the [place] FDA and  has been authorized for detection and/or diagnosis of SARS-CoV-2 by FDA under an Emergency Use Authorization (EUA). This EUA will remain  in effect (meaning this test can be used) for the duration of the COVID-19 declaration under Section 56 4(b)(1) of the Act, 21 U.S.C. section 360bbb-3(b)(1), unless the authorization is terminated or revoked sooner. Performed at [facility], [place] [address], [place], [place] [address]   Culture, blood (routine x 2)     Status: None (Preliminary result)   Collection Time: 03/28/19 10:34 AM   Specimen: BLOOD LEFT HAND  Result Value Ref Range Status   Specimen Description BLOOD LEFT HAND  Final   Special Requests   Final    BOTTLES DRAWN AEROBIC ONLY Blood Culture adequate volume   Culture   Final    NO GROWTH 2 DAYS Performed at [facility], [place] [address], [place], [place] [address]    Report Status PENDING  Incomplete  Culture, blood (routine x 2)     Status: None (Preliminary result)   Collection Time: 03/28/19 10:36 AM   Specimen: BLOOD LEFT ARM  Result Value Ref Range Status   Specimen Description BLOOD LEFT ARM  Final   Special Requests   Final    BOTTLES DRAWN AEROBIC ONLY Blood Culture results may not be optimal due to an inadequate volume of blood received in culture bottles   Culture   Final    NO GROWTH 2 DAYS Performed at [facility], [place] [address] [place], [place] [address]    Report Status PENDING  Incomplete   Culture, blood (routine x 2)     Status: None (Preliminary result)   Collection Time: 03/28/19  2:00 PM   Specimen: BLOOD  Result Value Ref Range Status   Specimen Description BLOOD RIGHT ANTECUBITAL  Final   Special Requests   Final    BOTTLES DRAWN AEROBIC ONLY Blood Culture adequate volume   Culture   Final    NO GROWTH 2 DAYS Performed at [facility], [place] [address] [place], [place] [address]    Report Status PENDING  Incomplete  Culture, blood (routine x 2)     Status: None (Preliminary result)   Collection Time: 03/28/19  2:08 PM   Specimen: BLOOD RIGHT HAND  Result Value Ref Range Status   Specimen Description BLOOD RIGHT HAND  Final   Special Requests   Final    BOTTLES DRAWN AEROBIC AND ANAEROBIC Blood Culture results may not be optimal due to an inadequate volume of blood received in culture bottles   Culture   Final    NO GROWTH 2 DAYS Performed at [facility], [place] [address], [place], [place] [address]    Report Status PENDING  Incomplete     Time coordinating discharge: Over 30 minutes  SIGNED:   Truett Mainland, DO [facility] 03/30/2019, 1:31 PM Pager   If 7PM-7AM, please contact night-coverage [email or web address] Password [email or web address]

## 2019-04-01 SURGERY — ECHOCARDIOGRAM, TRANSESOPHAGEAL
Anesthesia: Monitor Anesthesia Care

## 2019-04-02 ENCOUNTER — Other Ambulatory Visit (HOSPITAL_COMMUNITY): Payer: Self-pay | Admitting: *Deleted

## 2019-04-02 LAB — CULTURE, BLOOD (ROUTINE X 2)
Culture: NO GROWTH
Culture: NO GROWTH
Culture: NO GROWTH
Culture: NO GROWTH
Special Requests: ADEQUATE
Special Requests: ADEQUATE

## 2019-04-03 ENCOUNTER — Other Ambulatory Visit: Payer: Self-pay

## 2019-04-03 ENCOUNTER — Ambulatory Visit (HOSPITAL_COMMUNITY)
Admission: RE | Admit: 2019-04-03 | Discharge: 2019-04-03 | Disposition: A | Discharge status: Home / Self Care | Payer: Self-pay | Source: Ambulatory Visit | Attending: Internal Medicine | Admitting: Internal Medicine

## 2019-04-03 DIAGNOSIS — Z4800 Encounter for change or removal of nonsurgical wound dressing: Secondary | ICD-10-CM | POA: Insufficient documentation

## 2019-04-03 LAB — CBC WITH DIFFERENTIAL/PLATELET
Abs Immature Granulocytes: 0.01 10*3/uL (ref 0.00–0.07)
Basophils Absolute: 0.1 10*3/uL (ref 0.0–0.1)
Basophils Relative: 2 %
Eosinophils Absolute: 0.1 10*3/uL (ref 0.0–0.5)
Eosinophils Relative: 2 %
HCT: 34.3 % — ABNORMAL LOW (ref 36.0–46.0)
Hemoglobin: 11 g/dL — ABNORMAL LOW (ref 12.0–15.0)
Immature Granulocytes: 0 %
Lymphocytes Relative: 26 %
Lymphs Abs: 1.2 10*3/uL (ref 0.7–4.0)
MCH: 36.8 pg — ABNORMAL HIGH (ref 26.0–34.0)
MCHC: 32.1 g/dL (ref 30.0–36.0)
MCV: 114.7 fL — ABNORMAL HIGH (ref 80.0–100.0)
Monocytes Absolute: 1.3 10*3/uL — ABNORMAL HIGH (ref 0.1–1.0)
Monocytes Relative: 28 %
Neutro Abs: 1.9 10*3/uL (ref 1.7–7.7)
Neutrophils Relative %: 42 %
Platelets: 367 10*3/uL (ref 150–400)
RBC: 2.99 MIL/uL — ABNORMAL LOW (ref 3.87–5.11)
RDW: 13.3 % (ref 11.5–15.5)
WBC: 4.6 10*3/uL (ref 4.0–10.5)
nRBC: 0 % (ref 0.0–0.2)

## 2019-04-03 LAB — BASIC METABOLIC PANEL
Anion gap: 9 (ref 5–15)
BUN: <5 mg/dL — ABNORMAL LOW (ref 6–20)
CO2: 24 mmol/L (ref 22–32)
Calcium: 9.3 mg/dL (ref 8.9–10.3)
Chloride: 105 mmol/L (ref 98–111)
Creatinine, Ser: 0.5 mg/dL (ref 0.44–1.00)
GFR calc Af Amer: >60 mL/min (ref >60)
GFR calc non Af Amer: >60 mL/min (ref >60)
Glucose, Bld: 85 mg/dL (ref 70–99)
Potassium: 3.8 mmol/L (ref 3.5–5.1)
Sodium: 138 mmol/L (ref 135–145)

## 2019-04-03 LAB — C-REACTIVE PROTEIN: CRP: 0.8 mg/dL (ref <1.0)

## 2019-04-03 LAB — SEDIMENTATION RATE: Sed Rate: 10 mm/hr (ref 0–22)

## 2019-04-03 MED ORDER — HEPARIN SOD (PORK) LOCK FLUSH 100 UNIT/ML IV SOLN
INTRAVENOUS | Status: AC
  Administered 2019-04-03: 250 [IU]
  Filled 2019-04-03: qty 5

## 2019-04-03 MED ORDER — HEPARIN SOD (PORK) LOCK FLUSH 100 UNIT/ML IV SOLN: 250.0000 [IU] | INTRAVENOUS | Status: DC | PRN

## 2019-04-03 NOTE — Progress Notes (Signed)
Pt here today for picc line dressing change and lab draw.  Pt noted to have several skin tears that look like they were caused from tape prior to being seen in medical day.  Pt's Picc line was a midline that had a stat lock on it our nursing staff had not seen before, and did not have in the department.  I called IR who did not have the equipment we needed to change the stat lock either.  We placed an IV team consult  To come assess the site and see if they had the equipment we needed to change the dressing.  IV team was in a code and unable to come for an hour.  I  called and spoke with them and they were coming after the code to [name].  The patient stated she could not wait anymore, that her boyfriend was already late for work and she had to go so she said she needed to leave and wanted me to just change what I could and change the stat lock next time.  I told her we needed to change the stat lock as part of the dressing change protacal but she would not wait anymore.  I cleaned and changed everything on the PICC line site except the stat lock per her request and the patient left.  She left before I gave her her next appt card so I called her to let her know when she was scheduled to come back to [name] but she did not answer and her voicemail was not set up yet.  I attempted the call again this afternoon and again she did not answer and I could not leave a message.

## 2019-04-08 ENCOUNTER — Telehealth: Payer: Self-pay

## 2019-04-08 NOTE — Telephone Encounter (Signed)
Left patient a voice mail to call back to schedule a Hospital follow up with either Marya Amsler or Dr. Megan Salon. Patient is   currently on IV antibiotics that needs office visit/video visits scheduled.

## 2019-04-09 ENCOUNTER — Other Ambulatory Visit (HOSPITAL_COMMUNITY): Payer: Self-pay | Admitting: *Deleted

## 2019-04-09 ENCOUNTER — Ambulatory Visit (HOSPITAL_COMMUNITY)
Admission: RE | Admit: 2019-04-09 | Discharge: 2019-04-09 | Disposition: A | Discharge status: Home / Self Care | Payer: Self-pay | Source: Ambulatory Visit | Attending: Family Medicine | Admitting: Family Medicine

## 2019-04-09 ENCOUNTER — Other Ambulatory Visit: Payer: Self-pay

## 2019-04-09 DIAGNOSIS — R7881 Bacteremia: Secondary | ICD-10-CM | POA: Insufficient documentation

## 2019-04-09 DIAGNOSIS — B9561 Methicillin susceptible Staphylococcus aureus infection as the cause of diseases classified elsewhere: Secondary | ICD-10-CM | POA: Insufficient documentation

## 2019-04-09 LAB — CBC WITH DIFFERENTIAL/PLATELET
Abs Immature Granulocytes: 0 10*3/uL (ref 0.00–0.07)
Basophils Absolute: 0.3 10*3/uL — ABNORMAL HIGH (ref 0.0–0.1)
Basophils Relative: 3 %
Eosinophils Absolute: 0.1 10*3/uL (ref 0.0–0.5)
Eosinophils Relative: 1 %
HCT: 35.3 % — ABNORMAL LOW (ref 36.0–46.0)
Hemoglobin: 11.3 g/dL — ABNORMAL LOW (ref 12.0–15.0)
Lymphocytes Relative: 14 %
Lymphs Abs: 1.3 10*3/uL (ref 0.7–4.0)
MCH: 36.1 pg — ABNORMAL HIGH (ref 26.0–34.0)
MCHC: 32 g/dL (ref 30.0–36.0)
MCV: 112.8 fL — ABNORMAL HIGH (ref 80.0–100.0)
Monocytes Absolute: 0.7 10*3/uL (ref 0.1–1.0)
Monocytes Relative: 8 %
Neutro Abs: 6.7 10*3/uL (ref 1.7–7.7)
Neutrophils Relative %: 74 %
Platelets: 474 10*3/uL — ABNORMAL HIGH (ref 150–400)
RBC: 3.13 MIL/uL — ABNORMAL LOW (ref 3.87–5.11)
RDW: 13 % (ref 11.5–15.5)
WBC: 9 10*3/uL (ref 4.0–10.5)
nRBC: 0 % (ref 0.0–0.2)
nRBC: 0 /100 WBC

## 2019-04-09 LAB — BASIC METABOLIC PANEL
Anion gap: 11 (ref 5–15)
BUN: <5 mg/dL — ABNORMAL LOW (ref 6–20)
CO2: 26 mmol/L (ref 22–32)
Calcium: 8.8 mg/dL — ABNORMAL LOW (ref 8.9–10.3)
Chloride: 103 mmol/L (ref 98–111)
Creatinine, Ser: 0.61 mg/dL (ref 0.44–1.00)
GFR calc Af Amer: >60 mL/min (ref >60)
GFR calc non Af Amer: >60 mL/min (ref >60)
Glucose, Bld: 79 mg/dL (ref 70–99)
Potassium: 3.7 mmol/L (ref 3.5–5.1)
Sodium: 140 mmol/L (ref 135–145)

## 2019-04-09 MED ORDER — HEPARIN SOD (PORK) LOCK FLUSH 100 UNIT/ML IV SOLN: 250.0000 [IU] | INTRAVENOUS | Status: AC | PRN

## 2019-04-09 MED ORDER — HEPARIN SOD (PORK) LOCK FLUSH 100 UNIT/ML IV SOLN
INTRAVENOUS | Status: AC
  Administered 2019-04-09: 250 [IU]
  Filled 2019-04-09: qty 5

## 2019-04-13 ENCOUNTER — Ambulatory Visit (HOSPITAL_COMMUNITY)
Admission: RE | Admit: 2019-04-13 | Discharge: 2019-04-13 | Disposition: A | Discharge status: Home / Self Care | Payer: Self-pay | Source: Ambulatory Visit | Attending: Family Medicine | Admitting: Family Medicine

## 2019-04-13 ENCOUNTER — Other Ambulatory Visit: Payer: Self-pay

## 2019-04-13 DIAGNOSIS — R7881 Bacteremia: Secondary | ICD-10-CM | POA: Insufficient documentation

## 2019-04-13 NOTE — Progress Notes (Signed)
Spoke with RN and made her aware that the patient has a midline not a picc and the nurse can remove the midline for the patient

## 2019-04-20 ENCOUNTER — Encounter (HOSPITAL_COMMUNITY): Payer: Self-pay
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
## 2020-07-06 IMAGING — DX DG CHEST 1V PORT
1 series · 1 of 1 positions shown · non-contrast
Comparison: 02/24/2018

CLINICAL DATA: Increased oro pharyngeal secretions

EXAM:
PORTABLE CHEST 1 VIEW

[chest ap]
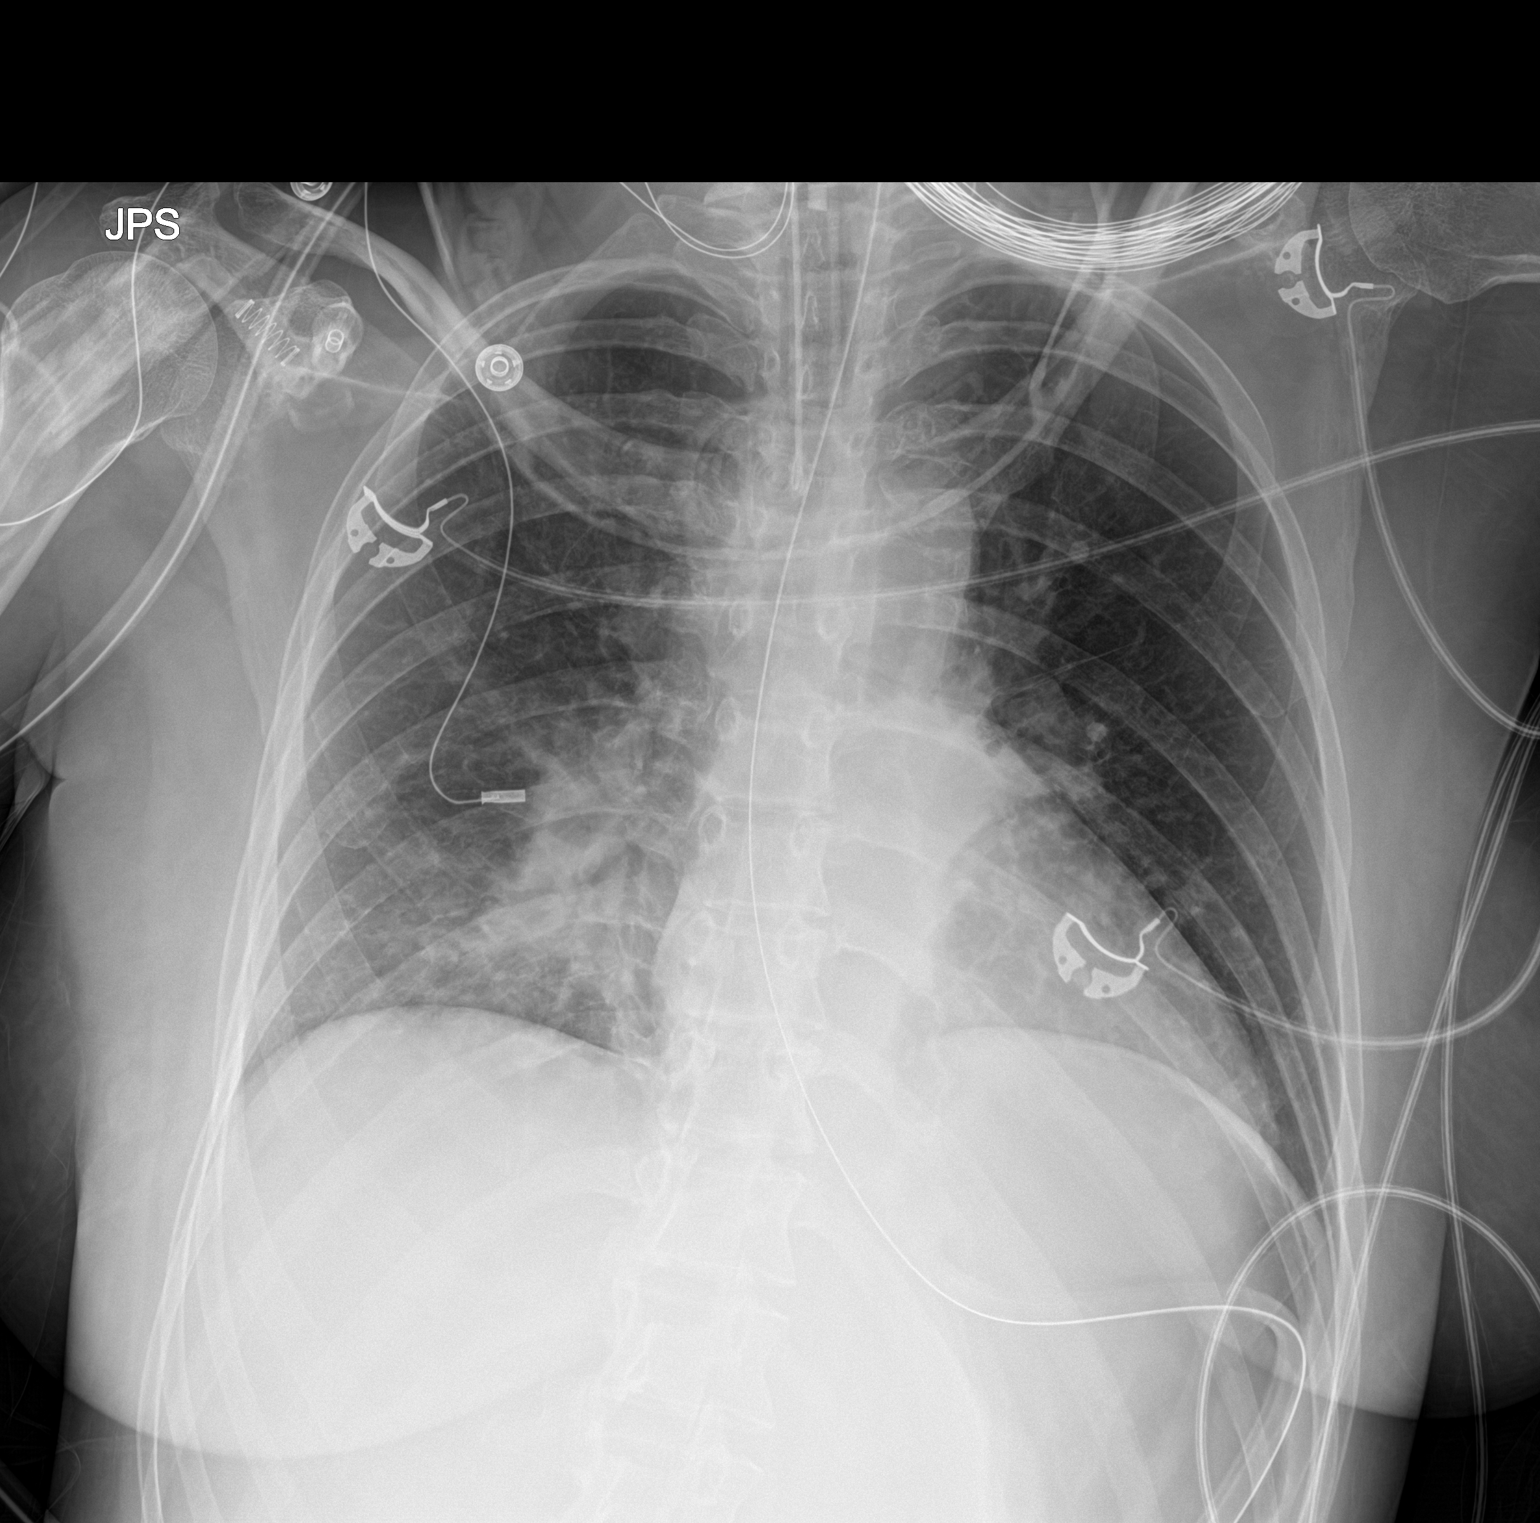

[1 of 1 positions shown; findings below may reference images not displayed]

FINDINGS: Endotracheal tube and NG tube remain in place, unchanged. Patchy
right infrahilar airspace opacity is new since prior study
concerning for infiltrate/pneumonia. No confluent opacity on the
left. Heart is borderline in size. No effusions.
IMPRESSION: Increasing right infrahilar airspace opacity concerning for
pneumonia.

## 2020-07-11 NOTE — Therapy (Signed)
[facility] [address] [place], [place], [address] Phone: [phone]   Fax:  [phone]  Patient Details  Name: Jasdeep Dejarnett MRN: [id number] Date of Birth: 01/16/1981 Referring Provider:  Alger Simons  Encounter Date: 07/11/2020  SPEECH THERAPY DISCHARGE SUMMARY  Visits from Start of Care: 3  Current functional level related to goals / functional outcomes: Pt did not show back to therapy after 04-25-18. See note from 04-25-18 to view those therapy goals. Goals were not met due to only three sessions arrived.  Remaining deficits: Assumed that defiits remain as pt has not been seen since 04-25-18   Education / Equipment: Compensations   Plan: Patient agrees to discharge.  Patient goals were not met. Patient is being discharged due to not returning since the last visit.  ?????         [place] ,[place], CCC-SLP  07/11/2020, 10:20 AM  [facility] [address] [place], [place], [address] Phone: [phone]   Fax:  [phone]

## 2020-11-07 ENCOUNTER — Emergency Department (HOSPITAL_COMMUNITY): Payer: Self-pay

## 2020-11-07 ENCOUNTER — Encounter (HOSPITAL_COMMUNITY): Payer: Self-pay

## 2020-11-07 ENCOUNTER — Other Ambulatory Visit: Payer: Self-pay

## 2020-11-07 ENCOUNTER — Inpatient Hospital Stay (HOSPITAL_COMMUNITY)
Admission: EM | Admit: 2020-11-07 | Discharge: 2020-11-11 | DRG: 101 | Disposition: A | Discharge status: Home / Self Care | Payer: Self-pay | Attending: Internal Medicine | Admitting: Internal Medicine

## 2020-11-07 DIAGNOSIS — S8012XA Contusion of left lower leg, initial encounter: Secondary | ICD-10-CM | POA: Diagnosis present

## 2020-11-07 DIAGNOSIS — Z880 Allergy status to penicillin: Secondary | ICD-10-CM

## 2020-11-07 DIAGNOSIS — S069XAA Unspecified intracranial injury with loss of consciousness status unknown, initial encounter: Secondary | ICD-10-CM | POA: Diagnosis present

## 2020-11-07 DIAGNOSIS — R636 Underweight: Secondary | ICD-10-CM | POA: Diagnosis present

## 2020-11-07 DIAGNOSIS — W1830XA Fall on same level, unspecified, initial encounter: Secondary | ICD-10-CM | POA: Diagnosis present

## 2020-11-07 DIAGNOSIS — E872 Acidosis: Secondary | ICD-10-CM | POA: Diagnosis present

## 2020-11-07 DIAGNOSIS — Z9114 Patient's other noncompliance with medication regimen: Secondary | ICD-10-CM

## 2020-11-07 DIAGNOSIS — R4182 Altered mental status, unspecified: Secondary | ICD-10-CM | POA: Diagnosis present

## 2020-11-07 DIAGNOSIS — L719 Rosacea, unspecified: Secondary | ICD-10-CM | POA: Diagnosis present

## 2020-11-07 DIAGNOSIS — Z20822 Contact with and (suspected) exposure to covid-19: Secondary | ICD-10-CM | POA: Diagnosis present

## 2020-11-07 DIAGNOSIS — G40901 Epilepsy, unspecified, not intractable, with status epilepticus: Secondary | ICD-10-CM

## 2020-11-07 DIAGNOSIS — F17210 Nicotine dependence, cigarettes, uncomplicated: Secondary | ICD-10-CM | POA: Diagnosis present

## 2020-11-07 DIAGNOSIS — S8011XA Contusion of right lower leg, initial encounter: Secondary | ICD-10-CM | POA: Diagnosis present

## 2020-11-07 DIAGNOSIS — G40909 Epilepsy, unspecified, not intractable, without status epilepticus: Principal | ICD-10-CM

## 2020-11-07 DIAGNOSIS — Z87820 Personal history of traumatic brain injury: Secondary | ICD-10-CM | POA: Diagnosis present

## 2020-11-07 DIAGNOSIS — S2241XD Multiple fractures of ribs, right side, subsequent encounter for fracture with routine healing: Secondary | ICD-10-CM

## 2020-11-07 DIAGNOSIS — R6510 Systemic inflammatory response syndrome (SIRS) of non-infectious origin without acute organ dysfunction: Secondary | ICD-10-CM | POA: Diagnosis present

## 2020-11-07 DIAGNOSIS — F1090 Alcohol use, unspecified, uncomplicated: Secondary | ICD-10-CM | POA: Diagnosis present

## 2020-11-07 DIAGNOSIS — R569 Unspecified convulsions: Secondary | ICD-10-CM

## 2020-11-07 DIAGNOSIS — E8729 Other acidosis: Secondary | ICD-10-CM | POA: Diagnosis present

## 2020-11-07 DIAGNOSIS — F1010 Alcohol abuse, uncomplicated: Secondary | ICD-10-CM | POA: Diagnosis present

## 2020-11-07 DIAGNOSIS — F10239 Alcohol dependence with withdrawal, unspecified: Secondary | ICD-10-CM | POA: Diagnosis present

## 2020-11-07 DIAGNOSIS — Z91013 Allergy to seafood: Secondary | ICD-10-CM

## 2020-11-07 DIAGNOSIS — S0003XA Contusion of scalp, initial encounter: Secondary | ICD-10-CM | POA: Diagnosis present

## 2020-11-07 DIAGNOSIS — S069X9A Unspecified intracranial injury with loss of consciousness of unspecified duration, initial encounter: Secondary | ICD-10-CM | POA: Diagnosis present

## 2020-11-07 DIAGNOSIS — Z681 Body mass index (BMI) 19 or less, adult: Secondary | ICD-10-CM

## 2020-11-07 DIAGNOSIS — E876 Hypokalemia: Secondary | ICD-10-CM | POA: Diagnosis present

## 2020-11-07 LAB — PROTIME-INR
INR: 0.9 (ref 0.8–1.2)
Prothrombin Time: 12.2 seconds (ref 11.4–15.2)

## 2020-11-07 LAB — CBC WITH DIFFERENTIAL/PLATELET
Abs Immature Granulocytes: 0.04 10*3/uL (ref 0.00–0.07)
Basophils Absolute: 0 10*3/uL (ref 0.0–0.1)
Basophils Relative: 0 %
Eosinophils Absolute: 0 10*3/uL (ref 0.0–0.5)
Eosinophils Relative: 0 %
HCT: 38.4 % (ref 36.0–46.0)
Hemoglobin: 13.2 g/dL (ref 12.0–15.0)
Immature Granulocytes: 0 %
Lymphocytes Relative: 5 %
Lymphs Abs: 0.5 10*3/uL — ABNORMAL LOW (ref 0.7–4.0)
MCH: 36.4 pg — ABNORMAL HIGH (ref 26.0–34.0)
MCHC: 34.4 g/dL (ref 30.0–36.0)
MCV: 105.8 fL — ABNORMAL HIGH (ref 80.0–100.0)
Monocytes Absolute: 0.7 10*3/uL (ref 0.1–1.0)
Monocytes Relative: 7 %
Neutro Abs: 9.6 10*3/uL — ABNORMAL HIGH (ref 1.7–7.7)
Neutrophils Relative %: 88 %
Platelets: 185 10*3/uL (ref 150–400)
RBC: 3.63 MIL/uL — ABNORMAL LOW (ref 3.87–5.11)
RDW: 14.2 % (ref 11.5–15.5)
WBC: 10.9 10*3/uL — ABNORMAL HIGH (ref 4.0–10.5)
nRBC: 0 % (ref 0.0–0.2)

## 2020-11-07 LAB — RESP PANEL BY RT-PCR (FLU A&B, COVID) ARPGX2
Influenza A by PCR: NEGATIVE
Influenza B by PCR: NEGATIVE
SARS Coronavirus 2 by RT PCR: NEGATIVE

## 2020-11-07 LAB — COMPREHENSIVE METABOLIC PANEL
ALT: 25 U/L (ref 0–44)
AST: 45 U/L — ABNORMAL HIGH (ref 15–41)
Albumin: 4.1 g/dL (ref 3.5–5.0)
Alkaline Phosphatase: 94 U/L (ref 38–126)
Anion gap: 19 — ABNORMAL HIGH (ref 5–15)
BUN: 7 mg/dL (ref 6–20)
CO2: 23 mmol/L (ref 22–32)
Calcium: 8.9 mg/dL (ref 8.9–10.3)
Chloride: 95 mmol/L — ABNORMAL LOW (ref 98–111)
Creatinine, Ser: 0.48 mg/dL (ref 0.44–1.00)
GFR, Estimated: >60 mL/min (ref >60)
Glucose, Bld: 114 mg/dL — ABNORMAL HIGH (ref 70–99)
Potassium: 3.2 mmol/L — ABNORMAL LOW (ref 3.5–5.1)
Sodium: 137 mmol/L (ref 135–145)
Total Bilirubin: 1.2 mg/dL (ref 0.3–1.2)
Total Protein: 7.9 g/dL (ref 6.5–8.1)

## 2020-11-07 LAB — I-STAT BETA HCG BLOOD, ED (MC, WL, AP ONLY): I-stat hCG, quantitative: <5 m[IU]/mL (ref <5)

## 2020-11-07 LAB — URINALYSIS, ROUTINE W REFLEX MICROSCOPIC
Bacteria, UA: NONE SEEN
Bilirubin Urine: NEGATIVE
Glucose, UA: NEGATIVE mg/dL
Ketones, ur: 80 mg/dL — AB
Leukocytes,Ua: NEGATIVE
Nitrite: NEGATIVE
Protein, ur: 100 mg/dL — AB
Specific Gravity, Urine: 1.018 (ref 1.005–1.030)
pH: 6 (ref 5.0–8.0)

## 2020-11-07 LAB — LACTIC ACID, PLASMA: Lactic Acid, Venous: 1.8 mmol/L (ref 0.5–1.9)

## 2020-11-07 LAB — APTT: aPTT: 31 seconds (ref 24–36)

## 2020-11-07 LAB — CK: Total CK: 550 U/L — ABNORMAL HIGH (ref 38–234)

## 2020-11-07 LAB — RAPID URINE DRUG SCREEN, HOSP PERFORMED
Amphetamines: NOT DETECTED
Barbiturates: NOT DETECTED
Benzodiazepines: NOT DETECTED
Cocaine: NOT DETECTED
Opiates: NOT DETECTED
Tetrahydrocannabinol: NOT DETECTED

## 2020-11-07 LAB — CBG MONITORING, ED: Glucose-Capillary: 122 mg/dL — ABNORMAL HIGH (ref 70–99)

## 2020-11-07 LAB — ETHANOL: Alcohol, Ethyl (B): <10 mg/dL (ref <10)

## 2020-11-07 MED ORDER — ADULT MULTIVITAMIN W/MINERALS CH
1.0000 | ORAL_TABLET | Freq: Every day | ORAL | Status: DC
  Administered 2020-11-09 – 2020-11-11 (×3): 1 via ORAL
  Filled 2020-11-07 (×3): qty 1

## 2020-11-07 MED ORDER — IOHEXOL 350 MG/ML SOLN
80.0000 mL | Freq: Once | INTRAVENOUS | Status: AC | PRN
  Administered 2020-11-07: 80 mL via INTRAVENOUS

## 2020-11-07 MED ORDER — THIAMINE HCL 100 MG PO TABS
100.0000 mg | ORAL_TABLET | Freq: Every day | ORAL | Status: DC
  Administered 2020-11-10 – 2020-11-11 (×2): 100 mg via ORAL
  Filled 2020-11-07 (×3): qty 1

## 2020-11-07 MED ORDER — SODIUM CHLORIDE 0.9 % IV SOLN
2.0000 g | Freq: Once | INTRAVENOUS | Status: DC
  Filled 2020-11-07: qty 2

## 2020-11-07 MED ORDER — DEXTROSE-NACL 5-0.45 % IV SOLN: INTRAVENOUS | Status: DC

## 2020-11-07 MED ORDER — SODIUM CHLORIDE 0.9 % IV SOLN
20.0000 mg/kg | Freq: Once | INTRAVENOUS | Status: AC
  Administered 2020-11-08: 816 mg via INTRAVENOUS
  Filled 2020-11-07: qty 16.32

## 2020-11-07 MED ORDER — THIAMINE HCL 100 MG/ML IJ SOLN
100.0000 mg | Freq: Every day | INTRAMUSCULAR | Status: DC
  Administered 2020-11-08 – 2020-11-09 (×2): 100 mg via INTRAVENOUS
  Filled 2020-11-07 (×3): qty 2

## 2020-11-07 MED ORDER — LORAZEPAM 1 MG PO TABS: 1.0000 mg | ORAL_TABLET | ORAL | Status: DC | PRN

## 2020-11-07 MED ORDER — LORAZEPAM 2 MG/ML IJ SOLN
1.0000 mg | Freq: Once | INTRAMUSCULAR | Status: AC
  Administered 2020-11-07: 1 mg via INTRAVENOUS
  Filled 2020-11-07: qty 1

## 2020-11-07 MED ORDER — VANCOMYCIN HCL IN DEXTROSE 1-5 GM/200ML-% IV SOLN
1000.0000 mg | Freq: Once | INTRAVENOUS | Status: AC
  Administered 2020-11-08: 1000 mg via INTRAVENOUS
  Filled 2020-11-07: qty 200

## 2020-11-07 MED ORDER — SODIUM CHLORIDE 0.9 % IV BOLUS
1000.0000 mL | Freq: Once | INTRAVENOUS | Status: AC
  Administered 2020-11-07: 1000 mL via INTRAVENOUS

## 2020-11-07 MED ORDER — METRONIDAZOLE 500 MG/100ML IV SOLN
500.0000 mg | Freq: Once | INTRAVENOUS | Status: AC
  Administered 2020-11-07: 500 mg via INTRAVENOUS
  Filled 2020-11-07: qty 100

## 2020-11-07 MED ORDER — LEVETIRACETAM IN NACL 1000 MG/100ML IV SOLN
1000.0000 mg | Freq: Once | INTRAVENOUS | Status: AC
  Administered 2020-11-07: 1000 mg via INTRAVENOUS
  Filled 2020-11-07: qty 100

## 2020-11-07 MED ORDER — FOLIC ACID 1 MG PO TABS
1.0000 mg | ORAL_TABLET | Freq: Every day | ORAL | Status: DC
  Administered 2020-11-09 – 2020-11-11 (×3): 1 mg via ORAL
  Filled 2020-11-07 (×3): qty 1

## 2020-11-07 MED ORDER — LORAZEPAM 2 MG/ML IJ SOLN
1.0000 mg | Freq: Once | INTRAMUSCULAR | Status: AC
  Administered 2020-11-07: 1 mg via INTRAVENOUS

## 2020-11-07 MED ORDER — LORAZEPAM 2 MG/ML IJ SOLN
1.0000 mg | INTRAMUSCULAR | Status: DC | PRN
  Administered 2020-11-08: 4 mg via INTRAVENOUS
  Administered 2020-11-08: 2 mg via INTRAVENOUS
  Administered 2020-11-08: 4 mg via INTRAVENOUS
  Administered 2020-11-08 (×3): 2 mg via INTRAVENOUS
  Filled 2020-11-07 (×3): qty 1
  Filled 2020-11-07 (×2): qty 2
  Filled 2020-11-07: qty 1

## 2020-11-07 MED ORDER — HALOPERIDOL LACTATE 5 MG/ML IJ SOLN
2.0000 mg | Freq: Once | INTRAMUSCULAR | Status: AC
  Administered 2020-11-08: 2 mg via INTRAVENOUS
  Filled 2020-11-07: qty 1

## 2020-11-07 MED ORDER — POTASSIUM CHLORIDE 10 MEQ/100ML IV SOLN
10.0000 meq | INTRAVENOUS | Status: AC
  Administered 2020-11-08 (×3): 10 meq via INTRAVENOUS
  Filled 2020-11-07 (×2): qty 100

## 2020-11-07 NOTE — Sepsis Progress Note (Signed)
Following per sepsis protocol   

## 2020-11-07 NOTE — ED Provider Notes (Signed)
[facility]-EMERGENCY DEPT Provider Note   CSN: [id number] Arrival date & time: 11/07/20  2031     History Chief Complaint  Patient presents with   Seizures    Sherrilynn Gudgel is a 40 y.o. female.  Patient had a seizure today around 1030.  Her boyfriend said that she eventually got back to normal and he left and when he came back he found her on the floor confused.  Patient has a history of EtOH abuse seizures and traumatic brain injury.  Paramedics stated that patient was alert but would not follow commands  The history is provided by a relative and the EMS personnel.  Seizures Seizure activity on arrival: yes   Seizure type: Unknown. Preceding symptoms comment:  Unknown Initial focality: Unknown. Episode characteristics: abnormal movements   Postictal symptoms: confusion   Return to baseline: no   Severity:  Severe Timing: Unknown. Progression:  Unchanged Context: not cerebral palsy   PTA treatment:  None     Past Medical History:  Diagnosis Date   Alcohol abuse    SAH (subarachnoid hemorrhage) (HCC)    Seizure disorder (HCC)    TBI (traumatic brain injury) (HCC)     Patient Active Problem List   Diagnosis Date Noted   Streptococcal bacteremia 03/30/2019   AMS (altered mental status) 03/27/2019   Bacteremia due to methicillin susceptible Staphylococcus aureus (MSSA) 03/27/2019   Altered mental status 03/26/2019   Hyperkalemia 03/26/2019   Hyponatremia 03/26/2019   Abnormal liver function 03/26/2019   Vitamin deficiency 09/29/2018   Malnutrition (HCC) 09/29/2018   Underweight 09/29/2018   Alcohol use 09/29/2018   Diarrhea 09/29/2018   Secondary amenorrhea 09/29/2018   Rosacea 09/29/2018   Mood change 09/29/2018   Seizure-like activity (HCC) 09/29/2018   Cough 09/29/2018   Elevated liver enzymes 09/29/2018   Anemia 09/29/2018   Encephalopathy acute 06/24/2018   Lactic acidemia 06/24/2018   Open scalp wound 04/22/2018    Transaminitis    TBI (traumatic brain injury) (HCC) 03/05/2018   ETOH abuse    Tachypnea    Traumatic brain injury with loss of consciousness (HCC)    Sinus tachycardia    Seizures (HCC)    Acute blood loss anemia    Malnutrition of moderate degree 02/26/2018   SAH (subarachnoid hemorrhage) (HCC) 02/24/2018    Past Surgical History:  Procedure Laterality Date   CHOLECYSTECTOMY       OB History   No obstetric history on file.     History reviewed. No pertinent family history.  Social History   Tobacco Use   Smoking status: Every Day    Packs/day: 0.25    Types: Cigarettes   Smokeless tobacco: Never  Substance Use Topics   Alcohol use: Yes    Alcohol/week: 1.0 standard drink    Types: 1 Glasses of wine per week    Comment: 1 drink per day   Drug use: Never    Home Medications Prior to Admission medications   Medication Sig Start Date End Date Taking? Authorizing Provider  acetaminophen (TYLENOL) 500 MG tablet Take 500 mg by mouth every 6 (six) hours as needed (Headaches and muscle aches.).    [provider]  levETIRAcetam (KEPPRA) 750 MG tablet Take 2 tablets (1,500 mg total) by mouth 2 (two) times daily. Patient not taking: Reported on 03/28/2019 09/07/18   Garlon Hatchet, PA-C  Multiple Vitamin (MULTIVITAMIN WITH MINERALS) TABS tablet Take 1 tablet by mouth daily. Patient not taking: Reported on 03/28/2019  03/11/18   Angiulli, Mcarthur Rossetti, PA-C  phenytoin (DILANTIN) 125 MG/5ML suspension Take 6 mLs (150 mg total) by mouth 3 (three) times daily. Patient not taking: Reported on 09/06/2018 03/11/18   Charlton Amor, PA-C    Allergies    Shellfish allergy, Penicillins, Shellfish allergy, and Penicillins  Review of Systems   Review of Systems  Unable to perform ROS: Mental status change  Neurological:  Positive for seizures.   Physical Exam Updated Vital Signs BP (!) 173/104 (BP Location: Right Arm)   Pulse 97   Temp (!) 100.5 F (38.1 C) (Rectal)    Resp 18   Ht 5\' 2"  (1.575 m)   Wt 40.8 kg   SpO2 97%   BMI 16.46 kg/m   Physical Exam Vitals and nursing note reviewed.  Constitutional:      Appearance: She is well-developed.     Comments: Patient awake and alert but not following any commands or speaking  HENT:     Head: Normocephalic.     Nose: Nose normal.  Eyes:     General: No scleral icterus.    Conjunctiva/sclera: Conjunctivae normal.  Neck:     Thyroid: No thyromegaly.  Cardiovascular:     Rate and Rhythm: Normal rate and regular rhythm.     Heart sounds: No murmur heard.   No friction rub. No gallop.  Pulmonary:     Breath sounds: No stridor. No wheezing or rales.  Chest:     Chest wall: No tenderness.  Abdominal:     General: There is no distension.     Tenderness: There is no abdominal tenderness. There is no rebound.  Musculoskeletal:        General: Normal range of motion.     Cervical back: Neck supple.     Comments: Bruising to both knees  Lymphadenopathy:     Cervical: No cervical adenopathy.  Skin:    Findings: No erythema or rash.  Neurological:     Motor: No abnormal muscle tone.     Coordination: Coordination normal.     Comments: Patient awake and alert but will not answer questions or follow commands    ED Results / Procedures / Treatments   Labs (all labs ordered are listed, but only abnormal results are displayed) Labs Reviewed  URINALYSIS, ROUTINE W REFLEX MICROSCOPIC - Abnormal; Notable for the following components:      Result Value   Hgb urine dipstick MODERATE (*)    Ketones, ur 80 (*)    Protein, ur 100 (*)    All other components within normal limits  CBG MONITORING, ED - Abnormal; Notable for the following components:   Glucose-Capillary 122 (*)    All other components within normal limits  RESP PANEL BY RT-PCR (FLU A&B, COVID) [id number]  CULTURE, BLOOD (ROUTINE X 2)  CULTURE, BLOOD (ROUTINE X 2)  PROTIME-INR  APTT  CBC WITH DIFFERENTIAL/PLATELET  COMPREHENSIVE METABOLIC  PANEL  CK  ETHANOL  RAPID URINE DRUG SCREEN, HOSP PERFORMED  LACTIC ACID, PLASMA  LACTIC ACID, PLASMA  I-STAT BETA HCG BLOOD, ED ([facility], [facility], [facility] ONLY)    EKG None  Radiology CT Head Wo Contrast  Result Date: 11/07/2020 CLINICAL DATA:  Witnessed seizure, persisting confusion EXAM: CT HEAD WITHOUT CONTRAST CT CERVICAL SPINE WITHOUT CONTRAST TECHNIQUE: Multidetector CT imaging of the head and cervical spine was performed following the standard protocol without intravenous contrast. Multiplanar CT image reconstructions of the cervical spine were also generated. COMPARISON:  CT head 03/05/2018, CT  head and cervical spine 03/26/2019 FINDINGS: CT HEAD FINDINGS Brain: Remote encephalomalacia in the right anterior frontal lobe/superior frontal gyrus compatible with site of prior hemorrhage. No evidence of acute infarction, hemorrhage, hydrocephalus, extra-axial collection, visible mass lesion or mass effect. Slightly age advanced generalized parenchymal atrophy. Basal cisterns are patent. Midline intracranial structures are unremarkable. Vascular: No hyperdense vessel or unexpected calcification. Skull: Multiple areas of scalp thickening noted in the left frontal, bilateral parietal and right occipital scalp. No subjacent calvarial fracture or acute osseous injury is seen. Sinuses/Orbits: Paranasal sinuses and mastoid air cells are predominantly clear. Included orbital structures are unremarkable. Other: None CT CERVICAL SPINE FINDINGS Alignment: Straightening and slight reversal the normal cervical lordosis. Possibly related to cervical flexion noted on scout view. No evidence of traumatic listhesis. No abnormally widened, perched or jumped facets. Normal alignment of the craniocervical and atlantoaxial articulations. Skull base and vertebrae: Motion artifact may limit detection of subtle injury and mimic cortical step-off, particularly involving the C2-C6 levels. No acute skull base fracture. No vertebral body  fracture or height loss. Normal bone mineralization. No worrisome osseous lesions. Soft tissues and spinal canal: No pre or paravertebral fluid or swelling. No visible canal hematoma. Airways patent. No worrisome cervical adenopathy. Disc levels: No significant central canal or foraminal stenosis identified within the imaged levels of the spine. Upper chest: No acute abnormality in the upper chest or imaged lung apices. Other: Normal thyroid. IMPRESSION: Multiple sites of scalp swelling and thickening, correlate for contusive changes. No calvarial fracture. No acute intracranial abnormality. Focus of encephalomalacia in the right anterior frontal lobe/superior frontal gyrus compatible with site of remote hemorrhage. Slightly age advanced parenchymal volume loss. No acute cervical spine fracture or traumatic listhesis. Evaluation limited by motion artifact, most pronounced C3-C6. Electronically Signed   By: Kreg Shropshire M.D.   On: 11/07/2020 21:44   CT Cervical Spine Wo Contrast  Result Date: 11/07/2020 CLINICAL DATA:  Witnessed seizure, persisting confusion EXAM: CT HEAD WITHOUT CONTRAST CT CERVICAL SPINE WITHOUT CONTRAST TECHNIQUE: Multidetector CT imaging of the head and cervical spine was performed following the standard protocol without intravenous contrast. Multiplanar CT image reconstructions of the cervical spine were also generated. COMPARISON:  CT head 03/05/2018, CT head and cervical spine 03/26/2019 FINDINGS: CT HEAD FINDINGS Brain: Remote encephalomalacia in the right anterior frontal lobe/superior frontal gyrus compatible with site of prior hemorrhage. No evidence of acute infarction, hemorrhage, hydrocephalus, extra-axial collection, visible mass lesion or mass effect. Slightly age advanced generalized parenchymal atrophy. Basal cisterns are patent. Midline intracranial structures are unremarkable. Vascular: No hyperdense vessel or unexpected calcification. Skull: Multiple areas of scalp thickening  noted in the left frontal, bilateral parietal and right occipital scalp. No subjacent calvarial fracture or acute osseous injury is seen. Sinuses/Orbits: Paranasal sinuses and mastoid air cells are predominantly clear. Included orbital structures are unremarkable. Other: None CT CERVICAL SPINE FINDINGS Alignment: Straightening and slight reversal the normal cervical lordosis. Possibly related to cervical flexion noted on scout view. No evidence of traumatic listhesis. No abnormally widened, perched or jumped facets. Normal alignment of the craniocervical and atlantoaxial articulations. Skull base and vertebrae: Motion artifact may limit detection of subtle injury and mimic cortical step-off, particularly involving the C2-C6 levels. No acute skull base fracture. No vertebral body fracture or height loss. Normal bone mineralization. No worrisome osseous lesions. Soft tissues and spinal canal: No pre or paravertebral fluid or swelling. No visible canal hematoma. Airways patent. No worrisome cervical adenopathy. Disc levels: No significant central canal or  foraminal stenosis identified within the imaged levels of the spine. Upper chest: No acute abnormality in the upper chest or imaged lung apices. Other: Normal thyroid. IMPRESSION: Multiple sites of scalp swelling and thickening, correlate for contusive changes. No calvarial fracture. No acute intracranial abnormality. Focus of encephalomalacia in the right anterior frontal lobe/superior frontal gyrus compatible with site of remote hemorrhage. Slightly age advanced parenchymal volume loss. No acute cervical spine fracture or traumatic listhesis. Evaluation limited by motion artifact, most pronounced C3-C6. Electronically Signed   By: Kreg Shropshire M.D.   On: 11/07/2020 21:44   DG Pelvis Portable  Result Date: 11/07/2020 CLINICAL DATA:  Status post fall. EXAM: PORTABLE PELVIS 1-2 VIEWS COMPARISON:  None. FINDINGS: There is no evidence of pelvic fracture or diastasis.  No pelvic bone lesions are seen. IMPRESSION: Negative. Electronically Signed   By: Aram Candela M.D.   On: 11/07/2020 22:33   DG Chest Port 1 View  Result Date: 11/07/2020 CLINICAL DATA:  We, seizures EXAM: PORTABLE CHEST 1 VIEW COMPARISON:  03/26/2019 FINDINGS: The patient is rotated resulting in a widened appearance of the mediastinum. There is no focal airspace consolidation. There are multiple rib injury bilaterally, including the anterior left third and fourth ribs on the left, and the posterolateral sixth through eighth ribs. There is no visible pneumothorax. No large pleural effusion. IMPRESSION: Multiple bilateral rib fractures including ribs 3 and 4 on the left and ribs 6 through 8 on the right. No visible pneumothorax. No focal airspace disease. Electronically Signed   By: Caprice Renshaw   On: 11/07/2020 21:41    Procedures Procedures   Medications Ordered in ED Medications  levETIRAcetam (KEPPRA) IVPB 1000 mg/100 mL premix (1,000 mg Intravenous New Bag/Given 11/07/20 2232)  aztreonam (AZACTAM) 2 g in sodium chloride 0.9 % 100 mL IVPB (has no administration in time range)  vancomycin (VANCOCIN) IVPB 1000 mg/200 mL premix (has no administration in time range)  metroNIDAZOLE (FLAGYL) IVPB 500 mg (has no administration in time range)  iohexol (OMNIPAQUE) 350 MG/ML injection 80 mL (has no administration in time range)  LORazepam (ATIVAN) injection 1 mg (has no administration in time range)  sodium chloride 0.9 % bolus 1,000 mL (1,000 mLs Intravenous New Bag/Given 11/07/20 2227)  sodium chloride 0.9 % bolus 1,000 mL (1,000 mLs Intravenous New Bag/Given 11/07/20 2227)  LORazepam (ATIVAN) injection 1 mg (1 mg Intravenous Given 11/07/20 2205)   CRITICAL CARE Performed by: Bethann Berkshire Total critical care time: 65 minutes Critical care time was exclusive of separately billable procedures and treating other patients. Critical care was necessary to treat or prevent imminent or life-threatening  deterioration. Critical care was time spent personally by me on the following activities: development of treatment plan with patient and/or surrogate as well as nursing, discussions with consultants, evaluation of patient's response to treatment, examination of patient, obtaining history from patient or surrogate, ordering and performing treatments and interventions, ordering and review of laboratory studies, ordering and review of radiographic studies, pulse oximetry and re-evaluation of patient's condition. Patient was started on Keppra for seizures.  Septic protocol was started.  Patient has numerous broken ribs so CT chest and abdomen have been done. ED Course  I have reviewed the triage vital signs and the nursing notes.  Pertinent labs & imaging results that were available during my care of the patient were reviewed by me and considered in my medical decision making (see chart for details).  I spoke with Dr. Wilford Corner and he stated to start the  patient on fosphenytoin and he will see the patient MDM Rules/Calculators/A&P                           Seizure and postictal state most likely rib fractures occurred from fall possibly old..  pt will be admitted to medicine Final Clinical Impression(s) / ED Diagnoses Final diagnoses:  None    Rx / DC Orders ED Discharge Orders     None        Bethann BerkshireZammit, Kinzee Happel, MD 11/08/20 1702

## 2020-11-07 NOTE — Consult Note (Signed)
Neurology Consultation  Reason for Consult: Seizures Referring Physician: Dr. Estell Harpin  CC: Seizure, altered mental status  History is obtained from: Patient's chart, boyfriend  HPI: Alyssa Little is a 40 y.o. female past medical history of TBI, seizure disorder, past history of subarachnoid hemorrhage, alcohol abuse, noncompliance to medication, last known well somewhere around 1030 this morning when she had a brief 4 to 5 minutes seizure witnessed by the boyfriend after which she came around and he left her in the bed to rest, and came back around 8 PM and found her down on the ground by the kitchen-no witnessed activity in the afternoon.  She was brought in for evaluation and [facility] and was noted to be confused.  Neurological consultation obtained for concern for seizure versus status epilepticus as well as altered mental status. The boyfriend reports that she does not take her medications.  She has been prescribed Keppra and phenytoin in the past. She was noted to have mild elevation in her CK.  Mild leukocytosis. She has a sepsis work-up pending.  She is also getting imaging of the chest and abdomen as a part of a trauma work-up because she was found down. No preceding illnesses or sicknesses per the boyfriend.    ROS: Unable to reliably ascertain due to patient's mentation-according to boyfriend, no preceding illnesses or sicknesses but he could not give more details.  Past Medical History:  Diagnosis Date   Alcohol abuse    SAH (subarachnoid hemorrhage) (HCC)    Seizure disorder (HCC)    TBI (traumatic brain injury) (HCC)    History reviewed. No pertinent family history.   Social History:   reports that she has been smoking cigarettes. She has been smoking an average of .25 packs per day. She has never used smokeless tobacco. She reports current alcohol use of about 1.0 standard drink of alcohol per week. She reports that she does not use  drugs.  Medications  Current Facility-Administered Medications:    aztreonam (AZACTAM) 2 g in sodium chloride 0.9 % 100 mL IVPB, 2 g, Intravenous, Once, Bethann Berkshire, MD   fosPHENYtoin (CEREBYX) 816 mg PE in sodium chloride 0.9 % 50 mL IVPB, 20 mg PE/kg, Intravenous, Once, Bethann Berkshire, MD   haloperidol lactate (HALDOL) injection 2 mg, 2 mg, Intravenous, Once, Bethann Berkshire, MD   metroNIDAZOLE (FLAGYL) IVPB 500 mg, 500 mg, Intravenous, Once, Bethann Berkshire, MD, Last Rate: 100 mL/hr at 11/07/20 2257, 500 mg at 11/07/20 2257   vancomycin (VANCOCIN) IVPB 1000 mg/200 mL premix, 1,000 mg, Intravenous, Once, Bethann Berkshire, MD  Current Outpatient Medications:    acetaminophen (TYLENOL) 500 MG tablet, Take 500 mg by mouth every 6 (six) hours as needed (Headaches and muscle aches.)., Disp: , Rfl:    levETIRAcetam (KEPPRA) 750 MG tablet, Take 2 tablets (1,500 mg total) by mouth 2 (two) times daily. (Patient not taking: Reported on 03/28/2019), Disp: 60 tablet, Rfl: 0   Multiple Vitamin (MULTIVITAMIN WITH MINERALS) TABS tablet, Take 1 tablet by mouth daily. (Patient not taking: Reported on 03/28/2019), Disp: , Rfl:    phenytoin (DILANTIN) 125 MG/5ML suspension, Take 6 mLs (150 mg total) by mouth 3 (three) times daily. (Patient not taking: Reported on 09/06/2018), Disp: 237 mL, Rfl: 12   Exam: Current vital signs: BP (!) 137/117   Pulse 98   Temp (!) 100.5 F (38.1 C) (Rectal)   Resp (!) 23   Ht 5\' 2"  (1.575 m)   Wt 40.8 kg   SpO2  98%   BMI 16.46 kg/m  Vital signs in last 24 hours: Temp:  [100.5 F (38.1 C)] 100.5 F (38.1 C) (07/25 2145) Pulse Rate:  [97-108] 98 (07/25 2315) Resp:  [18-31] 23 (07/25 2315) BP: (126-173)/(100-117) 137/117 (07/25 2315) SpO2:  [97 %-100 %] 98 % (07/25 2315) Weight:  [40.8 kg] 40.8 kg (07/25 2235) General: Patient is awake alert in no distress HEENT: Normocephalic and atraumatic, she has a rash on her face and neck. CVS: Regular rhythm Respiratory:  Breathing well saturating normally on room air Abdomen nondistended nontender Extremities warm well perfused Neurological exam She is awake alert She tracks the examiner and tries to mumble but is incoherent speech. Does not follow any commands Cranial nerves: Pupils equal round react light, extraocular movements appear intact based on her tracking the examiner, blinks to threat from both sides, face appears symmetric. Motor examination: Does not follow commands to left arm but on passively raising her arms up she is able to keep all 4 extremities up for a little while before the come down onto the bed.  She is able to withdraw all fours with good strength to noxious stimulation. Sensation: As above Coordination cannot be tested due to her mentation  Labs I have reviewed labs in epic and the results pertinent to this consultation are:   CBC    Component Value Date/Time   WBC 10.9 (H) 11/07/2020 2220   RBC 3.63 (L) 11/07/2020 2220   HGB 13.2 11/07/2020 2220   HCT 38.4 11/07/2020 2220   PLT 185 11/07/2020 2220   MCV 105.8 (H) 11/07/2020 2220   MCH 36.4 (H) 11/07/2020 2220   MCHC 34.4 11/07/2020 2220   RDW 14.2 11/07/2020 2220   LYMPHSABS 0.5 (L) 11/07/2020 2220   MONOABS 0.7 11/07/2020 2220   EOSABS 0.0 11/07/2020 2220   BASOSABS 0.0 11/07/2020 2220    CMP     Component Value Date/Time   NA 137 11/07/2020 2220   K 3.2 (L) 11/07/2020 2220   CL 95 (L) 11/07/2020 2220   CO2 23 11/07/2020 2220   GLUCOSE 114 (H) 11/07/2020 2220   BUN 7 11/07/2020 2220   CREATININE 0.48 11/07/2020 2220   CALCIUM 8.9 11/07/2020 2220   PROT 7.9 11/07/2020 2220   ALBUMIN 4.1 11/07/2020 2220   AST 45 (H) 11/07/2020 2220   ALT 25 11/07/2020 2220   ALKPHOS 94 11/07/2020 2220   BILITOT 1.2 11/07/2020 2220   GFRNONAA >60 11/07/2020 2220   GFRAA >60 04/09/2019 0820    Lipid Panel     Component Value Date/Time   TRIG 158 (H) 03/02/2018 2119   Imaging I have reviewed the images  obtained:  CT-scan of the head-multiple areas of scalp swelling and calvarial thickening with no calvarial fracture.  No acute intracranial abnormality.  Focus of encephalomalacia in the right anterior frontal lobe/superior frontal gyrus compatible with site of remote hemorrhage.  Slightly age advanced parenchymal volume loss. CT C-spine with no acute cervical spine fracture or traumatic listhesis, evaluation limited by motion artifact.  Assessment: 39 year old with past history of TBI, seizures, noncompliance to medication, alcohol abuse, presenting after being found down this evening.  Had a seizure earlier this morning with presumable return to baseline.  Found by boyfriend this evening and appears to be altered since. In the past has had similar presentations with breakthrough seizures and status epilepticus that has resolved with medications as well as prolonged postictal state. I suspect she has now prolonged postictal state and clinically does  not appear to be seizing. She would benefit from definitely getting an EEG and may need a continuous EEG for which I recommend that she admitted to [facility].  Impression: History of seizure disorder with breakthrough seizure Status epilepticus-likely resolved Noncompliance to medication Alcohol abuse  Recommendations: -Loaded with Keppra 1 g IV x1.  Continue Keppra 1500 twice daily which is her home dose. -Check phenytoin level -Recommend fosphenytoin 20 mg/kg phenytoin equivalents x1 followed by phenytoin 100 mg 3 times daily. -Routine EEG in the morning-May need continuous EEG depending on the read but clinically does not appear to be seizing. -Seizure precautions -Admit to [facility]-plan discussed with Dr. Estell Harpin and Dr. Daun Peacock in the ER at [facility].  -- Milon Dikes, MD Neurologist [facility] Pager: [phone]

## 2020-11-07 NOTE — Progress Notes (Signed)
A consult was received from an ED physician for vancomycin and aztreonam per pharmacy dosing.  The patient's profile has been reviewed for ht/wt/allergies/indication/available labs.   A one time order has been placed for vancomycin 1gm and aztreonam 2gm   Further antibiotics/pharmacy consults should be ordered by admitting physician if indicated.                       Thank you, Arley Phenix RPh 11/07/2020, 10:57 PM

## 2020-11-07 NOTE — ED Triage Notes (Signed)
BIB EMS from home for witnessed seizure at 10:30am. Pt recovered appropriately after this seizure. Pt is noncompliant with seizure medications. Pt seized again tonight at unknown time and has been confused since

## 2020-11-07 NOTE — ED Notes (Addendum)
Contacted flow manager for hospitalist per MD request, flow manager will re-page Julian Reil MD

## 2020-11-08 ENCOUNTER — Encounter (HOSPITAL_COMMUNITY): Payer: Self-pay | Admitting: Internal Medicine

## 2020-11-08 DIAGNOSIS — R6510 Systemic inflammatory response syndrome (SIRS) of non-infectious origin without acute organ dysfunction: Secondary | ICD-10-CM | POA: Diagnosis present

## 2020-11-08 DIAGNOSIS — S069X3S Unspecified intracranial injury with loss of consciousness of 1 hour to 5 hours 59 minutes, sequela: Secondary | ICD-10-CM

## 2020-11-08 DIAGNOSIS — R569 Unspecified convulsions: Secondary | ICD-10-CM

## 2020-11-08 DIAGNOSIS — E8729 Other acidosis: Secondary | ICD-10-CM | POA: Diagnosis present

## 2020-11-08 DIAGNOSIS — R410 Disorientation, unspecified: Secondary | ICD-10-CM

## 2020-11-08 DIAGNOSIS — F1010 Alcohol abuse, uncomplicated: Secondary | ICD-10-CM

## 2020-11-08 DIAGNOSIS — E872 Acidosis: Secondary | ICD-10-CM

## 2020-11-08 HISTORY — DX: Systemic inflammatory response syndrome (sirs) of non-infectious origin without acute organ dysfunction: R65.10

## 2020-11-08 LAB — COMPREHENSIVE METABOLIC PANEL
ALT: 22 U/L (ref 0–44)
AST: 41 U/L (ref 15–41)
Albumin: 3.5 g/dL (ref 3.5–5.0)
Alkaline Phosphatase: 82 U/L (ref 38–126)
Anion gap: 11 (ref 5–15)
BUN: <5 mg/dL — ABNORMAL LOW (ref 6–20)
CO2: 22 mmol/L (ref 22–32)
Calcium: 7.4 mg/dL — ABNORMAL LOW (ref 8.9–10.3)
Chloride: 102 mmol/L (ref 98–111)
Creatinine, Ser: 0.34 mg/dL — ABNORMAL LOW (ref 0.44–1.00)
GFR, Estimated: >60 mL/min (ref >60)
Glucose, Bld: 115 mg/dL — ABNORMAL HIGH (ref 70–99)
Potassium: 2.7 mmol/L — CL (ref 3.5–5.1)
Sodium: 135 mmol/L (ref 135–145)
Total Bilirubin: 1.1 mg/dL (ref 0.3–1.2)
Total Protein: 6.8 g/dL (ref 6.5–8.1)

## 2020-11-08 LAB — HIV ANTIBODY (ROUTINE TESTING W REFLEX): HIV Screen 4th Generation wRfx: NONREACTIVE

## 2020-11-08 LAB — BLOOD CULTURE ID PANEL (REFLEXED) - BCID2

## 2020-11-08 LAB — CBC
HCT: 37 % (ref 36.0–46.0)
Hemoglobin: 12.4 g/dL (ref 12.0–15.0)
MCH: 36.3 pg — ABNORMAL HIGH (ref 26.0–34.0)
MCHC: 33.5 g/dL (ref 30.0–36.0)
MCV: 108.2 fL — ABNORMAL HIGH (ref 80.0–100.0)
Platelets: 156 10*3/uL (ref 150–400)
RBC: 3.42 MIL/uL — ABNORMAL LOW (ref 3.87–5.11)
RDW: 14.2 % (ref 11.5–15.5)
WBC: 12.4 10*3/uL — ABNORMAL HIGH (ref 4.0–10.5)
nRBC: 0 % (ref 0.0–0.2)

## 2020-11-08 LAB — POTASSIUM: Potassium: 5.2 mmol/L — ABNORMAL HIGH (ref 3.5–5.1)

## 2020-11-08 LAB — CORTISOL-AM, BLOOD: Cortisol - AM: 36.2 ug/dL — ABNORMAL HIGH (ref 6.7–22.6)

## 2020-11-08 LAB — PHENYTOIN LEVEL, TOTAL: Phenytoin Lvl: <2.5 ug/mL — ABNORMAL LOW (ref 10.0–20.0)

## 2020-11-08 LAB — MAGNESIUM: Magnesium: 1.6 mg/dL — ABNORMAL LOW (ref 1.7–2.4)

## 2020-11-08 LAB — VITAMIN B12: Vitamin B-12: 320 pg/mL (ref 180–914)

## 2020-11-08 LAB — BETA-HYDROXYBUTYRIC ACID: Beta-Hydroxybutyric Acid: 2.07 mmol/L — ABNORMAL HIGH (ref 0.05–0.27)

## 2020-11-08 LAB — PHOSPHORUS
Phosphorus: 2.4 mg/dL — ABNORMAL LOW (ref 2.5–4.6)
Phosphorus: 1.3 mg/dL — ABNORMAL LOW (ref 2.5–4.6)

## 2020-11-08 LAB — PROCALCITONIN: Procalcitonin: <0.1 ng/mL

## 2020-11-08 MED ORDER — VANCOMYCIN HCL 750 MG/150ML IV SOLN: 750.0000 mg | INTRAVENOUS | Status: DC

## 2020-11-08 MED ORDER — POTASSIUM PHOSPHATES 15 MMOLE/5ML IV SOLN
30.0000 mmol | Freq: Once | INTRAVENOUS | Status: AC
  Administered 2020-11-08: 30 mmol via INTRAVENOUS
  Filled 2020-11-08: qty 10

## 2020-11-08 MED ORDER — DIPHENHYDRAMINE HCL 50 MG/ML IJ SOLN
25.0000 mg | Freq: Once | INTRAMUSCULAR | Status: AC
  Administered 2020-11-08: 25 mg via INTRAVENOUS
  Filled 2020-11-08: qty 1

## 2020-11-08 MED ORDER — ONDANSETRON HCL 4 MG/2ML IJ SOLN: 4.0000 mg | Freq: Four times a day (QID) | INTRAMUSCULAR | Status: DC | PRN

## 2020-11-08 MED ORDER — SODIUM CHLORIDE 0.9 % IV SOLN
2.0000 g | Freq: Two times a day (BID) | INTRAVENOUS | Status: DC
  Administered 2020-11-08 – 2020-11-10 (×6): 2 g via INTRAVENOUS
  Filled 2020-11-08 (×2): qty 20
  Filled 2020-11-08: qty 2
  Filled 2020-11-08 (×3): qty 20
  Filled 2020-11-08: qty 2
  Filled 2020-11-08: qty 20

## 2020-11-08 MED ORDER — SODIUM CHLORIDE 0.45 % IV SOLN: INTRAVENOUS | Status: DC

## 2020-11-08 MED ORDER — K PHOS MONO-SOD PHOS DI & MONO 155-852-130 MG PO TABS
250.0000 mg | ORAL_TABLET | Freq: Once | ORAL | Status: DC
  Filled 2020-11-08: qty 1

## 2020-11-08 MED ORDER — MAGNESIUM SULFATE 2 GM/50ML IV SOLN
2.0000 g | Freq: Once | INTRAVENOUS | Status: AC
  Administered 2020-11-08: 2 g via INTRAVENOUS
  Filled 2020-11-08: qty 50

## 2020-11-08 MED ORDER — THIAMINE HCL 100 MG/ML IJ SOLN
500.0000 mg | Freq: Once | INTRAVENOUS | Status: AC
  Administered 2020-11-08: 500 mg via INTRAVENOUS
  Filled 2020-11-08: qty 5

## 2020-11-08 MED ORDER — HALOPERIDOL LACTATE 5 MG/ML IJ SOLN
2.0000 mg | Freq: Four times a day (QID) | INTRAMUSCULAR | Status: DC | PRN
  Administered 2020-11-10: 2 mg via INTRAVENOUS
  Filled 2020-11-08: qty 1

## 2020-11-08 MED ORDER — VANCOMYCIN HCL 500 MG/100ML IV SOLN
500.0000 mg | Freq: Two times a day (BID) | INTRAVENOUS | Status: DC
  Administered 2020-11-09 – 2020-11-10 (×3): 500 mg via INTRAVENOUS
  Filled 2020-11-08 (×5): qty 100

## 2020-11-08 MED ORDER — DIPHENHYDRAMINE HCL 50 MG/ML IJ SOLN
INTRAMUSCULAR | Status: AC
  Filled 2020-11-08: qty 1

## 2020-11-08 MED ORDER — POTASSIUM CHLORIDE IN NACL 20-0.45 MEQ/L-% IV SOLN
INTRAVENOUS | Status: DC
  Filled 2020-11-08: qty 1000

## 2020-11-08 MED ORDER — METRONIDAZOLE 500 MG/100ML IV SOLN: 500.0000 mg | Freq: Three times a day (TID) | INTRAVENOUS | Status: DC

## 2020-11-08 MED ORDER — FAMOTIDINE IN NACL 20-0.9 MG/50ML-% IV SOLN
20.0000 mg | Freq: Once | INTRAVENOUS | Status: AC
  Administered 2020-11-08: 20 mg via INTRAVENOUS
  Filled 2020-11-08: qty 50

## 2020-11-08 MED ORDER — ONDANSETRON HCL 4 MG PO TABS: 4.0000 mg | ORAL_TABLET | Freq: Four times a day (QID) | ORAL | Status: DC | PRN

## 2020-11-08 MED ORDER — POTASSIUM CHLORIDE 10 MEQ/100ML IV SOLN
INTRAVENOUS | Status: AC
  Filled 2020-11-08: qty 100

## 2020-11-08 MED ORDER — DIPHENHYDRAMINE HCL 50 MG/ML IJ SOLN
25.0000 mg | Freq: Once | INTRAMUSCULAR | Status: AC
  Administered 2020-11-08: 25 mg via INTRAVENOUS

## 2020-11-08 MED ORDER — ACETAMINOPHEN 325 MG PO TABS: 650.0000 mg | ORAL_TABLET | Freq: Four times a day (QID) | ORAL | Status: DC | PRN

## 2020-11-08 MED ORDER — ENOXAPARIN SODIUM 30 MG/0.3ML IJ SOSY
30.0000 mg | PREFILLED_SYRINGE | INTRAMUSCULAR | Status: DC
  Administered 2020-11-08 – 2020-11-11 (×4): 30 mg via SUBCUTANEOUS
  Filled 2020-11-08 (×4): qty 0.3

## 2020-11-08 MED ORDER — PHENYTOIN SODIUM 50 MG/ML IJ SOLN
100.0000 mg | Freq: Three times a day (TID) | INTRAMUSCULAR | Status: DC
  Administered 2020-11-08 – 2020-11-10 (×6): 100 mg via INTRAVENOUS
  Filled 2020-11-08 (×6): qty 2

## 2020-11-08 MED ORDER — LEVETIRACETAM IN NACL 1500 MG/100ML IV SOLN
1500.0000 mg | Freq: Two times a day (BID) | INTRAVENOUS | Status: DC
  Administered 2020-11-08 – 2020-11-10 (×5): 1500 mg via INTRAVENOUS
  Filled 2020-11-08 (×7): qty 100

## 2020-11-08 MED ORDER — ACETAMINOPHEN 650 MG RE SUPP: 650.0000 mg | Freq: Four times a day (QID) | RECTAL | Status: DC | PRN

## 2020-11-08 MED ORDER — POTASSIUM CHLORIDE 10 MEQ/100ML IV SOLN
10.0000 meq | INTRAVENOUS | Status: AC
  Administered 2020-11-08 (×4): 10 meq via INTRAVENOUS
  Filled 2020-11-08 (×4): qty 100

## 2020-11-08 NOTE — Progress Notes (Signed)
Subjective: Gradually becoming more responsive but still not talking as of 9 AM today   Objective: Current vital signs: BP (!) 123/103   Pulse 99   Temp 98.7 F (37.1 C) (Rectal)   Resp (!) 26   Ht  (1.575 m)   Wt 40.8 kg   SpO2 100%   BMI 16.46 kg/m  Vital signs in last 24 hours: Temp:  [98.4 F (36.9 C)-100.5 F (38.1 C)] 98.7 F (37.1 C) (07/26 0245) Pulse Rate:  [95-131] 99 (07/26 1700) Resp:  [14-31] 26 (07/26 1700) BP: (107-173)/(88-117) 123/103 (07/26 1700) SpO2:  [95 %-100 %] 100 % (07/26 1700) Weight:  [40.8 kg] 40.8 kg (07/25 2235)  Intake/Output from previous day: 07/25 0701 - 07/26 0700 In: 2740 [IV Piggyback:2740] Out: -  Intake/Output this shift: Total I/O In: 2366.6 [I.V.:1422.8; IV Piggyback:943.8] Out: -  Nutritional status:  Diet Order             Diet NPO time specified  Diet effective now                  HEENT: Maculopapular rash to face bilaterally (chronic per patient's companion).  Lungs: Respirations unlabored Ext: No edema. Some bruising noted to legs.   Neurologic Exam: Ment: Drowsy. Opens eyes to noxious stimulation and will make eye contact and track briefly. Does not follow commands. Nonverbal.  CN: PERRL. Gazes to left and right. No nystagmus. Face symmetric.  Motor: Will move BUE and BLE equally to noxious stimuli Sensory: Reacts to noxious stimuli x 4 Reflexes: 3+ bilateral brachioradialis, 2+ bilateral patellae. Toes downgoing.  Cerebellar: Unable to assess Gait: Unable to assess  Lab Results: Results for orders placed or performed during the hospital encounter of 11/07/20 (from the past 48 hour(s))  Resp Panel by RT-PCR (Flu A&B, Covid) Nasopharyngeal Swab     Status: None   Collection Time: 11/07/20  9:42 PM   Specimen: Nasopharyngeal Swab; Nasopharyngeal(NP) swabs in vial transport medium  Result Value Ref Range   SARS Coronavirus 2 by RT PCR NEGATIVE NEGATIVE    Comment: (NOTE) SARS-CoV-2 target nucleic  acids are NOT DETECTED.  The SARS-CoV-2 RNA is generally detectable in upper respiratory specimens during the acute phase of infection. The lowest concentration of SARS-CoV-2 viral copies this assay can detect is 138 copies/mL. A negative result does not preclude SARS-Cov-2 infection and should not be used as the sole basis for treatment or other patient management decisions. A negative result may occur with  improper specimen collection/handling, submission of specimen other than nasopharyngeal swab, presence of viral mutation(s) within the areas targeted by this assay, and inadequate number of viral copies(<138 copies/mL). A negative result must be combined with clinical observations, patient history, and epidemiological information. The expected result is Negative.  Fact Sheet for Patients:  BloggerCourse.com  Fact Sheet for Healthcare Providers:  SeriousBroker.it  This test is no t yet approved or cleared by the [place] FDA and  has been authorized for detection and/or diagnosis of SARS-CoV-2 by FDA under an Emergency Use Authorization (EUA). This EUA will remain  in effect (meaning this test can be used) for the duration of the COVID-19 declaration under Section 564(b)(1) of the Act, 21 U.S.C.section 360bbb-3(b)(1), unless the authorization is terminated  or revoked sooner.       Influenza A by PCR NEGATIVE NEGATIVE   Influenza B by PCR NEGATIVE NEGATIVE    Comment: (NOTE) The Xpert Xpress SARS-CoV-2/FLU/RSV plus assay is intended as an aid in  the diagnosis of influenza from Nasopharyngeal swab specimens and should not be used as a sole basis for treatment. Nasal washings and aspirates are unacceptable for Xpert Xpress SARS-CoV-2/FLU/RSV testing.  Fact Sheet for Patients: BloggerCourse.com  Fact Sheet for Healthcare Providers: SeriousBroker.it  This test is not  yet approved or cleared by the [place] FDA and has been authorized for detection and/or diagnosis of SARS-CoV-2 by FDA under an Emergency Use Authorization (EUA). This EUA will remain in effect (meaning this test can be used) for the duration of the COVID-19 declaration under Section 564(b)(1) of the Act, 21 U.S.C. section 360bbb-3(b)(1), unless the authorization is terminated or revoked.  Performed at [facility], [address], [place], [place] [address]   Lactic acid, plasma     Status: None   Collection Time: 11/07/20  9:42 PM  Result Value Ref Range   Lactic Acid, Venous 1.8 0.5 - 1.9 mmol/L    Comment: Performed at [facility], [address], [place], [place] [address]  CBG monitoring, ED     Status: Abnormal   Collection Time: 11/07/20  9:56 PM  Result Value Ref Range   Glucose-Capillary 122 (H) 70 - 99 mg/dL    Comment: Glucose reference range applies only to samples taken after fasting for at least 8 hours.  I-Stat beta hCG blood, ED     Status: None   Collection Time: 11/07/20 10:11 PM  Result Value Ref Range   I-stat hCG, quantitative <5.0 <5 mIU/mL   Comment 3            Comment:   GEST. AGE      CONC.  (mIU/mL)   <=1 WEEK        5 - 50     2 WEEKS       50 - 500     3 WEEKS       100 - 10,000     4 WEEKS     1,000 - 30,000        FEMALE AND NON-PREGNANT FEMALE:     LESS THAN 5 mIU/mL   CBC with Differential/Platelet     Status: Abnormal   Collection Time: 11/07/20 10:20 PM  Result Value Ref Range   WBC 10.9 (H) 4.0 - 10.5 K/uL   RBC 3.63 (L) 3.87 - 5.11 MIL/uL   Hemoglobin 13.2 12.0 - 15.0 g/dL   HCT 32.4 40.1 - 02.7 %   MCV 105.8 (H) 80.0 - 100.0 fL   MCH 36.4 (H) 26.0 - 34.0 pg   MCHC 34.4 30.0 - 36.0 g/dL   RDW 25.3 66.4 - 40.3 %   Platelets 185 150 - 400 K/uL   nRBC 0.0 0.0 - 0.2 %   Neutrophils Relative % 88 %   Neutro Abs 9.6 (H) 1.7 - 7.7 K/uL   Lymphocytes Relative 5 %   Lymphs Abs 0.5 (L) 0.7 - 4.0  K/uL   Monocytes Relative 7 %   Monocytes Absolute 0.7 0.1 - 1.0 K/uL   Eosinophils Relative 0 %   Eosinophils Absolute 0.0 0.0 - 0.5 K/uL   Basophils Relative 0 %   Basophils Absolute 0.0 0.0 - 0.1 K/uL   Immature Granulocytes 0 %   Abs Immature Granulocytes 0.04 0.00 - 0.07 K/uL    Comment: Performed at [facility], [address], [place], [place] [address]  Comprehensive metabolic panel     Status: Abnormal   Collection Time: 11/07/20 10:20 PM  Result Value Ref Range   Sodium 137 135 - 145 mmol/L   Potassium 3.2 (L) 3.5 - 5.1 mmol/L   Chloride 95 (L) 98 - 111 mmol/L   CO2 23 22 - 32 mmol/L   Glucose, Bld 114 (H) 70 - 99 mg/dL    Comment: Glucose reference range applies only to samples taken after fasting for at least 8 hours.   BUN 7 6 - 20 mg/dL   Creatinine, Ser 1.61 0.44 - 1.00 mg/dL   Calcium 8.9 8.9 - 09.6 mg/dL   Total Protein 7.9 6.5 - 8.1 g/dL   Albumin 4.1 3.5 - 5.0 g/dL   AST 45 (H) 15 - 41 U/L   ALT 25 0 - 44 U/L   Alkaline Phosphatase 94 38 - 126 U/L   Total Bilirubin 1.2 0.3 - 1.2 mg/dL   GFR, Estimated >04 >54 mL/min    Comment: (NOTE) Calculated using the CKD-EPI Creatinine Equation (2021)    Anion gap 19 (H) 5 - 15    Comment: Performed at [facility], [address], [place], [place] [address]  CK     Status: Abnormal   Collection Time: 11/07/20 10:20 PM  Result Value Ref Range   Total CK 550 (H) 38 - 234 U/L    Comment: Performed at [facility], [address], [place], [place] [address]  Ethanol     Status: None   Collection Time: 11/07/20 10:20 PM  Result Value Ref Range   Alcohol, Ethyl (B) <10 <10 mg/dL    Comment: (NOTE) Lowest detectable limit for serum alcohol is 10 mg/dL.  For medical purposes only. Performed at [facility], [address], [place], [place] [address]   Urinalysis, Routine w reflex microscopic Urine, Catheterized     Status: Abnormal    Collection Time: 11/07/20 10:20 PM  Result Value Ref Range   Color, Urine YELLOW YELLOW   APPearance CLEAR CLEAR   Specific Gravity, Urine 1.018 1.005 - 1.030   pH 6.0 5.0 - 8.0   Glucose, UA NEGATIVE NEGATIVE mg/dL   Hgb urine dipstick MODERATE (A) NEGATIVE   Bilirubin Urine NEGATIVE NEGATIVE   Ketones, ur 80 (A) NEGATIVE mg/dL   Protein, ur 130 (A) NEGATIVE mg/dL   Nitrite NEGATIVE NEGATIVE   Leukocytes,Ua NEGATIVE NEGATIVE   RBC / HPF 0-5 0 - 5 RBC/hpf   WBC, UA 0-5 0 - 5 WBC/hpf   Bacteria, UA NONE SEEN NONE SEEN   Squamous Epithelial / LPF 0-5 0 - 5   Mucus PRESENT    Hyaline Casts, UA PRESENT     Comment: Performed at [facility], [address], [place], [place] [address]  Rapid urine drug screen (hospital performed)     Status: None   Collection Time: 11/07/20 10:20 PM  Result Value Ref Range   Opiates NONE DETECTED NONE DETECTED   Cocaine NONE DETECTED NONE DETECTED   Benzodiazepines NONE DETECTED NONE DETECTED   Amphetamines NONE DETECTED NONE DETECTED   Tetrahydrocannabinol NONE DETECTED NONE DETECTED   Barbiturates NONE DETECTED NONE DETECTED    Comment: (NOTE) DRUG SCREEN FOR MEDICAL PURPOSES ONLY.  IF CONFIRMATION IS NEEDED FOR ANY PURPOSE, NOTIFY LAB WITHIN 5 DAYS.  LOWEST DETECTABLE LIMITS FOR URINE DRUG SCREEN Drug Class                     Cutoff (ng/mL) Amphetamine and metabolites    1000 Barbiturate and metabolites  200 Benzodiazepine                 200 Tricyclics and metabolites     300 Opiates and metabolites        300 Cocaine and metabolites        300 THC                            50 Performed at [facility], [address], [place], [place] [address]   Protime-INR     Status: None   Collection Time: 11/07/20 10:20 PM  Result Value Ref Range   Prothrombin Time 12.2 11.4 - 15.2 seconds   INR 0.9 0.8 - 1.2    Comment: (NOTE) INR goal varies based on device and disease states. Performed at [facility], [address], [place], [place] [address]   APTT     Status: None   Collection Time: 11/07/20 10:20 PM  Result Value Ref Range   aPTT 31 24 - 36 seconds    Comment: Performed at [facility], [address] [place], [place] [address]  Blood Culture (routine x 2)     Status: None (Preliminary result)   Collection Time: 11/07/20 10:20 PM   Specimen: BLOOD LEFT FOREARM  Result Value Ref Range   Specimen Description      BLOOD LEFT FOREARM Performed at [facility], [address], [place], [place] [address]    Special Requests      BOTTLES DRAWN AEROBIC AND ANAEROBIC Blood Culture adequate volume Performed at [facility], [address] [place], [place] [address]    Culture      NO GROWTH < 12 HOURS Performed at [facility], [address] [place], [place] [address]    Report Status PENDING   Blood Culture (routine x 2)     Status: Abnormal (Preliminary result)   Collection Time: 11/07/20 10:20 PM   Specimen: BLOOD LEFT FOREARM  Result Value Ref Range   Specimen Description      BLOOD LEFT FOREARM Performed at [facility], [address], [place], [place] [address]    Special Requests      BOTTLES DRAWN AEROBIC AND ANAEROBIC Blood Culture adequate volume Performed at [facility], [address], [place], [place] [address]    Culture  Setup Time      ANAEROBIC BOTTLE ONLY GRAM VARIABLE ROD Organism ID to follow CRITICAL RESULT CALLED TO, READ BACK BY AND VERIFIED WITHAzzie Glatter PHARMD [id number] 11/08/20 A BROWNING Performed at [facility], [address] [place], [place] [address]    Culture GRAM VARIABLE ROD (A)    Report Status PENDING   Magnesium     Status: Abnormal   Collection Time: 11/07/20 10:20 PM  Result Value Ref Range   Magnesium 1.6 (L) 1.7 - 2.4 mg/dL    Comment: Performed at [facility], [address], [place], [place]  [address]  Beta-hydroxybutyric acid     Status: Abnormal   Collection Time: 11/07/20 10:20 PM  Result Value Ref Range   Beta-Hydroxybutyric Acid 2.07 (H) 0.05 - 0.27 mmol/L    Comment: Performed at [facility], [address], [place], [place] [address]  Phosphorus     Status: Abnormal   Collection Time: 11/07/20 10:20 PM  Result Value Ref Range   Phosphorus 2.4 (  L) 2.5 - 4.6 mg/dL    Comment: Performed at [facility], [address] [place], [place] [address]  Phenytoin level, total     Status: Abnormal   Collection Time: 11/07/20 10:20 PM  Result Value Ref Range   Phenytoin Lvl <2.5 (L) 10.0 - 20.0 ug/mL    Comment: Performed at [facility], [address], [place], [place] [address]  Blood Culture ID Panel (Reflexed)     Status: Abnormal   Collection Time: 11/07/20 10:20 PM  Result Value Ref Range   Enterococcus faecalis NOT DETECTED NOT DETECTED   Enterococcus Faecium NOT DETECTED NOT DETECTED   Listeria monocytogenes NOT DETECTED NOT DETECTED   Staphylococcus species DETECTED (A) NOT DETECTED    Comment: CRITICAL RESULT CALLED TO, READ BACK BY AND VERIFIED WITH: J LEGGE PHARMD 1708 11/08/20 A BROWNING    Staphylococcus aureus (BCID) NOT DETECTED NOT DETECTED   Staphylococcus epidermidis NOT DETECTED NOT DETECTED   Staphylococcus lugdunensis NOT DETECTED NOT DETECTED   Streptococcus species NOT DETECTED NOT DETECTED   Streptococcus agalactiae NOT DETECTED NOT DETECTED   Streptococcus pneumoniae NOT DETECTED NOT DETECTED   Streptococcus pyogenes NOT DETECTED NOT DETECTED   A.calcoaceticus-baumannii NOT DETECTED NOT DETECTED   Bacteroides fragilis NOT DETECTED NOT DETECTED   Enterobacterales NOT DETECTED NOT DETECTED   Enterobacter cloacae complex NOT DETECTED NOT DETECTED   Escherichia coli NOT DETECTED NOT DETECTED   Klebsiella aerogenes NOT DETECTED NOT DETECTED   Klebsiella oxytoca NOT DETECTED NOT DETECTED    Klebsiella pneumoniae NOT DETECTED NOT DETECTED   Proteus species NOT DETECTED NOT DETECTED   Salmonella species NOT DETECTED NOT DETECTED   Serratia marcescens NOT DETECTED NOT DETECTED   Haemophilus influenzae NOT DETECTED NOT DETECTED   Neisseria meningitidis NOT DETECTED NOT DETECTED   Pseudomonas aeruginosa NOT DETECTED NOT DETECTED   Stenotrophomonas maltophilia NOT DETECTED NOT DETECTED   Candida albicans NOT DETECTED NOT DETECTED   Candida auris NOT DETECTED NOT DETECTED   Candida glabrata NOT DETECTED NOT DETECTED   Candida krusei NOT DETECTED NOT DETECTED   Candida parapsilosis NOT DETECTED NOT DETECTED   Candida tropicalis NOT DETECTED NOT DETECTED   Cryptococcus neoformans/gattii NOT DETECTED NOT DETECTED    Comment: Performed at [facility], [address] [place], [place] [address]  CBC     Status: Abnormal   Collection Time: 11/08/20  6:13 AM  Result Value Ref Range   WBC 12.4 (H) 4.0 - 10.5 K/uL   RBC 3.42 (L) 3.87 - 5.11 MIL/uL   Hemoglobin 12.4 12.0 - 15.0 g/dL   HCT 21.3 08.6 - 57.8 %   MCV 108.2 (H) 80.0 - 100.0 fL   MCH 36.3 (H) 26.0 - 34.0 pg   MCHC 33.5 30.0 - 36.0 g/dL   RDW 46.9 62.9 - 52.8 %   Platelets 156 150 - 400 K/uL   nRBC 0.0 0.0 - 0.2 %    Comment: Performed at [facility], [address] [place], [place] [address]  Comprehensive metabolic panel     Status: Abnormal   Collection Time: 11/08/20  6:13 AM  Result Value Ref Range   Sodium 135 135 - 145 mmol/L   Potassium 2.7 (LL) 3.5 - 5.1 mmol/L    Comment: CRITICAL RESULT CALLED TO, READ BACK BY AND VERIFIED WITH: BLAIR I. ON 11/08/2020 @ 0710 BY MECIAL J.    Chloride 102 98 - 111 mmol/L   CO2 22 22 - 32  mmol/L   Glucose, Bld 115 (H) 70 - 99 mg/dL    Comment: Glucose reference range applies only to samples taken after fasting for at least 8 hours.   BUN <5 (L) 6 - 20 mg/dL   Creatinine, Ser 8.11 (L) 0.44 - 1.00 mg/dL   Calcium 7.4 (L) 8.9 - 10.3 mg/dL   Total  Protein 6.8 6.5 - 8.1 g/dL   Albumin 3.5 3.5 - 5.0 g/dL   AST 41 15 - 41 U/L   ALT 22 0 - 44 U/L   Alkaline Phosphatase 82 38 - 126 U/L   Total Bilirubin 1.1 0.3 - 1.2 mg/dL   GFR, Estimated >91 >47 mL/min    Comment: (NOTE) Calculated using the CKD-EPI Creatinine Equation (2021)    Anion gap 11 5 - 15    Comment: Performed at [facility], [address], [place], [place] [address]  Cortisol-am, blood     Status: Abnormal   Collection Time: 11/08/20  6:13 AM  Result Value Ref Range   Cortisol - AM 36.2 (H) 6.7 - 22.6 ug/dL    Comment: Performed at [facility], [address], [place], [place] [address]  Procalcitonin     Status: None   Collection Time: 11/08/20  6:13 AM  Result Value Ref Range   Procalcitonin <0.10 ng/mL    Comment:        Interpretation: PCT (Procalcitonin) <= 0.5 ng/mL: Systemic infection (sepsis) is not likely. Local bacterial infection is possible. (NOTE)       Sepsis PCT Algorithm           Lower Respiratory Tract                                      Infection PCT Algorithm    ----------------------------     ----------------------------         PCT < 0.25 ng/mL                PCT < 0.10 ng/mL          Strongly encourage             Strongly discourage   discontinuation of antibiotics    initiation of antibiotics    ----------------------------     -----------------------------       PCT 0.25 - 0.50 ng/mL            PCT 0.10 - 0.25 ng/mL               OR       >80% decrease in PCT            Discourage initiation of                                            antibiotics      Encourage discontinuation           of antibiotics    ----------------------------     -----------------------------         PCT >= 0.50 ng/mL              PCT 0.26 - 0.50 ng/mL               AND        <80% decrease in  PCT             Encourage initiation of                                             antibiotics       Encourage continuation            of antibiotics    ----------------------------     -----------------------------        PCT >= 0.50 ng/mL                  PCT > 0.50 ng/mL               AND         increase in PCT                  Strongly encourage                                      initiation of antibiotics    Strongly encourage escalation           of antibiotics                                     -----------------------------                                           PCT <= 0.25 ng/mL                                                 OR                                        > 80% decrease in PCT                                      Discontinue / Do not initiate                                             antibiotics  Performed at [facility], [address] [place], [place] [address]   HIV Antibody (routine testing w rflx)     Status: None   Collection Time: 11/08/20  6:13 AM  Result Value Ref Range   HIV Screen 4th Generation wRfx Non Reactive Non Reactive    Comment: Performed at [facility], [address], [place], [place] [address]  Phosphorus     Status: Abnormal   Collection Time: 11/08/20  6:13 AM  Result Value Ref Range   Phosphorus 1.3 (L) 2.5 - 4.6 mg/dL    Comment: Performed at [facility], [address], [place], [place] [address]  Potassium     Status: Abnormal   Collection Time: 11/08/20  3:06 PM  Result Value Ref Range   Potassium 5.2 (H) 3.5 - 5.1 mmol/L    Comment: DELTA CHECK NOTED Performed at [facility], [address], [place], [place] [address]     Recent Results (from the past 240 hour(s))  Resp Panel by RT-PCR (Flu A&B, Covid) Nasopharyngeal Swab     Status: None   Collection Time: 11/07/20  9:42 PM   Specimen: Nasopharyngeal Swab; Nasopharyngeal(NP) swabs in vial transport medium  Result Value Ref Range Status   SARS Coronavirus 2 by RT PCR NEGATIVE NEGATIVE Final    Comment: (NOTE) SARS-CoV-2 target  nucleic acids are NOT DETECTED.  The SARS-CoV-2 RNA is generally detectable in upper respiratory specimens during the acute phase of infection. The lowest concentration of SARS-CoV-2 viral copies this assay can detect is 138 copies/mL. A negative result does not preclude SARS-Cov-2 infection and should not be used as the sole basis for treatment or other patient management decisions. A negative result may occur with  improper specimen collection/handling, submission of specimen other than nasopharyngeal swab, presence of viral mutation(s) within the areas targeted by this assay, and inadequate number of viral copies(<138 copies/mL). A negative result must be combined with clinical observations, patient history, and epidemiological information. The expected result is Negative.  Fact Sheet for Patients:  BloggerCourse.com  Fact Sheet for Healthcare Providers:  SeriousBroker.it  This test is no t yet approved or cleared by the [place] FDA and  has been authorized for detection and/or diagnosis of SARS-CoV-2 by FDA under an Emergency Use Authorization (EUA). This EUA will remain  in effect (meaning this test can be used) for the duration of the COVID-19 declaration under Section 564(b)(1) of the Act, 21 U.S.C.section 360bbb-3(b)(1), unless the authorization is terminated  or revoked sooner.       Influenza A by PCR NEGATIVE NEGATIVE Final   Influenza B by PCR NEGATIVE NEGATIVE Final    Comment: (NOTE) The Xpert Xpress SARS-CoV-2/FLU/RSV plus assay is intended as an aid in the diagnosis of influenza from Nasopharyngeal swab specimens and should not be used as a sole basis for treatment. Nasal washings and aspirates are unacceptable for Xpert Xpress SARS-CoV-2/FLU/RSV testing.  Fact Sheet for Patients: BloggerCourse.com  Fact Sheet for Healthcare  Providers: SeriousBroker.it  This test is not yet approved or cleared by the [place] FDA and has been authorized for detection and/or diagnosis of SARS-CoV-2 by FDA under an Emergency Use Authorization (EUA). This EUA will remain in effect (meaning this test can be used) for the duration of the COVID-19 declaration under Section 564(b)(1) of the Act, 21 U.S.C. section 360bbb-3(b)(1), unless the authorization is terminated or revoked.  Performed at [facility], [address], [place], [place] [address]   Blood Culture (routine x 2)     Status: None (Preliminary result)   Collection Time: 11/07/20 10:20 PM   Specimen: BLOOD LEFT FOREARM  Result Value Ref Range Status   Specimen Description   Final    BLOOD LEFT FOREARM Performed at [facility], [address] [place], [place] [address]    Special Requests   Final    BOTTLES DRAWN AEROBIC AND ANAEROBIC Blood Culture adequate volume Performed at [facility], [address], [place], [place] [address]    Culture   Final    NO GROWTH < 12 HOURS  Performed at [facility], [address] [place], [place] [address]    Report Status PENDING  Incomplete  Blood Culture (routine x 2)     Status: Abnormal (Preliminary result)   Collection Time: 11/07/20 10:20 PM   Specimen: BLOOD LEFT FOREARM  Result Value Ref Range Status   Specimen Description   Final    BLOOD LEFT FOREARM Performed at [facility], [address], [place], [place] [address]    Special Requests   Final    BOTTLES DRAWN AEROBIC AND ANAEROBIC Blood Culture adequate volume Performed at [facility], [address], [place], [place] [address]    Culture  Setup Time   Final    ANAEROBIC BOTTLE ONLY GRAM VARIABLE ROD Organism ID to follow CRITICAL RESULT CALLED TO, READ BACK BY AND VERIFIED WITHAzzie Glatter PHARMD 1708 11/08/20 A BROWNING Performed at [facility], [address] [place], [place] [address]    Culture GRAM VARIABLE ROD (A)  Final   Report Status PENDING  Incomplete  Blood Culture ID Panel (Reflexed)     Status: Abnormal   Collection Time: 11/07/20 10:20 PM  Result Value Ref Range Status   Enterococcus faecalis NOT DETECTED NOT DETECTED Final   Enterococcus Faecium NOT DETECTED NOT DETECTED Final   Listeria monocytogenes NOT DETECTED NOT DETECTED Final   Staphylococcus species DETECTED (A) NOT DETECTED Final    Comment: CRITICAL RESULT CALLED TO, READ BACK BY AND VERIFIED WITH: J LEGGE PHARMD 1708 11/08/20 A BROWNING    Staphylococcus aureus (BCID) NOT DETECTED NOT DETECTED Final   Staphylococcus epidermidis NOT DETECTED NOT DETECTED Final   Staphylococcus lugdunensis NOT DETECTED NOT DETECTED Final   Streptococcus species NOT DETECTED NOT DETECTED Final   Streptococcus agalactiae NOT DETECTED NOT DETECTED Final   Streptococcus pneumoniae NOT DETECTED NOT DETECTED Final   Streptococcus pyogenes NOT DETECTED NOT DETECTED Final   A.calcoaceticus-baumannii NOT DETECTED NOT DETECTED Final   Bacteroides fragilis NOT DETECTED NOT DETECTED Final   Enterobacterales NOT DETECTED NOT DETECTED Final   Enterobacter cloacae complex NOT DETECTED NOT DETECTED Final   Escherichia coli NOT DETECTED NOT DETECTED Final   Klebsiella aerogenes NOT DETECTED NOT DETECTED Final   Klebsiella oxytoca NOT DETECTED NOT DETECTED Final   Klebsiella pneumoniae NOT DETECTED NOT DETECTED Final   Proteus species NOT DETECTED NOT DETECTED Final   Salmonella species NOT DETECTED NOT DETECTED Final   Serratia marcescens NOT DETECTED NOT DETECTED Final   Haemophilus influenzae NOT DETECTED NOT DETECTED Final   Neisseria meningitidis NOT DETECTED NOT DETECTED Final   Pseudomonas aeruginosa NOT DETECTED NOT DETECTED Final   Stenotrophomonas maltophilia NOT DETECTED NOT DETECTED Final   Candida albicans NOT DETECTED NOT DETECTED Final   Candida auris NOT  DETECTED NOT DETECTED Final   Candida glabrata NOT DETECTED NOT DETECTED Final   Candida krusei NOT DETECTED NOT DETECTED Final   Candida parapsilosis NOT DETECTED NOT DETECTED Final   Candida tropicalis NOT DETECTED NOT DETECTED Final   Cryptococcus neoformans/gattii NOT DETECTED NOT DETECTED Final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

## 2020-11-08 NOTE — Progress Notes (Signed)
Pharmacy Antibiotic Note  Alyssa Little is a 40 y.o. female with hx  TBI, seizure disorder, SAH, alcohol abuse, and noncompliance to medication, presented to the ED on 11/07/2020 with seizure activity.  She's currently on vancomycin and ceftriaxone for empiric coverage for suspected CNS infection.  Not able to perform LP at this time d/t agitation.  Today, 11/08/2020: - day #1 abx - Tmax 100.5 - scr 0.34 (crcl~60)  Plan: - adjust vancomycin to 500 mg IV q12h for goal trough 15-20 - continue ceftriaxone 2gm IV q12h  ____________________________________________  Height: 5\' 2"  (157.5 cm) Weight: 40.8 kg (90 lb) IBW/kg (Calculated) : 50.1  Temp (24hrs), Avg:99.2 F (37.3 C), Min:98.4 F (36.9 C), Max:100.5 F (38.1 C)  Recent Labs  Lab 11/07/20 2142 11/07/20 2220 11/08/20 0613  WBC  --  10.9* 12.4*  CREATININE  --  0.48 0.34*  LATICACIDVEN 1.8  --   --     Estimated Creatinine Clearance: 60.2 mL/min (A) (by C-G formula based on SCr of 0.34 mg/dL (L)).    Allergies  Allergen Reactions   Shellfish Allergy Anaphylaxis   Flagyl [Metronidazole] Hives   Penicillins Hives and Rash    Tolerated cephalosporins including ancef  Has patient had a PCN reaction causing immediate rash, facial/tongue/throat swelling, SOB or lightheadedness with hypotension: Unknown Has patient had a PCN reaction causing severe rash involving mucus membranes or skin necrosis: Unknown Has patient had a PCN reaction that required hospitalization: Unknown Has patient had a PCN reaction occurring within the last 10 years: Unknown If all of the above answers are "NO", then may     Thank you for allowing pharmacy to be a part of this patient's care.  11/10/20 11/08/2020 11:59 AM
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
## 2020-11-08 NOTE — ED Notes (Signed)
Rito Ehrlich, MD made aware patient potassium is now 5.7, new order received to change IV fluids. When getting patient changed for [facility] arrival noticed patient has hives to BLE and chest.  Rito Ehrlich, MD made aware and new orders received.

## 2020-11-08 NOTE — ED Notes (Signed)
Pt continues taking all monitoring devices off. Pt trying to climb out of the bed. Pt urinated in the bed. Pt cleaned up

## 2020-11-08 NOTE — Sepsis Progress Note (Signed)
Following per sepsis protocol   

## 2020-11-08 NOTE — Progress Notes (Signed)
PHARMACY - PHYSICIAN COMMUNICATION CRITICAL VALUE ALERT - BLOOD CULTURE IDENTIFICATION (BCID)  Alyssa Little is an 40 y.o. female who presented to [facility] on 11/07/2020 with a chief complaint of possible meningitis  Assessment: 1 of 4 bottles from blood cultures growing staph species so can be presumed contaminant  Name of physician (or Provider) Contacted: Rito Ehrlich  Current antibiotics: rocephin, vanc  Changes to prescribed antibiotics recommended:  No changes  Results for orders placed or performed during the hospital encounter of 11/07/20  Blood Culture ID Panel (Reflexed) (Collected: 11/07/2020 10:20 PM)  Result Value Ref Range   Enterococcus faecalis NOT DETECTED NOT DETECTED   Enterococcus Faecium NOT DETECTED NOT DETECTED   Listeria monocytogenes NOT DETECTED NOT DETECTED   Staphylococcus species DETECTED (A) NOT DETECTED   Staphylococcus aureus (BCID) NOT DETECTED NOT DETECTED   Staphylococcus epidermidis NOT DETECTED NOT DETECTED   Staphylococcus lugdunensis NOT DETECTED NOT DETECTED   Streptococcus species NOT DETECTED NOT DETECTED   Streptococcus agalactiae NOT DETECTED NOT DETECTED   Streptococcus pneumoniae NOT DETECTED NOT DETECTED   Streptococcus pyogenes NOT DETECTED NOT DETECTED   A.calcoaceticus-baumannii NOT DETECTED NOT DETECTED   Bacteroides fragilis NOT DETECTED NOT DETECTED   Enterobacterales NOT DETECTED NOT DETECTED   Enterobacter cloacae complex NOT DETECTED NOT DETECTED   Escherichia coli NOT DETECTED NOT DETECTED   Klebsiella aerogenes NOT DETECTED NOT DETECTED   Klebsiella oxytoca NOT DETECTED NOT DETECTED   Klebsiella pneumoniae NOT DETECTED NOT DETECTED   Proteus species NOT DETECTED NOT DETECTED   Salmonella species NOT DETECTED NOT DETECTED   Serratia marcescens NOT DETECTED NOT DETECTED   Haemophilus influenzae NOT DETECTED NOT DETECTED   Neisseria meningitidis NOT DETECTED NOT DETECTED   Pseudomonas aeruginosa NOT DETECTED NOT  DETECTED   Stenotrophomonas maltophilia NOT DETECTED NOT DETECTED   Candida albicans NOT DETECTED NOT DETECTED   Candida auris NOT DETECTED NOT DETECTED   Candida glabrata NOT DETECTED NOT DETECTED   Candida krusei NOT DETECTED NOT DETECTED   Candida parapsilosis NOT DETECTED NOT DETECTED   Candida tropicalis NOT DETECTED NOT DETECTED   Cryptococcus neoformans/gattii NOT DETECTED NOT DETECTED    Berkley Harvey 11/08/2020  5:09 PM

## 2020-11-08 NOTE — Plan of Care (Signed)
Discussed with admitting Dr. Julian Reil. Concern for CNS infection given fever, AMS, prior h/o bacteremia. Although her presentation might be all related to post ictal lethargy and confusion, it is OK to cover with abx for meningitis unless there is another clear source. Asked to request ER to LP patient but she is too agitated and can not undergo LP.   We will reassess in the AM.  -- Milon Dikes, MD Neurologist [facility] Pager: [phone]

## 2020-11-08 NOTE — Progress Notes (Signed)
Pharmacy Antibiotic Note  Alyssa Little is a 40 y.o. female admitted on 11/07/2020 with past medical history of TBI, seizure disorder, past history of subarachnoid hemorrhage, alcohol abuse, noncompliance to medication.  Brought in for evaluation after seizure activity today.  Pharmacy has been consulted to dose vancomycin and aztreonam for sepsis.  Plan: Vancomycin 1gm IV x 1 then 750mg  q24h (AUC 469.6, Scr 0.8, TBW) Azactam 2gm x 1 then 1gm q8h Follow renal function, cultures and clinical course  Height: 5\' 2"  (157.5 cm) Weight: 40.8 kg (90 lb) IBW/kg (Calculated) : 50.1  Temp (24hrs), Avg:100.5 F (38.1 C), Min:100.5 F (38.1 C), Max:100.5 F (38.1 C)  Recent Labs  Lab 11/07/20 2142 11/07/20 2220  WBC  --  10.9*  CREATININE  --  0.48  LATICACIDVEN 1.8  --     Estimated Creatinine Clearance: 60.2 mL/min (by C-G formula based on SCr of 0.48 mg/dL).    Allergies  Allergen Reactions   Shellfish Allergy Anaphylaxis   Flagyl [Metronidazole] Hives   Penicillins Other (See Comments)    Unknown per family and friend Tolerated cephalosporins including ancef  Has patient had a PCN reaction causing immediate rash, facial/tongue/throat swelling, SOB or lightheadedness with hypotension: Unknown Has patient had a PCN reaction causing severe rash involving mucus membranes or skin necrosis: Unknown Has patient had a PCN reaction that required hospitalization: Unknown Has patient had a PCN reaction occurring within the last 10 years: Unknown If all of the above answers are "NO", then may   Shellfish Allergy Other (See Comments)    Unknown per family and friend   Penicillins Hives and Rash    Antimicrobials this admission: 7/26 vanc >> 7/26 azactam >> 7/25 flagyl x 1, had rxn  Dose adjustments this admission:   Microbiology results: 7/25 BCx:    Thank you for allowing pharmacy to be a part of this patient's care.  8/25 RPh 11/08/2020, 12:36 AM

## 2020-11-08 NOTE — Progress Notes (Signed)
Dr. Rito Ehrlich made aware that EEG will possibly have to be done tomorrow due to EEG team short staffed today.

## 2020-11-08 NOTE — H&P (Signed)
History and Physical    Alyssa Little ZOX:[id number] DOB: [birth year] DOA: 11/07/2020  PCP: Pcp, No  Patient coming from: Home  I have personally briefly reviewed patient's old medical records in [facility]  Chief Complaint: Seizure(s) AMS  HPI: Alyssa Little is a 40 y.o. female with medical history significant of TBI, prior SAH, seizure disorder noncompliant with meds, EtOH abuse.  LKW ~1030am.  Had 4-5 min seizure witnessed by boyfriend.  AFter this she came around, he left her in bed to rest.  Came back around 8pm and found her down on ground by kitchen - no witnessed activity in afternoon.  Pt brought in by BF to [facility].  Boy friend reports: 1) she doesn't take her seizure meds 2) does drink at times, not clear how much 3) Rash on face is rosacia, is chronic and not new.  Review of chart: H/o admit for seizure and AMS in Dec 2020, also had fever that admit, found to have MSSA bacteremia on 1/2 BCx that admit treated with Rocephin.  2d echo neg for endocarditis.  ED Course: Pt continues to have AMS in ED.  Tm 100.5  HR 121.  WBC 10.9k  UA neg, CXR neg.  COVID and flu neg.  UDS neg.  BAL = 0  Anion gap 19, lactate NL, BHB 2.0  CT head: no acute findings  CT chest, abd, pelvis: 1) age indeterminate mild compression deformities of T7 and T10 2) old rib fractures 3) no other acute findings   Review of Systems: Unable to perform due to AMS.  Past Medical History:  Diagnosis Date   Alcohol abuse    SAH (subarachnoid hemorrhage) (HCC)    Seizure disorder (HCC)    TBI (traumatic brain injury) (HCC)     Past Surgical History:  Procedure Laterality Date   CHOLECYSTECTOMY       reports that she has been smoking cigarettes. She has been smoking an average of .25 packs per day. She has never used smokeless tobacco. She reports current alcohol use of about 1.0 standard drink of alcohol per week. She reports that she does not use  drugs.  Allergies  Allergen Reactions   Shellfish Allergy Anaphylaxis   Flagyl [Metronidazole] Hives   Penicillins Other (See Comments)    Unknown per family and friend Tolerated cephalosporins including ancef  Has patient had a PCN reaction causing immediate rash, facial/tongue/throat swelling, SOB or lightheadedness with hypotension: Unknown Has patient had a PCN reaction causing severe rash involving mucus membranes or skin necrosis: Unknown Has patient had a PCN reaction that required hospitalization: Unknown Has patient had a PCN reaction occurring within the last 10 years: Unknown If all of the above answers are "NO", then may   Shellfish Allergy Other (See Comments)    Unknown per family and friend   Penicillins Hives and Rash    Family History  Family history unknown: Yes  Pt non-verbal currently and altered, unable to provide any family history.   Prior to Admission medications   Medication Sig Start Date End Date Taking? Authorizing Provider  acetaminophen (TYLENOL) 500 MG tablet Take 500 mg by mouth every 6 (six) hours as needed (Headaches and muscle aches.).    [provider]  levETIRAcetam (KEPPRA) 750 MG tablet Take 2 tablets (1,500 mg total) by mouth 2 (two) times daily. Patient not taking: Reported on 03/28/2019 09/07/18   Garlon Hatchet, PA-C  Multiple Vitamin (MULTIVITAMIN WITH MINERALS) TABS tablet Take 1 tablet by  mouth daily. Patient not taking: Reported on 03/28/2019 03/11/18   Angiulli, Mcarthur Rossetti, PA-C  phenytoin (DILANTIN) 125 MG/5ML suspension Take 6 mLs (150 mg total) by mouth 3 (three) times daily. Patient not taking: Reported on 09/06/2018 03/11/18   Charlton Amor, PA-C    Physical Exam: Vitals:   11/08/20 0130 11/08/20 0131 11/08/20 0145 11/08/20 0152  BP: (!) 151/101 (!) 137/102 (!) 112/93   Pulse: 96 (!) 101 (!) 113   Resp: 17  20   Temp:    98.4 F (36.9 C)  TempSrc:    Oral  SpO2: 100%  98%   Weight:      Height:         Constitutional: confused, mild-mod agitated, moving around bed. Eyes: PERRL, lids and conjunctivae normal ENMT: Mucous membranes are moist. Posterior pharynx clear of any exudate or lesions.Normal dentition.  Neck: normal, supple without obvious meningismus, no masses, no thyromegaly Respiratory: clear to auscultation bilaterally, no wheezing, no crackles. Normal respiratory effort. No accessory muscle use.  Cardiovascular: Mild tachycardia Abdomen: no tenderness, no masses palpated. No hepatosplenomegaly. Bowel sounds positive.  Musculoskeletal: no clubbing / cyanosis. No joint deformity upper and lower extremities. Good ROM, no contractures. Normal muscle tone.  Skin: Has rosacia on face; had hives after flagyl though these have mostly resolved.  Bruising to both knees. Neurologic: MAE, non-verbal, not following commands, movements are purposeful though Psychiatric: Non-verbal, not following commands, eyes open spontaneously   Labs on Admission: I have personally reviewed following labs and imaging studies  CBC: Recent Labs  Lab 11/07/20 2220  WBC 10.9*  NEUTROABS 9.6*  HGB 13.2  HCT 38.4  MCV 105.8*  PLT 185   Basic Metabolic Panel: Recent Labs  Lab 11/07/20 2220  NA 137  K 3.2*  CL 95*  CO2 23  GLUCOSE 114*  BUN 7  CREATININE 0.48  CALCIUM 8.9  MG 1.6*  PHOS 2.4*   GFR: Estimated Creatinine Clearance: 60.2 mL/min (by C-G formula based on SCr of 0.48 mg/dL). Liver Function Tests: Recent Labs  Lab 11/07/20 2220  AST 45*  ALT 25  ALKPHOS 94  BILITOT 1.2  PROT 7.9  ALBUMIN 4.1   No results for input(s): LIPASE, AMYLASE in the last 168 hours. No results for input(s): AMMONIA in the last 168 hours. Coagulation Profile: Recent Labs  Lab 11/07/20 2220  INR 0.9   Cardiac Enzymes: Recent Labs  Lab 11/07/20 2220  CKTOTAL 550*   BNP (last 3 results) No results for input(s): PROBNP in the last 8760 hours. HbA1C: No results for input(s): HGBA1C in  the last 72 hours. CBG: Recent Labs  Lab 11/07/20 2156  GLUCAP 122*   Lipid Profile: No results for input(s): CHOL, HDL, LDLCALC, TRIG, CHOLHDL, LDLDIRECT in the last 72 hours. Thyroid Function Tests: No results for input(s): TSH, T4TOTAL, FREET4, T3FREE, THYROIDAB in the last 72 hours. Anemia Panel: No results for input(s): VITAMINB12, FOLATE, FERRITIN, TIBC, IRON, RETICCTPCT in the last 72 hours. Urine analysis:    Component Value Date/Time   COLORURINE YELLOW 11/07/2020 2220   APPEARANCEUR CLEAR 11/07/2020 2220   LABSPEC 1.018 11/07/2020 2220   PHURINE 6.0 11/07/2020 2220   GLUCOSEU NEGATIVE 11/07/2020 2220   HGBUR MODERATE (A) 11/07/2020 2220   BILIRUBINUR NEGATIVE 11/07/2020 2220   KETONESUR 80 (A) 11/07/2020 2220   PROTEINUR 100 (A) 11/07/2020 2220   NITRITE NEGATIVE 11/07/2020 2220   LEUKOCYTESUR NEGATIVE 11/07/2020 2220    Radiological Exams on Admission: CT Head Wo Contrast  Result Date: 11/07/2020 CLINICAL DATA:  Witnessed seizure, persisting confusion EXAM: CT HEAD WITHOUT CONTRAST CT CERVICAL SPINE WITHOUT CONTRAST TECHNIQUE: Multidetector CT imaging of the head and cervical spine was performed following the standard protocol without intravenous contrast. Multiplanar CT image reconstructions of the cervical spine were also generated. COMPARISON:  CT head 03/05/2018, CT head and cervical spine 03/26/2019 FINDINGS: CT HEAD FINDINGS Brain: Remote encephalomalacia in the right anterior frontal lobe/superior frontal gyrus compatible with site of prior hemorrhage. No evidence of acute infarction, hemorrhage, hydrocephalus, extra-axial collection, visible mass lesion or mass effect. Slightly age advanced generalized parenchymal atrophy. Basal cisterns are patent. Midline intracranial structures are unremarkable. Vascular: No hyperdense vessel or unexpected calcification. Skull: Multiple areas of scalp thickening noted in the left frontal, bilateral parietal and right occipital  scalp. No subjacent calvarial fracture or acute osseous injury is seen. Sinuses/Orbits: Paranasal sinuses and mastoid air cells are predominantly clear. Included orbital structures are unremarkable. Other: None CT CERVICAL SPINE FINDINGS Alignment: Straightening and slight reversal the normal cervical lordosis. Possibly related to cervical flexion noted on scout view. No evidence of traumatic listhesis. No abnormally widened, perched or jumped facets. Normal alignment of the craniocervical and atlantoaxial articulations. Skull base and vertebrae: Motion artifact may limit detection of subtle injury and mimic cortical step-off, particularly involving the C2-C6 levels. No acute skull base fracture. No vertebral body fracture or height loss. Normal bone mineralization. No worrisome osseous lesions. Soft tissues and spinal canal: No pre or paravertebral fluid or swelling. No visible canal hematoma. Airways patent. No worrisome cervical adenopathy. Disc levels: No significant central canal or foraminal stenosis identified within the imaged levels of the spine. Upper chest: No acute abnormality in the upper chest or imaged lung apices. Other: Normal thyroid. IMPRESSION: Multiple sites of scalp swelling and thickening, correlate for contusive changes. No calvarial fracture. No acute intracranial abnormality. Focus of encephalomalacia in the right anterior frontal lobe/superior frontal gyrus compatible with site of remote hemorrhage. Slightly age advanced parenchymal volume loss. No acute cervical spine fracture or traumatic listhesis. Evaluation limited by motion artifact, most pronounced C3-C6. Electronically Signed   By: Kreg Shropshire M.D.   On: 11/07/2020 21:44   CT Chest W Contrast  Result Date: 11/07/2020 CLINICAL DATA:  40 year old female with trauma. EXAM: CT CHEST, ABDOMEN, AND PELVIS WITH CONTRAST TECHNIQUE: Multidetector CT imaging of the chest, abdomen and pelvis was performed following the standard protocol  during bolus administration of intravenous contrast. CONTRAST:  17mL OMNIPAQUE IOHEXOL 350 MG/ML SOLN COMPARISON:  Chest and pelvic radiograph dated 11/07/2020. FINDINGS: CT CHEST FINDINGS Cardiovascular: Top-normal cardiac size. No pericardial effusion. The thoracic aorta is unremarkable. The origins of the great vessels of the aortic arch appear patent. The central pulmonary arteries appear unremarkable. Mediastinum/Nodes: No hilar or mediastinal adenopathy. The esophagus and the thyroid gland are grossly unremarkable. No mediastinal fluid collection. Lungs/Pleura: The lungs are clear. There is no pleural effusion pneumothorax. The central airways are patent. Musculoskeletal: Multiple subacute or chronic left rib fractures as well as several old healed right rib fractures. There are however compression fractures of superior endplates of T7 and T10 with less than 20% loss of vertebral body height and. These are age indeterminate but acute fractures are not excluded correlation with clinical exam and point tenderness recommended. MRI may provide better evaluation if clinically indicated. No retropulsion. CT ABDOMEN PELVIS FINDINGS No intra-abdominal free air or free fluid. Hepatobiliary: No focal liver abnormality is seen. No gallstones, gallbladder wall thickening, or biliary dilatation. Pancreas:  Unremarkable. No pancreatic ductal dilatation or surrounding inflammatory changes. Spleen: Normal in size without focal abnormality. Adrenals/Urinary Tract: The adrenal glands unremarkable. The kidneys, visualized ureters, and urinary bladder appear unremarkable. Stomach/Bowel: There is no bowel obstruction or active inflammation. The appendix is normal. Vascular/Lymphatic: Mild noncalcified plaque of the abdominal aorta. The IVC is unremarkable. No portal venous gas. There is no adenopathy. Reproductive: The uterus and ovaries are grossly unremarkable. Other: None Musculoskeletal: No acute osseous pathology. IMPRESSION:  1. Age indeterminate, possibly acute or subacute, mild compression fractures of the superior endplates of T7 and T10. Correlation with point tenderness recommended. 2. Multiple subacute or old left rib fractures as well as several old healed right rib fractures. 3. No acute/traumatic intra-abdominal or pelvic pathology. 4. Aortic Atherosclerosis (ICD10-I70.0). Electronically Signed   By: Elgie CollardArash  Radparvar M.D.   On: 11/07/2020 23:58   CT Cervical Spine Wo Contrast  Result Date: 11/07/2020 CLINICAL DATA:  Witnessed seizure, persisting confusion EXAM: CT HEAD WITHOUT CONTRAST CT CERVICAL SPINE WITHOUT CONTRAST TECHNIQUE: Multidetector CT imaging of the head and cervical spine was performed following the standard protocol without intravenous contrast. Multiplanar CT image reconstructions of the cervical spine were also generated. COMPARISON:  CT head 03/05/2018, CT head and cervical spine 03/26/2019 FINDINGS: CT HEAD FINDINGS Brain: Remote encephalomalacia in the right anterior frontal lobe/superior frontal gyrus compatible with site of prior hemorrhage. No evidence of acute infarction, hemorrhage, hydrocephalus, extra-axial collection, visible mass lesion or mass effect. Slightly age advanced generalized parenchymal atrophy. Basal cisterns are patent. Midline intracranial structures are unremarkable. Vascular: No hyperdense vessel or unexpected calcification. Skull: Multiple areas of scalp thickening noted in the left frontal, bilateral parietal and right occipital scalp. No subjacent calvarial fracture or acute osseous injury is seen. Sinuses/Orbits: Paranasal sinuses and mastoid air cells are predominantly clear. Included orbital structures are unremarkable. Other: None CT CERVICAL SPINE FINDINGS Alignment: Straightening and slight reversal the normal cervical lordosis. Possibly related to cervical flexion noted on scout view. No evidence of traumatic listhesis. No abnormally widened, perched or jumped facets.  Normal alignment of the craniocervical and atlantoaxial articulations. Skull base and vertebrae: Motion artifact may limit detection of subtle injury and mimic cortical step-off, particularly involving the C2-C6 levels. No acute skull base fracture. No vertebral body fracture or height loss. Normal bone mineralization. No worrisome osseous lesions. Soft tissues and spinal canal: No pre or paravertebral fluid or swelling. No visible canal hematoma. Airways patent. No worrisome cervical adenopathy. Disc levels: No significant central canal or foraminal stenosis identified within the imaged levels of the spine. Upper chest: No acute abnormality in the upper chest or imaged lung apices. Other: Normal thyroid. IMPRESSION: Multiple sites of scalp swelling and thickening, correlate for contusive changes. No calvarial fracture. No acute intracranial abnormality. Focus of encephalomalacia in the right anterior frontal lobe/superior frontal gyrus compatible with site of remote hemorrhage. Slightly age advanced parenchymal volume loss. No acute cervical spine fracture or traumatic listhesis. Evaluation limited by motion artifact, most pronounced C3-C6. Electronically Signed   By: Kreg ShropshirePrice  DeHay M.D.   On: 11/07/2020 21:44   CT ABDOMEN PELVIS W CONTRAST  Result Date: 11/07/2020 CLINICAL DATA:  40 year old female with trauma. EXAM: CT CHEST, ABDOMEN, AND PELVIS WITH CONTRAST TECHNIQUE: Multidetector CT imaging of the chest, abdomen and pelvis was performed following the standard protocol during bolus administration of intravenous contrast. CONTRAST:  80mL OMNIPAQUE IOHEXOL 350 MG/ML SOLN COMPARISON:  Chest and pelvic radiograph dated 11/07/2020. FINDINGS: CT CHEST FINDINGS Cardiovascular: Top-normal cardiac size. No  pericardial effusion. The thoracic aorta is unremarkable. The origins of the great vessels of the aortic arch appear patent. The central pulmonary arteries appear unremarkable. Mediastinum/Nodes: No hilar or  mediastinal adenopathy. The esophagus and the thyroid gland are grossly unremarkable. No mediastinal fluid collection. Lungs/Pleura: The lungs are clear. There is no pleural effusion pneumothorax. The central airways are patent. Musculoskeletal: Multiple subacute or chronic left rib fractures as well as several old healed right rib fractures. There are however compression fractures of superior endplates of T7 and T10 with less than 20% loss of vertebral body height and. These are age indeterminate but acute fractures are not excluded correlation with clinical exam and point tenderness recommended. MRI may provide better evaluation if clinically indicated. No retropulsion. CT ABDOMEN PELVIS FINDINGS No intra-abdominal free air or free fluid. Hepatobiliary: No focal liver abnormality is seen. No gallstones, gallbladder wall thickening, or biliary dilatation. Pancreas: Unremarkable. No pancreatic ductal dilatation or surrounding inflammatory changes. Spleen: Normal in size without focal abnormality. Adrenals/Urinary Tract: The adrenal glands unremarkable. The kidneys, visualized ureters, and urinary bladder appear unremarkable. Stomach/Bowel: There is no bowel obstruction or active inflammation. The appendix is normal. Vascular/Lymphatic: Mild noncalcified plaque of the abdominal aorta. The IVC is unremarkable. No portal venous gas. There is no adenopathy. Reproductive: The uterus and ovaries are grossly unremarkable. Other: None Musculoskeletal: No acute osseous pathology. IMPRESSION: 1. Age indeterminate, possibly acute or subacute, mild compression fractures of the superior endplates of T7 and T10. Correlation with point tenderness recommended. 2. Multiple subacute or old left rib fractures as well as several old healed right rib fractures. 3. No acute/traumatic intra-abdominal or pelvic pathology. 4. Aortic Atherosclerosis (ICD10-I70.0). Electronically Signed   By: Elgie Collard M.D.   On: 11/07/2020 23:58    DG Pelvis Portable  Result Date: 11/07/2020 CLINICAL DATA:  Status post fall. EXAM: PORTABLE PELVIS 1-2 VIEWS COMPARISON:  None. FINDINGS: There is no evidence of pelvic fracture or diastasis. No pelvic bone lesions are seen. IMPRESSION: Negative. Electronically Signed   By: Aram Candela M.D.   On: 11/07/2020 22:33   DG Chest Port 1 View  Result Date: 11/07/2020 CLINICAL DATA:  We, seizures EXAM: PORTABLE CHEST 1 VIEW COMPARISON:  03/26/2019 FINDINGS: The patient is rotated resulting in a widened appearance of the mediastinum. There is no focal airspace consolidation. There are multiple rib injury bilaterally, including the anterior left third and fourth ribs on the left, and the posterolateral sixth through eighth ribs. There is no visible pneumothorax. No large pleural effusion. IMPRESSION: Multiple bilateral rib fractures including ribs 3 and 4 on the left and ribs 6 through 8 on the right. No visible pneumothorax. No focal airspace disease. Electronically Signed   By: Caprice Renshaw   On: 11/07/2020 21:41    EKG: Independently reviewed.  Assessment/Plan Principal Problem:   Post-ictal state (HCC) Active Problems:   Seizures (HCC)   ETOH abuse   TBI (traumatic brain injury) (HCC)   Altered mental status   SIRS (systemic inflammatory response syndrome) (HCC)   Alcoholic ketoacidosis    Altered mental status - Likely post-ictal state DDx includes delirium due to SIRS, and EtOH withdrawal. Seizures - Likely due to: Not taking home seizure meds (she is "anti med" and never takes them according to boyfriend). EtOH withdrawal also possibly playing a role Fever may be lowering seizure threshold as well. Keppra 1g load then 1.5g BID IV for the moment Fosphenytoin load then dosing per pharm Also putting pt on CIWA in case  EtOH withdrawal is driving some of these See Dr. Wilford Corner note Admitting to [facility] EEG per Dr. Wilford Corner SIRS - Unclear source of infection Sepsis pathway IVF Empiric  rocephin + vanc, rocephin at CNS dose coverage for the moment given unclear source. UA neg COVID and flu neg CT chest, abd, pelvis, and head neg H/o fever with seizures in Dec 2020: had MSSA bacteremia on 1/2 BCx that admit. EtOH abuse - Concern for withdrawals today CIWA Given AMS: also giving dose of 500mg  IV thiamine x1 (on top of daily dosing) EtOH ketoacidosis - Vs DDx of starvation ketosis AG of 19, with BHB of 2.0 suggestive of mild ketoacidosis in this patient who has no history of DM and normal BGL. Use D5 half NS as IVF until anion gap closed Repeat BMP in AM Hypokalemia, hypomag - Replacing K and mag Repeat BMP in AM Hypo phos Repeat phos level in AM Unable to take POs due to AMS Only 0.1 low PO4, will hold off on IV phos for the moment, replace if it drops significantly with the D5 compression fx of T7-T10 - Unclear if acute vs chronic Mild compression fractures  DVT prophylaxis: Lovenox Code Status: Full Family Communication: Boyfriend at bedside Disposition Plan: Home after clinical improvement Consults called: Dr. Admission status: Admit to inpatient  Severity of Illness: The appropriate patient status for this patient is INPATIENT. Inpatient status is judged to be reasonable and necessary in order to provide the required intensity of service to ensure the patient's safety. The patient's presenting symptoms, physical exam findings, and initial radiographic and laboratory data in the context of their chronic comorbidities is felt to place them at high risk for further clinical deterioration. Furthermore, it is not anticipated that the patient will be medically stable for discharge from the hospital within 2 midnights of admission. The following factors support the patient status of inpatient.   IP status due to: 1) acute encephalopathy from seizures, not currently able to take POs 2) SIRS from unclear source 3) suspected EtOH withdrawal contributing to  delirium.  * I certify that at the point of admission it is my clinical judgment that the patient will require inpatient hospital care spanning beyond 2 midnights from the point of admission due to high intensity of service, high risk for further deterioration and high frequency of surveillance required.*   Columbia Pandey M. DO [facility]  How to contact the [facility] Attending or Consulting provider 7A - 7P or covering provider during after hours 7P -7A, for this patient?  Check the care team in [facility] and look for a) attending/consulting [facility] provider listed and b) the [facility] team listed Log into [email or web address]  Amion Physician Scheduling and messaging for groups and whole hospitals  On call and physician scheduling software for group practices, residents, hospitalists and other medical providers for call, clinic, rotation and shift schedules. On[employer] is a hospital-wide system for scheduling doctors and paging doctors on call. EasyPlot is for scientific plotting and data analysis.  [email or web address]  and use [facility] universal password to access. If you do not have the password, please contact the hospital operator.  Locate the [facility] provider you are looking for under [facility] and page to a number that you can be directly reached. If you still have difficulty reaching the provider, please page the [facility] (Director on Call) for the Hospitalists listed on amion for assistance.  11/08/2020, 2:06 AM

## 2020-11-08 NOTE — ED Notes (Signed)
Flagyl stopped, pt has a rash to her right side and on her chest. Pt is itching. Dr. Nicanor Alcon notified

## 2020-11-08 NOTE — ED Notes (Addendum)
Attempted to call report to 3w at [facility]. Per staff, they have not approved or looked at the patient yet.

## 2020-11-09 ENCOUNTER — Inpatient Hospital Stay (HOSPITAL_COMMUNITY): Payer: Self-pay

## 2020-11-09 LAB — COMPREHENSIVE METABOLIC PANEL
ALT: 18 U/L (ref 0–44)
AST: 39 U/L (ref 15–41)
Albumin: 3 g/dL — ABNORMAL LOW (ref 3.5–5.0)
Alkaline Phosphatase: 76 U/L (ref 38–126)
Anion gap: 15 (ref 5–15)
BUN: <5 mg/dL — ABNORMAL LOW (ref 6–20)
CO2: 17 mmol/L — ABNORMAL LOW (ref 22–32)
Calcium: 7.7 mg/dL — ABNORMAL LOW (ref 8.9–10.3)
Chloride: 103 mmol/L (ref 98–111)
Creatinine, Ser: 0.58 mg/dL (ref 0.44–1.00)
GFR, Estimated: >60 mL/min (ref >60)
Glucose, Bld: 114 mg/dL — ABNORMAL HIGH (ref 70–99)
Potassium: 3 mmol/L — ABNORMAL LOW (ref 3.5–5.1)
Sodium: 135 mmol/L (ref 135–145)
Total Bilirubin: 1.1 mg/dL (ref 0.3–1.2)
Total Protein: 6.2 g/dL — ABNORMAL LOW (ref 6.5–8.1)

## 2020-11-09 LAB — CBC
HCT: 35.2 % — ABNORMAL LOW (ref 36.0–46.0)
Hemoglobin: 11.6 g/dL — ABNORMAL LOW (ref 12.0–15.0)
MCH: 35.8 pg — ABNORMAL HIGH (ref 26.0–34.0)
MCHC: 33 g/dL (ref 30.0–36.0)
MCV: 108.6 fL — ABNORMAL HIGH (ref 80.0–100.0)
Platelets: 173 10*3/uL (ref 150–400)
RBC: 3.24 MIL/uL — ABNORMAL LOW (ref 3.87–5.11)
RDW: 14 % (ref 11.5–15.5)
WBC: 8.9 10*3/uL (ref 4.0–10.5)
nRBC: 0 % (ref 0.0–0.2)

## 2020-11-09 LAB — PHOSPHORUS: Phosphorus: 1.7 mg/dL — ABNORMAL LOW (ref 2.5–4.6)

## 2020-11-09 LAB — CK: Total CK: 363 U/L — ABNORMAL HIGH (ref 38–234)

## 2020-11-09 LAB — MAGNESIUM: Magnesium: 1.5 mg/dL — ABNORMAL LOW (ref 1.7–2.4)

## 2020-11-09 MED ORDER — POTASSIUM CHLORIDE CRYS ER 20 MEQ PO TBCR
40.0000 meq | EXTENDED_RELEASE_TABLET | Freq: Two times a day (BID) | ORAL | Status: AC
  Administered 2020-11-09 (×2): 40 meq via ORAL
  Filled 2020-11-09 (×2): qty 2

## 2020-11-09 MED ORDER — MAGNESIUM SULFATE 2 GM/50ML IV SOLN
2.0000 g | Freq: Once | INTRAVENOUS | Status: AC
  Administered 2020-11-09: 2 g via INTRAVENOUS
  Filled 2020-11-09: qty 50

## 2020-11-09 MED ORDER — POTASSIUM PHOSPHATES 15 MMOLE/5ML IV SOLN
30.0000 mmol | Freq: Once | INTRAVENOUS | Status: AC
  Administered 2020-11-09: 30 mmol via INTRAVENOUS
  Filled 2020-11-09: qty 10

## 2020-11-09 MED ORDER — ENSURE ENLIVE PO LIQD
237.0000 mL | Freq: Two times a day (BID) | ORAL | Status: DC
  Administered 2020-11-09 – 2020-11-11 (×5): 237 mL via ORAL

## 2020-11-09 NOTE — Progress Notes (Signed)
Progress Note    Alyssa Little  [facility]MC:[id number] DOB: Sep 19, 1980  DOA: 11/07/2020 PCP: Pcp, No    Brief Narrative:     Medical records reviewed and are as summarized below:  Alyssa Little is an 40 y.o. female with medical history significant of TBI, prior SAH, seizure disorder noncompliant with meds, EtOH abuse. Boy friend reports: 1) she doesn't take her seizure meds 2) does drink at times, not clear how much 3) Rash on face is rosacia, is chronic and not new.    Assessment/Plan:   Principal Problem:   Post-ictal state (HCC) Active Problems:   Seizures (HCC)   ETOH abuse   TBI (traumatic brain injury) (HCC)   Altered mental status   SIRS (systemic inflammatory response syndrome) (HCC)   Alcoholic ketoacidosis      Altered mental status - -Likely post-ictal state -currently requiring a sitter  Seizures - -Likely due to: Not taking home seizure meds (she is "anti med" and never takes them according to boyfriend). EtOH withdrawal also possibly playing a role Fever may be lowering seizure threshold as well. -Keppra 1g load then 1.5g BID IV for the moment -Fosphenytoin load then dosing per pharm -eeg: diffuse encephalopathy  fever - -Unclear source of infection: Based on clinical examination the likelihood of meningitis seems to be low.  Cannot do LP due to episodes of agitation which would make it very risky to do such a procedure. -Empiric rocephin + vanc, rocephin at CNS dose coverage for the moment given unclear source. -UA neg -COVID and flu neg -CT chest, abd, pelvis, and head neg -H/o fever with seizures in Dec 2020: had MSSA bacteremia on 1/2 BCx that admit. -blood cultures pending  EtOH abuse - -Concern for withdrawals today -CIWA  Hypokalemia, hypomag - -replete  Hypo phos -Replete  compression fx of T7-T10 - -Unclear if acute vs chronic -Mild compression fractures    Family Communication/Anticipated D/C date and  plan/Code Status   DVT prophylaxis: Lovenox ordered. Code Status: Full Code.  Family Communication: boyfriend at bedside Disposition Plan: Status is: Inpatient  Remains inpatient appropriate because:Inpatient level of care appropriate due to severity of illness  Dispo:  Patient From: Home  Planned Disposition: Home  Medically stable for discharge: No           Medical Consultants:   Neuro     Subjective:   Confused but seems improved from yesterday's assessment   Objective:    Vitals:   11/09/20 0345 11/09/20 0724 11/09/20 0923 11/09/20 1141  BP: 110/84 (!) 120/93 (!) 136/95 116/74  Pulse: (!) 101 (!) 117 (!) 117 (!) 128  Resp: 16 16 14 16   Temp: 97.9 F (36.6 C) 99 F (37.2 C) (!) 97.5 F (36.4 C) 98.8 F (37.1 C)  TempSrc:  Oral Axillary Oral  SpO2: 100% 100% 100% 96%  Weight:      Height:        Intake/Output Summary (Last 24 hours) at 11/09/2020 1353 Last data filed at 11/08/2020 1754 Gross per 24 hour  Intake 718.13 ml  Output --  Net 718.13 ml   Filed Weights   11/07/20 2235  Weight: 40.8 kg    Exam:  General: Appearance:    Thin female who appears older than stated age     Lungs:     respirations unlabored  Heart:    Tachycardic.   MS:   All extremities are intact.    Neurologic:   Awake, alert, impulsive  Data Reviewed:   I have personally reviewed following labs and imaging studies:  Labs: Labs show the following:   Basic Metabolic Panel: Recent Labs  Lab 11/07/20 2220 11/08/20 0613 11/08/20 1506 11/09/20 0319  NA 137 135  --  135  K 3.2* 2.7* 5.2* 3.0*  CL 95* 102  --  103  CO2 23 22  --  17*  GLUCOSE 114* 115*  --  114*  BUN 7 <5*  --  <5*  CREATININE 0.48 0.34*  --  0.58  CALCIUM 8.9 7.4*  --  7.7*  MG 1.6*  --   --  1.5*  PHOS 2.4* 1.3*  --  1.7*   GFR Estimated Creatinine Clearance: 60.2 mL/min (by C-G formula based on SCr of 0.58 mg/dL). Liver Function Tests: Recent Labs  Lab 11/07/20 2220  11/08/20 0613 11/09/20 0319  AST 45* 41 39  ALT ALKPHOS 94 82 76  BILITOT 1.2 1.1 1.1  PROT 7.9 6.8 6.2*  ALBUMIN 4.1 3.5 3.0*   No results for input(s): LIPASE, AMYLASE in the last 168 hours. No results for input(s): AMMONIA in the last 168 hours. Coagulation profile Recent Labs  Lab 11/07/20 2220  INR 0.9    CBC: Recent Labs  Lab 11/07/20 2220 11/08/20 0613 11/09/20 0319  WBC 10.9* 12.4* 8.9  NEUTROABS 9.6*  --   --   HGB 13.2 12.4 11.6*  HCT 38.4 37.0 35.2*  MCV 105.8* 108.2* 108.6*  PLT 185 156 173   Cardiac Enzymes: Recent Labs  Lab 11/07/20 2220 11/09/20 0319  CKTOTAL 550* 363*   BNP (last 3 results) No results for input(s): PROBNP in the last 8760 hours. CBG: Recent Labs  Lab 11/07/20 2156  GLUCAP 122*   D-Dimer: No results for input(s): DDIMER in the last 72 hours. Hgb A1c: No results for input(s): HGBA1C in the last 72 hours. Lipid Profile: No results for input(s): CHOL, HDL, LDLCALC, TRIG, CHOLHDL, LDLDIRECT in the last 72 hours. Thyroid function studies: No results for input(s): TSH, T4TOTAL, T3FREE, THYROIDAB in the last 72 hours.  Invalid input(s): FREET3 Anemia work up: Recent Labs    11/08/20 1927  VITAMINB12 320   Sepsis Labs: Recent Labs  Lab 11/07/20 2142 11/07/20 2220 11/08/20 0613 11/09/20 0319  PROCALCITON  --   --  <0.10  --   WBC  --  10.9* 12.4* 8.9  LATICACIDVEN 1.8  --   --   --     Microbiology Recent Results (from the past 240 hour(s))  Resp Panel by RT-PCR (Flu A&B, Covid) Nasopharyngeal Swab     Status: None   Collection Time: 11/07/20  9:42 PM   Specimen: Nasopharyngeal Swab; Nasopharyngeal(NP) swabs in vial transport medium  Result Value Ref Range Status   SARS Coronavirus 2 by RT PCR NEGATIVE NEGATIVE Final    Comment: (NOTE) SARS-CoV-2 target nucleic acids are NOT DETECTED.  The SARS-CoV-2 RNA is generally detectable in upper respiratory specimens during the acute phase of infection. The  lowest concentration of SARS-CoV-2 viral copies this assay can detect is 138 copies/mL. A negative result does not preclude SARS-Cov-2 infection and should not be used as the sole basis for treatment or other patient management decisions. A negative result may occur with  improper specimen collection/handling, submission of specimen other than nasopharyngeal swab, presence of viral mutation(s) within the areas targeted by this assay, and inadequate number of viral copies(<138 copies/mL). A negative result must be combined with clinical observations, patient  history, and epidemiological information. The expected result is Negative.  Fact Sheet for Patients:  BloggerCourse.com  Fact Sheet for Healthcare Providers:  SeriousBroker.it  This test is no t yet approved or cleared by the [place] FDA and  has been authorized for detection and/or diagnosis of SARS-CoV-2 by FDA under an Emergency Use Authorization (EUA). This EUA will remain  in effect (meaning this test can be used) for the duration of the COVID-19 declaration under Section 564(b)(1) of the Act, 21 U.S.C.section 360bbb-3(b)(1), unless the authorization is terminated  or revoked sooner.       Influenza A by PCR NEGATIVE NEGATIVE Final   Influenza B by PCR NEGATIVE NEGATIVE Final    Comment: (NOTE) The Xpert Xpress SARS-CoV-2/FLU/RSV plus assay is intended as an aid in the diagnosis of influenza from Nasopharyngeal swab specimens and should not be used as a sole basis for treatment. Nasal washings and aspirates are unacceptable for Xpert Xpress SARS-CoV-2/FLU/RSV testing.  Fact Sheet for Patients: BloggerCourse.com  Fact Sheet for Healthcare Providers: SeriousBroker.it  This test is not yet approved or cleared by the [place] FDA and has been authorized for detection and/or diagnosis of SARS-CoV-2 by FDA under  an Emergency Use Authorization (EUA). This EUA will remain in effect (meaning this test can be used) for the duration of the COVID-19 declaration under Section 564(b)(1) of the Act, 21 U.S.C. section 360bbb-3(b)(1), unless the authorization is terminated or revoked.  Performed at [facility], [address], [place], [place] [address]   Blood Culture (routine x 2)     Status: Abnormal (Preliminary result)   Collection Time: 11/07/20 10:20 PM   Specimen: BLOOD LEFT FOREARM  Result Value Ref Range Status   Specimen Description   Final    BLOOD LEFT FOREARM Performed at [facility], [address], [place], [place] [address]    Special Requests   Final    BOTTLES DRAWN AEROBIC AND ANAEROBIC Blood Culture adequate volume Performed at [facility], [address], [place], [place] [address]    Culture  Setup Time (A)  Final    GRAM VARIABLE ROD AEROBIC BOTTLE ONLY CRITICAL VALUE NOTED.  VALUE IS CONSISTENT WITH PREVIOUSLY REPORTED AND CALLED VALUE.    Culture (A)  Final    BACILLUS SPECIES Standardized susceptibility testing for this organism is not available. Performed at [facility], [address] [place], [place] [address]    Report Status PENDING  Incomplete  Blood Culture (routine x 2)     Status: Abnormal (Preliminary result)   Collection Time: 11/07/20 10:20 PM   Specimen: BLOOD LEFT FOREARM  Result Value Ref Range Status   Specimen Description   Final    BLOOD LEFT FOREARM Performed at [facility], [address], [place], [place] [address]    Special Requests   Final    BOTTLES DRAWN AEROBIC AND ANAEROBIC Blood Culture adequate volume Performed at [facility], [address] [place], [place] [address]    Culture  Setup Time   Final    ANAEROBIC BOTTLE ONLY GRAM VARIABLE ROD Organism ID to follow CRITICAL RESULT CALLED TO, READ BACK BY AND VERIFIED WITH: J LEGGE PHARMD 1708 11/08/20 A  BROWNING    Culture (A)  Final    BACILLUS SPECIES Standardized susceptibility testing for this organism is not available. STAPHYLOCOCCUS HOMINIS THE SIGNIFICANCE OF ISOLATING THIS ORGANISM FROM A SINGLE SET OF BLOOD CULTURES WHEN MULTIPLE SETS  ARE DRAWN IS UNCERTAIN. PLEASE NOTIFY THE MICROBIOLOGY DEPARTMENT WITHIN ONE WEEK IF SPECIATION AND SENSITIVITIES ARE REQUIRED. CRITICAL RESULT CALLED TO, READ BACK BY AND VERIFIED WITH: Enzo Montgomery [id number]  1027 FCP Performed at [facility], [address], [place], [place] [address]    Report Status PENDING  Incomplete  Blood Culture ID Panel (Reflexed)     Status: Abnormal   Collection Time: 11/07/20 10:20 PM  Result Value Ref Range Status   Enterococcus faecalis NOT DETECTED NOT DETECTED Final   Enterococcus Faecium NOT DETECTED NOT DETECTED Final   Listeria monocytogenes NOT DETECTED NOT DETECTED Final   Staphylococcus species DETECTED (A) NOT DETECTED Final    Comment: CRITICAL RESULT CALLED TO, READ BACK BY AND VERIFIED WITH: J LEGGE PHARMD 1708 11/08/20 A BROWNING    Staphylococcus aureus (BCID) NOT DETECTED NOT DETECTED Final   Staphylococcus epidermidis NOT DETECTED NOT DETECTED Final   Staphylococcus lugdunensis NOT DETECTED NOT DETECTED Final   Streptococcus species NOT DETECTED NOT DETECTED Final   Streptococcus agalactiae NOT DETECTED NOT DETECTED Final   Streptococcus pneumoniae NOT DETECTED NOT DETECTED Final   Streptococcus pyogenes NOT DETECTED NOT DETECTED Final   A.calcoaceticus-baumannii NOT DETECTED NOT DETECTED Final   Bacteroides fragilis NOT DETECTED NOT DETECTED Final   Enterobacterales NOT DETECTED NOT DETECTED Final   Enterobacter cloacae complex NOT DETECTED NOT DETECTED Final   Escherichia coli NOT DETECTED NOT DETECTED Final   Klebsiella aerogenes NOT DETECTED NOT DETECTED Final   Klebsiella oxytoca NOT DETECTED NOT DETECTED Final   Klebsiella pneumoniae NOT DETECTED NOT DETECTED Final   Proteus species  NOT DETECTED NOT DETECTED Final   Salmonella species NOT DETECTED NOT DETECTED Final   Serratia marcescens NOT DETECTED NOT DETECTED Final   Haemophilus influenzae NOT DETECTED NOT DETECTED Final   Neisseria meningitidis NOT DETECTED NOT DETECTED Final   Pseudomonas aeruginosa NOT DETECTED NOT DETECTED Final   Stenotrophomonas maltophilia NOT DETECTED NOT DETECTED Final   Candida albicans NOT DETECTED NOT DETECTED Final   Candida auris NOT DETECTED NOT DETECTED Final   Candida glabrata NOT DETECTED NOT DETECTED Final   Candida krusei NOT DETECTED NOT DETECTED Final   Candida parapsilosis NOT DETECTED NOT DETECTED Final   Candida tropicalis NOT DETECTED NOT DETECTED Final   Cryptococcus neoformans/gattii NOT DETECTED NOT DETECTED Final    Comment: Performed at [facility], [address], [place], [place] [address]    Procedures and diagnostic studies:  CT Head Wo Contrast  Result Date: 11/07/2020 CLINICAL DATA:  Witnessed seizure, persisting confusion EXAM: CT HEAD WITHOUT CONTRAST CT CERVICAL SPINE WITHOUT CONTRAST TECHNIQUE: Multidetector CT imaging of the head and cervical spine was performed following the standard protocol without intravenous contrast. Multiplanar CT image reconstructions of the cervical spine were also generated. COMPARISON:  CT head 03/05/2018, CT head and cervical spine 03/26/2019 FINDINGS: CT HEAD FINDINGS Brain: Remote encephalomalacia in the right anterior frontal lobe/superior frontal gyrus compatible with site of prior hemorrhage. No evidence of acute infarction, hemorrhage, hydrocephalus, extra-axial collection, visible mass lesion or mass effect. Slightly age advanced generalized parenchymal atrophy. Basal cisterns are patent. Midline intracranial structures are unremarkable. Vascular: No hyperdense vessel or unexpected calcification. Skull: Multiple areas of scalp thickening noted in the left frontal, bilateral parietal and right occipital scalp. No  subjacent calvarial fracture or acute osseous injury is seen. Sinuses/Orbits: Paranasal sinuses and mastoid air cells are predominantly clear. Included orbital structures are unremarkable. Other: None CT CERVICAL SPINE FINDINGS Alignment: Straightening  and slight reversal the normal cervical lordosis. Possibly related to cervical flexion noted on scout view. No evidence of traumatic listhesis. No abnormally widened, perched or jumped facets. Normal alignment of the craniocervical and atlantoaxial articulations. Skull base and vertebrae: Motion artifact may limit detection of subtle injury and mimic cortical step-off, particularly involving the C2-C6 levels. No acute skull base fracture. No vertebral body fracture or height loss. Normal bone mineralization. No worrisome osseous lesions. Soft tissues and spinal canal: No pre or paravertebral fluid or swelling. No visible canal hematoma. Airways patent. No worrisome cervical adenopathy. Disc levels: No significant central canal or foraminal stenosis identified within the imaged levels of the spine. Upper chest: No acute abnormality in the upper chest or imaged lung apices. Other: Normal thyroid. IMPRESSION: Multiple sites of scalp swelling and thickening, correlate for contusive changes. No calvarial fracture. No acute intracranial abnormality. Focus of encephalomalacia in the right anterior frontal lobe/superior frontal gyrus compatible with site of remote hemorrhage. Slightly age advanced parenchymal volume loss. No acute cervical spine fracture or traumatic listhesis. Evaluation limited by motion artifact, most pronounced C3-C6. Electronically Signed   By: Kreg ShropshirePrice  DeHay M.D.   On: 11/07/2020 21:44   CT Chest W Contrast  Result Date: 11/07/2020 CLINICAL DATA:  40 year old female with trauma. EXAM: CT CHEST, ABDOMEN, AND PELVIS WITH CONTRAST TECHNIQUE: Multidetector CT imaging of the chest, abdomen and pelvis was performed following the standard protocol during  bolus administration of intravenous contrast. CONTRAST:  80mL OMNIPAQUE IOHEXOL 350 MG/ML SOLN COMPARISON:  Chest and pelvic radiograph dated 11/07/2020. FINDINGS: CT CHEST FINDINGS Cardiovascular: Top-normal cardiac size. No pericardial effusion. The thoracic aorta is unremarkable. The origins of the great vessels of the aortic arch appear patent. The central pulmonary arteries appear unremarkable. Mediastinum/Nodes: No hilar or mediastinal adenopathy. The esophagus and the thyroid gland are grossly unremarkable. No mediastinal fluid collection. Lungs/Pleura: The lungs are clear. There is no pleural effusion pneumothorax. The central airways are patent. Musculoskeletal: Multiple subacute or chronic left rib fractures as well as several old healed right rib fractures. There are however compression fractures of superior endplates of T7 and T10 with less than 20% loss of vertebral body height and. These are age indeterminate but acute fractures are not excluded correlation with clinical exam and point tenderness recommended. MRI may provide better evaluation if clinically indicated. No retropulsion. CT ABDOMEN PELVIS FINDINGS No intra-abdominal free air or free fluid. Hepatobiliary: No focal liver abnormality is seen. No gallstones, gallbladder wall thickening, or biliary dilatation. Pancreas: Unremarkable. No pancreatic ductal dilatation or surrounding inflammatory changes. Spleen: Normal in size without focal abnormality. Adrenals/Urinary Tract: The adrenal glands unremarkable. The kidneys, visualized ureters, and urinary bladder appear unremarkable. Stomach/Bowel: There is no bowel obstruction or active inflammation. The appendix is normal. Vascular/Lymphatic: Mild noncalcified plaque of the abdominal aorta. The IVC is unremarkable. No portal venous gas. There is no adenopathy. Reproductive: The uterus and ovaries are grossly unremarkable. Other: None Musculoskeletal: No acute osseous pathology. IMPRESSION: 1. Age  indeterminate, possibly acute or subacute, mild compression fractures of the superior endplates of T7 and T10. Correlation with point tenderness recommended. 2. Multiple subacute or old left rib fractures as well as several old healed right rib fractures. 3. No acute/traumatic intra-abdominal or pelvic pathology. 4. Aortic Atherosclerosis (ICD10-I70.0). Electronically Signed   By: Elgie CollardArash  Radparvar M.D.   On: 11/07/2020 23:58   CT Cervical Spine Wo Contrast  Result Date: 11/07/2020 CLINICAL DATA:  Witnessed seizure, persisting confusion EXAM: CT HEAD WITHOUT CONTRAST CT  CERVICAL SPINE WITHOUT CONTRAST TECHNIQUE: Multidetector CT imaging of the head and cervical spine was performed following the standard protocol without intravenous contrast. Multiplanar CT image reconstructions of the cervical spine were also generated. COMPARISON:  CT head 03/05/2018, CT head and cervical spine 03/26/2019 FINDINGS: CT HEAD FINDINGS Brain: Remote encephalomalacia in the right anterior frontal lobe/superior frontal gyrus compatible with site of prior hemorrhage. No evidence of acute infarction, hemorrhage, hydrocephalus, extra-axial collection, visible mass lesion or mass effect. Slightly age advanced generalized parenchymal atrophy. Basal cisterns are patent. Midline intracranial structures are unremarkable. Vascular: No hyperdense vessel or unexpected calcification. Skull: Multiple areas of scalp thickening noted in the left frontal, bilateral parietal and right occipital scalp. No subjacent calvarial fracture or acute osseous injury is seen. Sinuses/Orbits: Paranasal sinuses and mastoid air cells are predominantly clear. Included orbital structures are unremarkable. Other: None CT CERVICAL SPINE FINDINGS Alignment: Straightening and slight reversal the normal cervical lordosis. Possibly related to cervical flexion noted on scout view. No evidence of traumatic listhesis. No abnormally widened, perched or jumped facets. Normal  alignment of the craniocervical and atlantoaxial articulations. Skull base and vertebrae: Motion artifact may limit detection of subtle injury and mimic cortical step-off, particularly involving the C2-C6 levels. No acute skull base fracture. No vertebral body fracture or height loss. Normal bone mineralization. No worrisome osseous lesions. Soft tissues and spinal canal: No pre or paravertebral fluid or swelling. No visible canal hematoma. Airways patent. No worrisome cervical adenopathy. Disc levels: No significant central canal or foraminal stenosis identified within the imaged levels of the spine. Upper chest: No acute abnormality in the upper chest or imaged lung apices. Other: Normal thyroid. IMPRESSION: Multiple sites of scalp swelling and thickening, correlate for contusive changes. No calvarial fracture. No acute intracranial abnormality. Focus of encephalomalacia in the right anterior frontal lobe/superior frontal gyrus compatible with site of remote hemorrhage. Slightly age advanced parenchymal volume loss. No acute cervical spine fracture or traumatic listhesis. Evaluation limited by motion artifact, most pronounced C3-C6. Electronically Signed   By: Kreg Shropshire M.D.   On: 11/07/2020 21:44   CT ABDOMEN PELVIS W CONTRAST  Result Date: 11/07/2020 CLINICAL DATA:  40 year old female with trauma. EXAM: CT CHEST, ABDOMEN, AND PELVIS WITH CONTRAST TECHNIQUE: Multidetector CT imaging of the chest, abdomen and pelvis was performed following the standard protocol during bolus administration of intravenous contrast. CONTRAST:  80mL OMNIPAQUE IOHEXOL 350 MG/ML SOLN COMPARISON:  Chest and pelvic radiograph dated 11/07/2020. FINDINGS: CT CHEST FINDINGS Cardiovascular: Top-normal cardiac size. No pericardial effusion. The thoracic aorta is unremarkable. The origins of the great vessels of the aortic arch appear patent. The central pulmonary arteries appear unremarkable. Mediastinum/Nodes: No hilar or mediastinal  adenopathy. The esophagus and the thyroid gland are grossly unremarkable. No mediastinal fluid collection. Lungs/Pleura: The lungs are clear. There is no pleural effusion pneumothorax. The central airways are patent. Musculoskeletal: Multiple subacute or chronic left rib fractures as well as several old healed right rib fractures. There are however compression fractures of superior endplates of T7 and T10 with less than 20% loss of vertebral body height and. These are age indeterminate but acute fractures are not excluded correlation with clinical exam and point tenderness recommended. MRI may provide better evaluation if clinically indicated. No retropulsion. CT ABDOMEN PELVIS FINDINGS No intra-abdominal free air or free fluid. Hepatobiliary: No focal liver abnormality is seen. No gallstones, gallbladder wall thickening, or biliary dilatation. Pancreas: Unremarkable. No pancreatic ductal dilatation or surrounding inflammatory changes. Spleen: Normal in size without focal  abnormality. Adrenals/Urinary Tract: The adrenal glands unremarkable. The kidneys, visualized ureters, and urinary bladder appear unremarkable. Stomach/Bowel: There is no bowel obstruction or active inflammation. The appendix is normal. Vascular/Lymphatic: Mild noncalcified plaque of the abdominal aorta. The IVC is unremarkable. No portal venous gas. There is no adenopathy. Reproductive: The uterus and ovaries are grossly unremarkable. Other: None Musculoskeletal: No acute osseous pathology. IMPRESSION: 1. Age indeterminate, possibly acute or subacute, mild compression fractures of the superior endplates of T7 and T10. Correlation with point tenderness recommended. 2. Multiple subacute or old left rib fractures as well as several old healed right rib fractures. 3. No acute/traumatic intra-abdominal or pelvic pathology. 4. Aortic Atherosclerosis (ICD10-I70.0). Electronically Signed   By: Elgie Collard M.D.   On: 11/07/2020 23:58   DG Pelvis  Portable  Result Date: 11/07/2020 CLINICAL DATA:  Status post fall. EXAM: PORTABLE PELVIS 1-2 VIEWS COMPARISON:  None. FINDINGS: There is no evidence of pelvic fracture or diastasis. No pelvic bone lesions are seen. IMPRESSION: Negative. Electronically Signed   By: Aram Candela M.D.   On: 11/07/2020 22:33   DG Chest Port 1 View  Result Date: 11/07/2020 CLINICAL DATA:  We, seizures EXAM: PORTABLE CHEST 1 VIEW COMPARISON:  03/26/2019 FINDINGS: The patient is rotated resulting in a widened appearance of the mediastinum. There is no focal airspace consolidation. There are multiple rib injury bilaterally, including the anterior left third and fourth ribs on the left, and the posterolateral sixth through eighth ribs. There is no visible pneumothorax. No large pleural effusion. IMPRESSION: Multiple bilateral rib fractures including ribs 3 and 4 on the left and ribs 6 through 8 on the right. No visible pneumothorax. No focal airspace disease. Electronically Signed   By: Caprice Renshaw   On: 11/07/2020 21:41   EEG adult  Result Date: 11/09/2020 Charlsie Quest, MD     11/09/2020  1:08 PM Patient Name: Ruben Pyka MRN: [id number] Epilepsy Attending: Charlsie Quest Referring Physician/Provider: Dr Caryl Pina Date: 11/09/2020 Duration: 21.41 mins Patient history: 40yo F with epilepsy, presented with breakthrough seizure. EEG to evaluate for seizure. Level of alertness: Awake AEDs during EEG study: LEV Technical aspects: This EEG study was done with scalp electrodes positioned according to the 10-20 International system of electrode placement. Electrical activity was acquired at a sampling rate of 500Hz  and reviewed with a high frequency filter of 70Hz  and a low frequency filter of 1Hz . EEG data were recorded continuously and digitally stored. Description: No posterior dominant rhythm consists of 9 Hz activity of moderate voltage (25-35 uV) seen predominantly in posterior head regions, symmetric and  reactive to eye opening and eye closing. EEG showed continuous generalized 3 to 6 Hz theta-delta slowing admixed with 15 to 18 Hz, beta activity distributed symmetrically and diffusely. Hyperventilation and photic stimulation were not performed.   ABNORMALITY - Continuous slow, generalized - Excessive beta, generalized IMPRESSION: This study is suggestive of moderate diffuse encephalopathy, nonspecific etiology. No seizures or epileptiform discharges were seen throughout the recording. Priyanka    Medications:    enoxaparin (LOVENOX) injection  30 mg Subcutaneous Q24H   feeding supplement  237 mL Oral BID BM   folic acid  1 mg Oral Daily   multivitamin with minerals  1 tablet Oral Daily   phenytoin (DILANTIN) IV  100 mg Intravenous Q8H   potassium chloride  40 mEq Oral BID   thiamine  100 mg Oral Daily   Or   thiamine  100 mg Intravenous Daily  Continuous Infusions:  cefTRIAXone (ROCEPHIN)  IV 2 g (11/09/20 0224)   levETIRAcetam 1,500 mg (11/09/20 1157)   vancomycin 500 mg (11/09/20 0443)     LOS: 2 days   Joseph Art  [facility]   How to contact the [facility] Attending or Consulting provider 7A - 7P or covering provider during after hours 7P -7A, for this patient?  Check the care team in [facility] and look for a) attending/consulting TRH provider listed and b) the [facility] team listed Log into [email or web address] and use [place]'s universal password to access. If you do not have the password, please contact the hospital operator. Locate the [facility] provider you are looking for under [facility] and page to a number that you can be directly reached. If you still have difficulty reaching the provider, please page the [facility] (Director on Call) for the Hospitalists listed on amion for assistance.  11/09/2020, 1:53 PM

## 2020-11-09 NOTE — Procedures (Addendum)
Patient Name: Alyssa Little  MRN: [id number]  Epilepsy Attending: Charlsie Quest  Referring Physician/Provider: Dr Caryl Pina Date: 11/09/2020 Duration: 21.41 mins  Patient history: 40yo F with epilepsy, presented with breakthrough seizure. EEG to evaluate for seizure.   Level of alertness: Awake  AEDs during EEG study: LEV  Technical aspects: This EEG study was done with scalp electrodes positioned according to the 10-20 International system of electrode placement. Electrical activity was acquired at a sampling rate of 500Hz  and reviewed with a high frequency filter of 70Hz  and a low frequency filter of 1Hz . EEG data were recorded continuously and digitally stored.   Description: No posterior dominant rhythm consists of 9 Hz activity of moderate voltage (25-35 uV) seen predominantly in posterior head regions, symmetric and reactive to eye opening and eye closing. EEG showed continuous generalized 3 to 6 Hz theta-delta slowing admixed with 15 to 18 Hz, beta activity distributed symmetrically and diffusely. Hyperventilation and photic stimulation were not performed.     ABNORMALITY - Continuous slow, generalized - Excessive beta, generalized  IMPRESSION: This study is suggestive of moderate diffuse encephalopathy, nonspecific etiology. No seizures or epileptiform discharges were seen throughout the recording.  Kirin Brandenburger 

## 2020-11-09 NOTE — Progress Notes (Signed)
EEG complete - results pending 

## 2020-11-09 NOTE — Plan of Care (Signed)

## 2020-11-10 ENCOUNTER — Other Ambulatory Visit (HOSPITAL_COMMUNITY): Payer: Self-pay

## 2020-11-10 LAB — COMPREHENSIVE METABOLIC PANEL
ALT: 17 U/L (ref 0–44)
AST: 36 U/L (ref 15–41)
Albumin: 2.7 g/dL — ABNORMAL LOW (ref 3.5–5.0)
Alkaline Phosphatase: 62 U/L (ref 38–126)
Anion gap: 7 (ref 5–15)
BUN: 2 mg/dL — ABNORMAL LOW (ref 6–20)
CO2: 22 mmol/L (ref 22–32)
Calcium: 8.3 mg/dL — ABNORMAL LOW (ref 8.9–10.3)
Chloride: 106 mmol/L (ref 98–111)
Creatinine, Ser: 0.49 mg/dL (ref 0.44–1.00)
GFR, Estimated: >60 mL/min (ref >60)
Glucose, Bld: 95 mg/dL (ref 70–99)
Potassium: 3.1 mmol/L — ABNORMAL LOW (ref 3.5–5.1)
Sodium: 135 mmol/L (ref 135–145)
Total Bilirubin: 1.2 mg/dL (ref 0.3–1.2)
Total Protein: 5.5 g/dL — ABNORMAL LOW (ref 6.5–8.1)

## 2020-11-10 LAB — CULTURE, BLOOD (ROUTINE X 2)
Special Requests: ADEQUATE
Special Requests: ADEQUATE

## 2020-11-10 LAB — CBC
HCT: 29 % — ABNORMAL LOW (ref 36.0–46.0)
Hemoglobin: 9.5 g/dL — ABNORMAL LOW (ref 12.0–15.0)
MCH: 35.8 pg — ABNORMAL HIGH (ref 26.0–34.0)
MCHC: 32.8 g/dL (ref 30.0–36.0)
MCV: 109.4 fL — ABNORMAL HIGH (ref 80.0–100.0)
Platelets: 159 10*3/uL (ref 150–400)
RBC: 2.65 MIL/uL — ABNORMAL LOW (ref 3.87–5.11)
RDW: 13.9 % (ref 11.5–15.5)
WBC: 5.8 10*3/uL (ref 4.0–10.5)
nRBC: 0 % (ref 0.0–0.2)

## 2020-11-10 LAB — MAGNESIUM: Magnesium: 1.8 mg/dL (ref 1.7–2.4)

## 2020-11-10 MED ORDER — LORAZEPAM 2 MG/ML IJ SOLN: 1.0000 mg | INTRAMUSCULAR | Status: DC | PRN

## 2020-11-10 MED ORDER — PHENYTOIN 50 MG PO CHEW
100.0000 mg | CHEWABLE_TABLET | Freq: Three times a day (TID) | ORAL | Status: DC
  Administered 2020-11-10 – 2020-11-11 (×3): 100 mg via ORAL
  Filled 2020-11-10 (×3): qty 2

## 2020-11-10 MED ORDER — THIAMINE HCL 100 MG PO TABS
100.0000 mg | ORAL_TABLET | Freq: Every day | ORAL | 0 refills | Status: DC
  Filled 2020-11-10: qty 30, 30d supply, fill #0

## 2020-11-10 MED ORDER — POTASSIUM CHLORIDE CRYS ER 20 MEQ PO TBCR
40.0000 meq | EXTENDED_RELEASE_TABLET | Freq: Once | ORAL | Status: AC
  Administered 2020-11-10: 40 meq via ORAL
  Filled 2020-11-10: qty 2

## 2020-11-10 MED ORDER — PHENYTOIN 50 MG PO CHEW
100.0000 mg | CHEWABLE_TABLET | Freq: Three times a day (TID) | ORAL | 1 refills | Status: DC
  Filled 2020-11-10: qty 180, 30d supply, fill #0
  Filled 2020-12-27: qty 180, 30d supply, fill #1
  Filled 2021-02-07: qty 180, 30d supply, fill #0

## 2020-11-10 MED ORDER — LEVETIRACETAM 750 MG PO TABS
1500.0000 mg | ORAL_TABLET | Freq: Two times a day (BID) | ORAL | 1 refills | Status: DC
  Filled 2020-11-10: qty 120, 30d supply, fill #0
  Filled 2020-12-27 (×2): qty 120, 30d supply, fill #1

## 2020-11-10 MED ORDER — FOLIC ACID 1 MG PO TABS: 1.0000 mg | ORAL_TABLET | Freq: Every day | ORAL | Status: DC

## 2020-11-10 MED ORDER — LORAZEPAM 1 MG PO TABS
1.0000 mg | ORAL_TABLET | ORAL | Status: DC | PRN
  Administered 2020-11-10: 2 mg via ORAL
  Filled 2020-11-10: qty 2

## 2020-11-10 MED ORDER — LEVETIRACETAM 750 MG PO TABS
1500.0000 mg | ORAL_TABLET | Freq: Two times a day (BID) | ORAL | Status: DC
  Administered 2020-11-10 – 2020-11-11 (×2): 1500 mg via ORAL
  Filled 2020-11-10 (×2): qty 2

## 2020-11-10 MED ORDER — MAGNESIUM SULFATE 2 GM/50ML IV SOLN
2.0000 g | Freq: Once | INTRAVENOUS | Status: AC
  Administered 2020-11-10: 2 g via INTRAVENOUS
  Filled 2020-11-10: qty 50

## 2020-11-10 NOTE — Evaluation (Signed)
Physical Therapy Evaluation Patient Details Name: Alyssa Little MRN: [id number] DOB: [birth year] Today's Date: 11/10/2020   History of Present Illness  Pt is a 40 y/o female admitted secondary to seizures and AMS. Likely from not taking seizure medications. PMH includes alcohol abuse, TBI, and seizures.  Clinical Impression  Patient evaluated by Physical Therapy with no further acute PT needs identified. All education has been completed and the patient has no further questions. Pt overall steady and at a supervision level with mobility tasks. No overt LOB noted with horizontal and vertical head turns. Decreased safety awareness noted, but anticipate this may be baseline given pt has had TBI. Pt reports boyfriend can assist at d/c. See below for any follow-up Physical Therapy or equipment needs. PT is signing off. Thank you for this referral. If needs change, please re-consult.      Follow Up Recommendations No PT follow up;Supervision - Intermittent    Equipment Recommendations  None recommended by PT    Recommendations for Other Services       Precautions / Restrictions Precautions Precautions: Other (comment) Precaution Comments: safety sitter precautions. Restrictions Weight Bearing Restrictions: No      Mobility  Bed Mobility               General bed mobility comments: Sitting in chair upon entry    Transfers Overall transfer level: Needs assistance Equipment used: None Transfers: Sit to/from Stand Sit to Stand: Supervision         General transfer comment: Supervision for safety  Ambulation/Gait Ambulation/Gait assistance: Supervision Gait Distance (Feet): 200 Feet Assistive device: IV Pole Gait Pattern/deviations: Step-through pattern;Decreased stride length Gait velocity: WFL   General Gait Details: Overall steady with horizontal and vertical head turns. No LOB noted. Required some cues for safety awareness when steering IV  pole.  Stairs Stairs: Yes Stairs assistance: Supervision Stair Management: One rail Right;Forwards;Step to pattern Number of Stairs: 3 General stair comments: Overall steady stair navigation. No LOB noted. Supervision for safety.  Wheelchair Mobility    Modified Rankin (Stroke Patients Only)       Balance Overall balance assessment: Mild deficits observed, not formally tested                                           Pertinent Vitals/Pain Pain Assessment: No/denies pain    Home Living Family/patient expects to be discharged to:: Private residence Living Arrangements: Spouse/significant other Available Help at Discharge: Family;Available PRN/intermittently Type of Home: Apartment Home Access: Stairs to enter Entrance Stairs-Rails: Right Entrance Stairs-Number of Steps: 1 Home Layout: One level Home Equipment: None      Prior Function Level of Independence: Independent               Hand Dominance        Extremity/Trunk Assessment   Upper Extremity Assessment Upper Extremity Assessment: Overall WFL for tasks assessed    Lower Extremity Assessment Lower Extremity Assessment: Overall WFL for tasks assessed    Cervical / Trunk Assessment Cervical / Trunk Assessment: Normal  Communication   Communication: No difficulties  Cognition Arousal/Alertness: Awake/alert Behavior During Therapy: WFL for tasks assessed/performed Overall Cognitive Status: No family/caregiver present to determine baseline cognitive functioning  General Comments: TBI at baseline. Mildly decreased safety awareness noted.      General Comments      Exercises     Assessment/Plan    PT Assessment Patent does not need any further PT services  PT Problem List         PT Treatment Interventions      PT Goals (Current goals can be found in the Care Plan section)  Acute Rehab PT Goals Patient Stated Goal: to go  home PT Goal Formulation: With patient Time For Goal Achievement: 11/10/20 Potential to Achieve Goals: Good    Frequency     Barriers to discharge        Co-evaluation               AM-PAC PT "6 Clicks" Mobility  Outcome Measure Help needed turning from your back to your side while in a flat bed without using bedrails?: None Help needed moving from lying on your back to sitting on the side of a flat bed without using bedrails?: None Help needed moving to and from a bed to a chair (including a wheelchair)?: None Help needed standing up from a chair using your arms (e.g., wheelchair or bedside chair)?: None Help needed to walk in hospital room?: None Help needed climbing 3-5 steps with a railing? : None 6 Click Score: 24    End of Session   Activity Tolerance: Patient tolerated treatment well Patient left: in bed;with call bell/phone within reach;with nursing/sitter in room (sitting EOB with safety sitter present) Nurse Communication: Mobility status PT Visit Diagnosis: Other symptoms and signs involving the nervous system (D97.416)    Time: 3845-3646 PT Time Calculation (min) (ACUTE ONLY): 10 min   Charges:   PT Evaluation $PT Eval Low Complexity: 1 Low          Cindee Salt, DPT  Acute Rehabilitation Services  Pager: [phone] Office: [phone]   Lehman Prom 11/10/2020, 11:22 AM

## 2020-11-10 NOTE — TOC CAGE-AID Note (Signed)
Transition of Care [facility]) - CAGE-AID Screening   Patient Details  Name: Alyssa Little MRN: [id number] Date of Birth: 1980-08-09  [facility]) CM/SW Contact:    Kermit Balo, RN Phone Number: 11/10/2020, 11:29 AM   Clinical Narrative: Patient admits to alcohol use and states she has cut back. She asked for virtual or online counseling information as her transportation is limited. CM provided this to her and inpatient/ outpatient counseling resources.   CAGE-AID Screening:    Have You Ever Felt You Ought to Cut Down on Your Drinking or Drug Use?: Yes Have People Annoyed You By Critizing Your Drinking Or Drug Use?: Yes Have You Felt Bad Or Guilty About Your Drinking Or Drug Use?: Yes Have You Ever Had a Drink or Used Drugs First Thing In The Morning to Steady Your Nerves or to Get Rid of a Hangover?: No CAGE-AID Score: 3  Substance Abuse Education Offered: Yes  Substance abuse interventions: Patient Counseling
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
## 2020-11-10 NOTE — Progress Notes (Addendum)
HOSPITAL MEDICINE OVERNIGHT EVENT NOTE    Discharge canceled earlier today due to persisting confusion.   Patient regularly attempting to get up out of bed to "use the bathroom" nearly 10 times an hour.  Patient placing herself at risk, particularly of fall and elopement.  Nursing has attempted frequent toileting and redirection with no improvement in behavior.  Placing order for one-to-one sitter.  Continue to monitor closely.  Of note, nursing also reports that patient is slightly tachycardic and hypertensive.  Upon chart review patient does have a history of alcohol abuse.  If patient begins to develop tremors, agitation, hallucinations or worsening tachycardia/hypertension we will consider initiation of CIWA protocol.  Marinda Elk  MD [facility]   ADDENDUM (7/28 10pm)  Nursing reports worsening agitation tachycardia and hypertension.  Will place patient on CIWA protocol, reassess after administration of as needed benzodiazepine.  Deno Lunger Anani Gu

## 2020-11-10 NOTE — Discharge Instructions (Signed)
Per [place] DMV statutes, patients with seizures are not allowed to drive until they have been seizure-free for six months.    Use caution when using heavy equipment or power tools. Avoid working on ladders or at heights. Take showers instead of baths. Ensure the water temperature is not too high on the home water heater. Do not go swimming alone. Do not lock yourself in a room alone (i.e. bathroom). When caring for infants or small children, sit down when holding, feeding, or changing them to minimize risk of injury to the child in the event you have a seizure. Maintain good sleep hygiene. Avoid alcohol.    If patient has another seizure, call 911 and bring them back to the ED if: A.  The seizure lasts longer than 5 minutes.      B.  The patient doesn't wake shortly after the seizure or has new problems such as difficulty seeing, speaking or moving following the seizure C.  The patient was injured during the seizure D.  The patient has a temperature over 102 F (39C) E.  The patient vomited during the seizure and now is having trouble breathing  

## 2020-11-10 NOTE — TOC Transition Note (Addendum)
Transition of Care [facility]) - CM/SW Discharge Note   Patient Details  Name: Alyssa Little MRN: [id number] Date of Birth: 01/14/1981  Transition of Care [facility]) CM/SW Contact:  Kermit Balo, RN Phone Number: 11/10/2020, 11:34 AM   Clinical Narrative:    Patient discharging back home with significant other today. She will have intermittent supervision at home.  Pt without a PCP. Pt agreeable to being set up with one of the [facility]. Appt on AVS.  Pt states she will have issues with transportation. [facility] sent pts information to assist with transport to appts. Information for the patient also on AVS. Pts significant other to provide transport home today.  1600: MATH provided for assistance with meds. Meds sent to [facility] pharmacy and will be delivered to the room.   Final next level of care: Home/Self Care Barriers to Discharge: Inadequate or no insurance, Barriers Unresolved (comment)   Patient Goals and CMS Choice        Discharge Placement                       Discharge Plan and Services                                     Social Determinants of Health (SDOH) Interventions     Readmission Risk Interventions No flowsheet data found.

## 2020-11-10 NOTE — Progress Notes (Signed)
Progress Note    Alyssa Little  RRN:[id number] DOB: July 10, 1980  DOA: 11/07/2020 PCP: Pcp, No    Brief Narrative:     Medical records reviewed and are as summarized below:  Alyssa Little is an 40 y.o. female with medical history significant of TBI, prior SAH, seizure disorder noncompliant with meds, EtOH abuse. Boy friend reports: 1) she doesn't take her seizure meds 2) does drink at times, not clear how much 3) Rash on face is rosacia, is chronic and not new.    Assessment/Plan:   Principal Problem:   Post-ictal state ([place]) Active Problems:   Seizures (H[facility])   ETOH abuse   TBI (traumatic brain injury) ([place])   Altered mental status   SIRS (systemic inflammatory response syndrome) (HCC)   Alcoholic ketoacidosis      Altered mental status - -Likely post-ictal state- seems improved -will get SLP speech eval  Seizures - -Likely due to: Not taking home seizure meds (she is "anti med" and never takes them according to boyfriend). EtOH withdrawal also possibly playing a role Fever may be lowering seizure threshold as well. -keppra/dilantin -eeg: diffuse encephalopathy  fever - -Unclear source of infection: Based on clinical examination the likelihood of meningitis seems to be low.  Cannot do LP due to episodes of agitation which would make it very risky to do such a procedure. -UA neg -COVID and flu neg -CT chest, abd, pelvis, and head neg -H/o fever with seizures in Dec 2020: had MSSA bacteremia on 1/2 BCx that admit. -blood cultures with ? Contaminant   EtOH abuse - -no signs of withdrawal  Hypokalemia, hypomag - -replete  Hypo phos -Replete  compression fx of T7-T10 - -Unclear if acute vs chronic -Mild compression fractures    Family Communication/Anticipated D/C date and plan/Code Status   DVT prophylaxis: Lovenox ordered. Code Status: Full Code.  Family Communication: boyfriend at bedside this AM-- I attempted to call him  this afternoon but no number in the chart Disposition Plan: Status is: Inpatient  Remains inpatient appropriate because:Inpatient level of care appropriate due to severity of illness  Dispo:  Patient From: Home  Planned Disposition: Home  Medically stable for discharge: No           Medical Consultants:   Neuro     Subjective:  This AM seemed appropriate but by this afternoon was found to be confused by nursing  Objective:    Vitals:   11/10/20 0923 11/10/20 1059 11/10/20 1100 11/10/20 1514  BP:  120/88  (!) 132/97  Pulse: 99 (!) 113 98 97  Resp:    16  Temp:    98.7 F (37.1 C)  TempSrc:    Oral  SpO2:  95%  99%  Weight:      Height:        Intake/Output Summary (Last 24 hours) at 11/10/2020 1640 Last data filed at 11/10/2020 1329 Gross per 24 hour  Intake 760 ml  Output --  Net 760 ml   Filed Weights   11/07/20 2235  Weight: 40.8 kg    Exam:   General: Appearance:    Thin female older than stated age     Lungs:     respirations unlabored  Heart:    Normal heart rate.    MS:   All extremities are intact.    Neurologic:   Awake, able to answer questions appropriately for the most part (did say she was in college in 1991 where she  met her boyfriend)         Data Reviewed:   I have personally reviewed following labs and imaging studies:  Labs: Labs show the following:   Basic Metabolic Panel: Recent Labs  Lab 11/07/20 2220 11/08/20 0613 11/08/20 1506 11/09/20 0319 11/10/20 0317  NA 137 135  --  135 135  K 3.2* 2.7*   < > 3.0* 3.1*  CL 95* 102  --  103 106  CO2 23 22  --  17* 22  GLUCOSE 114* 115*  --  114* 95  BUN 7 <5*  --  <5* 2*  CREATININE 0.48 0.34*  --  0.58 0.49  CALCIUM 8.9 7.4*  --  7.7* 8.3*  MG 1.6*  --   --  1.5* 1.8  PHOS 2.4* 1.3*  --  1.7*  --    < > = values in this interval not displayed.   GFR Estimated Creatinine Clearance: 60.2 mL/min (by C-G formula based on SCr of 0.49 mg/dL). Liver Function  Tests: Recent Labs  Lab 11/07/20 2220 11/08/20 0613 11/09/20 0319 11/10/20 0317  AST 45* 41 39 36  ALT '25 22 18 17  ' ALKPHOS 94 82 76 62  BILITOT 1.2 1.1 1.1 1.2  PROT 7.9 6.8 6.2* 5.5*  ALBUMIN 4.1 3.5 3.0* 2.7*   No results for input(s): LIPASE, AMYLASE in the last 168 hours. No results for input(s): AMMONIA in the last 168 hours. Coagulation profile Recent Labs  Lab 11/07/20 2220  INR 0.9    CBC: Recent Labs  Lab 11/07/20 2220 11/08/20 0613 11/09/20 0319 11/10/20 0317  WBC 10.9* 12.4* 8.9 5.8  NEUTROABS 9.6*  --   --   --   HGB 13.2 12.4 11.6* 9.5*  HCT 38.4 37.0 35.2* 29.0*  MCV 105.8* 108.2* 108.6* 109.4*  PLT 185 156 173 159   Cardiac Enzymes: Recent Labs  Lab 11/07/20 2220 11/09/20 0319  CKTOTAL 550* 363*   BNP (last 3 results) No results for input(s): PROBNP in the last 8760 hours. CBG: Recent Labs  Lab 11/07/20 2156  GLUCAP 122*   D-Dimer: No results for input(s): DDIMER in the last 72 hours. Hgb A1c: No results for input(s): HGBA1C in the last 72 hours. Lipid Profile: No results for input(s): CHOL, HDL, LDLCALC, TRIG, CHOLHDL, LDLDIRECT in the last 72 hours. Thyroid function studies: No results for input(s): TSH, T4TOTAL, T3FREE, THYROIDAB in the last 72 hours.  Invalid input(s): FREET3 Anemia work up: Recent Labs    11/08/20 1927  VITAMINB12 320   Sepsis Labs: Recent Labs  Lab 11/07/20 2142 11/07/20 2220 11/08/20 0613 11/09/20 0319 11/10/20 0317  PROCALCITON  --   --  <0.10  --   --   WBC  --  10.9* 12.4* 8.9 5.8  LATICACIDVEN 1.8  --   --   --   --     Microbiology Recent Results (from the past 240 hour(s))  Resp Panel by RT-PCR (Flu A&B, Covid) Nasopharyngeal Swab     Status: None   Collection Time: 11/07/20  9:42 PM   Specimen: Nasopharyngeal Swab; Nasopharyngeal(NP) swabs in vial transport medium  Result Value Ref Range Status   SARS Coronavirus 2 by RT PCR NEGATIVE NEGATIVE Final    Comment: (NOTE) SARS-CoV-2  target nucleic acids are NOT DETECTED.  The SARS-CoV-2 RNA is generally detectable in upper respiratory specimens during the acute phase of infection. The lowest concentration of SARS-CoV-2 viral copies this assay can detect is 138 copies/mL. A negative result  does not preclude SARS-Cov-2 infection and should not be used as the sole basis for treatment or other patient management decisions. A negative result may occur with  improper specimen collection/handling, submission of specimen other than nasopharyngeal swab, presence of viral mutation(s) within the areas targeted by this assay, and inadequate number of viral copies(<138 copies/mL). A negative result must be combined with clinical observations, patient history, and epidemiological information. The expected result is Negative.  Fact Sheet for Patients:  EntrepreneurPulse.com.au  Fact Sheet for Healthcare Providers:  IncredibleEmployment.be  This test is no t yet approved or cleared by the [place] FDA and  has been authorized for detection and/or diagnosis of SARS-CoV-2 by FDA under an Emergency Use Authorization (EUA). This EUA will remain  in effect (meaning this test can be used) for the duration of the COVID-19 declaration under Section 564(b)(1) of the Act, 21 U.S.C.section 360bbb-3(b)(1), unless the authorization is terminated  or revoked sooner.       Influenza A by PCR NEGATIVE NEGATIVE Final   Influenza B by PCR NEGATIVE NEGATIVE Final    Comment: (NOTE) The Xpert Xpress SARS-CoV-2/FLU/RSV plus assay is intended as an aid in the diagnosis of influenza from Nasopharyngeal swab specimens and should not be used as a sole basis for treatment. Nasal washings and aspirates are unacceptable for Xpert Xpress SARS-CoV-2/FLU/RSV testing.  Fact Sheet for Patients: EntrepreneurPulse.com.au  Fact Sheet for Healthcare  Providers: IncredibleEmployment.be  This test is not yet approved or cleared by the [place] FDA and has been authorized for detection and/or diagnosis of SARS-CoV-2 by FDA under an Emergency Use Authorization (EUA). This EUA will remain in effect (meaning this test can be used) for the duration of the COVID-19 declaration under Section 564(b)(1) of the Act, 21 U.S.C. section 360bbb-3(b)(1), unless the authorization is terminated or revoked.  Performed at [facility], [place] [address], [place], [place] [address]   Blood Culture (routine x 2)     Status: Abnormal   Collection Time: 11/07/20 10:20 PM   Specimen: BLOOD LEFT FOREARM  Result Value Ref Range Status   Specimen Description   Final    BLOOD LEFT FOREARM Performed at [facility], [place] [address] [place], [place] [address]    Special Requests   Final    BOTTLES DRAWN AEROBIC AND ANAEROBIC Blood Culture adequate volume Performed at [employer] [address] [place], [place] [address]    Culture  Setup Time (A)  Final    GRAM VARIABLE ROD AEROBIC BOTTLE ONLY CRITICAL VALUE NOTED.  VALUE IS CONSISTENT WITH PREVIOUSLY REPORTED AND CALLED VALUE.    Culture (A)  Final    BACILLUS SPECIES Standardized susceptibility testing for this organism is not available. Performed at [facility], [place] [address] [place], [place] [address]    Report Status 11/10/2020 FINAL  Final  Blood Culture (routine x 2)     Status: Abnormal   Collection Time: 11/07/20 10:20 PM   Specimen: BLOOD LEFT FOREARM  Result Value Ref Range Status   Specimen Description   Final    BLOOD LEFT FOREARM Performed at [facility], [place] [address], [place], [place] [address]    Special Requests   Final    BOTTLES DRAWN AEROBIC AND ANAEROBIC Blood Culture adequate volume Performed at [place] [address], [place], [place] [address]    Culture  Setup  Time   Final    ANAEROBIC BOTTLE ONLY  GRAM VARIABLE ROD Organism ID to follow CRITICAL RESULT CALLED TO, READ BACK BY AND VERIFIED WITH: J LEGGE PHARMD 1708 11/08/20 A BROWNING    Culture (A)  Final    BACILLUS SPECIES Standardized susceptibility testing for this organism is not available. STAPHYLOCOCCUS HOMINIS THE SIGNIFICANCE OF ISOLATING THIS ORGANISM FROM A SINGLE SET OF BLOOD CULTURES WHEN MULTIPLE SETS ARE DRAWN IS UNCERTAIN. PLEASE NOTIFY THE MICROBIOLOGY DEPARTMENT WITHIN ONE WEEK IF SPECIATION AND SENSITIVITIES ARE REQUIRED. CRITICAL RESULT CALLED TO, READ BACK BY AND VERIFIED WITH: Clydene Fake [id number]  [id number] FCP Performed at [place] [address] [place], [place] [address]    Report Status 11/10/2020 FINAL  Final  Blood Culture ID Panel (Reflexed)     Status: Abnormal   Collection Time: 11/07/20 10:20 PM  Result Value Ref Range Status   Enterococcus faecalis NOT DETECTED NOT DETECTED Final   Enterococcus Faecium NOT DETECTED NOT DETECTED Final   Listeria monocytogenes NOT DETECTED NOT DETECTED Final   Staphylococcus species DETECTED (A) NOT DETECTED Final    Comment: CRITICAL RESULT CALLED TO, READ BACK BY AND VERIFIED WITH: J LEGGE PHARMD 1708 11/08/20 A BROWNING    Staphylococcus aureus (BCID) NOT DETECTED NOT DETECTED Final   Staphylococcus epidermidis NOT DETECTED NOT DETECTED Final   Staphylococcus lugdunensis NOT DETECTED NOT DETECTED Final   Streptococcus species NOT DETECTED NOT DETECTED Final   Streptococcus agalactiae NOT DETECTED NOT DETECTED Final   Streptococcus pneumoniae NOT DETECTED NOT DETECTED Final   Streptococcus pyogenes NOT DETECTED NOT DETECTED Final   A.calcoaceticus-baumannii NOT DETECTED NOT DETECTED Final   Bacteroides fragilis NOT DETECTED NOT DETECTED Final   Enterobacterales NOT DETECTED NOT DETECTED Final   Enterobacter cloacae complex NOT DETECTED NOT DETECTED Final   Escherichia coli NOT DETECTED NOT DETECTED Final    Klebsiella aerogenes NOT DETECTED NOT DETECTED Final   Klebsiella oxytoca NOT DETECTED NOT DETECTED Final   Klebsiella pneumoniae NOT DETECTED NOT DETECTED Final   Proteus species NOT DETECTED NOT DETECTED Final   Salmonella species NOT DETECTED NOT DETECTED Final   Serratia marcescens NOT DETECTED NOT DETECTED Final   Haemophilus influenzae NOT DETECTED NOT DETECTED Final   Neisseria meningitidis NOT DETECTED NOT DETECTED Final   Pseudomonas aeruginosa NOT DETECTED NOT DETECTED Final   Stenotrophomonas maltophilia NOT DETECTED NOT DETECTED Final   Candida albicans NOT DETECTED NOT DETECTED Final   Candida auris NOT DETECTED NOT DETECTED Final   Candida glabrata NOT DETECTED NOT DETECTED Final   Candida krusei NOT DETECTED NOT DETECTED Final   Candida parapsilosis NOT DETECTED NOT DETECTED Final   Candida tropicalis NOT DETECTED NOT DETECTED Final   Cryptococcus neoformans/gattii NOT DETECTED NOT DETECTED Final    Comment: Performed at [facility], [place]. [address], [place],  [address]    Procedures and diagnostic studies:  EEG adult  Result Date: 11/09/2020 Lora Havens, MD     11/09/2020  1:08 PM Patient Name: Jmya Uliano MRN: [id number] Epilepsy Attending: Lora Havens Referring Physician/Provider: Dr Kerney Elbe Date: 11/09/2020 Duration: 21.41 mins Patient history: 40yo F with epilepsy, presented with breakthrough seizure. EEG to evaluate for seizure. Level of alertness: Awake AEDs during EEG study: LEV Technical aspects: This EEG study was done with scalp electrodes positioned according to the 10-20 International system of electrode placement. Electrical activity was acquired at a sampling rate of '500Hz'  and reviewed with a high frequency filter of '70Hz'  and a low frequency filter of '1Hz' . EEG  data were recorded continuously and digitally stored. Description: No posterior dominant rhythm consists of 9 Hz activity of moderate voltage (25-35 uV) seen  predominantly in posterior head regions, symmetric and reactive to eye opening and eye closing. EEG showed continuous generalized 3 to 6 Hz theta-delta slowing admixed with 15 to 18 Hz, beta activity distributed symmetrically and diffusely. Hyperventilation and photic stimulation were not performed.   ABNORMALITY - Continuous slow, generalized - Excessive beta, generalized IMPRESSION: This study is suggestive of moderate diffuse encephalopathy, nonspecific etiology. No seizures or epileptiform discharges were seen throughout the recording. Priyanka Barbra Sarks    Medications:    enoxaparin (LOVENOX) injection  30 mg Subcutaneous Q24H   feeding supplement  237 mL Oral BID BM   folic acid  1 mg Oral Daily   levETIRAcetam  1,500 mg Oral BID   multivitamin with minerals  1 tablet Oral Daily   phenytoin  100 mg Oral TID   potassium chloride  40 mEq Oral Once   thiamine  100 mg Oral Daily   Or   thiamine  100 mg Intravenous Daily   Continuous Infusions:     LOS: 3 days   Geradine Girt  [facility]   How to contact the [facility] Attending or Consulting provider [place] or covering provider during after hours [place], for this patient?  Check the care team in [facility] and look for a) attending/consulting [facility] provider listed and b) the [facility] team listed Log into [email or web address] and use [place]'s universal password to access. If you do not have the password, please contact the hospital operator. Locate the [facility] provider you are looking for under [facility] and page to a number that you can be directly reached. If you still have difficulty reaching the provider, please page the [facility] (Director on Call) for the Hospitalists listed on amion for assistance.  11/10/2020, 4:40 PM

## 2020-11-11 LAB — VITAMIN D 25 HYDROXY (VIT D DEFICIENCY, FRACTURES): Vit D, 25-Hydroxy: 18.54 ng/mL — ABNORMAL LOW (ref 30–100)

## 2020-11-11 LAB — BASIC METABOLIC PANEL
Anion gap: 7 (ref 5–15)
BUN: 6 mg/dL (ref 6–20)
CO2: 28 mmol/L (ref 22–32)
Calcium: 9.3 mg/dL (ref 8.9–10.3)
Chloride: 103 mmol/L (ref 98–111)
Creatinine, Ser: 0.51 mg/dL (ref 0.44–1.00)
GFR, Estimated: >60 mL/min (ref >60)
Glucose, Bld: 101 mg/dL — ABNORMAL HIGH (ref 70–99)
Potassium: 3.8 mmol/L (ref 3.5–5.1)
Sodium: 138 mmol/L (ref 135–145)

## 2020-11-11 LAB — AMMONIA: Ammonia: 21 umol/L (ref 9–35)

## 2020-11-11 LAB — VITAMIN B1: Vitamin B1 (Thiamine): 179.1 nmol/L (ref 66.5–200.0)

## 2020-11-11 NOTE — Progress Notes (Signed)
Patient is having forgetfulness, has gotten up to bathroom 15 times in the last hour. Does not remember going to the 2 minutes earlier when asked the time she went. Gait is steady, but she is very restless continues ask for her boyfriend. Says he is outside the room because she heard him coughing. Hs is not here. Paged MD, for sitter, but staff is not available. PRN PO ativan given for withdrawal symptoms. Will continue to monitor.

## 2020-11-11 NOTE — Discharge Summary (Signed)
Physician Discharge Summary  Alyssa Little MVH:[id number] DOB: 1981-02-06 DOA: 11/07/2020  PCP: Oneita Hurt, No  Admit date: 11/07/2020 Discharge date: 11/11/2020  Admitted From: home Discharge disposition:home with supervision   Recommendations for Outpatient Follow-Up:   Referral to outpt counseling done by CM Referral to out patient neurology Seizure precautions Family to supervise medications Vitamin d pending- replete if low   Discharge Diagnosis:   Principal Problem:   Post-ictal state (HCC) Active Problems:   Seizures (HCC)   ETOH abuse   TBI (traumatic brain injury) (HCC)   Altered mental status   SIRS (systemic inflammatory response syndrome) (HCC)   Alcoholic ketoacidosis    Discharge Condition: stable  Diet recommendation:  Regular.  Wound care: None.  Code status: Full.   History of Present Illness:   Alyssa Little is a 40 y.o. female with medical history significant of TBI, prior SAH, seizure disorder noncompliant with meds, EtOH abuse.   LKW ~1030am.  Had 4-5 min seizure witnessed by boyfriend.  AFter this she came around, he left her in bed to rest.  Came back around 8pm and found her down on ground by kitchen - no witnessed activity in afternoon.  Pt brought in by BF to [facility].   Boy friend reports: 1) she doesn't take her seizure meds 2) does drink at times, not clear how much 3) Rash on face is rosacia, is chronic and not new.   Review of chart: H/o admit for seizure and AMS in Dec 2020, also had fever that admit, found to have MSSA bacteremia on 1/2 BCx that admit treated with Rocephin.  2d echo neg for endocarditis   Hospital Course by Problem:   Altered mental status  with TBI (boyfriend says at baseline she is impulsive, confused and has a few hours in the AM when she does better but then tends to worsen as day goes on -Likely post-ictal state- seems improved to her baseline   Seizures - -Likely due to: Not taking  home seizure meds (she is "anti med" and never takes them according to boyfriend). EtOH withdrawal also possibly playing a role Fever may be lowering seizure threshold as well. -keppra/dilantin -eeg: diffuse encephalopathy -will need outpatient neurology follow up   fever - -Unclear source of infection: Based on clinical examination the likelihood of meningitis seems to be low.  Cannot do LP due to episodes of agitation which would make it very risky to do such a procedure. -UA neg -COVID and flu neg -CT chest, abd, pelvis, and head neg -H/o fever with seizures in Dec 2020: had MSSA bacteremia on 1/2 BCx that admit. -blood cultures with ? Contaminant -- resolved off abx   EtOH abuse - -no signs of withdrawal   Hypokalemia, hypomag - -repleted -encouraged patient to eat   Hypo phos -Repleted   compression fx of T7-T10 - -Unclear if acute vs chronic -Mild compression fractures    Medical Consultants:    neurology  Discharge Exam:   Vitals:   11/11/20 0350 11/11/20 0835  BP: (!) 139/102 (!) 144/103  Pulse: (!) 105 90  Resp: 18 20  Temp: 98.2 F (36.8 C) 98.4 F (36.9 C)  SpO2: 97% 97%   Vitals:   11/10/20 2222 11/11/20 0005 11/11/20 0350 11/11/20 0835  BP: (!) 155/112 (!) 163/111 (!) 139/102 (!) 144/103  Pulse: (!) 112 (!) 113 (!) 105 90  Resp: Temp: 98.6 F (37 C) 98.8 F (37.1 C)  98.2 F (36.8 C) 98.4 F (36.9 C)  TempSrc:  Oral Oral Oral  SpO2: 98% 98% 97% 97%  Weight:      Height:        General exam: Appears calm and comfortable.    The results of significant diagnostics from this hospitalization (including imaging, microbiology, ancillary and laboratory) are listed below for reference.     Procedures and Diagnostic Studies:   CT Head Wo Contrast  Result Date: 11/07/2020 CLINICAL DATA:  Witnessed seizure, persisting confusion EXAM: CT HEAD WITHOUT CONTRAST CT CERVICAL SPINE WITHOUT CONTRAST TECHNIQUE: Multidetector CT imaging of  the head and cervical spine was performed following the standard protocol without intravenous contrast. Multiplanar CT image reconstructions of the cervical spine were also generated. COMPARISON:  CT head 03/05/2018, CT head and cervical spine 03/26/2019 FINDINGS: CT HEAD FINDINGS Brain: Remote encephalomalacia in the right anterior frontal lobe/superior frontal gyrus compatible with site of prior hemorrhage. No evidence of acute infarction, hemorrhage, hydrocephalus, extra-axial collection, visible mass lesion or mass effect. Slightly age advanced generalized parenchymal atrophy. Basal cisterns are patent. Midline intracranial structures are unremarkable. Vascular: No hyperdense vessel or unexpected calcification. Skull: Multiple areas of scalp thickening noted in the left frontal, bilateral parietal and right occipital scalp. No subjacent calvarial fracture or acute osseous injury is seen. Sinuses/Orbits: Paranasal sinuses and mastoid air cells are predominantly clear. Included orbital structures are unremarkable. Other: None CT CERVICAL SPINE FINDINGS Alignment: Straightening and slight reversal the normal cervical lordosis. Possibly related to cervical flexion noted on scout view. No evidence of traumatic listhesis. No abnormally widened, perched or jumped facets. Normal alignment of the craniocervical and atlantoaxial articulations. Skull base and vertebrae: Motion artifact may limit detection of subtle injury and mimic cortical step-off, particularly involving the C2-C6 levels. No acute skull base fracture. No vertebral body fracture or height loss. Normal bone mineralization. No worrisome osseous lesions. Soft tissues and spinal canal: No pre or paravertebral fluid or swelling. No visible canal hematoma. Airways patent. No worrisome cervical adenopathy. Disc levels: No significant central canal or foraminal stenosis identified within the imaged levels of the spine. Upper chest: No acute abnormality in the  upper chest or imaged lung apices. Other: Normal thyroid. IMPRESSION: Multiple sites of scalp swelling and thickening, correlate for contusive changes. No calvarial fracture. No acute intracranial abnormality. Focus of encephalomalacia in the right anterior frontal lobe/superior frontal gyrus compatible with site of remote hemorrhage. Slightly age advanced parenchymal volume loss. No acute cervical spine fracture or traumatic listhesis. Evaluation limited by motion artifact, most pronounced C3-C6. Electronically Signed   By: Kreg Shropshire M.D.   On: 11/07/2020 21:44   CT Chest W Contrast  Result Date: 11/07/2020 CLINICAL DATA:  40 year old female with trauma. EXAM: CT CHEST, ABDOMEN, AND PELVIS WITH CONTRAST TECHNIQUE: Multidetector CT imaging of the chest, abdomen and pelvis was performed following the standard protocol during bolus administration of intravenous contrast. CONTRAST:  80mL OMNIPAQUE IOHEXOL 350 MG/ML SOLN COMPARISON:  Chest and pelvic radiograph dated 11/07/2020. FINDINGS: CT CHEST FINDINGS Cardiovascular: Top-normal cardiac size. No pericardial effusion. The thoracic aorta is unremarkable. The origins of the great vessels of the aortic arch appear patent. The central pulmonary arteries appear unremarkable. Mediastinum/Nodes: No hilar or mediastinal adenopathy. The esophagus and the thyroid gland are grossly unremarkable. No mediastinal fluid collection. Lungs/Pleura: The lungs are clear. There is no pleural effusion pneumothorax. The central airways are patent. Musculoskeletal: Multiple subacute or chronic left rib fractures as well as several old healed right rib  fractures. There are however compression fractures of superior endplates of T7 and T10 with less than 20% loss of vertebral body height and. These are age indeterminate but acute fractures are not excluded correlation with clinical exam and point tenderness recommended. MRI may provide better evaluation if clinically indicated. No  retropulsion. CT ABDOMEN PELVIS FINDINGS No intra-abdominal free air or free fluid. Hepatobiliary: No focal liver abnormality is seen. No gallstones, gallbladder wall thickening, or biliary dilatation. Pancreas: Unremarkable. No pancreatic ductal dilatation or surrounding inflammatory changes. Spleen: Normal in size without focal abnormality. Adrenals/Urinary Tract: The adrenal glands unremarkable. The kidneys, visualized ureters, and urinary bladder appear unremarkable. Stomach/Bowel: There is no bowel obstruction or active inflammation. The appendix is normal. Vascular/Lymphatic: Mild noncalcified plaque of the abdominal aorta. The IVC is unremarkable. No portal venous gas. There is no adenopathy. Reproductive: The uterus and ovaries are grossly unremarkable. Other: None Musculoskeletal: No acute osseous pathology. IMPRESSION: 1. Age indeterminate, possibly acute or subacute, mild compression fractures of the superior endplates of T7 and T10. Correlation with point tenderness recommended. 2. Multiple subacute or old left rib fractures as well as several old healed right rib fractures. 3. No acute/traumatic intra-abdominal or pelvic pathology. 4. Aortic Atherosclerosis (ICD10-I70.0). Electronically Signed   By: Elgie Collard M.D.   On: 11/07/2020 23:58   CT Cervical Spine Wo Contrast  Result Date: 11/07/2020 CLINICAL DATA:  Witnessed seizure, persisting confusion EXAM: CT HEAD WITHOUT CONTRAST CT CERVICAL SPINE WITHOUT CONTRAST TECHNIQUE: Multidetector CT imaging of the head and cervical spine was performed following the standard protocol without intravenous contrast. Multiplanar CT image reconstructions of the cervical spine were also generated. COMPARISON:  CT head 03/05/2018, CT head and cervical spine 03/26/2019 FINDINGS: CT HEAD FINDINGS Brain: Remote encephalomalacia in the right anterior frontal lobe/superior frontal gyrus compatible with site of prior hemorrhage. No evidence of acute infarction,  hemorrhage, hydrocephalus, extra-axial collection, visible mass lesion or mass effect. Slightly age advanced generalized parenchymal atrophy. Basal cisterns are patent. Midline intracranial structures are unremarkable. Vascular: No hyperdense vessel or unexpected calcification. Skull: Multiple areas of scalp thickening noted in the left frontal, bilateral parietal and right occipital scalp. No subjacent calvarial fracture or acute osseous injury is seen. Sinuses/Orbits: Paranasal sinuses and mastoid air cells are predominantly clear. Included orbital structures are unremarkable. Other: None CT CERVICAL SPINE FINDINGS Alignment: Straightening and slight reversal the normal cervical lordosis. Possibly related to cervical flexion noted on scout view. No evidence of traumatic listhesis. No abnormally widened, perched or jumped facets. Normal alignment of the craniocervical and atlantoaxial articulations. Skull base and vertebrae: Motion artifact may limit detection of subtle injury and mimic cortical step-off, particularly involving the C2-C6 levels. No acute skull base fracture. No vertebral body fracture or height loss. Normal bone mineralization. No worrisome osseous lesions. Soft tissues and spinal canal: No pre or paravertebral fluid or swelling. No visible canal hematoma. Airways patent. No worrisome cervical adenopathy. Disc levels: No significant central canal or foraminal stenosis identified within the imaged levels of the spine. Upper chest: No acute abnormality in the upper chest or imaged lung apices. Other: Normal thyroid. IMPRESSION: Multiple sites of scalp swelling and thickening, correlate for contusive changes. No calvarial fracture. No acute intracranial abnormality. Focus of encephalomalacia in the right anterior frontal lobe/superior frontal gyrus compatible with site of remote hemorrhage. Slightly age advanced parenchymal volume loss. No acute cervical spine fracture or traumatic listhesis.  Evaluation limited by motion artifact, most pronounced C3-C6. Electronically Signed   By: Kreg Shropshire  M.D.   On: 11/07/2020 21:44   CT ABDOMEN PELVIS W CONTRAST  Result Date: 11/07/2020 CLINICAL DATA:  40 year old female with trauma. EXAM: CT CHEST, ABDOMEN, AND PELVIS WITH CONTRAST TECHNIQUE: Multidetector CT imaging of the chest, abdomen and pelvis was performed following the standard protocol during bolus administration of intravenous contrast. CONTRAST:  80mL OMNIPAQUE IOHEXOL 350 MG/ML SOLN COMPARISON:  Chest and pelvic radiograph dated 11/07/2020. FINDINGS: CT CHEST FINDINGS Cardiovascular: Top-normal cardiac size. No pericardial effusion. The thoracic aorta is unremarkable. The origins of the great vessels of the aortic arch appear patent. The central pulmonary arteries appear unremarkable. Mediastinum/Nodes: No hilar or mediastinal adenopathy. The esophagus and the thyroid gland are grossly unremarkable. No mediastinal fluid collection. Lungs/Pleura: The lungs are clear. There is no pleural effusion pneumothorax. The central airways are patent. Musculoskeletal: Multiple subacute or chronic left rib fractures as well as several old healed right rib fractures. There are however compression fractures of superior endplates of T7 and T10 with less than 20% loss of vertebral body height and. These are age indeterminate but acute fractures are not excluded correlation with clinical exam and point tenderness recommended. MRI may provide better evaluation if clinically indicated. No retropulsion. CT ABDOMEN PELVIS FINDINGS No intra-abdominal free air or free fluid. Hepatobiliary: No focal liver abnormality is seen. No gallstones, gallbladder wall thickening, or biliary dilatation. Pancreas: Unremarkable. No pancreatic ductal dilatation or surrounding inflammatory changes. Spleen: Normal in size without focal abnormality. Adrenals/Urinary Tract: The adrenal glands unremarkable. The kidneys, visualized ureters,  and urinary bladder appear unremarkable. Stomach/Bowel: There is no bowel obstruction or active inflammation. The appendix is normal. Vascular/Lymphatic: Mild noncalcified plaque of the abdominal aorta. The IVC is unremarkable. No portal venous gas. There is no adenopathy. Reproductive: The uterus and ovaries are grossly unremarkable. Other: None Musculoskeletal: No acute osseous pathology. IMPRESSION: 1. Age indeterminate, possibly acute or subacute, mild compression fractures of the superior endplates of T7 and T10. Correlation with point tenderness recommended. 2. Multiple subacute or old left rib fractures as well as several old healed right rib fractures. 3. No acute/traumatic intra-abdominal or pelvic pathology. 4. Aortic Atherosclerosis (ICD10-I70.0). Electronically Signed   By: Elgie Collard M.D.   On: 11/07/2020 23:58   DG Pelvis Portable  Result Date: 11/07/2020 CLINICAL DATA:  Status post fall. EXAM: PORTABLE PELVIS 1-2 VIEWS COMPARISON:  None. FINDINGS: There is no evidence of pelvic fracture or diastasis. No pelvic bone lesions are seen. IMPRESSION: Negative. Electronically Signed   By: Aram Candela M.D.   On: 11/07/2020 22:33   DG Chest Port 1 View  Result Date: 11/07/2020 CLINICAL DATA:  We, seizures EXAM: PORTABLE CHEST 1 VIEW COMPARISON:  03/26/2019 FINDINGS: The patient is rotated resulting in a widened appearance of the mediastinum. There is no focal airspace consolidation. There are multiple rib injury bilaterally, including the anterior left third and fourth ribs on the left, and the posterolateral sixth through eighth ribs. There is no visible pneumothorax. No large pleural effusion. IMPRESSION: Multiple bilateral rib fractures including ribs 3 and 4 on the left and ribs 6 through 8 on the right. No visible pneumothorax. No focal airspace disease. Electronically Signed   By: Caprice Renshaw   On: 11/07/2020 21:41     Labs:   Basic Metabolic Panel: Recent Labs  Lab  11/07/20 2220 11/08/20 1610 11/08/20 1506 11/09/20 0319 11/10/20 0317 11/11/20 0339  NA 137 135  --  135 135 138  K 3.2* 2.7*   < > 3.0* 3.1* 3.8  CL 95* 102  --  103 106 103  CO2 23 22  --  17* 22 28  GLUCOSE 114* 115*  --  114* 95 101*  BUN 7 <5*  --  <5* 2* 6  CREATININE 0.48 0.34*  --  0.58 0.49 0.51  CALCIUM 8.9 7.4*  --  7.7* 8.3* 9.3  MG 1.6*  --   --  1.5* 1.8  --   PHOS 2.4* 1.3*  --  1.7*  --   --    < > = values in this interval not displayed.   GFR Estimated Creatinine Clearance: 60.2 mL/min (by C-G formula based on SCr of 0.51 mg/dL). Liver Function Tests: Recent Labs  Lab 11/07/20 2220 11/08/20 0613 11/09/20 0319 11/10/20 0317  AST 45* 41 39 36  ALT 25 22 18 17   ALKPHOS 94 82 76 62  BILITOT 1.2 1.1 1.1 1.2  PROT 7.9 6.8 6.2* 5.5*  ALBUMIN 4.1 3.5 3.0* 2.7*   No results for input(s): LIPASE, AMYLASE in the last 168 hours. Recent Labs  Lab 11/11/20 0339  AMMONIA 21   Coagulation profile Recent Labs  Lab 11/07/20 2220  INR 0.9    CBC: Recent Labs  Lab 11/07/20 2220 11/08/20 0613 11/09/20 0319 11/10/20 0317  WBC 10.9* 12.4* 8.9 5.8  NEUTROABS 9.6*  --   --   --   HGB 13.2 12.4 11.6* 9.5*  HCT 38.4 37.0 35.2* 29.0*  MCV 105.8* 108.2* 108.6* 109.4*  PLT 185 156 173 159   Cardiac Enzymes: Recent Labs  Lab 11/07/20 2220 11/09/20 0319  CKTOTAL 550* 363*   BNP: Invalid input(s): POCBNP CBG: Recent Labs  Lab 11/07/20 2156  GLUCAP 122*   D-Dimer No results for input(s): DDIMER in the last 72 hours. Hgb A1c No results for input(s): HGBA1C in the last 72 hours. Lipid Profile No results for input(s): CHOL, HDL, LDLCALC, TRIG, CHOLHDL, LDLDIRECT in the last 72 hours. Thyroid function studies No results for input(s): TSH, T4TOTAL, T3FREE, THYROIDAB in the last 72 hours.  Invalid input(s): FREET3 Anemia work up Recent Labs    11/08/20 1927  VITAMINB12 320   Microbiology Recent Results (from the past 240 hour(s))  Resp Panel by  RT-PCR (Flu A&B, Covid) Nasopharyngeal Swab     Status: None   Collection Time: 11/07/20  9:42 PM   Specimen: Nasopharyngeal Swab; Nasopharyngeal(NP) swabs in vial transport medium  Result Value Ref Range Status   SARS Coronavirus 2 by RT PCR NEGATIVE NEGATIVE Final    Comment: (NOTE) SARS-CoV-2 target nucleic acids are NOT DETECTED.  The SARS-CoV-2 RNA is generally detectable in upper respiratory specimens during the acute phase of infection. The lowest concentration of SARS-CoV-2 viral copies this assay can detect is 138 copies/mL. A negative result does not preclude SARS-Cov-2 infection and should not be used as the sole basis for treatment or other patient management decisions. A negative result may occur with  improper specimen collection/handling, submission of specimen other than nasopharyngeal swab, presence of viral mutation(s) within the areas targeted by this assay, and inadequate number of viral copies(<138 copies/mL). A negative result must be combined with clinical observations, patient history, and epidemiological information. The expected result is Negative.  Fact Sheet for Patients:  11/09/20  Fact Sheet for Healthcare Providers:  BloggerCourse.com  This test is no t yet approved or cleared by the [employer] FDA and  has been authorized for detection and/or diagnosis of SARS-CoV-2 by FDA under an Emergency Use Authorization (EUA). This  EUA will remain  in effect (meaning this test can be used) for the duration of the COVID-19 declaration under Section 564(b)(1) of the Act, 21 U.S.C.section 360bbb-3(b)(1), unless the authorization is terminated  or revoked sooner.       Influenza A by PCR NEGATIVE NEGATIVE Final   Influenza B by PCR NEGATIVE NEGATIVE Final    Comment: (NOTE) The Xpert Xpress SARS-CoV-2/FLU/RSV plus assay is intended as an aid in the diagnosis of influenza from Nasopharyngeal swab  specimens and should not be used as a sole basis for treatment. Nasal washings and aspirates are unacceptable for Xpert Xpress SARS-CoV-2/FLU/RSV testing.  Fact Sheet for Patients: BloggerCourse.com  Fact Sheet for Healthcare Providers: SeriousBroker.it  This test is not yet approved or cleared by the [place] FDA and has been authorized for detection and/or diagnosis of SARS-CoV-2 by FDA under an Emergency Use Authorization (EUA). This EUA will remain in effect (meaning this test can be used) for the duration of the COVID-19 declaration under Section 564(b)(1) of the Act, 21 U.S.C. section 360bbb-3(b)(1), unless the authorization is terminated or revoked.  Performed at [facility], [address] [place], [place] [address]   Blood Culture (routine x 2)     Status: Abnormal   Collection Time: 11/07/20 10:20 PM   Specimen: BLOOD LEFT FOREARM  Result Value Ref Range Status   Specimen Description   Final    BLOOD LEFT FOREARM Performed at [facility], [address], [place], [place] [address]    Special Requests   Final    BOTTLES DRAWN AEROBIC AND ANAEROBIC Blood Culture adequate volume Performed at [facility], [address], [place], [place] [address]    Culture  Setup Time (A)  Final    GRAM VARIABLE ROD AEROBIC BOTTLE ONLY CRITICAL VALUE NOTED.  VALUE IS CONSISTENT WITH PREVIOUSLY REPORTED AND CALLED VALUE.    Culture (A)  Final    BACILLUS SPECIES Standardized susceptibility testing for this organism is not available. Performed at [facility], [address] [place], [place] [address]    Report Status 11/10/2020 FINAL  Final  Blood Culture (routine x 2)     Status: Abnormal   Collection Time: 11/07/20 10:20 PM   Specimen: BLOOD LEFT FOREARM  Result Value Ref Range Status   Specimen Description   Final    BLOOD LEFT FOREARM Performed at [facility], [address], [place], [place] [address]    Special Requests   Final    BOTTLES DRAWN AEROBIC AND ANAEROBIC Blood Culture adequate volume Performed at [facility], [address], [place], [place] [address]    Culture  Setup Time   Final    ANAEROBIC BOTTLE ONLY GRAM VARIABLE ROD Organism ID to follow CRITICAL RESULT CALLED TO, READ BACK BY AND VERIFIED WITH: J LEGGE PHARMD 1708 11/08/20 A BROWNING    Culture (A)  Final    BACILLUS SPECIES Standardized susceptibility testing for this organism is not available. STAPHYLOCOCCUS HOMINIS THE SIGNIFICANCE OF ISOLATING THIS ORGANISM FROM A SINGLE SET OF BLOOD CULTURES WHEN MULTIPLE SETS ARE DRAWN IS UNCERTAIN. PLEASE NOTIFY THE MICROBIOLOGY DEPARTMENT WITHIN ONE WEEK IF SPECIATION AND SENSITIVITIES ARE REQUIRED. CRITICAL RESULT CALLED TO, READ BACK BY AND VERIFIED WITH: Enzo Montgomery [id number]  1027 FCP Performed at [facility], [address] [place], [place] [address]    Report Status 11/10/2020 FINAL  Final  Blood Culture ID Panel (Reflexed)  Status: Abnormal   Collection Time: 11/07/20 10:20 PM  Result Value Ref Range Status   Enterococcus faecalis NOT DETECTED NOT DETECTED Final   Enterococcus Faecium NOT DETECTED NOT DETECTED Final   Listeria monocytogenes NOT DETECTED NOT DETECTED Final   Staphylococcus species DETECTED (A) NOT DETECTED Final    Comment: CRITICAL RESULT CALLED TO, READ BACK BY AND VERIFIED WITH: J LEGGE PHARMD 1708 11/08/20 A BROWNING    Staphylococcus aureus (BCID) NOT DETECTED NOT DETECTED Final   Staphylococcus epidermidis NOT DETECTED NOT DETECTED Final   Staphylococcus lugdunensis NOT DETECTED NOT DETECTED Final   Streptococcus species NOT DETECTED NOT DETECTED Final   Streptococcus agalactiae NOT DETECTED NOT DETECTED Final   Streptococcus pneumoniae NOT DETECTED NOT DETECTED Final   Streptococcus pyogenes NOT DETECTED NOT DETECTED Final   A.calcoaceticus-baumannii NOT  DETECTED NOT DETECTED Final   Bacteroides fragilis NOT DETECTED NOT DETECTED Final   Enterobacterales NOT DETECTED NOT DETECTED Final   Enterobacter cloacae complex NOT DETECTED NOT DETECTED Final   Escherichia coli NOT DETECTED NOT DETECTED Final   Klebsiella aerogenes NOT DETECTED NOT DETECTED Final   Klebsiella oxytoca NOT DETECTED NOT DETECTED Final   Klebsiella pneumoniae NOT DETECTED NOT DETECTED Final   Proteus species NOT DETECTED NOT DETECTED Final   Salmonella species NOT DETECTED NOT DETECTED Final   Serratia marcescens NOT DETECTED NOT DETECTED Final   Haemophilus influenzae NOT DETECTED NOT DETECTED Final   Neisseria meningitidis NOT DETECTED NOT DETECTED Final   Pseudomonas aeruginosa NOT DETECTED NOT DETECTED Final   Stenotrophomonas maltophilia NOT DETECTED NOT DETECTED Final   Candida albicans NOT DETECTED NOT DETECTED Final   Candida auris NOT DETECTED NOT DETECTED Final   Candida glabrata NOT DETECTED NOT DETECTED Final   Candida krusei NOT DETECTED NOT DETECTED Final   Candida parapsilosis NOT DETECTED NOT DETECTED Final   Candida tropicalis NOT DETECTED NOT DETECTED Final   Cryptococcus neoformans/gattii NOT DETECTED NOT DETECTED Final    Comment: Performed at [facility], [address], [place], [place] [address]     Discharge Instructions:   Discharge Instructions     Ambulatory referral to Neurology   Complete by: As directed    An appointment is requested in approximately: 3-4 weeks: neurocognitive eval/seizure follow up   Call MD for:  temperature >100.4   Complete by: As directed    Diet general   Complete by: As directed    Discharge instructions   Complete by: As directed    Per [facility] statutes, patients with seizures are not allowed to drive until they have been seizure-free for six months.    Use caution when using heavy equipment or power tools. Avoid working on ladders or at heights. Take showers instead of baths. Ensure  the water temperature is not too high on the home water heater. Do not go swimming alone. Do not lock yourself in a room alone (i.e. bathroom). When caring for infants or small children, sit down when holding, feeding, or changing them to minimize risk of injury to the child in the event you have a seizure. Maintain good sleep hygiene. Avoid alcohol.    If patient has another seizure, call 911 and bring them back to the ED if: A.  The seizure lasts longer than 5 minutes.      B.  The patient doesn't wake shortly after the seizure or has new problems such as difficulty seeing, speaking or moving following the seizure C.  The patient  was injured during the seizure D.  The patient has a temperature over 102 F (39C) E.  The patient vomited during the seizure and now is having trouble breathing   Need to eat on a regular basis-- 3 meals/day plus snacks Need to stop alcohol TAKE YOUR MEDICATIONS FOR SEIZURES ON A SCHEDULE--- DO NOT MISS DOSES   Increase activity slowly   Complete by: As directed       Allergies as of 11/11/2020       Reactions   Shellfish Allergy Anaphylaxis   Flagyl [metronidazole] Hives   Penicillins Hives, Rash   Tolerated cephalosporins including ancef Has patient had a PCN reaction causing immediate rash, facial/tongue/throat swelling, SOB or lightheadedness with hypotension: Unknown Has patient had a PCN reaction causing severe rash involving mucus membranes or skin necrosis: Unknown Has patient had a PCN reaction that required hospitalization: Unknown Has patient had a PCN reaction occurring within the last 10 years: Unknown If all of the above answers are "NO", then may        Medication List     STOP taking these medications    phenytoin 125 MG/5ML suspension Commonly known as: DILANTIN Replaced by: phenytoin 50 MG tablet       TAKE these medications    folic acid 1 MG tablet Commonly known as: FOLVITE Take 1 tablet (1 mg total) by mouth daily.    levETIRAcetam 750 MG tablet Commonly known as: Keppra Take 2 tablets (1,500 mg total) by mouth 2 (two) times daily.   multivitamin with minerals Tabs tablet Take 1 tablet by mouth daily.   phenytoin 50 MG tablet Commonly known as: DILANTIN Chew 2 tablets (100 mg total) by mouth 3 (three) times daily. Replaces: phenytoin 125 MG/5ML suspension   thiamine 100 MG tablet Take 1 tablet (100 mg total) by mouth daily.        Follow-up Information     [facility] Follow up on 11/25/2020.   Why: Your appointment is at 9:50 am. Please arrive early and bring a picture ID and your current medications Contact information: [address], [address] [place] [phone]        [facility] Follow up.   Contact information: [phone]        [facility] Follow up.   Why: Please use this location for your pharmacy needs. Contact information: [address] [place] [phone]                 Time coordinating discharge: 35 min  Signed:  Joseph ArtJessica U Burkley Dech DO  [facility] 11/11/2020, 12:00 PM

## 2020-11-11 NOTE — Plan of Care (Signed)

## 2020-11-11 NOTE — Progress Notes (Signed)
CSW contacted Dennison Bulla with financial counseling to ask about screening patient for Medicaid and Disability prior to leaving today, left a voicemail. In case financial counseling can't contact the patient prior to leaving, ensured that patient's contact information was correct in the chart and added the patient's boyfriend's contact information in her chart, as well. CSW explained that financial counseling would be reaching out to her, either today or at some point in the future, to keep an eye out for that. Patient indicated understanding.  Blenda Nicely, [place] Clinical Social Worker [phone]

## 2020-11-25 ENCOUNTER — Inpatient Hospital Stay: Payer: Self-pay | Admitting: Family Medicine

## 2020-12-27 ENCOUNTER — Other Ambulatory Visit (HOSPITAL_COMMUNITY): Payer: Self-pay

## 2021-02-07 ENCOUNTER — Other Ambulatory Visit (HOSPITAL_COMMUNITY): Payer: Self-pay

## 2021-02-08 ENCOUNTER — Other Ambulatory Visit (HOSPITAL_COMMUNITY): Payer: Self-pay

## 2021-02-20 ENCOUNTER — Emergency Department (HOSPITAL_COMMUNITY): Payer: Self-pay

## 2021-02-20 ENCOUNTER — Observation Stay (HOSPITAL_COMMUNITY)
Admission: EM | Admit: 2021-02-20 | Discharge: 2021-02-21 | Disposition: A | Discharge status: Home / Self Care | Payer: Self-pay | Attending: Internal Medicine | Admitting: Internal Medicine

## 2021-02-20 ENCOUNTER — Encounter (HOSPITAL_COMMUNITY): Payer: Self-pay

## 2021-02-20 ENCOUNTER — Other Ambulatory Visit: Payer: Self-pay

## 2021-02-20 DIAGNOSIS — Z79899 Other long term (current) drug therapy: Secondary | ICD-10-CM | POA: Insufficient documentation

## 2021-02-20 DIAGNOSIS — R509 Fever, unspecified: Secondary | ICD-10-CM

## 2021-02-20 DIAGNOSIS — F17210 Nicotine dependence, cigarettes, uncomplicated: Secondary | ICD-10-CM | POA: Insufficient documentation

## 2021-02-20 DIAGNOSIS — E440 Moderate protein-calorie malnutrition: Secondary | ICD-10-CM | POA: Diagnosis present

## 2021-02-20 DIAGNOSIS — Z20822 Contact with and (suspected) exposure to covid-19: Secondary | ICD-10-CM | POA: Insufficient documentation

## 2021-02-20 DIAGNOSIS — R569 Unspecified convulsions: Secondary | ICD-10-CM

## 2021-02-20 DIAGNOSIS — G40909 Epilepsy, unspecified, not intractable, without status epilepticus: Principal | ICD-10-CM | POA: Insufficient documentation

## 2021-02-20 DIAGNOSIS — S069XAA Unspecified intracranial injury with loss of consciousness status unknown, initial encounter: Secondary | ICD-10-CM | POA: Diagnosis present

## 2021-02-20 DIAGNOSIS — Z87820 Personal history of traumatic brain injury: Secondary | ICD-10-CM | POA: Diagnosis present

## 2021-02-20 DIAGNOSIS — Y900 Blood alcohol level of less than 20 mg/100 ml: Secondary | ICD-10-CM | POA: Insufficient documentation

## 2021-02-20 HISTORY — DX: Unspecified convulsions: R56.9

## 2021-02-20 LAB — CBC WITH DIFFERENTIAL/PLATELET
Abs Immature Granulocytes: 0.05 10*3/uL (ref 0.00–0.07)
Basophils Absolute: 0 10*3/uL (ref 0.0–0.1)
Basophils Relative: 0 %
Eosinophils Absolute: 0 10*3/uL (ref 0.0–0.5)
Eosinophils Relative: 0 %
HCT: 34.9 % — ABNORMAL LOW (ref 36.0–46.0)
Hemoglobin: 11.8 g/dL — ABNORMAL LOW (ref 12.0–15.0)
Immature Granulocytes: 0 %
Lymphocytes Relative: 3 %
Lymphs Abs: 0.4 10*3/uL — ABNORMAL LOW (ref 0.7–4.0)
MCH: 35.3 pg — ABNORMAL HIGH (ref 26.0–34.0)
MCHC: 33.8 g/dL (ref 30.0–36.0)
MCV: 104.5 fL — ABNORMAL HIGH (ref 80.0–100.0)
Monocytes Absolute: 0.5 10*3/uL (ref 0.1–1.0)
Monocytes Relative: 4 %
Neutro Abs: 13.3 10*3/uL — ABNORMAL HIGH (ref 1.7–7.7)
Neutrophils Relative %: 93 %
Platelets: 233 10*3/uL (ref 150–400)
RBC: 3.34 MIL/uL — ABNORMAL LOW (ref 3.87–5.11)
RDW: 14.1 % (ref 11.5–15.5)
WBC: 14.3 10*3/uL — ABNORMAL HIGH (ref 4.0–10.5)
nRBC: 0 % (ref 0.0–0.2)

## 2021-02-20 LAB — COMPREHENSIVE METABOLIC PANEL
ALT: 23 U/L (ref 0–44)
AST: 40 U/L (ref 15–41)
Albumin: 3.5 g/dL (ref 3.5–5.0)
Alkaline Phosphatase: 75 U/L (ref 38–126)
Anion gap: 12 (ref 5–15)
BUN: <5 mg/dL — ABNORMAL LOW (ref 6–20)
CO2: 21 mmol/L — ABNORMAL LOW (ref 22–32)
Calcium: 8.7 mg/dL — ABNORMAL LOW (ref 8.9–10.3)
Chloride: 101 mmol/L (ref 98–111)
Creatinine, Ser: 0.77 mg/dL (ref 0.44–1.00)
GFR, Estimated: >60 mL/min (ref >60)
Glucose, Bld: 162 mg/dL — ABNORMAL HIGH (ref 70–99)
Potassium: 3.7 mmol/L (ref 3.5–5.1)
Sodium: 134 mmol/L — ABNORMAL LOW (ref 135–145)
Total Bilirubin: 1.2 mg/dL (ref 0.3–1.2)
Total Protein: 7.1 g/dL (ref 6.5–8.1)

## 2021-02-20 LAB — RAPID URINE DRUG SCREEN, HOSP PERFORMED
Amphetamines: NOT DETECTED
Barbiturates: NOT DETECTED
Benzodiazepines: POSITIVE — AB
Cocaine: NOT DETECTED
Opiates: NOT DETECTED
Tetrahydrocannabinol: NOT DETECTED

## 2021-02-20 LAB — RESP PANEL BY RT-PCR (FLU A&B, COVID) ARPGX2
Influenza A by PCR: NEGATIVE
Influenza B by PCR: NEGATIVE
SARS Coronavirus 2 by RT PCR: NEGATIVE

## 2021-02-20 LAB — URINALYSIS, ROUTINE W REFLEX MICROSCOPIC
Bilirubin Urine: NEGATIVE
Glucose, UA: 50 mg/dL — AB
Ketones, ur: 20 mg/dL — AB
Leukocytes,Ua: NEGATIVE
Nitrite: NEGATIVE
Protein, ur: NEGATIVE mg/dL
Specific Gravity, Urine: 1.016 (ref 1.005–1.030)
pH: 6 (ref 5.0–8.0)

## 2021-02-20 LAB — I-STAT BETA HCG BLOOD, ED (MC, WL, AP ONLY): I-stat hCG, quantitative: <5 m[IU]/mL (ref <5)

## 2021-02-20 LAB — PHENYTOIN LEVEL, TOTAL: Phenytoin Lvl: <2.5 ug/mL — ABNORMAL LOW (ref 10.0–20.0)

## 2021-02-20 LAB — ETHANOL: Alcohol, Ethyl (B): <10 mg/dL (ref <10)

## 2021-02-20 LAB — CBG MONITORING, ED: Glucose-Capillary: 145 mg/dL — ABNORMAL HIGH (ref 70–99)

## 2021-02-20 LAB — MAGNESIUM: Magnesium: 1.6 mg/dL — ABNORMAL LOW (ref 1.7–2.4)

## 2021-02-20 MED ORDER — LEVETIRACETAM IN NACL 500 MG/100ML IV SOLN: 500.0000 mg | Freq: Two times a day (BID) | INTRAVENOUS | Status: DC

## 2021-02-20 MED ORDER — CHLORHEXIDINE GLUCONATE 0.12% ORAL RINSE (MEDLINE KIT): 15.0000 mL | Freq: Two times a day (BID) | OROMUCOSAL | Status: DC

## 2021-02-20 MED ORDER — ACETAMINOPHEN 325 MG PO TABS: 650.0000 mg | ORAL_TABLET | ORAL | Status: DC | PRN

## 2021-02-20 MED ORDER — SODIUM CHLORIDE 0.9 % IV SOLN
10.0000 mg/kg | Freq: Once | INTRAVENOUS | Status: AC
  Administered 2021-02-20: 400 mg via INTRAVENOUS
  Filled 2021-02-20: qty 8

## 2021-02-20 MED ORDER — LEVETIRACETAM IN NACL 1500 MG/100ML IV SOLN
1500.0000 mg | Freq: Two times a day (BID) | INTRAVENOUS | Status: DC
  Administered 2021-02-21 (×2): 1500 mg via INTRAVENOUS
  Filled 2021-02-20 (×3): qty 100

## 2021-02-20 MED ORDER — LACTATED RINGERS IV SOLN: INTRAVENOUS | Status: AC

## 2021-02-20 MED ORDER — THIAMINE HCL 100 MG PO TABS: 100.0000 mg | ORAL_TABLET | Freq: Every day | ORAL | Status: DC

## 2021-02-20 MED ORDER — LEVETIRACETAM IN NACL 1000 MG/100ML IV SOLN: 1000.0000 mg | Freq: Two times a day (BID) | INTRAVENOUS | Status: DC

## 2021-02-20 MED ORDER — LEVETIRACETAM IN NACL 1500 MG/100ML IV SOLN: 1500.0000 mg | Freq: Once | INTRAVENOUS | Status: DC

## 2021-02-20 MED ORDER — VANCOMYCIN HCL 1000 MG/200ML IV SOLN
1000.0000 mg | Freq: Once | INTRAVENOUS | Status: AC
  Administered 2021-02-21: 1000 mg via INTRAVENOUS
  Filled 2021-02-20: qty 200

## 2021-02-20 MED ORDER — LEVETIRACETAM IN NACL 500 MG/100ML IV SOLN
500.0000 mg | Freq: Once | INTRAVENOUS | Status: AC
  Administered 2021-02-20: 500 mg via INTRAVENOUS
  Filled 2021-02-20: qty 100

## 2021-02-20 MED ORDER — SODIUM CHLORIDE 0.9 % IV BOLUS
1000.0000 mL | Freq: Once | INTRAVENOUS | Status: AC
  Administered 2021-02-20: 1000 mL via INTRAVENOUS

## 2021-02-20 MED ORDER — LEVETIRACETAM IN NACL 1000 MG/100ML IV SOLN
1000.0000 mg | Freq: Once | INTRAVENOUS | Status: AC
  Administered 2021-02-20: 1000 mg via INTRAVENOUS
  Filled 2021-02-20: qty 100

## 2021-02-20 MED ORDER — LEVETIRACETAM 500 MG PO TABS: 1500.0000 mg | ORAL_TABLET | Freq: Two times a day (BID) | ORAL | Status: DC

## 2021-02-20 MED ORDER — ENOXAPARIN SODIUM 30 MG/0.3ML IJ SOSY: 30.0000 mg | PREFILLED_SYRINGE | INTRAMUSCULAR | Status: DC

## 2021-02-20 MED ORDER — SODIUM CHLORIDE 0.9 % IV SOLN: 75.0000 mL/h | INTRAVENOUS | Status: DC

## 2021-02-20 MED ORDER — FOLIC ACID 1 MG PO TABS: 1.0000 mg | ORAL_TABLET | Freq: Every day | ORAL | Status: DC

## 2021-02-20 MED ORDER — ACETAMINOPHEN 650 MG RE SUPP: 650.0000 mg | RECTAL | Status: DC | PRN

## 2021-02-20 MED ORDER — SODIUM CHLORIDE 0.9 % IV SOLN
100.0000 mg | Freq: Three times a day (TID) | INTRAVENOUS | Status: DC
  Administered 2021-02-21: 100 mg via INTRAVENOUS
  Filled 2021-02-20 (×7): qty 2

## 2021-02-20 MED ORDER — PHENYTOIN 50 MG PO CHEW
100.0000 mg | CHEWABLE_TABLET | Freq: Three times a day (TID) | ORAL | Status: DC
  Administered 2021-02-21: 100 mg via ORAL
  Filled 2021-02-20 (×4): qty 2

## 2021-02-20 MED ORDER — ORAL CARE MOUTH RINSE: 15.0000 mL | OROMUCOSAL | Status: DC

## 2021-02-20 MED ORDER — SODIUM CHLORIDE 0.9 % IV SOLN
2.0000 g | Freq: Once | INTRAVENOUS | Status: AC
  Administered 2021-02-20: 2 g via INTRAVENOUS
  Filled 2021-02-20: qty 20

## 2021-02-20 MED ORDER — VANCOMYCIN HCL 500 MG/100ML IV SOLN
500.0000 mg | Freq: Two times a day (BID) | INTRAVENOUS | Status: DC
  Administered 2021-02-21: 500 mg via INTRAVENOUS
  Filled 2021-02-20: qty 100

## 2021-02-20 NOTE — ED Provider Notes (Signed)
[facility]PART[facility] Provider Note   CSN: [id number] Arrival date & time: 02/20/21  1641     History Chief Complaint  Patient presents with   Seizures    Alyssa Little is a 40 y.o. female with a history of traumatic brain injury, prior subarachnoid hemorrhage, seizure disorder with noncompliance issues with medications, chronic alcohol use, presenting to emergency department with report of seizures.  The patient was last discharged from the hospital in July of this year for similar episodes.  Today EMS reports were called out to the house with the patient's boyfriend or family member reporting that she had been having seizures since about 10 AM.  EMS reports the patient has been nonverbal since they arrived, GCS of approximately 10, did have 1 witnessed tonic-clonic episode lasting about a minute.  She was given 2.5 mg of IV Versed.  Supplemental hx provided by Sig Other Kandy Garrison whom she lives with.  By  phone Collier Salina tells me she woke up "not speaking clearly, seemed out of it, which made me nervous cuz that usually happens when she's going to start having seizures.   She was getting around the apartment okay, but I hugged her around 10:30 AM before I went to work, and she went right into a seizure.  Usually when she has a seizure like that, she'll have 4-5 of them in a row, and will come out of it.  She just wasn't coming back mentally like she like normally does.  She's probably had 8 seizures today."    Collier Salina reports the patient the patient is still not taking her antiepileptics as prescribed.  He reports that she will typically take 1 pill a day in the morning, but is not taking them 2 or 3 times as prescribed.  He states she does not like taking pills.  However she has not had a seizure since July when she was in the hospital, so they felt that this medicine was working.  Patient still drinks beer every other day according to [facility], but is not  drinking liquor anymore.  Collier Salina thinks she last drank 2 days ago, and that she "sips on beer throughout the day."  Per medical record reviews, patient is prescribed Dilantin (100 mg TID) and Keppra 750 mg BID  When she was in the hospital in July she had an EEG which noted diffuse encephalopathy.  They felt that she was having breakthrough seizures due to noncompliance with her medications.  She also had a fever of unclear source at that time.  HPI     Past Medical History:  Diagnosis Date   Alcohol abuse    SAH (subarachnoid hemorrhage) ([place])    Seizure disorder ([place])    TBI (traumatic brain injury)     Patient Active Problem List   Diagnosis Date Noted   Seizure ([place]) 02/20/2021   SIRS (systemic inflammatory response syndrome) ([place]) 11/55/2080   Alcoholic ketoacidosis 22/33/6122   Post-ictal state ([place]) 11/07/2020   Streptococcal bacteremia 03/30/2019   AMS (altered mental status) 03/27/2019   Bacteremia due to methicillin susceptible Staphylococcus aureus (MSSA) 03/27/2019   Altered mental status 03/26/2019   Hyperkalemia 03/26/2019   Hyponatremia 03/26/2019   Abnormal liver function 03/26/2019   Vitamin deficiency 09/29/2018   Malnutrition ([place]) 09/29/2018   Underweight 09/29/2018   Alcohol use 09/29/2018   Diarrhea 09/29/2018   Secondary amenorrhea 09/29/2018   Rosacea 09/29/2018   Mood change 09/29/2018   Seizure-like activity ([place]) 09/29/2018  Cough 09/29/2018   Elevated liver enzymes 09/29/2018   Anemia 09/29/2018   Encephalopathy acute 06/24/2018   Lactic acidemia 06/24/2018   Open scalp wound 04/22/2018   Transaminitis    TBI (traumatic brain injury) 03/05/2018   ETOH abuse    Tachypnea    Traumatic brain injury with loss of consciousness ([place])    Sinus tachycardia    Seizures ([place])    Acute blood loss anemia    Malnutrition of moderate degree 02/26/2018   SAH (subarachnoid hemorrhage) ([place]) 02/24/2018    Past Surgical History:  Procedure  Laterality Date   CHOLECYSTECTOMY       OB History   No obstetric history on file.     Family History  Family history unknown: Yes    Social History   Tobacco Use   Smoking status: Every Day    Packs/day: 0.25    Types: Cigarettes   Smokeless tobacco: Never  Substance Use Topics   Alcohol use: Yes    Alcohol/week: 1.0 standard drink    Types: 1 Glasses of wine per week    Comment: 1 drink per day   Drug use: Never    Home Medications Prior to Admission medications   Medication Sig Start Date End Date Taking? Authorizing Provider  folic acid (FOLVITE) 1 MG tablet Take 1 tablet (1 mg total) by mouth daily. 11/11/20   Geradine Girt, DO  levETIRAcetam (KEPPRA) 750 MG tablet Take 2 tablets (1,500 mg total) by mouth 2 (two) times daily. 11/10/20   Geradine Girt, DO  Multiple Vitamin (MULTIVITAMIN WITH MINERALS) TABS tablet Take 1 tablet by mouth daily. 03/11/18   Angiulli, Lavon Paganini, PA-C  phenytoin (DILANTIN) 50 MG tablet Chew 2 tablets (100 mg total) by mouth 3 (three) times daily. 11/10/20   Geradine Girt, DO  thiamine 100 MG tablet Take 1 tablet (100 mg total) by mouth daily. 11/11/20   Geradine Girt, DO    Allergies    Shellfish allergy, Flagyl [metronidazole], and Penicillins  Review of Systems   Review of Systems  Unable to perform ROS: Mental status change (level 5 caveat)   Physical Exam Updated Vital Signs BP (!) 124/100   Pulse (!) 114   Resp 19   SpO2 97%   Physical Exam Constitutional:      General: She is not in acute distress. HENT:     Head: Normocephalic and atraumatic.  Eyes:     Conjunctiva/sclera: Conjunctivae normal.     Pupils: Pupils are equal, round, and reactive to light.  Cardiovascular:     Rate and Rhythm: Normal rate and regular rhythm.  Pulmonary:     Effort: Pulmonary effort is normal. No respiratory distress.  Abdominal:     General: There is no distension.     Tenderness: There is no abdominal tenderness.  Skin:     General: Skin is warm and dry.  Neurological:     General: No focal deficit present.     Mental Status: She is alert. Mental status is at baseline.     GCS: GCS eye subscore is 4. GCS verbal subscore is 3. GCS motor subscore is 5.     Comments: Moving all extremities    ED Results / Procedures / Treatments   Labs (all labs ordered are listed, but only abnormal results are displayed) Labs Reviewed  COMPREHENSIVE METABOLIC PANEL - Abnormal; Notable for the following components:      Result Value   Sodium 134 (*)  CO2 21 (*)    Glucose, Bld 162 (*)    BUN <5 (*)    Calcium 8.7 (*)    All other components within normal limits  CBC WITH DIFFERENTIAL/PLATELET - Abnormal; Notable for the following components:   WBC 14.3 (*)    RBC 3.34 (*)    Hemoglobin 11.8 (*)    HCT 34.9 (*)    MCV 104.5 (*)    MCH 35.3 (*)    Neutro Abs 13.3 (*)    Lymphs Abs 0.4 (*)    All other components within normal limits  MAGNESIUM - Abnormal; Notable for the following components:   Magnesium 1.6 (*)    All other components within normal limits  PHENYTOIN LEVEL, TOTAL - Abnormal; Notable for the following components:   Phenytoin Lvl <2.5 (*)    All other components within normal limits  RAPID URINE DRUG SCREEN, HOSP PERFORMED - Abnormal; Notable for the following components:   Benzodiazepines POSITIVE (*)    All other components within normal limits  URINALYSIS, ROUTINE W REFLEX MICROSCOPIC - Abnormal; Notable for the following components:   APPearance HAZY (*)    Glucose, UA 50 (*)    Hgb urine dipstick MODERATE (*)    Ketones, ur 20 (*)    Bacteria, UA RARE (*)    All other components within normal limits  CBG MONITORING, ED - Abnormal; Notable for the following components:   Glucose-Capillary 145 (*)    All other components within normal limits  RESP PANEL BY RT-PCR (FLU A&B, COVID) ARPGX2  CULTURE, BLOOD (ROUTINE X 2)  CULTURE, BLOOD (ROUTINE X 2)  ETHANOL  COMPREHENSIVE METABOLIC PANEL   CBC  I-STAT BETA HCG BLOOD, ED ([facility], [facility], [facility] ONLY)    EKG EKG Interpretation  Date/Time:  Monday February 20 2021 17:16:03 EST Ventricular Rate:  115 PR Interval:  130 QRS Duration: 86 QT Interval:  341 QTC Calculation: 472 R Axis:   52 Text Interpretation: Sinus tachycardia Confirmed by Octaviano Glow [id number]) on 02/20/2021 5:17:22 PM  Radiology CT Head Wo Contrast  Result Date: 02/20/2021 CLINICAL DATA:  Seizure. EXAM: CT HEAD WITHOUT CONTRAST TECHNIQUE: Contiguous axial images were obtained from the base of the skull through the vertex without intravenous contrast. COMPARISON:  CT head 11/07/2020 BRAIN: BRAIN Cerebral ventricle sizes are concordant with the degree of cerebral volume loss. Patchy and confluent areas of decreased attenuation are noted throughout the deep and periventricular white matter of the cerebral hemispheres bilaterally, compatible with chronic microvascular ischemic disease. No evidence of large-territorial acute infarction. No parenchymal hemorrhage. No mass lesion. No extra-axial collection. No mass effect or midline shift. No hydrocephalus. Basilar cisterns are patent. Vascular: No hyperdense vessel. Skull: No acute fracture or focal lesion. Sinuses/Orbits: Paranasal sinuses and mastoid air cells are clear. The orbits are unremarkable. Other: None. IMPRESSION: No acute intracranial abnormality. Electronically Signed   By: Iven Finn M.D.   On: 02/20/2021 18:58   DG Chest Portable 1 View  Result Date: 02/20/2021 CLINICAL DATA:  Infection/aspiration evaluation. EXAM: PORTABLE CHEST 1 VIEW COMPARISON:  11/07/2020. FINDINGS: The heart is borderline enlarged and the mediastinal structures are within normal limits. No consolidation, effusion, or pneumothorax is seen. No acute osseous abnormality is identified. IMPRESSION: No acute abnormality. Electronically Signed   By: Brett Fairy M.D.   On: 02/20/2021 22:57    Procedures Procedures   Medications Ordered in  ED Medications  cefTRIAXone (ROCEPHIN) 2 g in sodium chloride 0.9 % 100 mL IVPB (2  g Intravenous New Bag/Given 02/20/21 2326)  levETIRAcetam (KEPPRA) IVPB 1500 mg/ 100 mL premix (has no administration in time range)    Or  levETIRAcetam (KEPPRA) tablet 1,500 mg (has no administration in time range)  fosPHENYtoin (CEREBYX) 100 mg PE in sodium chloride 0.9 % 25 mL IVPB (has no administration in time range)    Or  phenytoin (DILANTIN) chewable tablet 100 mg (has no administration in time range)  vancomycin (VANCOREADY) IVPB 1000 mg/200 mL (has no administration in time range)  folic acid (FOLVITE) tablet 1 mg (has no administration in time range)  thiamine tablet 100 mg (has no administration in time range)  chlorhexidine gluconate (MEDLINE KIT) (PERIDEX) 0.12 % solution 15 mL (has no administration in time range)  MEDLINE mouth rinse (has no administration in time range)  enoxaparin (LOVENOX) injection 30 mg (has no administration in time range)  acetaminophen (TYLENOL) tablet 650 mg (has no administration in time range)    Or  acetaminophen (TYLENOL) suppository 650 mg (has no administration in time range)  vancomycin (VANCOREADY) IVPB 500 mg/100 mL (has no administration in time range)  lactated ringers infusion (has no administration in time range)  levETIRAcetam (KEPPRA) IVPB 1000 mg/100 mL premix (0 mg Intravenous Stopped 02/20/21 1718)    And  levETIRAcetam (KEPPRA) IVPB 500 mg/100 mL premix (0 mg Intravenous Stopped 02/20/21 1735)  sodium chloride 0.9 % bolus 1,000 mL (0 mLs Intravenous Stopped 02/20/21 2256)  fosPHENYtoin (CEREBYX) 400 mg PE in sodium chloride 0.9 % 25 mL IVPB (0 mg PE Intravenous Stopped 02/20/21 1959)    ED Course  I have reviewed the triage vital signs and the nursing notes.  Pertinent labs & imaging results that were available during my care of the patient were reviewed by me and considered in my medical decision making (see chart for details).  Patient is here  with several seizures that occurred today, setting of reported medical noncompliance at home per supplemental history which is obtained from the patient's boyfriend.  There does not appear to be a continuing issue of significant alcohol use, or any history of withdrawal syndrome, and I do not think this is related to etoh withdrawal.    Labs reviewed today.  Patient has a leukocytosis which may be reactive or infectious.  She has a temperature of 100.61F  Mag 1.6  UA without sig of infection Covid/flu negative UDS with benzos (given by EMS), no other drugs Glucose wnl  CTH reviewed, no acute traumatic injury or ICH noted  Phenytoin <2.5 - undetectable   Clinical Course as of 02/20/21 2341  Mon Feb 20, 2021  1706 Accucheck wnl [MT]  1706 Spoke to sig other Collier Salina by phone and updated history. [MT]  8921 Pt reassessed, now awake, eyes open, rolling onto her side, following commands but refusing to speak.  Nurse reports temp 100.61F, with HR 120's.  We'll add blood cx. Weaned off oxygen, now 97% on room air. [MT]  1941 Keppra infusing [MT]  7408 We'll also order fosphenytoin at 10 mg/kg (discussed with Ed pharmacist Suezanne Jacquet) to prevent over-sedation, and given that she is showing improvement of mental status (not in status) [MT]  1924 CTH unremarkable [MT]  2132 Phenytoin Lvl(!): <2.5 [MT]  2133 Magnesium(!): 1.6 [MT]  2133 Influenza A By PCR: NEGATIVE [MT]  2141 I spoke to dr Lorrin Goodell from neurology who will come evaluate patient.  Now appears overall improved, sitting up in bed, but still nonverbal, difficult to direct.  Her partner is present  now at bedside and states she is not back to her baseline status, normally is taking normally.  He states she had similar confusion in July lasting a day [MT]  2142 Pt has completed AED's [MT]  2207 Will initiate anitbiotics, admit, pending neuro consult regarding need for EEG.  She has no photophobia or neck stiffness of visible evidence of headache to  suggest bacterial meningitis.  Due to hx of bacteremia, we'll start on antibiotics BS with vanco + rocephin and admit.  Xray pending. [MT]  2208 Pt finished keppra and fosphenytoin [MT]  2215 Dr Lorrin Goodell recommending switching patient to ER keppra and dilantin (as once daily dosing), and agrees with overnight observation for infection/sepsis rule out, on antibiotics.  He also agrees this presentation would not be consistent with meningitis, and given how high risk an LP would be at this time with patient's noncompliance, we will continue monitoring overnight. [MT]  2223 Signed out to IM service [MT]    Clinical Course User Index [MT] Jada Fass, Carola Rhine, MD    Final Clinical Impression(s) / ED Diagnoses Final diagnoses:  Seizure ([place])  Fever, unspecified fever cause    Rx / DC Orders ED Discharge Orders     None        Wyvonnia Dusky, MD 02/20/21 2341

## 2021-02-20 NOTE — Consult Note (Signed)
NEUROLOGY CONSULTATION NOTE   Date of service: February 20, 2021 Patient Name: Alyssa Little MRN:  [id number] DOB:  July 04, 1980 Reason for consult: "Seizures x 8 and improved encephalopathy but not back to baseline" Requesting Provider: Terald Sleeper, MD _ _ _   _ __   _ __ _ _  __ __   _ __   __ _  History of Present Illness  Unnamed Alyssa Little is a 40 y.o. 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 Boyfriend at the bedside provides most of the history as patient is still disoriented, aphasic and encephalopathic.  Per boyfriend, he was leaving for work at 1030 AM in the morning, gave patient a hug and she had a GTC seizure immediately afterwards lasting 2-3 mins. She was back to her baseline afterwards. He skipped work and stayed home. He reports that she had a total of 8 seizures at home and that was too much for him to handle at home, she did not seem like she was coming out of it like she usually does so he called EMS.  She had a seizure enroute and was given 2.5mg  of Versed by EMS. She is being observed in the ED since the afternoon and has had improvement in her mentation but still encephalopathic along with a Temp of 103 and elevated WBC count to 14.3. We were asked to evaluate her.  Her significant other Alyssa Little does report that she was this way back in July when she had several seizures and took her several hours to come back to her baseline. He reports that he had flu like symptoms over the last few days and seems like she caught it too and has been lethargic over the last 2 days. No fever, no headache, no neck pain that she reported over the last few days.  She only takes her seizure meds about once a day, thinks that taking it more than that is too much for her.  She has had seizure clustering in the past. Most recently in July 2022 when she had quite a few seizures with prolonged post ictal  state and not talking for several hours.   ROS   Unable to obtain a detailed ROS due to encephalopathy, prolonged post ictal state and still not talking.  Past History   Past Medical History:  Diagnosis Date   Alcohol abuse    SAH (subarachnoid hemorrhage) (HCC)    Seizure disorder (HCC)    TBI (traumatic brain injury)    Past Surgical History:  Procedure Laterality Date   CHOLECYSTECTOMY     Family History  Family history unknown: Yes   Social History   Socioeconomic History   Marital status: Single    Spouse name: Not on file   Number of children: Not on file   Years of education: Not on file   Highest education level: Not on file  Occupational History   Not on file  Tobacco Use   Smoking status: Every Day    Packs/day: 0.25    Types: Cigarettes   Smokeless tobacco: Never  Substance and Sexual Activity   Alcohol use: Yes    Alcohol/week: 1.0 standard drink    Types: 1 Glasses of wine per week    Comment: 1 drink per day   Drug use: Never   Sexual activity: Not on file  Other Topics Concern   Not on file  Social History Narrative   ** Merged History Encounter **       Social Determinants of Health   Financial Resource Strain: Not on file  Food Insecurity: Not on file  Transportation Needs: Not on file  Physical Activity: Not on file  Stress: Not on file  Social Connections: Not on file   Allergies  Allergen Reactions   Shellfish Allergy Anaphylaxis   Flagyl [Metronidazole] Hives   Penicillins Hives and Rash    Tolerated cephalosporins including ancef  Has patient had a PCN reaction causing immediate rash, facial/tongue/throat swelling, SOB or lightheadedness with hypotension: Unknown Has patient had a PCN reaction causing severe rash involving mucus membranes or skin necrosis: Unknown Has patient had a PCN reaction that required hospitalization: Unknown Has patient had a PCN reaction occurring within the last 10 years: Unknown If all of the above  answers are "NO", then may    Medications  (Not in a hospital admission)    Vitals   Vitals:   02/20/21 2030 02/20/21 2045 02/20/21 2100 02/20/21 2115  BP: (!) 133/95 (!) 135/93 (!) 131/92 (!) 133/91  Pulse: (!) 108 (!) 111 (!) 113   Resp: (!) 24 (!) 25 18 (!) 28  SpO2: 95% 95% 96%      There is no height or weight on file to calculate BMI.  Physical Exam   General: Laying comfortably in bed; in no acute distress.  HENT: Normal oropharynx and mucosa. Normal external appearance of ears and nose.  Neck: Supple, no pain or tenderness  CV: No JVD. No peripheral edema.  Pulmonary: Symmetric Chest rise. Normal respiratory effort.  Abdomen: Soft to touch, non-tender.  Ext: No cyanosis, edema, or deformity  Skin: Rahs on her face. Normal palpation of skin.   Musculoskeletal: Normal digits and nails by inspection. No clubbing.   Neurologic Examination  Mental status/Cognition: Alert, makes eye contact, keeps eyes open for most of the encounter and looks around the room and tracks me across the room. No verbalization and does not answer any orientation questions. Speech/language: No verbalization during my evaluation. Does not follow commands, does not attempt to mouth words. No speech. Cranial nerves:   CN II Pupils equal and reactive to light, no VF deficits    CN III,IV,VI EOM intact, no gaze preference or deviation, no nystagmus    CN V normal sensation in V1, V2, and V3 segments bilaterally    CN VII no asymmetry, no nasolabial fold flattening    CN VIII Turns head to speech   CN IX & X normal palatal elevation, no uvular deviation   CN XI    CN XII    Motor:  Muscle bulk: poor, tone normal. No tremors, no stiffness, no shaking concerning for a seizure.  Spontaneously moves her head left to right with no neck stiffness, no tenderness to palpation of her head or her neck. Negative Kernigs and brudzinskis sign.  Spontaneously moves all extremities and sits up in the bed and  crosses her legs. Goes back to sleep.  Reflexes:  Right Left Comments  Pectoralis      Biceps (C5/6) 2 2   Brachioradialis (C5/6) 2 2    Triceps (C6/7) 2 2    Patellar (L3/4)   Covered her legs and would not let me examine her knee reflexes.   Achilles (S1)      Hoffman      Plantar withdraws withdraws   Jaw jerk    Sensation:  Light touch Grimaces  to pain in all extremities.   Pin prick    Temperature    Vibration   Proprioception    Coordination/Complex Motor:  Unable to assess given her encephalopathy/prolonged post ictal state. - Gait: unsafe to assess given her encephalopathy and several seizures.  Labs   CBC:  Recent Labs  Lab 02/20/21 1650  WBC 14.3*  NEUTROABS 13.3*  HGB 11.8*  HCT 34.9*  MCV 104.5*  PLT 233    Basic Metabolic Panel:  Lab Results  Component Value Date   NA 134 (L) 02/20/2021   K 3.7 02/20/2021   CO2 21 (L) 02/20/2021   GLUCOSE 162 (H) 02/20/2021   BUN <5 (L) 02/20/2021   CREATININE 0.77 02/20/2021   CALCIUM 8.7 (L) 02/20/2021   GFRNONAA >60 02/20/2021   GFRAA >60 04/09/2019   Lipid Panel: No results found for: LDLCALC HgbA1c: No results found for: HGBA1C Urine Drug Screen:     Component Value Date/Time   LABOPIA NONE DETECTED 02/20/2021 1950   COCAINSCRNUR NONE DETECTED 02/20/2021 1950   LABBENZ POSITIVE (A) 02/20/2021 1950   AMPHETMU NONE DETECTED 02/20/2021 1950   THCU NONE DETECTED 02/20/2021 1950   LABBARB NONE DETECTED 02/20/2021 1950    Alcohol Level     Component Value Date/Time   ETH <10 02/20/2021 1650    CT Head without contrast: Personally reviewed and CTH was negative for a large hypodensity concerning for a large territory infarct or hyperdensity concerning for an ICH  rEEG:  pending  Impression   Sritha Snyder is a 40 y.o. female with PMH significant for TBI complicated by seizures, SAH, EtOh use who presents to the ED with 8 seizures at home and improving encephalopathy but still non  verbal with fever, leukocytosis. I suspect that her leukocytosis and fever is reactive. She essentially had same presentation with leukocytosis and fever back in July when she had seizure clustering. She was seen by neurology and also noted to have decreased verbalization which gradually resolved.  I think it is reasonable to do one time dose of Vanc and Ceftriaxone in the ED along with infectious workup with Blood cultures x 2, Lactate, procalcitonin, CXR and UA. She will not sit still for an LP. It is essentially unsafe to attempt an LP at this point given her encephalopathy and intermittent agitation. Given lack of meningismus on exam and the fact that she has had a prior similar episode I think it is reasonable to hold off on LP instead of attempting under sedation and instead observe her overnight to monitor for further seizures. Would have a low threshold to continue meningitis coverage beyond one dose of Vanc and Ceftriaxone if she becomes hypotensive or has worsening encephalopathy or GCS.  Impression: - Seizure with prolonged post ictal encephalopathy - Non compliance with medications   Recommendations  - I ordered her home Keppra 1500mg  BID PO or IV and Phenytoin 100mg  TID PO or Fosphenytoin 100mg  TID IV. - Would recommend discussing with patient in AM if she would be interested in taking Extended release form of Keppra and Dilatin to increase compliance. Keppra extended release is essentially the same cost as regular Keppra. - Observe overnight for seizure clustering. - Infectious workup with CXR, UA, Blood cultures x 2, Procalcitonin and lactate. - Agree with a one time dose of Vanc and Ceftriaxone to cover for potential meningitis but I do not think this is meningitis given her prior similar presentation in the setting seizure clustering and she does not have  meningismus on my exam. Will have a low threshold to resume meningitis coverage. - Ativan 2mg  for seizure lasting more than 3  mins. - routine EEG in AM, I do not think she is actively seizing given improved encephalopathy over the last few hours and no clinical seizure activity observed in the ED or during my evaluation. - We will continue to follow along. ______________________________________________________________________  Plan discussed with Dr. with the ED team and with patient's significant other at bedside.  Thank you for the opportunity to take part in the care of this patient. If you have any further questions, please contact the neurology consultation attending.  Signed,  Renaye Rakers [facility] Pager Number Erick Blinks _ _ _   _ __   _ __ _ _  __ __   _ __   __ _

## 2021-02-20 NOTE — Progress Notes (Signed)
Pharmacy Antibiotic Note  Alyssa Little is a 41 y.o. female admitted on 02/20/2021 with sepsis.  Pharmacy has been consulted for vancomycin dosing. WBC elevated, SCr wnl  Plan: -Vancomycin 1 gm IV  load followed by Vancomycin 500 mg IV Q 12 hrs. Goal AUC 400-550. Expected AUC: 501 SCr used: 0.77 -Monitor CBC, renal fx, cultures and clinical progress -Vanc levels as indicated  -Ceftriaxone per MD       No data recorded.  Recent Labs  Lab 02/20/21 1650  WBC 14.3*  CREATININE 0.77    CrCl cannot be calculated (Unknown ideal weight.).    Allergies  Allergen Reactions   Shellfish Allergy Anaphylaxis   Flagyl [Metronidazole] Hives   Penicillins Hives and Rash    Tolerated cephalosporins including ancef  Has patient had a PCN reaction causing immediate rash, facial/tongue/throat swelling, SOB or lightheadedness with hypotension: Unknown Has patient had a PCN reaction causing severe rash involving mucus membranes or skin necrosis: Unknown Has patient had a PCN reaction that required hospitalization: Unknown Has patient had a PCN reaction occurring within the last 10 years: Unknown If all of the above answers are "NO", then may    Antimicrobials this admission: Vancomycin 11/7 >>  Ceftriaxone 11/7 >>   Dose adjustments this admission:  Microbiology results: 11/7 BCx:    Thank you for allowing pharmacy to be a part of this patient's care.  Vinnie Level, PharmD., BCPS, BCCCP Clinical Pharmacist Please refer to [facility] for unit-specific pharmacist

## 2021-02-20 NOTE — ED Triage Notes (Signed)
Pt BIBA via [facility]. Per medic, pt has been seizing since 10am. Per medic, this is patient's 8th seizure. Per medic, family stated that pt didn't take dilantin this AM. CBG 184, BP 140/90. Upon arrival with medic, pt had grand mal seizure. Received 2.5mg  of versed via 20G IV in left hand. Pt lethargic on non- rebreather with nasal airway in place.

## 2021-02-21 ENCOUNTER — Observation Stay (HOSPITAL_COMMUNITY): Payer: Self-pay

## 2021-02-21 ENCOUNTER — Other Ambulatory Visit (HOSPITAL_COMMUNITY): Payer: Self-pay

## 2021-02-21 ENCOUNTER — Other Ambulatory Visit: Payer: Self-pay

## 2021-02-21 DIAGNOSIS — R569 Unspecified convulsions: Secondary | ICD-10-CM

## 2021-02-21 DIAGNOSIS — S069X3S Unspecified intracranial injury with loss of consciousness of 1 hour to 5 hours 59 minutes, sequela: Secondary | ICD-10-CM

## 2021-02-21 LAB — COMPREHENSIVE METABOLIC PANEL
ALT: 21 U/L (ref 0–44)
AST: 46 U/L — ABNORMAL HIGH (ref 15–41)
Albumin: 3.5 g/dL (ref 3.5–5.0)
Alkaline Phosphatase: 66 U/L (ref 38–126)
Anion gap: 16 — ABNORMAL HIGH (ref 5–15)
BUN: <5 mg/dL — ABNORMAL LOW (ref 6–20)
CO2: 17 mmol/L — ABNORMAL LOW (ref 22–32)
Calcium: 8.2 mg/dL — ABNORMAL LOW (ref 8.9–10.3)
Chloride: 98 mmol/L (ref 98–111)
Creatinine, Ser: 0.62 mg/dL (ref 0.44–1.00)
GFR, Estimated: >60 mL/min (ref >60)
Glucose, Bld: 101 mg/dL — ABNORMAL HIGH (ref 70–99)
Potassium: 3.1 mmol/L — ABNORMAL LOW (ref 3.5–5.1)
Sodium: 131 mmol/L — ABNORMAL LOW (ref 135–145)
Total Bilirubin: 1.8 mg/dL — ABNORMAL HIGH (ref 0.3–1.2)
Total Protein: 6.8 g/dL (ref 6.5–8.1)

## 2021-02-21 LAB — CBC
HCT: 34.3 % — ABNORMAL LOW (ref 36.0–46.0)
Hemoglobin: 11.6 g/dL — ABNORMAL LOW (ref 12.0–15.0)
MCH: 35.8 pg — ABNORMAL HIGH (ref 26.0–34.0)
MCHC: 33.8 g/dL (ref 30.0–36.0)
MCV: 105.9 fL — ABNORMAL HIGH (ref 80.0–100.0)
Platelets: 215 10*3/uL (ref 150–400)
RBC: 3.24 MIL/uL — ABNORMAL LOW (ref 3.87–5.11)
RDW: 14.1 % (ref 11.5–15.5)
WBC: 12.3 10*3/uL — ABNORMAL HIGH (ref 4.0–10.5)
nRBC: 0 % (ref 0.0–0.2)

## 2021-02-21 MED ORDER — PHENYTOIN SODIUM EXTENDED 100 MG PO CAPS
300.0000 mg | ORAL_CAPSULE | Freq: Every day | ORAL | 0 refills | Status: DC
  Filled 2021-02-21: qty 90, 30d supply, fill #0

## 2021-02-21 MED ORDER — POTASSIUM CHLORIDE 10 MEQ/100ML IV SOLN
10.0000 meq | INTRAVENOUS | Status: AC
  Administered 2021-02-21 (×2): 10 meq via INTRAVENOUS
  Filled 2021-02-21 (×2): qty 100

## 2021-02-21 MED ORDER — LEVETIRACETAM ER 500 MG PO TB24
3000.0000 mg | ORAL_TABLET | Freq: Every day | ORAL | 0 refills | Status: DC
  Filled 2021-02-21: qty 180, 30d supply, fill #0
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
## 2021-02-21 NOTE — ED Notes (Signed)
Patient bladder scan showed 0, pt brief was wet upon bladder scan.

## 2021-02-21 NOTE — ED Notes (Signed)
Assumed care of pt at 1215 to room 38C

## 2021-02-21 NOTE — Progress Notes (Signed)
PT Cancellation Note  Patient Details Name: Alyssa Little MRN: [id number] DOB: 17-Jul-1980   Cancelled Treatment:    Reason Eval/Treat Not Completed: Medical issues which prohibited therapy Pt currently agitated and in restraints. Not appropriate for PT at this time. Will hold and follow up as schedule allows.   Cindee Salt, DPT  Acute Rehabilitation Services  Pager: [phone] Office: [phone]   Lehman Prom 02/21/2021, 9:41 AM

## 2021-02-21 NOTE — Discharge Summary (Addendum)
Name: Alyssa Little MRN: [id number] DOB: 08/25/1980 40 y.o. PCP: Pcp, No  Date of Admission: 02/20/2021  4:41 PM Date of Discharge:  02/21/2021 Attending Physician: Dr. Antony Contras  DISCHARGE DIAGNOSIS:  Primary Problem: Seizure [facility])   Hospital Problems: Principal Problem:   Seizure [facility]) Active Problems:   Malnutrition of moderate degree   TBI (traumatic brain injury)    DISCHARGE MEDICATIONS:   Allergies as of 02/21/2021       Reactions   Shellfish Allergy Anaphylaxis   Flagyl [metronidazole] Hives   Penicillins Hives, Rash   Tolerated cephalosporins including ancef Has patient had a PCN reaction causing immediate rash, facial/tongue/throat swelling, SOB or lightheadedness with hypotension: Unknown Has patient had a PCN reaction causing severe rash involving mucus membranes or skin necrosis: Unknown Has patient had a PCN reaction that required hospitalization: Unknown Has patient had a PCN reaction occurring within the last 10 years: Unknown If all of the above answers are "NO", then may        Medication List     STOP taking these medications    phenytoin 50 MG tablet Commonly known as: DILANTIN       TAKE these medications    folic acid 1 MG tablet Commonly known as: FOLVITE Take 1 tablet (1 mg total) by mouth daily.   levETIRAcetam 1000 MG tablet Commonly known as: KEPPRA Take 3 tablets (3,000 mg total) by mouth at bedtime. What changed:  medication strength how much to take when to take this   multivitamin with minerals Tabs tablet Take 1 tablet by mouth daily.   phenytoin 100 MG ER capsule Commonly known as: Dilantin Take 3 capsules (300 mg total) by mouth at bedtime.   thiamine 100 MG tablet Take 1 tablet (100 mg total) by mouth daily.        DISPOSITION AND FOLLOW-UP:  Ms.Cassey Kameah Rawl was discharged from [facility] in Stable condition. At the hospital follow up visit please  address:  Follow-up Recommendations: Consults: Neurology (recommend outpatient referral), SLP for cognitive / language evaluation  Labs:  none Studies: DEXA scan recommended in outpatient setting (history of multiple fractures) Medications: Keppra 3000 mg ER nightly. Phenytoin 300 mg ER nightly.   Follow-up Appointments:   HOSPITAL COURSE:  Patient Summary:  Patient Summary  Alyssa Little is a 40 year old female with a history of traumatic SAH with subsequent seizure disorder and alcohol use disorder who was brought by EMS after 8 seizures likely in the context of not taking her medication. Hospital course is outlined below.   ED Course Patient received Upon arrival to the ED, patient was noted to have a mildly elevated temp of 101.3 and leukocytosis of 14.3. Head CT, CXR, and ethanol negative. Infectious workup was initiated and patient received 1 dose of vancomycin and ceftriaxone, and blood cultures x2 were drawn, although her fever and leukocytosis were thought to be most likely reactive in the context of multiple seizures. Neurology was consulted and patient was restarted on home meds. Ativan 2mg  was ordered for seizures lasting more than 3 mins.   #Breakthrough seizures in the context of not taking medications #Prolonged postictal state  #Hx traumatic SAH  Patient presented for 8 witnessed seizures at home now status post 2.5 mg Versed on route to ED, 1500 mg Keppra loading dose and Cerebyx 400 mg loading dose, and 1 L fluid bolus.  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  Initially there was concern, given low-grade fever 100.3 and leukocytosis 14.2, that there may be an underlying infection lowering seizure threshold.  However, no source of infection has yet been identified with negative infectious work-up so far.  Repeat temperature 99.7. Patient was  treated with dose of vancomycin and ceftriaxone for meningitis though exam is reassuring and lab work is reassuring.  Low concern for alcohol withdrawal causing seizure. Patient did not have any seizure episodes during her hospitalization. She had a prolonged post-ictal state, consistent with prior hospitalizations.  Repeat EEG was consistent with known history of focal epilepsy and cortical dysfunction in L frontotemporal region which could be due to seizures or postictal state. Concern for oversedation from patient's medication resulting in non-adherence, so patient was switched to Keppra 3000mg  XR and phenytoin 300mg  XR and instructed to take them at night. At time of discharge, she was at her baseline.   #Alcohol Use Disorder  #Malnutrition  Patient was placed on CIWA protocol on 11/7, she did not require ativan during her stay. She did not exhibit any symptoms of alcohol withdrawal. Of note, patient's weight is 95 lbs here, while her weight before her TBI was 120 lbs. She received thiamine and folic acid supplementation during her stay.   Follow-up   PCP follow-up was arranged with [facility]. Patient recommended to have outpatient referral to neurology.     DISCHARGE INSTRUCTIONS:   Discharge Instructions     Call MD for:  persistant dizziness or light-headedness   Complete by: As directed    Call MD for:  temperature >100.4   Complete by: As directed    Diet general   Complete by: As directed    Increase activity slowly   Complete by: As directed        SUBJECTIVE:  Ms. Aquirre was seen and evaluated on the day of discharge. She is A&Ox4 now and is back to her baseline. She feels better and has no muscle pain, headache, abd pain, or dysuria. Patient describes that her current meds make her feel "comatose", so we have discussed switching her to the extended release formulation and she will take them at night.   Discharge Vitals:   BP 116/89   Pulse 80   Temp 98.8 F (37.1 C) (Oral)    Resp 20   Ht 5\' 2"  (1.575 m)   Wt 43.1 kg   SpO2 100%   BMI 17.38 kg/m   OBJECTIVE:  General:  Laying in bed, no acute distress CV: RRR. No LE edema  Pulmonary: Lungs CTAB, normal effort Abdominal: Soft, nontender, nondistended. Normal bowel sounds. Skin: Warm and dry. Rash on face consistent w/ long-standing rosacea Neuro: A&Ox3 Psych: Tearful and flat affect   Pertinent Labs, Studies, and Procedures:  CBC Latest Ref Rng & Units 02/21/2021 02/20/2021 11/10/2020  WBC 4.0 - 10.5 K/uL 12.3(H) 14.3(H) 5.8  Hemoglobin 12.0 - 15.0 g/dL 11.6(L) 11.8(L) 9.5(L)  Hematocrit 36.0 - 46.0 % 34.3(L) 34.9(L) 29.0(L)  Platelets 150 - 400 K/uL 215 233 159    CMP Latest Ref Rng & Units 02/21/2021 02/20/2021 11/11/2020  Glucose 70 - 99 mg/dL 101(H) 162(H) 101(H)  BUN 6 - 20 mg/dL <5(L) <5(L) 6  Creatinine 0.44 - 1.00 mg/dL 0.62 0.77 0.51  Sodium 135 - 145 mmol/L 131(L) 134(L) 138  Potassium 3.5 - 5.1 mmol/L 3.1(L) 3.7 3.8  Chloride 98 - 111 mmol/L 98 101 103  CO2 22 - 32 mmol/L 17(L) 21(L) 28  Calcium 8.9 - 10.3 mg/dL 8.2(L) 8.7(L) 9.3  Total Protein 6.5 - 8.1 g/dL 6.8 7.1 -  Total Bilirubin 0.3 - 1.2 mg/dL 1.8(H) 1.2 -  Alkaline Phos 38 - 126 U/L 66 75 -  AST 15 - 41 U/L 46(H) 40 -  ALT 0 - 44 U/L 21 23 -    CT Head Wo Contrast  Result Date: 02/20/2021 CLINICAL DATA:  Seizure. EXAM: CT HEAD WITHOUT CONTRAST TECHNIQUE: Contiguous axial images were obtained from the base of the skull through the vertex without intravenous contrast. COMPARISON:  CT head 11/07/2020 BRAIN: BRAIN Cerebral ventricle sizes are concordant with the degree of cerebral volume loss. Patchy and confluent areas of decreased attenuation are noted throughout the deep and periventricular white matter of the cerebral hemispheres bilaterally, compatible with chronic microvascular ischemic disease. No evidence of large-territorial acute infarction. No parenchymal hemorrhage. No mass lesion. No extra-axial collection. No mass effect or  midline shift. No hydrocephalus. Basilar cisterns are patent. Vascular: No hyperdense vessel. Skull: No acute fracture or focal lesion. Sinuses/Orbits: Paranasal sinuses and mastoid air cells are clear. The orbits are unremarkable. Other: None. IMPRESSION: No acute intracranial abnormality. Electronically Signed   By: Iven Finn M.D.   On: 02/20/2021 18:58   DG Chest Portable 1 View  Result Date: 02/20/2021 CLINICAL DATA:  Infection/aspiration evaluation. EXAM: PORTABLE CHEST 1 VIEW COMPARISON:  11/07/2020. FINDINGS: The heart is borderline enlarged and the mediastinal structures are within normal limits. No consolidation, effusion, or pneumothorax is seen. No acute osseous abnormality is identified. IMPRESSION: No acute abnormality. Electronically Signed   By: Brett Fairy M.D.   On: 02/20/2021 22:57   EEG adult  Result Date: 02/21/2021 Lora Havens, MD     02/21/2021  2:02 PM Patient Name: Kennley Ciaravino MRN: [id number] Epilepsy Attending: Lora Havens Referring Physician/Provider: Dr Velna Ochs Date: 02/21/2021 Duration: 22.49 mins Patient history: 40 year old female with epilepsy who presented with breakthrough seizures.  EEG to evaluate for seizures. Level of alertness: Awake, asleep AEDs during EEG study: PHT, LEV Technical aspects: This EEG study was done with scalp electrodes positioned according to the 10-20 International system of electrode placement. Electrical activity was acquired at a sampling rate of 500Hz  and reviewed with a high frequency filter of 70Hz  and a low frequency filter of 1Hz . EEG data were recorded continuously and digitally stored. Description: The posterior dominant rhythm consists of 8-9 Hz activity of moderate voltage (25-35 uV) seen predominantly in posterior head regions, symmetric and reactive to eye opening and eye closing. Sleep was characterized by vertex waves, sleep spindles (12 to 14 Hz), maximal frontocentral region. EEG showed  intermittent rhythmic 2 to 3 Hz delta slowing in left frontotemporal region. Single spike was also noted in left anterior temporal region, maximal F7. Hyperventilation and photic stimulation were not performed.   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  Additionally there is evidence of cortical dysfunction in left frontotemporal region could be secondary to seizures/ postictal state.  No seizures were seen throughout the recording. Priyanka Barbra Sarks     Signed: Buddy Duty, D.O.  Internal Medicine Resident, PGY-1 [facility]  Pager: [phone] 2:37 PM, 02/21/2021

## 2021-02-21 NOTE — ED Notes (Addendum)
Pt place in non-violent restraints d/t agitation and continuance of getting out of bed. Admitting team at bedside.

## 2021-02-21 NOTE — Procedures (Signed)
Patient Name: Alyssa Little  MRN: [id number]  Epilepsy Attending: Charlsie Quest  Referring Physician/Provider: Dr Reymundo Poll Date: 02/21/2021 Duration: 22.49 mins  Patient history: 40 year old female with epilepsy who presented with breakthrough seizures.  EEG to evaluate for seizures.  Level of alertness: Awake, asleep  AEDs during EEG study: PHT, LEV  Technical aspects: This EEG study was done with scalp electrodes positioned according to the 10-20 International system of electrode placement. Electrical activity was acquired at a sampling rate of 500Hz  and reviewed with a high frequency filter of 70Hz  and a low frequency filter of 1Hz . EEG data were recorded continuously and digitally stored.   Description: The posterior dominant rhythm consists of 8-9 Hz activity of moderate voltage (25-35 uV) seen predominantly in posterior head regions, symmetric and reactive to eye opening and eye closing. Sleep was characterized by vertex waves, sleep spindles (12 to 14 Hz), maximal frontocentral region. EEG showed intermittent rhythmic 2 to 3 Hz delta slowing in left frontotemporal region. Single spike was also noted in left anterior temporal region, maximal F7. Hyperventilation and photic stimulation were not performed.     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  Additionally there is evidence of cortical dysfunction in left frontotemporal region could be secondary to seizures/ postictal state.  No seizures were seen throughout the recording.  Alyssa Little 

## 2021-02-21 NOTE — ED Notes (Signed)
Changed patients brief and bed pad, reapplied purewike

## 2021-02-21 NOTE — Discharge Planning (Signed)
RNCM consulted regarding reinstating pt in [facility] program to afford Rx.

## 2021-02-21 NOTE — ED Notes (Signed)
RN in to assess patient, patient A&Ox4, and calm at this time. Pt removed from restraints.

## 2021-02-21 NOTE — H&P (Addendum)
Date: 02/21/2021               Patient Name:  Alyssa Little MRN: [id number]  DOB: [birth year] Age / Sex: 40 y.o., female   PCP: Pcp, No         Medical Service: Internal Medicine Teaching Service         Attending Physician: Dr. Reymundo Poll, MD    First Contact: Dr. Ned Card Pager: [phone]  Second Contact: Dr. Cyndie Chime Pager: [phone]       After Hours (After 5p/  First Contact Pager: [phone]  weekends / holidays): Second Contact Pager: [phone]   Chief Complaint: Seizures  History of Present Illness:  [name] is a 40 year old Caucasian female with past medical history significant for TBI with subarachnoid hemorrhage 2019 complicated by seizure disorder with history of non-adherence to antiepileptics, and history of alcohol abuse, who presented to [facility] ED for witnessed seizures.   The patient was last discharged from the hospital in July of this year for similar episodes. History obtained from patient's boyfriend and chart review.  Boyfriend reports earlier today he was leaving for work at 10:30 AM at which time patient gave him a hug, froze, had high-pitched vocalization after which she began shaking.  Boyfriend helped her to the floor and brought her to the bed.  Seizure lasted 2 to 3 minutes.  Boyfriend denied gaze deviation, tongue biting.  Patient did return to her baseline though proceeded to have 7 additional seizures between the hours of 1030AM and 3 PM.  Boyfriend reports she has not had a seizure since her last admission in July.  Patient has a history of medication nonadherence causing breakthrough seizures and has had previous admissions for this.  Boyfriend reports he helps her manage her medications.  He reports she takes Dilantin and another medication which she needs to take a few times a day, though he admits she likely only takes it once a day since she believes taking it multiple times a day since not good for her. Boyfriend is not sure  if patient has been prescribed a nasal spray to abort seizures lasting longer than 5 minutes.  Patient does not follow with a neurologist or PCP.  He reports that she has never had this many seizures in 1 day, and this prompted boyfriend to call EMS.  Patient had additional seizure on route and was given 2.5 mg of Versed by EMS.  She was observed in the ED and had improvement in mentation though has not returned to her neurologic baseline.  In the ED, patient had temp of 100.3 with leukocytosis to 14.3.  Patient's boyfriend reported to ED provider and neurologist that patient had flulike symptoms and lethargy over the last few days, however reports to IMTS that patient has not had the symptoms for several weeks and was otherwise at her baseline.  Boyfriend reports that patient is not working, currently working on getting disability for her.  At home she ambulates on her own, manages most ADLs though requires some assistance with dressing and washing where her boyfriend provides minimal assistance.  He states she spends much of her time watching TV.  He reports at home she might have difficulty knowing the year and the month.  Boyfriend is the main caregiver.  Family lives in [place], unclear how involved family is in patient's care.  Per chart review, back in July patient was only seldomly drinking alcohol.  No history of withdrawals.  Unclear  how much more communicative patient is at home.  Boyfriend reports at home "she is more with it".  ED Course: On arrival, patient had low-grade fever that was undocumented to 100.3, tachycardic to 105, hypertensive to 151/101.  CTh negative. Leukocytosis 14.2. Ethanol negative. Phenytoin lvl low. CBG 145. UA with rare bacteria without nitrites or leukocytes or pyuria.  CXR negative.   Meds:  Current Outpatient Medications  Medication Instructions   folic acid (FOLVITE) 1 mg, Oral, Daily   levETIRAcetam (KEPPRA) 1,500 mg, Oral, 2 times daily   Multiple Vitamin  (MULTIVITAMIN WITH MINERALS) TABS tablet 1 tablet, Oral, Daily   phenytoin (DILANTIN) 100 mg, Oral, 3 times daily   thiamine 100 mg, Oral, Daily  *Of the above medications patient currently takes Keppra and Dilantin*  Allergies: Allergies as of 02/20/2021 - Review Complete 02/20/2021  Allergen Reaction Noted   Shellfish allergy Anaphylaxis 03/05/2013   Flagyl [metronidazole] Hives 11/08/2020   Penicillins Hives and Rash 02/26/2018   Past Medical History:  Diagnosis Date   Alcohol abuse    SAH (subarachnoid hemorrhage) (HCC)    Seizure disorder (HCC)    TBI (traumatic brain injury)     Family History:  Unable to provide due to altered mental status.  Social History:  Boyfriend reports that patient is not working, currently working on getting disability for her.  At home she ambulates on her own, manages most ADLs though requires some assistance with dressing and washing where her boyfriend provides minimal assistance.  He states she spends much of her time watching TV.  He reports at home she might have difficulty knowing the year and the month.  Boyfriend is the main caregiver.  Family lives in [place], unclear how involved family is in patient's care.  Per chart review, back in July patient was only seldomly drinking alcohol.  No history of withdrawals.   Review of Systems: Unable to obtain detailed ROS due to encephalopathy, prolonged postictal state  Physical Exam: Blood pressure (!) 124/100, pulse (!) 114, resp. rate 19, SpO2 97 %. Physical Exam: General: Uncomfortable appearing Caucasian female, NAD HENT: normocephalic, atraumatic EYES: conjunctiva non-erythematous, no scleral icterus CV: regular rate, normal rhythm, no murmurs, rubs, gallops. Pulmonary: normal work of breathing on RA, lungs clear to auscultation, no rales, wheezes, rhonchi Skin: Warm and dry, pustular rosacea Neurological: Mental Status: Patient is awake, alert, does not answer questions, does not  follow simple commands, verbalized "I need to go pee" x2, otherwise nonverbal.  Speech clear.  Intermittently tracks examiner, intermittently makes eye contact.  Has difficulty focusing on examiner.  Appears restless, distracted. Cranial Nerves: II: Pupils equal, round, and reactive to light.  III,IV, VI: EOMI all cardinal directions, without ptosis or diploplia.  V: Facial sensation is symmetric  VII: Face appears symmetric.  VIII: Hearing is intact to voice Motor: Moves all 4 extremities antigravity Sensory: Sensation is grossly intact bilateral UEs & LEs Deep Tendon Reflexes: Deferred Coordination: Deferred Gait: deferred Psych: Flat affect, restless, poverty of speech   EKG: personally reviewed my interpretation is sinus tachycardia  CXR: personally reviewed my interpretation is no acute cardiopulmonary abnormality  Assessment & Plan by Problem: Active Problems:   Seizure [facility])  [name] is a 40 year old Caucasian female with past medical history significant for TBI with subarachnoid hemorrhage 2019 complicated by seizure disorder with history of non-adherence to antiepileptics, and history of alcohol abuse, who presented to [facility] ED for witnessed seizures.   #Breakthrough seizures in the setting of  antiepileptic non-adherence #Prolonged postictal encephalopathy #Concern for underlying infection lowering seizure threshold #History of TBI complicated by epilepsy Patient presented for 8 witnessed seizures at home now status post 2.5 mg Versed on route to ED, 1500 mg Keppra loading dose and Cerebyx 400 mg loading dose, and 1 L fluid bolus.  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  Initially there was concern, given low-grade fever 100.3 and leukocytosis 14.2, that there may be an underlying infection lowering seizure threshold.  However, no source of  infection has yet been identified with negative infectious work-up so far.  Repeat temperature 99.7. Patient was treated with dose of vancomycin and ceftriaxone for meningitis though exam is reassuring.  Blood cultures were obtained and are pending.  Low concern for alcohol withdrawal causing seizure.  Patient's mentation has improved in the ED, however patient is not yet back to neurologic baseline.  Per patient's boyfriend, at home patient following commands, managing some of her own ADLs.  Patient currently unable to follow commands and rarely verbalizing. Plan: -Neurology following, appreciate recs -Keppra 1500 mg twice daily (p.o. or IV) -Phenytoin 100 mg 3 times daily (p.o. or fosphenytoin IV) -Admitted for observation overnight for seizure clustering -One-to-one monitoring -Follow-up blood cultures x2 -S/p one-time dose of Vanco and ceftriaxone -Ativan 2 mg for seizure lasting longer than 3 minutes -Routine EEG 10AM -If patient actively seizing, stat EEG -Discussion with patient tomorrow morning if she would be interested in taking extended release form of Keppra and Dilantin to increase compliance. -PT eval and treat in the morning -Consider SLP cognitive eval once patient's mental status returns to baseline  #History of alcohol abuse In 2019 patient reported drinking 2 cups of brandy daily and placed on CIWA, though no withdrawal documented.  Per boyfriend patient still drinks beer every other day.  No longer drinks liquor. CIWA during exam 4 low.  Do not feel patient needs to be on CIWA at this time. Plan: -Will need to clarify alcohol intake once patient's mental status is back to baseline -Thiamine supplementation -Folic acid supplementation -Considering adding CIWA if needed -Consider TOC consult for substance use counseling  Diet: Regular  VTE: Lovenox IVF: LR 75 cc/hr Code: Full  Dispo: Admit patient to Observation with expected length of stay less than 2  midnights.  Signed: Ellison Carwin, MD 02/21/2021, 12:03 AM  Pager: [phone] After 5pm on weekdays and 1pm on weekends: On Call pager: [phone]

## 2021-02-21 NOTE — ED Notes (Signed)
Went into pt's room with ED Tech to help urinate. Pt keeps stating, "I need to pee." When redirected that she would need to sue the bedpan to urinate pt would say "No," or not respond at all. Pt has made multiple attempts to get out of the bed. Pt repeatedly keeps swinging her feet over the bed rails to get up to pee.   This RN eventually got a bedside commode to see how pt ambulates to bedside and she was unable to bear her own weight, even with the assistance of two RNs. Pt was placed back in the bed and instructed to use the bedpan. Admitting paged for assistance with pt's behavior. Orders for a sitter was placed.

## 2021-02-21 NOTE — Progress Notes (Signed)
EEG complete - results pending 

## 2021-02-21 NOTE — ED Notes (Signed)
Pt being uncooperative, yelling "No!" Keep attempting to get out of the bed and swinging legs over after this RN places it back in the bed and redirects her as to why she is unable to get out of the bed. Pt becoming agitated and uncooperative.

## 2021-02-21 NOTE — Hospital Course (Addendum)
  Patient Summary  Alyssa Little is a 40 year old female with a history of traumatic SAH with subsequent seizure disorder and alcohol use disorder who was brought by EMS after 8 seizures likely in the context of not taking her medication. Hospital course is outlined below.   ED Course Patient received Upon arrival to the ED, patient was noted to have a mildly elevated temp of 101.3 and leukocytosis of 14.3. Head CT, CXR, and ethanol negative. Infectious workup was initiated and patient received 1 dose of vancomycin and ceftriaxone, and blood cultures x2 were drawn, although her fever and leukocytosis were thought to be most likely reactive in the context of multiple seizures. Neurology was consulted and patient was restarted on home meds. Ativan 2mg  was ordered for seizures lasting more than 3 mins.   #Breakthrough seizures in the context of not taking medications #Prolonged postictal state  #Hx traumatic SAH  #Concern for underlying infection lowering seizure threshold.  Patient presented for 8 witnessed seizures at home now status post 2.5 mg Versed on route to ED, 1500 mg Keppra loading dose and Cerebyx 400 mg loading dose, and 1 L fluid bolus.  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  Initially there was concern, given low-grade fever 100.3 and leukocytosis 14.2, that there may be an underlying infection lowering seizure threshold.  However, no source of infection has yet been identified with negative infectious work-up so far.  Repeat temperature 99.7. Patient was treated with dose of vancomycin and ceftriaxone for meningitis though exam is reassuring and lab work is reassuring.  Low concern for alcohol withdrawal causing seizure. Patient did not have any seizure episodes during her hospitalization. She had a prolonged post-ictal state, consistent with prior  hospitalizations.  Repeat EEG was consistent with known history of focal epilepsy and cortical dysfunction in L frontotemporal region which could be due to seizures or postictal state. Concern for oversedation from patient's medication resulting in non-adherence, so patient was switched to Keppra 3000mg  XR and phenytoin 300mg  XR and instructed to take them at night. At time of discharge, she was at her baseline.   #Alcohol Use Disorder  #Malnutrition  Patient was placed on CIWA protocol on 11/7, she did not require ativan during her stay. She did not exhibit any symptoms of alcohol withdrawal. Of note, patient's weight is 95 lbs here, while her weight before her TBI was 120 lbs. She received thiamine and folic acid supplementation during her stay.   Follow-up   PCP follow-up was arranged with [facility]. Patient recommended to have outpatient referral to neurology.

## 2021-02-22 ENCOUNTER — Telehealth: Payer: Self-pay | Admitting: Internal Medicine

## 2021-02-22 LAB — URINE CULTURE

## 2021-02-22 NOTE — Telephone Encounter (Signed)
New TOC HFU APPT Per Dr. Ned Card  Name: Alyssa, Little MRN: [id number]  Date: 03/06/2021 Status: Sch  Time: 11:00 AM Length: 45  Visit Type: OPEN ESTABLISHED [726] Copay: $0.00  Provider: Carmel Sacramento, MD Department: [facility]     CSN: [id number]

## 2021-02-25 LAB — CULTURE, BLOOD (ROUTINE X 2)
Culture: NO GROWTH
Special Requests: ADEQUATE

## 2021-03-01 ENCOUNTER — Emergency Department (HOSPITAL_COMMUNITY)
Admission: EM | Admit: 2021-03-01 | Discharge: 2021-03-02 | Disposition: A | Discharge status: Home / Self Care | Payer: Self-pay | Attending: Emergency Medicine | Admitting: Emergency Medicine

## 2021-03-01 DIAGNOSIS — Y908 Blood alcohol level of 240 mg/100 ml or more: Secondary | ICD-10-CM | POA: Insufficient documentation

## 2021-03-01 DIAGNOSIS — R569 Unspecified convulsions: Secondary | ICD-10-CM

## 2021-03-01 DIAGNOSIS — Z79899 Other long term (current) drug therapy: Secondary | ICD-10-CM | POA: Insufficient documentation

## 2021-03-01 DIAGNOSIS — F17210 Nicotine dependence, cigarettes, uncomplicated: Secondary | ICD-10-CM | POA: Insufficient documentation

## 2021-03-01 DIAGNOSIS — G40909 Epilepsy, unspecified, not intractable, without status epilepticus: Secondary | ICD-10-CM | POA: Insufficient documentation

## 2021-03-01 DIAGNOSIS — F10920 Alcohol use, unspecified with intoxication, uncomplicated: Secondary | ICD-10-CM | POA: Insufficient documentation

## 2021-03-01 LAB — CBC WITH DIFFERENTIAL/PLATELET
Abs Immature Granulocytes: 0.02 10*3/uL (ref 0.00–0.07)
Basophils Absolute: 0.1 10*3/uL (ref 0.0–0.1)
Basophils Relative: 2 %
Eosinophils Absolute: 0.1 10*3/uL (ref 0.0–0.5)
Eosinophils Relative: 2 %
HCT: 38.6 % (ref 36.0–46.0)
Hemoglobin: 12.3 g/dL (ref 12.0–15.0)
Immature Granulocytes: 0 %
Lymphocytes Relative: 33 %
Lymphs Abs: 2 10*3/uL (ref 0.7–4.0)
MCH: 35.2 pg — ABNORMAL HIGH (ref 26.0–34.0)
MCHC: 31.9 g/dL (ref 30.0–36.0)
MCV: 110.6 fL — ABNORMAL HIGH (ref 80.0–100.0)
Monocytes Absolute: 0.7 10*3/uL (ref 0.1–1.0)
Monocytes Relative: 11 %
Neutro Abs: 3.2 10*3/uL (ref 1.7–7.7)
Neutrophils Relative %: 52 %
Platelets: 278 10*3/uL (ref 150–400)
RBC: 3.49 MIL/uL — ABNORMAL LOW (ref 3.87–5.11)
RDW: 13.9 % (ref 11.5–15.5)
WBC: 6 10*3/uL (ref 4.0–10.5)
nRBC: 0 % (ref 0.0–0.2)

## 2021-03-01 LAB — COMPREHENSIVE METABOLIC PANEL
ALT: 56 U/L — ABNORMAL HIGH (ref 0–44)
AST: 115 U/L — ABNORMAL HIGH (ref 15–41)
Albumin: 3.3 g/dL — ABNORMAL LOW (ref 3.5–5.0)
Alkaline Phosphatase: 70 U/L (ref 38–126)
Anion gap: 11 (ref 5–15)
BUN: 8 mg/dL (ref 6–20)
CO2: 23 mmol/L (ref 22–32)
Calcium: 8.6 mg/dL — ABNORMAL LOW (ref 8.9–10.3)
Chloride: 108 mmol/L (ref 98–111)
Creatinine, Ser: 0.47 mg/dL (ref 0.44–1.00)
GFR, Estimated: >60 mL/min (ref >60)
Glucose, Bld: 85 mg/dL (ref 70–99)
Potassium: 5.1 mmol/L (ref 3.5–5.1)
Sodium: 142 mmol/L (ref 135–145)
Total Bilirubin: 1.1 mg/dL (ref 0.3–1.2)
Total Protein: 5.9 g/dL — ABNORMAL LOW (ref 6.5–8.1)

## 2021-03-01 LAB — ETHANOL: Alcohol, Ethyl (B): 483 mg/dL (ref <10)

## 2021-03-01 LAB — PHENYTOIN LEVEL, TOTAL: Phenytoin Lvl: 7.4 ug/mL — ABNORMAL LOW (ref 10.0–20.0)

## 2021-03-01 MED ORDER — PHENYTOIN SODIUM EXTENDED 100 MG PO CAPS: 300.0000 mg | ORAL_CAPSULE | Freq: Once | ORAL | Status: DC

## 2021-03-01 MED ORDER — SODIUM CHLORIDE 0.9 % IV SOLN
400.0000 mg | Freq: Once | INTRAVENOUS | Status: AC
  Administered 2021-03-02: 400 mg via INTRAVENOUS
  Filled 2021-03-01: qty 8

## 2021-03-01 MED ORDER — LEVETIRACETAM IN NACL 1000 MG/100ML IV SOLN
1000.0000 mg | Freq: Once | INTRAVENOUS | Status: AC
  Administered 2021-03-01: 1000 mg via INTRAVENOUS
  Filled 2021-03-01: qty 100

## 2021-03-01 NOTE — ED Notes (Signed)
MD Adela Lank notified of ETOH 483.

## 2021-03-01 NOTE — ED Notes (Signed)
Pt increasingly becoming agitated. Pt pulled out IV and pulling cords off. MD Adela Lank notified. New IV placed and soft mittens placed on pt's hands for safety. Pt still altered. Seizure pads placed on stretcher.

## 2021-03-01 NOTE — ED Provider Notes (Signed)
  Provider Note MRN:  [id number]  Arrival date & time: 03/02/21    ED Course and Medical Decision Making  Assumed care from Dr. Adela Lank at shift change.  History of alcohol use disorder, seizure disorder here with presumed seizure, also with significant alcohol intoxication.  Will need time to metabolize and then will reassess.  6:45 AM update: Patient is clinically improving but still not quite able to ambulate.  Sitting up and participating and eating, will need reassessment.  Still anticipating discharge.  Signed out to oncoming provider at shift change.  Procedures  Final Clinical Impressions(s) / ED Diagnoses     ICD-10-CM   1. Alcoholic intoxication without complication [facility])  N19.166       ED Discharge Orders     None       Discharge Instructions   None     Elmer Sow. Pilar Plate, MD [facility] Emergency Medicine [facility] [email or web address]    Sabas Sous, MD 03/02/21 [phone]

## 2021-03-01 NOTE — ED Notes (Addendum)
Pt is resting at this time. Pt refuses to keep gown on or a sheet to cover herself. Mittens remain on patient for safety at this time.

## 2021-03-01 NOTE — ED Provider Notes (Signed)
[facility] EMERGENCY DEPARTMENT Provider Note   CSN: [id number] Arrival date & time: 03/01/21  2020     History Chief Complaint  Patient presents with   Seizures    Alyssa Little is a 40 y.o. female.  40 yo F with a history of seizures comes in with a chief complaint of seizures.  The patient reportedly had 1 this evening that was unwitnessed in the bathroom and then EMS was called.  Patient is not yet back to baseline.  She was recently in the hospital about a week ago and was started on Keppra and phenytoin.  Reportedly has been compliant with her medicines.  Patient is confused on initial exam.  Level 5 caveat.  The history is provided by the patient.  Seizures Seizure activity on arrival: no   Seizure type:  Unable to specify     Past Medical History:  Diagnosis Date   Alcohol abuse    SAH (subarachnoid hemorrhage) (HCC)    Seizure disorder (HCC)    TBI (traumatic brain injury)     Patient Active Problem List   Diagnosis Date Noted   Seizure (HCC) 02/20/2021   SIRS (systemic inflammatory response syndrome) (HCC) 11/08/2020   Alcoholic ketoacidosis 11/08/2020   Post-ictal state (HCC) 11/07/2020   Streptococcal bacteremia 03/30/2019   AMS (altered mental status) 03/27/2019   Bacteremia due to methicillin susceptible Staphylococcus aureus (MSSA) 03/27/2019   Altered mental status 03/26/2019   Hyperkalemia 03/26/2019   Hyponatremia 03/26/2019   Abnormal liver function 03/26/2019   Vitamin deficiency 09/29/2018   Malnutrition (HCC) 09/29/2018   Underweight 09/29/2018   Alcohol use 09/29/2018   Diarrhea 09/29/2018   Secondary amenorrhea 09/29/2018   Rosacea 09/29/2018   Mood change 09/29/2018   Seizure-like activity (HCC) 09/29/2018   Cough 09/29/2018   Elevated liver enzymes 09/29/2018   Anemia 09/29/2018   Encephalopathy acute 06/24/2018   Lactic acidemia 06/24/2018   Open scalp wound 04/22/2018   Transaminitis    TBI  (traumatic brain injury) 03/05/2018   ETOH abuse    Tachypnea    Traumatic brain injury with loss of consciousness (HCC)    Sinus tachycardia    Seizures (HCC)    Acute blood loss anemia    Malnutrition of moderate degree 02/26/2018   SAH (subarachnoid hemorrhage) (HCC) 02/24/2018    Past Surgical History:  Procedure Laterality Date   CHOLECYSTECTOMY       OB History   No obstetric history on file.     Family History  Family history unknown: Yes    Social History   Tobacco Use   Smoking status: Every Day    Packs/day: 0.25    Types: Cigarettes   Smokeless tobacco: Never  Substance Use Topics   Alcohol use: Yes    Alcohol/week: 1.0 standard drink    Types: 1 Glasses of wine per week    Comment: 1 drink per day   Drug use: Never    Home Medications Prior to Admission medications   Medication Sig Start Date End Date Taking? Authorizing Provider  phenytoin (DILANTIN) 30 MG ER capsule Take 2 capsules (60 mg total) by mouth daily. 03/02/21 04/01/21 Yes Kommor, Madison, MD  folic acid (FOLVITE) 1 MG tablet Take 1 tablet (1 mg total) by mouth daily. 11/11/20   Joseph Art, DO  levETIRAcetam (KEPPRA XR) 500 MG 24 hr tablet Take 6 tablets (3,000 mg total) by mouth at bedtime. 02/21/21 03/23/21  Chauncey Mann, DO  Multiple  Vitamin (MULTIVITAMIN WITH MINERALS) TABS tablet Take 1 tablet by mouth daily. 03/11/18   Angiulli, Mcarthur Rossetti, PA-C  phenytoin (DILANTIN) 100 MG ER capsule Take 3 capsules (300 mg total) by mouth at bedtime. 02/21/21 03/23/21  Atway, Derwood Kaplan, DO  thiamine 100 MG tablet Take 1 tablet (100 mg total) by mouth daily. 11/11/20   Joseph Art, DO    Allergies    Shellfish allergy, Flagyl [metronidazole], and Penicillins  Review of Systems   Review of Systems  Unable to perform ROS: Mental status change  Neurological:  Positive for seizures.   Physical Exam Updated Vital Signs BP (!) 111/91 (BP Location: Right Arm)   Pulse 93   Temp 98 F (36.7 C)  (Oral)   Resp 18   Ht 5\' 2"  (1.575 m)   Wt 43.1 kg   SpO2 98%   BMI 17.38 kg/m   Physical Exam Vitals and nursing note reviewed.  Constitutional:      General: She is not in acute distress.    Appearance: She is well-developed. She is not diaphoretic.  HENT:     Head: Normocephalic and atraumatic.  Eyes:     Pupils: Pupils are equal, round, and reactive to light.  Cardiovascular:     Rate and Rhythm: Normal rate and regular rhythm.     Heart sounds: No murmur heard.   No friction rub. No gallop.  Pulmonary:     Effort: Pulmonary effort is normal.     Breath sounds: No wheezing or rales.  Abdominal:     General: There is no distension.     Palpations: Abdomen is soft.     Tenderness: There is no abdominal tenderness.  Musculoskeletal:        General: No tenderness.     Cervical back: Normal range of motion and neck supple.  Skin:    General: Skin is warm and dry.  Neurological:     Mental Status: She is alert.  Psychiatric:        Behavior: Behavior normal.    ED Results / Procedures / Treatments   Labs (all labs ordered are listed, but only abnormal results are displayed) Labs Reviewed  PHENYTOIN LEVEL, TOTAL - Abnormal; Notable for the following components:      Result Value   Phenytoin Lvl 7.4 (*)    All other components within normal limits  COMPREHENSIVE METABOLIC PANEL - Abnormal; Notable for the following components:   Calcium 8.6 (*)    Total Protein 5.9 (*)    Albumin 3.3 (*)    AST 115 (*)    ALT 56 (*)    All other components within normal limits  ETHANOL - Abnormal; Notable for the following components:   Alcohol, Ethyl (B) 483 (*)    All other components within normal limits  CBC WITH DIFFERENTIAL/PLATELET - Abnormal; Notable for the following components:   RBC 3.49 (*)    MCV 110.6 (*)    MCH 35.2 (*)    All other components within normal limits  LEVETIRACETAM LEVEL  CBC WITH DIFFERENTIAL/PLATELET    EKG None  Radiology No results  found.  Procedures Procedures   Medications Ordered in ED Medications  levETIRAcetam (KEPPRA) IVPB 1000 mg/100 mL premix (0 mg Intravenous Stopped 03/01/21 2217)  fosPHENYtoin (CEREBYX) 400 mg PE in sodium chloride 0.9 % 25 mL IVPB (0 mg PE Intravenous Stopped 03/02/21 0011)  sodium chloride 0.9 % bolus 1,000 mL (0 mLs Intravenous Stopped 03/02/21 0312)  levETIRAcetam (KEPPRA)  tablet 1,500 mg (1,500 mg Oral Given 03/02/21 1126)    ED Course  I have reviewed the triage vital signs and the nursing notes.  Pertinent labs & imaging results that were available during my care of the patient were reviewed by me and considered in my medical decision making (see chart for details).  Clinical Course as of 03/02/21 1503  Thu Mar 02, 2021  1478 Alcohol intoxication, unwitnessed sz, pending walking trial [MK]  1015 kirkpatrick [MK]    Clinical Course User Index [MK] Kommor, Madison, MD   MDM Rules/Calculators/A&P                           40 yo F with a chief complaints of altered mental status.  Found to have seizure-like activity earlier today and sent here for evaluation.  Patient has a history of seizures.  Has a history of thought noncompliance as well.  We will load on Keppra here.  We will continue to observe.  Basic blood work EtOH.  Etoh almost 500.  Will obs for sobriety.  Signed out to Dr. Pilar Plate, please see his note for further details of care in the ED.   The patients results and plan were reviewed and discussed.   Any x-rays performed were independently reviewed by myself.   Differential diagnosis were considered with the presenting HPI.  Medications  levETIRAcetam (KEPPRA) IVPB 1000 mg/100 mL premix (0 mg Intravenous Stopped 03/01/21 2217)  fosPHENYtoin (CEREBYX) 400 mg PE in sodium chloride 0.9 % 25 mL IVPB (0 mg PE Intravenous Stopped 03/02/21 0011)  sodium chloride 0.9 % bolus 1,000 mL (0 mLs Intravenous Stopped 03/02/21 0312)  levETIRAcetam (KEPPRA) tablet 1,500 mg  (1,500 mg Oral Given 03/02/21 1126)    Vitals:   03/02/21 0945 03/02/21 1000 03/02/21 1015 03/02/21 1131  BP: (!) 116/99 104/88 (!) 146/90 (!) 111/91  Pulse: 93 84 96 93  Resp:   17 18  Temp:    98 F (36.7 C)  TempSrc:    Oral  SpO2: 100% 100% 100% 98%  Weight:      Height:        Final diagnoses:  Alcoholic intoxication without complication (HCC)  Seizure (HCC)    Admission/ observation were discussed with the admitting physician, patient and/or family and they are comfortable with the plan.   Final Clinical Impression(s) / ED Diagnoses Final diagnoses:  Alcoholic intoxication without complication (HCC)  Seizure (HCC)    Rx / DC Orders ED Discharge Orders          Ordered    phenytoin (DILANTIN) 30 MG ER capsule  Daily        03/02/21 1026             Barlow, DO 03/02/21 1503

## 2021-03-01 NOTE — ED Triage Notes (Signed)
Pt BIB EMS from home. Pt has extensive seizure hx - was seen here on the 7th for the same. Pt seizure meds changed to extended release once a day - pt reports not having meds tonight.  Per EMS pt had unwitnessed seizure in bathroom and family called EMS. EMS reports pt has not been post ictal , responsive to pain.  Pt talking but just saying ow and no - pt moving around in bed but will not answer orientation questions 22LH  BP 108/72 O2 98 HR 95 CBG 108

## 2021-03-02 ENCOUNTER — Other Ambulatory Visit: Payer: Self-pay

## 2021-03-02 MED ORDER — SODIUM CHLORIDE 0.9 % IV BOLUS
1000.0000 mL | Freq: Once | INTRAVENOUS | Status: AC
  Administered 2021-03-02: 1000 mL via INTRAVENOUS

## 2021-03-02 MED ORDER — PHENYTOIN SODIUM EXTENDED 30 MG PO CAPS: 60.0000 mg | ORAL_CAPSULE | Freq: Every day | ORAL | 0 refills | Status: DC

## 2021-03-02 MED ORDER — LEVETIRACETAM 500 MG PO TABS
1500.0000 mg | ORAL_TABLET | Freq: Once | ORAL | Status: AC
  Administered 2021-03-02: 11:00:00 1500 mg via ORAL
  Filled 2021-03-02: qty 3

## 2021-03-02 NOTE — ED Notes (Signed)
Discharge instructions reviewed with patient and significant other . Patient and significant other verbalized understanding of instructions. Follow-up care and medications were reviewed. Patient ambulatory with steady gait. VSS upon discharge.  

## 2021-03-02 NOTE — ED Notes (Signed)
MD Bero made aware of pt BP - verbal order for bolus of Normal saline

## 2021-03-02 NOTE — ED Notes (Signed)
Pt continues to pull off mittens and pull on lines/cords - pt has pulled off purewick at this time and does not wish to put it back on. Pt also does not want to put on a gown at this time. Mittens placed back on patient for safety. Pt denies any further needs at this time. Pt educated on use of call bell.

## 2021-03-02 NOTE — ED Notes (Signed)
Pt urinated on self - bed sheets changed and pt placed on purewick

## 2021-03-02 NOTE — ED Notes (Addendum)
This RN and Chiropodist ambulated pt in hallway - pt unsteady on feet at this time - MD Pilar Plate made aware - pt back in bed and hooked up to monitor, educated on use of call bell

## 2021-03-02 NOTE — ED Notes (Signed)
Ambulated pt to bathroom. Pt tolerated the short distance and seemed to be steady on her feet. Will notify MD. Patient back in bed and hooked up to monitor, educated on use of call bell.

## 2021-03-02 NOTE — Discharge Instructions (Addendum)
You were seen in the emergency department for evaluation of a seizure and alcohol intoxication.  Your neurologist has recommended adding an additional 60 mg of Dilantin in the morning.  This additional prescription was sent to the pharmacy.  Please call your neurologist today to discuss these changes.  At this time you are safe for discharge but please return the emergency department if you have new or worsening seizure activity, numbness, tingling, weakness or any other concerning symptoms.

## 2021-03-02 NOTE — ED Notes (Signed)
Pt was satting at 86% with a good pleth - pt started on 2L  - MD aware

## 2021-03-02 NOTE — ED Notes (Signed)
Pt given graham crackers and water.

## 2021-03-02 NOTE — ED Notes (Signed)
Pt is resting at this time but has taken off her O2 - pt satting good on room air at this time - will cont to monitor

## 2021-03-03 LAB — LEVETIRACETAM LEVEL: Levetiracetam Lvl: 2.2 ug/mL — ABNORMAL LOW (ref 10.0–40.0)

## 2021-03-06 ENCOUNTER — Encounter: Payer: Self-pay | Admitting: Internal Medicine

## 2021-03-21 ENCOUNTER — Ambulatory Visit (INDEPENDENT_AMBULATORY_CARE_PROVIDER_SITE_OTHER): Payer: Self-pay | Admitting: Internal Medicine

## 2021-03-21 ENCOUNTER — Encounter: Payer: Self-pay | Admitting: Internal Medicine

## 2021-03-21 ENCOUNTER — Other Ambulatory Visit (HOSPITAL_COMMUNITY): Payer: Self-pay

## 2021-03-21 ENCOUNTER — Other Ambulatory Visit: Payer: Self-pay

## 2021-03-21 VITALS — BP 112/85 | HR 86 | Ht 62.0 in | Wt 104.0 lb

## 2021-03-21 DIAGNOSIS — G40909 Epilepsy, unspecified, not intractable, without status epilepticus: Secondary | ICD-10-CM

## 2021-03-21 DIAGNOSIS — E349 Endocrine disorder, unspecified: Secondary | ICD-10-CM

## 2021-03-21 DIAGNOSIS — R569 Unspecified convulsions: Secondary | ICD-10-CM

## 2021-03-21 DIAGNOSIS — F1010 Alcohol abuse, uncomplicated: Secondary | ICD-10-CM

## 2021-03-21 DIAGNOSIS — Z0000 Encounter for general adult medical examination without abnormal findings: Secondary | ICD-10-CM

## 2021-03-21 DIAGNOSIS — R748 Abnormal levels of other serum enzymes: Secondary | ICD-10-CM

## 2021-03-21 MED ORDER — PHENYTOIN SODIUM EXTENDED 100 MG PO CAPS
300.0000 mg | ORAL_CAPSULE | Freq: Every day | ORAL | 0 refills | Status: DC
  Filled 2021-03-21 – 2021-03-22 (×2): qty 90, 30d supply, fill #0

## 2021-03-21 MED ORDER — LEVETIRACETAM ER 500 MG PO TB24
3000.0000 mg | ORAL_TABLET | Freq: Every day | ORAL | 0 refills | Status: DC
  Filled 2021-03-21 – 2021-03-22 (×2): qty 180, 30d supply, fill #0

## 2021-03-21 NOTE — Progress Notes (Signed)
   CC: New pt; here to establish care after being admitted for seizures   HPI:  [name] is a 40 y.o. female with a PMHx stated below and presents today for stated above. Please see the Encounters tab for problem-based Assessment & Plan for additional details.   Past Medical History:  Diagnosis Date   Alcohol abuse    SAH (subarachnoid hemorrhage) (HCC)    Seizure disorder (HCC)    TBI (traumatic brain injury)    Traumatic brain injury with loss of consciousness (HCC)     Current Outpatient Medications on File Prior to Visit  Medication Sig Dispense Refill   folic acid (FOLVITE) 1 MG tablet Take 1 tablet (1 mg total) by mouth daily.     levETIRAcetam (KEPPRA XR) 500 MG 24 hr tablet Take 6 tablets (3,000 mg total) by mouth at bedtime. 180 tablet 0   Multiple Vitamin (MULTIVITAMIN WITH MINERALS) TABS tablet Take 1 tablet by mouth daily.     phenytoin (DILANTIN) 100 MG ER capsule Take 3 capsules (300 mg total) by mouth at bedtime. 90 capsule 0   phenytoin (DILANTIN) 30 MG ER capsule Take 2 capsules (60 mg total) by mouth daily. 60 capsule 0   thiamine 100 MG tablet Take 1 tablet (100 mg total) by mouth daily. 30 tablet 0   No current facility-administered medications on file prior to visit.    Family History  Family history unknown: Yes    Social History   Socioeconomic History   Marital status: Single    Spouse name: Not on file   Number of children: Not on file   Years of education: Not on file   Highest education level: Not on file  Occupational History   Not on file  Tobacco Use   Smoking status: Every Day    Packs/day: 0.25    Types: Cigarettes   Smokeless tobacco: Never  Substance and Sexual Activity   Alcohol use: Yes    Alcohol/week: 1.0 standard drink    Types: 1 Glasses of wine per week    Comment: 1 drink per day   Drug use: Never   Sexual activity: Not on file  Other Topics Concern   Not on file  Social History Narrative   ** Merged  History Encounter **       Social Determinants of Health   Financial Resource Strain: Not on file  Food Insecurity: Not on file  Transportation Needs: Not on file  Physical Activity: Not on file  Stress: Not on file  Social Connections: Not on file  Intimate Partner Violence: Not on file    Review of Systems: ROS negative except for what is noted on the assessment and plan.  Vitals:   03/21/21 1510  BP: 112/85  Pulse: 86  SpO2: 100%  Weight: 104 lb (47.2 kg)  Height: 5\' 2"  (1.575 m)     Physical Exam: Constitutional: alert, well-appearing, in NAD HENT: normocephalic, atraumatic, mucous membranes moist Eyes: conjunctiva non-erythematous, EOMI Cardiovascular: RRR, no m/r/g, non-edematous bilateral LE Pulmonary/Chest: normal work of breathing on RA, LCTAB Abdominal: soft, non-tender to palpation, non-distended MSK: normal bulk and tone  Neurological: A&O x 3, 5/5 strength in bilateral upper and lower extremities Skin: warm and dry  Psych: normal behavior, normal affect    Assessment & Plan:   See Encounters Tab for problem based charting.  Patient seen with Dr. , MD  Internal Medicine Resident, PGY-1 [facility]

## 2021-03-21 NOTE — Assessment & Plan Note (Addendum)
Pt with PMHx of traumatic SAH with subsequent seizure disorder and alcohol use disorder who was admitted to [facility] hospital on 11/8 after 8 witnessed seizures in s/o of medication non-compliance. She was followed by neurology in IP setting. Her medications were adjusted to ER forms for better compliance. Current regimen includes: keppra 3000 ER nightly and phenytoin 300 ER nightly. Pt reports excellent compliance since admission. She denies any seizure-like activity since being in the hospital. Was seen in ED on 11/16 for seizure-like activity in setting of significant alcohol intoxication (ethanol level ~500) She was loaded with keppra and Levetiracetam and phenytoin levels were checked. Phenytoin level up-trending from <2.5 to 7.4 and Levetiracetam level 2.2 with no prior comparison. Given daily compliance, anticipate improvement in levels on labs today. Suspect that ED visit was more related to alcohol intoxication as she was observed for sobriety and returned to baseline shortly after. At hospital d/c, DEXA scan for hx of multiple fractures and OP neurology and SLP referrals recommended; pt offered these but declined 2/2 lack of insurance. Importance of establishing with neurology to appropriate tx, manage, and monitor recurrent seizure is emphasized. Pt expresses understanding. She is provided with orange card and CAPA paperwork to proceed with these necessary referrals.   Prescription refilled for Keppra and Phenytoin  Levetiracetam and phenytoin levels obtained today  Proceed with neurology referral at f/u   Addendum: Dilantin level low 7.3 but keppra level improved from 2.2 to 60. Informally consulted Dr Derry Lory who saw the pt during 11/6 admission. He suggested not changing med doses given lack of seizure activity, and that seizure activity monitoring is key rather than medication levels alone; the values show that there is room to up-titrate dilantin in future if she does have seizures. He suspects  medications non-adherence could be contributing to levels, give long-standing hx of non-compliance. He suggests continuing to monitor levels. Will check levels during f/u appt and continue to emphasize importance of medication adherence.

## 2021-03-21 NOTE — Assessment & Plan Note (Addendum)
Pt reports reduction in alcohol use since hospital admission. She reports that she was drinking ~4 beers a day prior, and is currently drinking ~2 beers/day. Medication to help with alcohol cessation offered, pt declined. Most recent CMP on 11/16 with AST 115 and ALT 56 in setting of heavy alcohol use; will repeat CMP today.   Continue to inquire about alcohol reduction at f/u visit F/u on CMP  Addendum CMP with resolved elevation in AST and ALT

## 2021-03-21 NOTE — Patient Instructions (Signed)
Thank you, Ms.[name] for allowing [name] to provide your care today!  Today we discussed:  Seizures: Please continue taking your medication every day without missing any doses. It will be important to follow up with neurology as well, so please fill out the paper work we provided you with so you can get help seeing neurology and speech specialists.   Lab: I got a lab to check for your kidney and liver function  Alcohol Congrats on cutting down! Please keep this up and let us know if you are interested in medicine to help with this.  I will call you if any labs results require any further attention or action.   I have ordered the following labs for you:  Lab Orders         CMP14 + Anion Gap       Referrals ordered today:   Referral Orders  No referral(s) requested today     Medications ordered:   Start the following medications: Meds ordered this encounter  Medications   phenytoin (DILANTIN) 100 MG ER capsule    Sig: Take 3 capsules (300 mg total) by mouth at bedtime.    Dispense:  90 capsule    Refill:  0    IM clinic patient   levETIRAcetam (KEPPRA XR) 500 MG 24 hr tablet    Sig: Take 6 tablets (3,000 mg total) by mouth at bedtime.    Dispense:  180 tablet    Refill:  0    IM clinic patient      Follow up in: 1 mo    Should you have any questions or concerns please call the internal medicine clinic at [phone].     Carmel Sacramento, MD  Internal Medicine Resident, PGY-1 [facility]

## 2021-03-21 NOTE — Assessment & Plan Note (Addendum)
-  Pneumococcal and flu vaccine offered, pt declined  -Pt defers pap smear to a later time -Medications refilled  -Neurology and SLP referral offered, pt declined due to lack of insurance. Emphasized the importance of neurology f/u in managing recurrent seizures. -Paperwork for orange care and CAFA provided to proceed with necessary referrals.

## 2021-03-22 ENCOUNTER — Other Ambulatory Visit (HOSPITAL_COMMUNITY): Payer: Self-pay

## 2021-03-22 LAB — CMP14 + ANION GAP
ALT: 27 IU/L (ref 0–32)
AST: 25 IU/L (ref 0–40)
Albumin/Globulin Ratio: 1.7 (ref 1.2–2.2)
Albumin: 4 g/dL (ref 3.8–4.8)
Alkaline Phosphatase: 95 IU/L (ref 44–121)
Anion Gap: 16 mmol/L (ref 10.0–18.0)
BUN/Creatinine Ratio: 16 (ref 9–23)
BUN: 8 mg/dL (ref 6–24)
Bilirubin Total: <0.2 mg/dL (ref 0.0–1.2)
CO2: 23 mmol/L (ref 20–29)
Calcium: 8.7 mg/dL (ref 8.7–10.2)
Chloride: 103 mmol/L (ref 96–106)
Creatinine, Ser: 0.49 mg/dL — ABNORMAL LOW (ref 0.57–1.00)
Globulin, Total: 2.4 g/dL (ref 1.5–4.5)
Glucose: 78 mg/dL (ref 70–99)
Potassium: 3.7 mmol/L (ref 3.5–5.2)
Sodium: 142 mmol/L (ref 134–144)
Total Protein: 6.4 g/dL (ref 6.0–8.5)
eGFR: 121 mL/min/{1.73_m2} (ref >59)

## 2021-03-22 LAB — PHENYTOIN LEVEL, TOTAL: Phenytoin (Dilantin), Serum: 7.3 ug/mL — ABNORMAL LOW (ref 10.0–20.0)

## 2021-03-22 LAB — LEVETIRACETAM LEVEL: Levetiracetam Lvl: 59.7 ug/mL — ABNORMAL HIGH (ref 10.0–40.0)

## 2021-03-23 NOTE — Progress Notes (Signed)
Internal Medicine Clinic Attending  I saw and evaluated the patient.  I personally confirmed the key portions of the history and exam documented by Dr. Patel and I reviewed pertinent patient test results.  The assessment, diagnosis, and plan were formulated together and I agree with the documentation in the resident's note.  

## 2021-04-20 ENCOUNTER — Other Ambulatory Visit (HOSPITAL_COMMUNITY): Payer: Self-pay

## 2021-04-20 ENCOUNTER — Other Ambulatory Visit: Payer: Self-pay | Admitting: Internal Medicine

## 2021-04-20 DIAGNOSIS — G40909 Epilepsy, unspecified, not intractable, without status epilepticus: Secondary | ICD-10-CM

## 2021-04-21 ENCOUNTER — Other Ambulatory Visit (HOSPITAL_COMMUNITY): Payer: Self-pay

## 2021-04-21 MED ORDER — PHENYTOIN SODIUM EXTENDED 100 MG PO CAPS
300.0000 mg | ORAL_CAPSULE | Freq: Every day | ORAL | 0 refills | Status: DC
Start: 2021-04-21 — End: 2021-05-24
  Filled 2021-04-21: qty 90, 30d supply, fill #0

## 2021-04-21 MED ORDER — LEVETIRACETAM ER 500 MG PO TB24
3000.0000 mg | ORAL_TABLET | Freq: Every day | ORAL | 0 refills | Status: DC
  Filled 2021-04-21: qty 180, 30d supply, fill #0

## 2021-05-24 ENCOUNTER — Other Ambulatory Visit: Payer: Self-pay | Admitting: Internal Medicine

## 2021-05-24 ENCOUNTER — Other Ambulatory Visit (HOSPITAL_COMMUNITY): Payer: Self-pay

## 2021-05-24 DIAGNOSIS — G40909 Epilepsy, unspecified, not intractable, without status epilepticus: Secondary | ICD-10-CM

## 2021-05-25 ENCOUNTER — Other Ambulatory Visit: Payer: Self-pay | Admitting: Internal Medicine

## 2021-05-25 ENCOUNTER — Other Ambulatory Visit (HOSPITAL_COMMUNITY): Payer: Self-pay

## 2021-05-25 DIAGNOSIS — G40909 Epilepsy, unspecified, not intractable, without status epilepticus: Secondary | ICD-10-CM

## 2021-05-25 MED ORDER — LEVETIRACETAM ER 500 MG PO TB24
3000.0000 mg | ORAL_TABLET | Freq: Every day | ORAL | 0 refills | Status: DC
  Filled 2021-05-25: qty 180, 30d supply, fill #0

## 2021-05-25 MED ORDER — PHENYTOIN SODIUM EXTENDED 100 MG PO CAPS
300.0000 mg | ORAL_CAPSULE | Freq: Every day | ORAL | 0 refills | Status: DC
  Filled 2021-05-25: qty 90, 30d supply, fill #0

## 2021-06-26 ENCOUNTER — Other Ambulatory Visit (HOSPITAL_COMMUNITY): Payer: Self-pay

## 2021-06-26 ENCOUNTER — Other Ambulatory Visit: Payer: Self-pay | Admitting: Internal Medicine

## 2021-06-26 DIAGNOSIS — G40909 Epilepsy, unspecified, not intractable, without status epilepticus: Secondary | ICD-10-CM

## 2021-06-26 MED ORDER — LEVETIRACETAM ER 500 MG PO TB24
3000.0000 mg | ORAL_TABLET | Freq: Every day | ORAL | 0 refills | Status: DC
  Filled 2021-06-26: qty 180, 30d supply, fill #0

## 2021-06-26 MED ORDER — PHENYTOIN SODIUM EXTENDED 100 MG PO CAPS
300.0000 mg | ORAL_CAPSULE | Freq: Every day | ORAL | 0 refills | Status: DC
  Filled 2021-06-26: qty 90, 30d supply, fill #0

## 2021-07-28 ENCOUNTER — Other Ambulatory Visit (HOSPITAL_COMMUNITY): Payer: Self-pay

## 2021-07-28 ENCOUNTER — Other Ambulatory Visit: Payer: Self-pay | Admitting: Internal Medicine

## 2021-07-28 DIAGNOSIS — G40909 Epilepsy, unspecified, not intractable, without status epilepticus: Secondary | ICD-10-CM

## 2021-07-28 MED ORDER — LEVETIRACETAM ER 500 MG PO TB24
3000.0000 mg | ORAL_TABLET | Freq: Every day | ORAL | 0 refills | Status: DC
  Filled 2021-07-28: qty 180, 30d supply, fill #0

## 2021-07-28 MED ORDER — PHENYTOIN SODIUM EXTENDED 100 MG PO CAPS
300.0000 mg | ORAL_CAPSULE | Freq: Every day | ORAL | 0 refills | Status: DC
  Filled 2021-07-28: qty 90, 30d supply, fill #0

## 2021-08-29 ENCOUNTER — Other Ambulatory Visit (HOSPITAL_COMMUNITY): Payer: Self-pay

## 2021-08-29 ENCOUNTER — Other Ambulatory Visit: Payer: Self-pay | Admitting: Internal Medicine

## 2021-08-29 DIAGNOSIS — G40909 Epilepsy, unspecified, not intractable, without status epilepticus: Secondary | ICD-10-CM

## 2021-08-30 ENCOUNTER — Other Ambulatory Visit (HOSPITAL_COMMUNITY): Payer: Self-pay

## 2021-08-30 MED ORDER — LEVETIRACETAM ER 500 MG PO TB24
3000.0000 mg | ORAL_TABLET | Freq: Every day | ORAL | 0 refills | Status: DC
  Filled 2021-08-30: qty 180, 30d supply, fill #0

## 2021-08-30 MED ORDER — PHENYTOIN SODIUM EXTENDED 100 MG PO CAPS
300.0000 mg | ORAL_CAPSULE | Freq: Every day | ORAL | 0 refills | Status: DC
  Filled 2021-08-30: qty 90, 30d supply, fill #0

## 2021-10-02 ENCOUNTER — Other Ambulatory Visit: Payer: Self-pay | Admitting: Internal Medicine

## 2021-10-02 ENCOUNTER — Other Ambulatory Visit (HOSPITAL_COMMUNITY): Payer: Self-pay

## 2021-10-02 DIAGNOSIS — G40909 Epilepsy, unspecified, not intractable, without status epilepticus: Secondary | ICD-10-CM

## 2021-10-02 MED ORDER — PHENYTOIN SODIUM EXTENDED 100 MG PO CAPS
300.0000 mg | ORAL_CAPSULE | Freq: Every day | ORAL | 0 refills | Status: DC
  Filled 2021-10-02: qty 90, 30d supply, fill #0

## 2021-10-02 MED ORDER — LEVETIRACETAM ER 500 MG PO TB24
3000.0000 mg | ORAL_TABLET | Freq: Every day | ORAL | 0 refills | Status: DC
  Filled 2021-10-02: qty 180, 30d supply, fill #0

## 2021-10-03 ENCOUNTER — Other Ambulatory Visit (HOSPITAL_COMMUNITY): Payer: Self-pay

## 2021-10-07 ENCOUNTER — Encounter: Payer: Self-pay | Admitting: *Deleted

## 2021-11-02 ENCOUNTER — Other Ambulatory Visit (HOSPITAL_COMMUNITY): Payer: Self-pay

## 2021-11-02 ENCOUNTER — Other Ambulatory Visit: Payer: Self-pay | Admitting: Internal Medicine

## 2021-11-02 DIAGNOSIS — G40909 Epilepsy, unspecified, not intractable, without status epilepticus: Secondary | ICD-10-CM

## 2021-11-02 MED ORDER — LEVETIRACETAM ER 500 MG PO TB24
3000.0000 mg | ORAL_TABLET | Freq: Every day | ORAL | 0 refills | Status: DC
  Filled 2021-11-02: qty 180, 30d supply, fill #0

## 2021-11-02 MED ORDER — PHENYTOIN SODIUM EXTENDED 100 MG PO CAPS
300.0000 mg | ORAL_CAPSULE | Freq: Every day | ORAL | 0 refills | Status: DC
  Filled 2021-11-02: qty 90, 30d supply, fill #0

## 2021-11-02 NOTE — Telephone Encounter (Signed)
Please have patient follow up in clinic

## 2021-11-02 NOTE — Telephone Encounter (Signed)
Please have patient follow-up in 1-2 weeks.

## 2021-11-02 NOTE — Telephone Encounter (Signed)
LOV 03/2021; f/u I 1 week.. Called pt - caregiver answered the telephone. He was informed pt needs to schedule an appt - he's agreeable; call transferred to front office. Appt scheduled w/Dr Masters Tuesday 7/25. He stated pt is completely out of meds.

## 2021-11-03 ENCOUNTER — Other Ambulatory Visit (HOSPITAL_COMMUNITY): Payer: Self-pay

## 2021-11-07 ENCOUNTER — Encounter: Payer: Self-pay | Admitting: Internal Medicine

## 2021-11-08 ENCOUNTER — Encounter: Payer: Self-pay | Admitting: Internal Medicine

## 2021-11-08 ENCOUNTER — Ambulatory Visit: Payer: Self-pay | Admitting: Internal Medicine

## 2021-11-08 ENCOUNTER — Other Ambulatory Visit: Payer: Self-pay

## 2021-11-08 VITALS — BP 123/92 | HR 81 | Temp 98.3°F | Ht 62.0 in | Wt 104.6 lb

## 2021-11-08 DIAGNOSIS — R7401 Elevation of levels of liver transaminase levels: Secondary | ICD-10-CM

## 2021-11-08 DIAGNOSIS — F1010 Alcohol abuse, uncomplicated: Secondary | ICD-10-CM

## 2021-11-08 DIAGNOSIS — G40909 Epilepsy, unspecified, not intractable, without status epilepticus: Secondary | ICD-10-CM

## 2021-11-08 DIAGNOSIS — R569 Unspecified convulsions: Secondary | ICD-10-CM

## 2021-11-08 DIAGNOSIS — F410 Panic disorder [episodic paroxysmal anxiety] without agoraphobia: Secondary | ICD-10-CM

## 2021-11-08 DIAGNOSIS — F17210 Nicotine dependence, cigarettes, uncomplicated: Secondary | ICD-10-CM

## 2021-11-08 DIAGNOSIS — Z720 Tobacco use: Secondary | ICD-10-CM

## 2021-11-08 MED ORDER — HYDROXYZINE HCL 10 MG PO TABS: 10.0000 mg | ORAL_TABLET | Freq: Three times a day (TID) | ORAL | 0 refills | Status: DC | PRN

## 2021-11-08 MED ORDER — FLUOXETINE HCL 10 MG PO CAPS: 10.0000 mg | ORAL_CAPSULE | Freq: Every day | ORAL | 3 refills | Status: DC

## 2021-11-08 NOTE — Patient Instructions (Signed)
Thank you, [name] for allowing [name] to provide your care today. Today we discussed:  Seizures I am checking some blood work today with your seizure medication.  Please fill paperwork to help get insurance.  It is very important that you follow-up with neurology.  Anxiety I am starting medication called fluoxetine.  Please take this medication daily.  It would likely take several weeks for this to take effect.  I have also sent a medication called hydroxyzine.  You can use this as needed when you are having a panic attack.  Please consider counseling in the future as this has been shown to be very helpful for some patients.    I have ordered the following labs for you:   Lab Orders         Phenytoin level, free and total         Levetiracetam level       Referrals ordered today:    Referral Orders         Ambulatory referral to Neurology      I have ordered the following medication/changed the following medications:   Stop the following medications: There are no discontinued medications.   Start the following medications: Meds ordered this encounter  Medications   FLUoxetine (PROZAC) 10 MG capsule    Sig: Take 1 capsule (10 mg total) by mouth daily.    Dispense:  30 capsule    Refill:  3     Follow up:  1 month    We look forward to seeing you next time. Please call our clinic at [phone] if you have any questions or concerns. The best time to call is Monday-Friday from 9am-4pm, but there is someone available 24/7. If after hours or the weekend, call the main hospital number and ask for the Internal Medicine Resident On-Call. If you need medication refills, please notify your pharmacy one week in advance and they will send [name] a request.   Thank you for trusting me with your care. Wishing you the best!   Rudene Christians, DO [facility]

## 2021-11-08 NOTE — Progress Notes (Unsigned)
  Seizures s/p SAH, TBI Medications: keppra phenytoin Has not seen neurology Adherent No witnessed seizures   Panic attacks 10-15 minutes 1-2 times a day for last 9-10 months  5-6 cig  since 17

## 2021-11-09 ENCOUNTER — Encounter: Payer: Self-pay | Admitting: Internal Medicine

## 2021-11-09 DIAGNOSIS — F410 Panic disorder [episodic paroxysmal anxiety] without agoraphobia: Secondary | ICD-10-CM | POA: Insufficient documentation

## 2021-11-09 DIAGNOSIS — Z720 Tobacco use: Secondary | ICD-10-CM | POA: Insufficient documentation

## 2021-11-09 NOTE — Assessment & Plan Note (Addendum)
Alyssa Little is a 41 year old with past medical history of alcohol use disorder.  She continues to drink 4-5 beers daily, 28 beers per week.  She is not interested in medication to help with alcohol cravings at this time. A/P: We will plan to continue conversations about call use disorder at future visits. Patient is not currently taking thiamine or folic acid.

## 2021-11-09 NOTE — Assessment & Plan Note (Addendum)
Patient reports history of severe anxiety episodes for last 9 to 10 months.  She states that the episodes are sudden in onset and she feels very anxious and her breathing feels tight.  The episodes last 10 to 15 minutes.  And she has been having 1-2 every day.  She drinks alcohol which seems to help with episodes somewhat. A/P: Presentation is consistent with panic attacks.  Will start on fluoxetine at 10 mg.  She was given hydroxyzine to take during acute episodes.  She is using alcohol to treat episodes currently.  We will continue conversations about alcohol cessation once her symptoms are under better control. Follow-up in 1 month

## 2021-11-09 NOTE — Assessment & Plan Note (Addendum)
Patient with past medical history of traumatic SDH with subsequent seizure disorder and alcohol use disorder presents for follow-up on seizures.  Current medications include Keppra and phenytoin.  She has been adherent with medications.  She has not had any seizures over the last 7 months.  She previously had some when she was sleeping and her partner who is with her at appointment states that she has not had further episodes since being on medications.  She was referred to neurology at appointment in December 2022.  Her levels of phenytoin 100 mg qd and Keppra 3000mg  qhs were low and Dr. was informally consulted to advise on dosing and he stated with no further seizures do not titrate medication at that time. A/P: Keppra and phenytoin levels Continue medications Referral to neurology, patient was given CAFA and orange card paperwork

## 2021-11-09 NOTE — Assessment & Plan Note (Signed)
Patient reports history of cigarette use since age 41.  She currently smokes 5 to 6 cigarettes a day.  She stopped smoking several years ago.  But restarted once she began to have seizures and felt more anxious.  She is not currently interested in smoking cessation at this time A/P: Revisit smoking cessation at future visit

## 2021-11-09 NOTE — Assessment & Plan Note (Signed)
Resolution of transaminitis seen on CMP in January.

## 2021-11-10 NOTE — Progress Notes (Signed)
Internal Medicine Clinic Attending  Case discussed with Dr. Masters  At the time of the visit.  We reviewed the resident's history and exam and pertinent patient test results.  I agree with the assessment, diagnosis, and plan of care documented in the resident's note.  

## 2021-11-30 ENCOUNTER — Other Ambulatory Visit (HOSPITAL_COMMUNITY): Payer: Self-pay

## 2021-11-30 ENCOUNTER — Other Ambulatory Visit: Payer: Self-pay | Admitting: Internal Medicine

## 2021-11-30 DIAGNOSIS — G40909 Epilepsy, unspecified, not intractable, without status epilepticus: Secondary | ICD-10-CM

## 2021-11-30 MED ORDER — LEVETIRACETAM ER 500 MG PO TB24
3000.0000 mg | ORAL_TABLET | Freq: Every day | ORAL | 3 refills | Status: DC
  Filled 2021-11-30: qty 180, 30d supply, fill #0
  Filled 2021-12-29: qty 180, 30d supply, fill #1
  Filled 2022-02-02: qty 180, 30d supply, fill #2
  Filled 2022-03-12: qty 180, 30d supply, fill #3

## 2021-11-30 MED ORDER — PHENYTOIN SODIUM EXTENDED 100 MG PO CAPS
300.0000 mg | ORAL_CAPSULE | Freq: Every day | ORAL | 3 refills | Status: DC
  Filled 2021-11-30: qty 80, 26d supply, fill #0
  Filled 2021-12-29: qty 90, 30d supply, fill #1
  Filled 2022-02-02: qty 90, 30d supply, fill #2
  Filled 2022-03-12: qty 90, 30d supply, fill #3

## 2021-12-01 ENCOUNTER — Other Ambulatory Visit (HOSPITAL_COMMUNITY): Payer: Self-pay

## 2021-12-11 ENCOUNTER — Encounter: Payer: Self-pay | Admitting: Internal Medicine

## 2021-12-29 ENCOUNTER — Other Ambulatory Visit (HOSPITAL_COMMUNITY): Payer: Self-pay

## 2022-02-02 ENCOUNTER — Other Ambulatory Visit (HOSPITAL_COMMUNITY): Payer: Self-pay

## 2022-03-12 ENCOUNTER — Other Ambulatory Visit (HOSPITAL_COMMUNITY): Payer: Self-pay

## 2022-04-18 ENCOUNTER — Other Ambulatory Visit (HOSPITAL_COMMUNITY): Payer: Self-pay

## 2022-04-18 ENCOUNTER — Other Ambulatory Visit: Payer: Self-pay | Admitting: Internal Medicine

## 2022-04-18 DIAGNOSIS — G40909 Epilepsy, unspecified, not intractable, without status epilepticus: Secondary | ICD-10-CM

## 2022-04-20 ENCOUNTER — Other Ambulatory Visit (HOSPITAL_COMMUNITY): Payer: Self-pay

## 2022-04-20 MED ORDER — LEVETIRACETAM ER 500 MG PO TB24
3000.0000 mg | ORAL_TABLET | Freq: Every day | ORAL | 0 refills | Status: DC
  Filled 2022-04-20: qty 180, 30d supply, fill #0

## 2022-04-20 MED ORDER — PHENYTOIN SODIUM EXTENDED 100 MG PO CAPS
300.0000 mg | ORAL_CAPSULE | Freq: Every day | ORAL | 0 refills | Status: DC
  Filled 2022-04-20: qty 90, 30d supply, fill #0

## 2022-04-20 NOTE — Telephone Encounter (Signed)
Will send in 30 days refill but she is very overdue for follow up, NEEDS to be seen

## 2022-05-23 ENCOUNTER — Other Ambulatory Visit (HOSPITAL_COMMUNITY): Payer: Self-pay

## 2022-05-23 ENCOUNTER — Other Ambulatory Visit: Payer: Self-pay | Admitting: Internal Medicine

## 2022-05-23 DIAGNOSIS — G40909 Epilepsy, unspecified, not intractable, without status epilepticus: Secondary | ICD-10-CM

## 2022-05-23 MED ORDER — LEVETIRACETAM ER 500 MG PO TB24
3000.0000 mg | ORAL_TABLET | Freq: Every day | ORAL | 11 refills | Status: DC
  Filled 2022-05-23: qty 180, 30d supply, fill #0

## 2022-05-23 MED ORDER — PHENYTOIN SODIUM EXTENDED 100 MG PO CAPS
300.0000 mg | ORAL_CAPSULE | Freq: Every day | ORAL | 11 refills | Status: DC
  Filled 2022-05-23: qty 90, 30d supply, fill #0

## 2022-05-24 ENCOUNTER — Other Ambulatory Visit: Payer: Self-pay

## 2022-05-24 ENCOUNTER — Other Ambulatory Visit (HOSPITAL_COMMUNITY): Payer: Self-pay

## 2022-05-30 ENCOUNTER — Encounter: Payer: Medicaid Other | Admitting: Internal Medicine

## 2022-06-05 ENCOUNTER — Ambulatory Visit: Payer: Medicaid Other | Admitting: Internal Medicine

## 2022-06-05 ENCOUNTER — Other Ambulatory Visit (HOSPITAL_COMMUNITY): Payer: Self-pay

## 2022-06-05 VITALS — BP 141/98 | HR 89 | Temp 98.7°F | Ht 62.0 in | Wt 101.6 lb

## 2022-06-05 DIAGNOSIS — Z0000 Encounter for general adult medical examination without abnormal findings: Secondary | ICD-10-CM

## 2022-06-05 DIAGNOSIS — R569 Unspecified convulsions: Secondary | ICD-10-CM | POA: Diagnosis present

## 2022-06-05 DIAGNOSIS — R030 Elevated blood-pressure reading, without diagnosis of hypertension: Secondary | ICD-10-CM | POA: Diagnosis not present

## 2022-06-05 DIAGNOSIS — F410 Panic disorder [episodic paroxysmal anxiety] without agoraphobia: Secondary | ICD-10-CM

## 2022-06-05 DIAGNOSIS — G40909 Epilepsy, unspecified, not intractable, without status epilepticus: Secondary | ICD-10-CM

## 2022-06-05 DIAGNOSIS — F1010 Alcohol abuse, uncomplicated: Secondary | ICD-10-CM | POA: Diagnosis not present

## 2022-06-05 DIAGNOSIS — F17210 Nicotine dependence, cigarettes, uncomplicated: Secondary | ICD-10-CM

## 2022-06-05 DIAGNOSIS — Z23 Encounter for immunization: Secondary | ICD-10-CM | POA: Diagnosis present

## 2022-06-05 DIAGNOSIS — Z720 Tobacco use: Secondary | ICD-10-CM

## 2022-06-05 DIAGNOSIS — L719 Rosacea, unspecified: Secondary | ICD-10-CM

## 2022-06-05 MED ORDER — FLUOXETINE HCL 10 MG PO CAPS
10.0000 mg | ORAL_CAPSULE | Freq: Every day | ORAL | 3 refills | Status: DC
  Filled 2022-06-05: qty 30, 30d supply, fill #0
  Filled 2022-07-19: qty 30, 30d supply, fill #1
  Filled 2022-08-24: qty 30, 30d supply, fill #2
  Filled 2022-09-26: qty 30, 30d supply, fill #3

## 2022-06-05 MED ORDER — LEVETIRACETAM ER 500 MG PO TB24
3000.0000 mg | ORAL_TABLET | Freq: Every day | ORAL | 11 refills | Status: DC
  Filled 2022-06-05 – 2022-06-19 (×2): qty 180, 30d supply, fill #0
  Filled 2022-07-19: qty 180, 30d supply, fill #1
  Filled 2022-08-24: qty 180, 30d supply, fill #2
  Filled 2022-09-26: qty 180, 30d supply, fill #3
  Filled 2022-11-05: qty 180, 30d supply, fill #4
  Filled 2022-12-03: qty 180, 30d supply, fill #5
  Filled 2023-01-07: qty 180, 30d supply, fill #6
  Filled 2023-02-06: qty 180, 30d supply, fill #7
  Filled 2023-03-11: qty 180, 30d supply, fill #8
  Filled 2023-04-12: qty 180, 30d supply, fill #9
  Filled 2023-05-14: qty 180, 30d supply, fill #10

## 2022-06-05 MED ORDER — HYDROXYZINE HCL 10 MG PO TABS
10.0000 mg | ORAL_TABLET | Freq: Three times a day (TID) | ORAL | 0 refills | Status: DC | PRN
  Filled 2022-06-05: qty 30, 10d supply, fill #0

## 2022-06-05 MED ORDER — PHENYTOIN SODIUM EXTENDED 100 MG PO CAPS
300.0000 mg | ORAL_CAPSULE | Freq: Every day | ORAL | 11 refills | Status: DC
  Filled 2022-06-05 – 2022-06-19 (×2): qty 90, 30d supply, fill #0
  Filled 2022-07-19: qty 90, 30d supply, fill #1
  Filled 2022-08-24: qty 90, 30d supply, fill #2
  Filled 2022-09-26: qty 90, 30d supply, fill #3
  Filled 2022-11-05: qty 90, 30d supply, fill #4
  Filled 2022-12-03: qty 90, 30d supply, fill #5
  Filled 2023-01-07: qty 90, 30d supply, fill #6
  Filled 2023-02-06: qty 90, 30d supply, fill #7

## 2022-06-05 NOTE — Progress Notes (Unsigned)
Subjective:  CC: seizures, panic attacks  HPI:  [name] is a 42 y.o. female with a past medical history of TBI with subarachnoid hemorrhage in [id number] complicated by seizures and alcohol use disorder who presents today for follow-up on seizures and continued difficulty with panic attacks.  She was last seen in clinic July 2023.  Since then she has obtained insurance but has not followed-up to establish care with neurology. Please see problem based assessment and plan for additional details.  Past Medical History:  Diagnosis Date   Alcohol abuse    SAH (subarachnoid hemorrhage) (HCC)    Seizure disorder (HCC)    TBI (traumatic brain injury) ([place])     Medications: Levetiracetam 3000 mg nightly Phenytoin 300 mg nightly  Social History   Socioeconomic History   Marital status: Single    Spouse name: Not on file   Number of children: Not on file   Years of education: Not on file   Highest education level: Not on file  Occupational History   Not on file  Tobacco Use   Smoking status: Every Day    Packs/day: 0.25    Types: Cigarettes   Smokeless tobacco: Never  Substance and Sexual Activity   Alcohol use: Yes    Alcohol/week: 1.0 standard drink of alcohol    Types: 1 Glasses of wine per week    Comment: 1 drink per day   Drug use: Never   Sexual activity: Not on file  Other Topics Concern   Not on file  Social History Narrative   ** Merged History Encounter **       Social Determinants of Health   Financial Resource Strain: Not on file  Food Insecurity: Not on file  Transportation Needs: Not on file  Physical Activity: Not on file  Stress: Not on file  Social Connections: Not on file  Intimate Partner Violence: Not on file    Review of Systems: ROS negative except for what is noted on the assessment and plan.  Objective:   Vitals:   06/05/22 1440 06/05/22 1607  BP: (!) 136/105 (!) 141/98  Pulse: 85 89  Temp: 98.7 F (37.1 C)    TempSrc: Oral   SpO2: 100%   Weight: 101 lb 9.6 oz (46.1 kg)   Height: 5' 2"$  (1.575 m)     Physical Exam: Constitutional: well-appearing HENT: centro-facial erythema, inflamed papules and pustules on bilateral cheeks Cardiovascular: regular rate and rhythm, no m/r/g Pulmonary/Chest: normal work of breathing on room air, lungs clear to auscultation bilaterally Neurological: alert & oriented x 3, 5/5 strength in bilateral upper and lower extremities, normal gait Psych: appears nervous  Assessment & Plan:  Seizures ([place]) She has had several seizures since July.  The most recent one was about a month ago.  Her partner states that the seizure lasted greater than 10 minutes less than 15 minutes and resolved on its own.  He did not call EMS while this was happening.  When she has seizures he typically takes off from work and stays at home with her the rest of the day.  They do not have abortive medications at home.  She has been having seizures since the TBI in 2019.  She has never seen neurology outside of the hospital.  She has had several appointments with [facility] which she has missed and they will no longer see her.  Current medications include levetiracetam 3000 mg nightly and phenytoin 300 mg nightly.  She  misses doses several times weekly as she forgets to take them.  These have not been titrated and at least a year and a half.  She is drinking 2-3 drinks daily. A/P: -Continue levetiracetam 3000 mg nightly and phenytoin 300 mg nightly. -Checking levels of levetiracetam and phenytoin -Urgent referral to neurology.  From referral in July patient could only be excepted at [facility] in [place]. I talked with patient and partner about alcohol lowering seizure threshold.  I am concerned that she is drinking more than she is stating and with duration of drinking she will likely have withdrawal symptoms predisposing her to having more seizures.  I asked that she come  back in 4 weeks and to let clinic know if she is interested in discontinuing alcohol.  Rosacea Consider addressing at follow-up visit.   Tobacco use She is smoking about 5 to 6 cigarettes daily.  She is not interested in cutting down or discontinuing at this time.  Panic attack She continues to have panic attacks.  These occur 1-2 times daily.  She feels sudden onset of intense anxiety.  Episodes last about 10 to 15 minutes.  She typically lays down in the floor when they are happening as she is afraid she will have a seizure.  She drinks alcohol to help with anxiety.  She was prescribed Prozac and hydroxyzine in July, but they were not able to pick up these medications. A/P: Prozac 10 mg daily Hydroxyzine 10 mg 3 times daily as needed Referral to behavioral health with [name] Follow-up in 4 weeks  ETOH abuse She has cut down from drinking 4-5 beers daily to 2-3 beers daily.  She does not have withdrawal symptoms when she does not drink.  She uses alcohol to help with panic attacks.  She is not interested in discontinuing at this time. P: Follow-up in 4 weeks  Healthcare maintenance She received tetanus shot today.  She is interested in having COVID-vaccine as she has not received any.  I talked with her about checking at local pharmacies to see where is still giving vaccines for COVID. -Follow-up in 4 weeks, she is overdue for Pap smear.   Patient discussed with Dr. Erroll Luna Taevion Sikora, D.O. [facility]  PGY-2 Pager: [phone]  Phone: [phone] Date 06/06/2022  Time 7:26 AM

## 2022-06-05 NOTE — Patient Instructions (Addendum)
Thank you, [name] for allowing [name] to provide your care today.  Seizures I have sent in refills of your seizure medications and will call you with results from blood work today. Please got to neurology appointment as they are the experts to adjust your seizure medications.   Panic attacks Please start prozac, take this daily. Atarax is a medication similar to benadryl that you can take when you have a panic attack. I have also referred you to [name] for counseling.  Alcohol Drinking alcohol lowers your seizure threshold. I am concerned that you may have withdraw symptoms if you suddenly discontinue. If you are interested in stopping please let the clinic know as there are medications that can help with this.  I have ordered the following labs for you:  Lab Orders         Levetiracetam level         Phenytoin (Dilantin)     Referrals ordered today:   Referral Orders         Ambulatory referral to [place]         Ambulatory referral to Neurology      I have ordered the following medication/changed the following medications:   Stop the following medications: Medications Discontinued During This Encounter  Medication Reason   FLUoxetine (PROZAC) 10 MG capsule Reorder   hydrOXYzine (ATARAX) 10 MG tablet Reorder   levETIRAcetam (KEPPRA XR) 500 MG 24 hr tablet Reorder   phenytoin (DILANTIN) 100 MG ER capsule Reorder     Start the following medications: Meds ordered this encounter  Medications   FLUoxetine (PROZAC) 10 MG capsule    Sig: Take 1 capsule (10 mg total) by mouth daily.    Dispense:  30 capsule    Refill:  3    IM program   hydrOXYzine (ATARAX) 10 MG tablet    Sig: Take 1 tablet (10 mg total) by mouth 3 (three) times daily as needed.    Dispense:  30 tablet    Refill:  0    IM program   phenytoin (DILANTIN) 100 MG ER capsule    Sig: Take 3 capsules (300 mg total) by mouth at bedtime.    Dispense:  90 capsule    Refill:   11    IM Program   levETIRAcetam (KEPPRA XR) 500 MG 24 hr tablet    Sig: Take 6 tablets (3,000 mg total) by mouth at bedtime.    Dispense:  180 tablet    Refill:  11    IM Program     Follow up:  4 weeks    We look forward to seeing you next time. Please call our clinic at [phone] if you have any questions or concerns. The best time to call is Monday-Friday from 9am-4pm, but there is someone available 24/7. If after hours or the weekend, call the main hospital number and ask for the Internal Medicine Resident On-Call. If you need medication refills, please notify your pharmacy one week in advance and they will send [name] a request.   Thank you for trusting me with your care. Wishing you the best!   Christiana Fuchs, [facility] [place]

## 2022-06-06 ENCOUNTER — Encounter: Payer: Self-pay | Admitting: Internal Medicine

## 2022-06-06 DIAGNOSIS — R030 Elevated blood-pressure reading, without diagnosis of hypertension: Secondary | ICD-10-CM | POA: Insufficient documentation

## 2022-06-06 LAB — PHENYTOIN LEVEL, TOTAL: Phenytoin (Dilantin), Serum: 4.6 ug/mL — ABNORMAL LOW (ref 10.0–20.0)

## 2022-06-06 LAB — LEVETIRACETAM LEVEL: Levetiracetam Lvl: 23.7 ug/mL (ref 10.0–40.0)

## 2022-06-06 NOTE — Assessment & Plan Note (Addendum)
She continues to have panic attacks.  These occur 1-2 times daily.  She feels sudden onset of intense anxiety.  Episodes last about 10 to 15 minutes.  She typically lays down in the floor when they are happening as she is afraid she will have a seizure.  She drinks alcohol to help with anxiety.  She was prescribed Prozac and hydroxyzine in July, but they were not able to pick up these medications. A/P: Prozac 10 mg daily Hydroxyzine 10 mg 3 times daily as needed Referral to behavioral health with [name] Follow-up in 4 weeks

## 2022-06-06 NOTE — Assessment & Plan Note (Addendum)
She has had several seizures since July.  The most recent one was about a month ago.  Her partner states that the seizure lasted greater than 10 minutes less than 15 minutes and resolved on its own.  He did not call EMS while this was happening.  When she has seizures he typically takes off from work and stays at home with her the rest of the day.  They do not have abortive medications at home.  She has been having seizures since the TBI in 2019.  She has never seen neurology outside of the hospital.  She has had several appointments with [facility] which she has missed and they will no longer see her.  Current medications include levetiracetam 3000 mg nightly and phenytoin 300 mg nightly.  She misses doses several times weekly as she forgets to take them.  These have not been titrated and at least a year and a half.  She is drinking 2-3 drinks daily. A/P: -Continue levetiracetam 3000 mg nightly and phenytoin 300 mg nightly. -Checking levels of levetiracetam and phenytoin -Urgent referral to neurology.  From referral in July patient could only be excepted at [facility] in [place]. I talked with patient and partner about alcohol lowering seizure threshold.  I am concerned that she is drinking more than she is stating and with duration of drinking she will likely have withdrawal symptoms predisposing her to having more seizures.  I asked that she come back in 4 weeks and to let clinic know if she is interested in discontinuing alcohol.

## 2022-06-06 NOTE — Addendum Note (Signed)
Addended by: Edwyna Perfect on: 06/06/2022 10:23 AM   Modules accepted: Orders

## 2022-06-06 NOTE — Assessment & Plan Note (Signed)
Blood pressure elevated at 36/105.  When this was repeated remains elevated at 141/98.  She has not had high blood pressure in the past. Plan: Follow-up in 4 weeks.  If remains elevated would consider starting medications.

## 2022-06-06 NOTE — Assessment & Plan Note (Signed)
She has cut down from drinking 4-5 beers daily to 2-3 beers daily.  She does not have withdrawal symptoms when she does not drink.  She uses alcohol to help with panic attacks.  She is not interested in discontinuing at this time. P: Follow-up in 4 weeks

## 2022-06-06 NOTE — Assessment & Plan Note (Signed)
Consider addressing at follow-up visit.

## 2022-06-06 NOTE — Assessment & Plan Note (Signed)
She is smoking about 5 to 6 cigarettes daily.  She is not interested in cutting down or discontinuing at this time.

## 2022-06-06 NOTE — Assessment & Plan Note (Signed)
She received tetanus shot today.  She is interested in having COVID-vaccine as she has not received any.  I talked with her about checking at local pharmacies to see where is still giving vaccines for COVID. -Follow-up in 4 weeks, she is overdue for Pap smear.

## 2022-06-07 NOTE — Addendum Note (Signed)
Addended by: Charise Killian on: 06/07/2022 10:45 AM   Modules accepted: Level of Service

## 2022-06-07 NOTE — Progress Notes (Signed)
Internal Medicine Clinic Attending  Case discussed with Dr. Howie Ill  At the time of the visit.  We reviewed the resident's history and exam and pertinent patient test results.  I agree with the assessment, diagnosis, and plan of care documented in the resident's note. Discussed hospital admission for alcohol detoxification and observed withdrawal given complex history when patient is ready.

## 2022-06-08 ENCOUNTER — Encounter: Payer: Self-pay | Admitting: Internal Medicine

## 2022-06-19 ENCOUNTER — Institutional Professional Consult (permissible substitution): Payer: Medicaid Other | Admitting: Licensed Clinical Social Worker

## 2022-06-19 ENCOUNTER — Other Ambulatory Visit (HOSPITAL_COMMUNITY): Payer: Self-pay

## 2022-06-26 ENCOUNTER — Ambulatory Visit (INDEPENDENT_AMBULATORY_CARE_PROVIDER_SITE_OTHER): Payer: Medicaid Other | Admitting: Licensed Clinical Social Worker

## 2022-06-26 DIAGNOSIS — F410 Panic disorder [episodic paroxysmal anxiety] without agoraphobia: Secondary | ICD-10-CM

## 2022-07-10 ENCOUNTER — Encounter: Payer: Medicaid Other | Admitting: Internal Medicine

## 2022-07-10 NOTE — Progress Notes (Deleted)
   CC: 4 week f/u  HPI:Ms.Amaya Vangorder is a 42 y.o. female who presents for evaluation of ***. Please see individual problem based A/P for details.  TBI, SAH complicat4d by seizure and alcohol use disorder, panic attacks  Sz - following with neurology?   Depression, PHQ-9: Based on the patients  [place] Visit from 11/08/2021 in [place]  PHQ-9 Total Score 11      score we have ***.  Past Medical History:  Diagnosis Date   Alcohol abuse    SAH (subarachnoid hemorrhage) (HCC)    Seizure disorder (HCC)    TBI (traumatic brain injury) ([place])    Review of Systems:   ROS   Physical Exam: There were no vitals filed for this visit.   General: *** HEENT: Conjunctiva nl , antiicteric sclerae, moist mucous membranes, no exudate or erythema Cardiovascular: Normal rate, regular rhythm.  No murmurs, rubs, or gallops Pulmonary : Equal breath sounds, No wheezes, rales, or rhonchi Abdominal: soft, nontender,  bowel sounds present Ext: No edema in lower extremities, no tenderness to palpation of lower extremities.   Assessment & Plan:   See Encounters Tab for problem based charting.  Patient {GC/GE:[id number]::"discussed with","seen with"} Dr. QH:[id number]. Hoffman","Guilloud","Mullen","Narendra","Raines","Vincent","Williams"}

## 2022-07-19 ENCOUNTER — Other Ambulatory Visit (HOSPITAL_COMMUNITY): Payer: Self-pay

## 2022-08-24 ENCOUNTER — Other Ambulatory Visit (HOSPITAL_COMMUNITY): Payer: Self-pay

## 2022-09-06 NOTE — BH Specialist Note (Signed)
Integrated Behavioral Health via Telemedicine Visit  06/26/2022 Alyssa Little [id number]  Number of Integrated Behavioral Health Clinician visits: 1- Initial Visit  Session Start time: 1530   Session End time: 1600  Total time in minutes: 30   Referring Provider: Dickie La, MD Patient/Family location: [facility] Provider location: [facility] All persons participating in visit: [facility] and Patient  Types of Service: Telephone visit and Introduction only  I connected with Alyssa Little via  Telephone or Video Enabled Telemedicine Application  (Video is Caregility application) and verified that I am speaking with the correct person using two identifiers. Discussed confidentiality: Yes   I discussed the limitations of telemedicine and the availability of in person appointments.  Discussed there is a possibility of technology failure and discussed alternative modes of communication if that failure occurs.  I discussed that engaging in this telemedicine visit, they consent to the provision of behavioral healthcare.  Patient and/or legal guardian expressed understanding and consented to Telemedicine visit: Yes   Assessment:  BHC introduced self to patient on today.Marland Kitchen [facility] gave patient [facility] contact information. Patient will contact office and schedule next appointment when convenient for patient.      Plan: Follow up with behavioral health clinician on : Patient will contact office and schedule follow up appointment.    I discussed the assessment and treatment plan with the patient and/or parent/guardian. They were provided an opportunity to ask questions and all were answered. They agreed with the plan and demonstrated an understanding of the instructions.   They were advised to call back or seek an in-person evaluation if the symptoms worsen or if the condition fails to improve as anticipated.  Christen Butter, MSW, LCSW-A She/Her Behavioral Health Clinician [facility]   Internal Medicine Center Direct Dial:[phone]  Fax [phone] Main Office Phone: [phone] [address] [place] [place], [place] [address] Website: [facility] Internal Medicine [facility] [place]  [place], [place]  

## 2022-09-19 ENCOUNTER — Telehealth: Payer: Self-pay | Admitting: *Deleted

## 2022-09-19 ENCOUNTER — Other Ambulatory Visit: Payer: Self-pay

## 2022-09-19 ENCOUNTER — Emergency Department (HOSPITAL_COMMUNITY)
Admission: EM | Admit: 2022-09-19 | Discharge: 2022-09-19 | Disposition: A | Discharge status: Home / Self Care | Payer: Medicaid Other | Attending: Emergency Medicine | Admitting: Emergency Medicine

## 2022-09-19 ENCOUNTER — Encounter (HOSPITAL_COMMUNITY): Payer: Self-pay

## 2022-09-19 ENCOUNTER — Emergency Department (HOSPITAL_COMMUNITY): Payer: Medicaid Other

## 2022-09-19 DIAGNOSIS — R109 Unspecified abdominal pain: Secondary | ICD-10-CM | POA: Diagnosis not present

## 2022-09-19 DIAGNOSIS — E860 Dehydration: Secondary | ICD-10-CM | POA: Diagnosis not present

## 2022-09-19 DIAGNOSIS — E876 Hypokalemia: Secondary | ICD-10-CM

## 2022-09-19 DIAGNOSIS — F1020 Alcohol dependence, uncomplicated: Secondary | ICD-10-CM | POA: Diagnosis not present

## 2022-09-19 DIAGNOSIS — R112 Nausea with vomiting, unspecified: Secondary | ICD-10-CM | POA: Diagnosis not present

## 2022-09-19 DIAGNOSIS — R569 Unspecified convulsions: Secondary | ICD-10-CM | POA: Diagnosis present

## 2022-09-19 DIAGNOSIS — F1090 Alcohol use, unspecified, uncomplicated: Secondary | ICD-10-CM

## 2022-09-19 LAB — COMPREHENSIVE METABOLIC PANEL
ALT: 37 U/L (ref 0–44)
AST: 62 U/L — ABNORMAL HIGH (ref 15–41)
Albumin: 2.7 g/dL — ABNORMAL LOW (ref 3.5–5.0)
Alkaline Phosphatase: 102 U/L (ref 38–126)
Anion gap: 17 — ABNORMAL HIGH (ref 5–15)
BUN: 6 mg/dL (ref 6–20)
CO2: 18 mmol/L — ABNORMAL LOW (ref 22–32)
Calcium: 7.8 mg/dL — ABNORMAL LOW (ref 8.9–10.3)
Chloride: 101 mmol/L (ref 98–111)
Creatinine, Ser: 0.62 mg/dL (ref 0.44–1.00)
GFR, Estimated: >60 mL/min (ref >60)
Glucose, Bld: 151 mg/dL — ABNORMAL HIGH (ref 70–99)
Potassium: 3.1 mmol/L — ABNORMAL LOW (ref 3.5–5.1)
Sodium: 136 mmol/L (ref 135–145)
Total Bilirubin: 0.6 mg/dL (ref 0.3–1.2)
Total Protein: 6 g/dL — ABNORMAL LOW (ref 6.5–8.1)

## 2022-09-19 LAB — LIPASE, BLOOD: Lipase: 21 U/L (ref 11–51)

## 2022-09-19 LAB — URINALYSIS, ROUTINE W REFLEX MICROSCOPIC
Bilirubin Urine: NEGATIVE
Glucose, UA: NEGATIVE mg/dL
Ketones, ur: 20 mg/dL — AB
Leukocytes,Ua: NEGATIVE
Nitrite: NEGATIVE
Protein, ur: NEGATIVE mg/dL
Specific Gravity, Urine: 1.018 (ref 1.005–1.030)
pH: 5 (ref 5.0–8.0)

## 2022-09-19 LAB — CBC
HCT: 37.5 % (ref 36.0–46.0)
Hemoglobin: 12.3 g/dL (ref 12.0–15.0)
MCH: 33.5 pg (ref 26.0–34.0)
MCHC: 32.8 g/dL (ref 30.0–36.0)
MCV: 102.2 fL — ABNORMAL HIGH (ref 80.0–100.0)
Platelets: 308 10*3/uL (ref 150–400)
RBC: 3.67 MIL/uL — ABNORMAL LOW (ref 3.87–5.11)
RDW: 13 % (ref 11.5–15.5)
WBC: 24.2 10*3/uL — ABNORMAL HIGH (ref 4.0–10.5)
nRBC: 0 % (ref 0.0–0.2)

## 2022-09-19 LAB — MAGNESIUM: Magnesium: 1.5 mg/dL — ABNORMAL LOW (ref 1.7–2.4)

## 2022-09-19 LAB — PREGNANCY, URINE: Preg Test, Ur: NEGATIVE

## 2022-09-19 LAB — PHENYTOIN LEVEL, TOTAL: Phenytoin Lvl: <2.5 ug/mL — ABNORMAL LOW (ref 10.0–20.0)

## 2022-09-19 MED ORDER — DIAZEPAM 5 MG/ML IJ SOLN
2.5000 mg | Freq: Once | INTRAMUSCULAR | Status: AC
  Administered 2022-09-19: 2.5 mg via INTRAVENOUS
  Filled 2022-09-19: qty 2

## 2022-09-19 MED ORDER — POTASSIUM CHLORIDE CRYS ER 20 MEQ PO TBCR
EXTENDED_RELEASE_TABLET | ORAL | 0 refills | Status: DC
  Filled 2022-09-19: qty 15, 12d supply, fill #0

## 2022-09-19 MED ORDER — LACTATED RINGERS IV BOLUS
1000.0000 mL | Freq: Once | INTRAVENOUS | Status: AC
  Administered 2022-09-19: 1000 mL via INTRAVENOUS

## 2022-09-19 MED ORDER — LEVETIRACETAM IN NACL 1500 MG/100ML IV SOLN
1500.0000 mg | Freq: Once | INTRAVENOUS | Status: AC
  Administered 2022-09-19: 1500 mg via INTRAVENOUS
  Filled 2022-09-19: qty 100

## 2022-09-19 MED ORDER — PHENYTOIN SODIUM EXTENDED 100 MG PO CAPS
300.0000 mg | ORAL_CAPSULE | Freq: Once | ORAL | Status: AC
  Administered 2022-09-19: 300 mg via ORAL
  Filled 2022-09-19: qty 3

## 2022-09-19 MED ORDER — MAGNESIUM SULFATE IN D5W 1-5 GM/100ML-% IV SOLN
1.0000 g | Freq: Once | INTRAVENOUS | Status: AC
  Administered 2022-09-19: 1 g via INTRAVENOUS
  Filled 2022-09-19: qty 100

## 2022-09-19 MED ORDER — ONDANSETRON HCL 4 MG/2ML IJ SOLN
4.0000 mg | Freq: Once | INTRAMUSCULAR | Status: AC
  Administered 2022-09-19: 4 mg via INTRAVENOUS
  Filled 2022-09-19: qty 2

## 2022-09-19 MED ORDER — IOHEXOL 300 MG/ML  SOLN
100.0000 mL | Freq: Once | INTRAMUSCULAR | Status: AC | PRN
  Administered 2022-09-19: 100 mL via INTRAVENOUS

## 2022-09-19 MED ORDER — PANTOPRAZOLE SODIUM 40 MG IV SOLR
40.0000 mg | Freq: Once | INTRAVENOUS | Status: AC
  Administered 2022-09-19: 40 mg via INTRAVENOUS
  Filled 2022-09-19: qty 10

## 2022-09-19 NOTE — Discharge Instructions (Addendum)
It was our pleasure to provide your ER care today - we hope that you feel better.  Make sure to take your seizure meds as prescribed, and not to miss or skip doses. Avoid alcohol use. Drink plenty of fluids/stay well hydrated. From today's labs, your potassium level is low - eat plenty of fruits and vegetables, take potassium supplement as prescribed, and follow up with your doctor in one week.   No driving or swimming until cleared to do so by your doctor.   Follow up with neurologist in the next 1-2 weeks.  Return to ER right away if worse, new symptoms, fevers, recurrent or persistent seizures, trouble breathing, new/severe pain, or other concern.

## 2022-09-19 NOTE — ED Notes (Signed)
Unsuccessful IV attempt x2.  

## 2022-09-19 NOTE — ED Notes (Signed)
Pt ambulated to restroom and back to bed with no difficulty

## 2022-09-19 NOTE — ED Provider Notes (Signed)
[place] EMERGENCY DEPARTMENT AT [facility] Provider Note   CSN: [id number] Arrival date & time: 09/19/22  1728     History  Chief Complaint  Patient presents with   Seizures    Alyssa Little is a 42 y.o. female.  Pt with hx etoh use disorder, seizures, presents via EMS w report of intermittent nausea and vomiting in past two days so had not been able to take her seizure meds.  Pt reportedly with seizure around 2 pm today and another around 4 pm. Ems noted cbg 148. No report of trauma/fall. No report of fevers. Pt limited historian - level 5 caveat. No dysuria. No headache.   The history is provided by the patient and medical records. The history is limited by the condition of the patient.  Seizures      Home Medications Prior to Admission medications   Medication Sig Start Date End Date Taking? Authorizing Provider  FLUoxetine (PROZAC) 10 MG capsule Take 1 capsule (10 mg total) by mouth daily. 06/05/22  Yes Masters, Katie, DO  hydrOXYzine (ATARAX) 10 MG tablet Take 1 tablet (10 mg total) by mouth 3 (three) times daily as needed. Patient taking differently: Take 10 mg by mouth 3 (three) times daily as needed for anxiety. 06/05/22  Yes Masters, Katie, DO  levETIRAcetam (KEPPRA XR) 500 MG 24 hr tablet Take 6 tablets (3,000 mg total) by mouth at bedtime. 06/05/22  Yes Masters, Katie, DO  MIDOL COMPLETE 500-60-15 MG TABS Take 1-2 tablets by mouth every 6 (six) hours as needed (for cramping).   Yes [provider]  phenytoin (DILANTIN) 100 MG ER capsule Take 3 capsules (300 mg total) by mouth at bedtime. 06/05/22  Yes Masters, Katie, DO  potassium chloride SA (KLOR-CON M) 20 MEQ tablet One po bid x 3 days, then one po once a day 09/19/22  Yes Cathren Laine, MD  TYLENOL 500 MG tablet Take 500-1,000 mg by mouth every 6 (six) hours as needed for mild pain (or headaches).   Yes [provider]  Multiple Vitamin (MULTIVITAMIN WITH MINERALS) TABS tablet  Take 1 tablet by mouth daily. Patient not taking: Reported on 09/19/2022 03/11/18   Charlton Amor, PA-C      Allergies    Shellfish allergy, Flagyl [metronidazole], and Penicillins    Review of Systems   Review of Systems  Constitutional:  Negative for fever.  HENT:  Negative for sore throat.   Eyes:  Negative for redness and visual disturbance.  Respiratory:  Negative for cough and shortness of breath.   Cardiovascular:  Negative for chest pain.  Gastrointestinal:  Positive for abdominal pain, nausea and vomiting. Negative for diarrhea.  Genitourinary:  Negative for dysuria, flank pain, vaginal bleeding and vaginal discharge.  Musculoskeletal:  Negative for back pain and neck pain.  Skin:  Negative for rash.  Neurological:  Positive for seizures.  Hematological:  Does not bruise/bleed easily.    Physical Exam Updated Vital Signs BP 115/87   Pulse (!) 110   Temp 99.1 F (37.3 C)   Resp 19   SpO2 99%  Physical Exam Vitals and nursing note reviewed.  Constitutional:      Appearance: Normal appearance. She is well-developed.  HENT:     Head: Atraumatic.     Nose: Nose normal.     Mouth/Throat:     Mouth: Mucous membranes are moist.  Eyes:     General: No scleral icterus.    Conjunctiva/sclera: Conjunctivae normal.  Pupils: Pupils are equal, round, and reactive to light.  Neck:     Vascular: No carotid bruit.     Trachea: No tracheal deviation.     Comments: No stiffness or rigidity.  Cardiovascular:     Rate and Rhythm: Normal rate and regular rhythm.     Pulses: Normal pulses.     Heart sounds: Normal heart sounds. No murmur heard.    No friction rub. No gallop.  Pulmonary:     Effort: Pulmonary effort is normal. No respiratory distress.     Breath sounds: Normal breath sounds.  Abdominal:     General: Bowel sounds are normal. There is no distension.     Palpations: Abdomen is soft. There is no mass.     Tenderness: There is abdominal tenderness. There is  no rebound.     Hernia: No hernia is present.  Genitourinary:    Comments: No cva tenderness.  Musculoskeletal:        General: No swelling or tenderness.     Cervical back: Normal range of motion and neck supple. No rigidity or tenderness. No muscular tenderness.     Comments: CTLS spine, non tender, aligned, no step off. Good rom bilateral extremities without pain or focal bony tenderness.   Skin:    General: Skin is warm and dry.     Findings: No rash.  Neurological:     Mental Status: She is alert.     Comments: Alert, speech normal. Motor/sens grossly intact.   Psychiatric:        Mood and Affect: Mood normal.     ED Results / Procedures / Treatments   Labs (all labs ordered are listed, but only abnormal results are displayed) Results for orders placed or performed during the hospital encounter of 09/19/22  CBC  Result Value Ref Range   WBC 24.2 (H) 4.0 - 10.5 K/uL   RBC 3.67 (L) 3.87 - 5.11 MIL/uL   Hemoglobin 12.3 12.0 - 15.0 g/dL   HCT 04.5 40.9 - 81.1 %   MCV 102.2 (H) 80.0 - 100.0 fL   MCH 33.5 26.0 - 34.0 pg   MCHC 32.8 30.0 - 36.0 g/dL   RDW 91.4 78.2 - 95.6 %   Platelets 308 150 - 400 K/uL   nRBC 0.0 0.0 - 0.2 %  Urinalysis, Routine w reflex microscopic -Urine, Clean Catch  Result Value Ref Range   Color, Urine YELLOW YELLOW   APPearance CLEAR CLEAR   Specific Gravity, Urine 1.018 1.005 - 1.030   pH 5.0 5.0 - 8.0   Glucose, UA NEGATIVE NEGATIVE mg/dL   Hgb urine dipstick SMALL (A) NEGATIVE   Bilirubin Urine NEGATIVE NEGATIVE   Ketones, ur 20 (A) NEGATIVE mg/dL   Protein, ur NEGATIVE NEGATIVE mg/dL   Nitrite NEGATIVE NEGATIVE   Leukocytes,Ua NEGATIVE NEGATIVE   RBC / HPF 0-5 0 - 5 RBC/hpf   WBC, UA 0-5 0 - 5 WBC/hpf   Bacteria, UA RARE (A) NONE SEEN   Squamous Epithelial / HPF 0-5 0 - 5 /HPF   Mucus PRESENT    Hyaline Casts, UA PRESENT   Pregnancy, urine  Result Value Ref Range   Preg Test, Ur NEGATIVE NEGATIVE  Phenytoin level, total  Result  Value Ref Range   Phenytoin Lvl <2.5 (L) 10.0 - 20.0 ug/mL  Comprehensive metabolic panel  Result Value Ref Range   Sodium 136 135 - 145 mmol/L   Potassium 3.1 (L) 3.5 - 5.1 mmol/L   Chloride 101  98 - 111 mmol/L   CO2 18 (L) 22 - 32 mmol/L   Glucose, Bld 151 (H) 70 - 99 mg/dL   BUN 6 6 - 20 mg/dL   Creatinine, Ser 1.61 0.44 - 1.00 mg/dL   Calcium 7.8 (L) 8.9 - 10.3 mg/dL   Total Protein 6.0 (L) 6.5 - 8.1 g/dL   Albumin 2.7 (L) 3.5 - 5.0 g/dL   AST 62 (H) 15 - 41 U/L   ALT 37 0 - 44 U/L   Alkaline Phosphatase 102 38 - 126 U/L   Total Bilirubin 0.6 0.3 - 1.2 mg/dL   GFR, Estimated >09 >60 mL/min   Anion gap 17 (H) 5 - 15  Lipase, blood  Result Value Ref Range   Lipase 21 11 - 51 U/L  Magnesium  Result Value Ref Range   Magnesium 1.5 (L) 1.7 - 2.4 mg/dL     EKG EKG Interpretation  Date/Time:  Wednesday September 19 2022 17:48:28 EDT Ventricular Rate:  127 PR Interval:  122 QRS Duration: 76 QT Interval:  288 QTC Calculation: 419 R Axis:   37 Text Interpretation: Sinus tachycardia Confirmed by Cathren Laine ([id number]) on 09/19/2022 5:55:34 PM  Radiology CT Head Wo Contrast  Result Date: 09/19/2022 CLINICAL DATA:  Mental status change of unknown cause. Recurrent seizures. Nausea and vomiting for 2 days. EXAM: CT HEAD WITHOUT CONTRAST TECHNIQUE: Contiguous axial images were obtained from the base of the skull through the vertex without intravenous contrast. RADIATION DOSE REDUCTION: This exam was performed according to the departmental dose-optimization program which includes automated exposure control, adjustment of the mA and/or kV according to patient size and/or use of iterative reconstruction technique. COMPARISON:  02/20/2021 FINDINGS: Brain: Focal low-attenuation changes in the subcortical white matter of the anterior frontal lobes bilaterally. This is unchanged since prior study. Etiology is nonspecific but could indicate old posttraumatic change. No mass-effect or midline shift. No  abnormal extra-axial fluid collections. No ventricular dilatation. Gray-white matter junctions are distinct. Basal cisterns are not effaced. No acute intracranial hemorrhage. Vascular: No hyperdense vessel or unexpected calcification. Skull: Normal. Negative for fracture or focal lesion. Sinuses/Orbits: No acute finding. Other: None. IMPRESSION: 1. Low-attenuation changes in the anterior frontal lobes bilaterally, unchanged since prior study. 2. No acute intracranial abnormalities. Electronically Signed   By: Burman Nieves M.D.   On: 09/19/2022 20:58   CT ABDOMEN PELVIS W CONTRAST  Result Date: 09/19/2022 CLINICAL DATA:  Acute nonlocalized abdominal pain. Nausea and vomiting for 2 days. Seizures. EXAM: CT ABDOMEN AND PELVIS WITH CONTRAST TECHNIQUE: Multidetector CT imaging of the abdomen and pelvis was performed using the standard protocol following bolus administration of intravenous contrast. RADIATION DOSE REDUCTION: This exam was performed according to the departmental dose-optimization program which includes automated exposure control, adjustment of the mA and/or kV according to patient size and/or use of iterative reconstruction technique. CONTRAST:  OMNIPAQUE IOHEXOL 300 MG/ML  SOLN COMPARISON:  11/07/2020 FINDINGS: Lower chest: Lung bases are clear. Hepatobiliary: Diffuse fatty infiltration of the liver. No focal lesions. Gallbladder and bile ducts are normal. Pancreas: Unremarkable. No pancreatic ductal dilatation or surrounding inflammatory changes. Spleen: Normal in size without focal abnormality. Adrenals/Urinary Tract: Adrenal glands are unremarkable. Kidneys are normal, without renal calculi, focal lesion, or hydronephrosis. Bladder is unremarkable. Stomach/Bowel: Stomach, small bowel, and colon are not abnormally distended. There is mild thickening of the gastric pyloric wall, possibly due to normal peristalsis but could indicate gastritis or peptic ulcer disease. Small bowel and colon are  decompressed.  No small bowel wall thickening. Scattered colonic diverticula without evidence of acute diverticulitis. Appendix is not identified. Vascular/Lymphatic: No significant vascular findings are present. No enlarged abdominal or pelvic lymph nodes. Reproductive: Uterus and bilateral adnexa are unremarkable. Other: No abdominal wall hernia or abnormality. No abdominopelvic ascites. Musculoskeletal: No acute or significant osseous findings. IMPRESSION: 1. Diffuse fatty infiltration of the liver. 2. Possible thickening of the gastric pylorus may be due to peristalsis or gastritis/peptic ulcer disease. No evidence of bowel obstruction. Electronically Signed   By: Burman Nieves M.D.   On: 09/19/2022 20:55    Procedures Procedures    Medications Ordered in ED Medications  magnesium sulfate IVPB 1 g 100 mL (1 g Intravenous New Bag/Given 09/19/22 2228)  phenytoin (DILANTIN) ER capsule 300 mg (has no administration in time range)  levETIRAcetam (KEPPRA) IVPB 1500 mg/ 100 mL premix (0 mg Intravenous Stopped 09/19/22 1825)  lactated ringers bolus 1,000 mL (0 mLs Intravenous Stopped 09/19/22 1953)  ondansetron (ZOFRAN) injection 4 mg (4 mg Intravenous Given 09/19/22 1905)  diazepam (VALIUM) injection 2.5 mg (2.5 mg Intravenous Given 09/19/22 1902)  iohexol (OMNIPAQUE) 300 MG/ML solution 100 mL (100 mLs Intravenous Contrast Given 09/19/22 2042)  pantoprazole (PROTONIX) injection 40 mg (40 mg Intravenous Given 09/19/22 2210)    ED Course/ Medical Decision Making/ A&P                             Medical Decision Making Problems Addressed: Alcohol use disorder: chronic illness or injury that poses a threat to life or bodily functions    Details: History of Dehydration: acute illness or injury with systemic symptoms that poses a threat to life or bodily functions Hypokalemia: acute illness or injury Hypomagnesemia: acute illness or injury Seizures (HCC): acute illness or injury with systemic symptoms that  poses a threat to life or bodily functions  Amount and/or Complexity of Data Reviewed Independent Historian: EMS    Details: EMS/friend, hx External Data Reviewed: notes. Labs: ordered. Decision-making details documented in ED Course. Radiology: ordered and independent interpretation performed. Decision-making details documented in ED Course. ECG/medicine tests: ordered and independent interpretation performed. Decision-making details documented in ED Course.  Risk Prescription drug management. Decision regarding hospitalization.   Iv ns. Continuous pulse ox and cardiac monitoring. Labs ordered/sent. Imaging ordered.   Differential diagnosis includes seizures, subtherapeutic sz meds, dehydration, aki, etc. Dispo decision including potential need for admission considered - will get labs and imaging and reassess.   Reviewed nursing notes and prior charts for additional history. External reports reviewed. Additional history from: EMS.   Cardiac monitor: sinus rhythm, rate 80.  Pt not given seizure meds by EMS. Keppra 1500 mg iv.   Labs reviewed/interpreted by me - wbc high. Denies fever, chills, sweats or source of infection. ?whether if from recent seizures. K low. Mg low. Mg iv. Kcl po.   CT reviewed/interpreted by me - no hem. No acute abd process.  Po fluids/food. Ambulates in hall.   Pt alert and awake. No current c/o. No nv. Hr 90. Rr 16. Pulse ox 97% room air.  Pt/signif other indicate has adequate of her meds at home. Confirms is on both dilantin and keppra.   Pt currently appears stable for d/c.  Rec pcp/neurology close outpatient f/u.  Return precautions provided.            Final Clinical Impression(s) / ED Diagnoses Final diagnoses:  Seizures (HCC)  Alcohol use disorder  Hypokalemia  Dehydration  Hypomagnesemia    Rx / DC Orders ED Discharge Orders          Ordered    potassium chloride SA (KLOR-CON M) 20 MEQ tablet        09/19/22 2251               Cathren Laine, MD 09/19/22 2252

## 2022-09-19 NOTE — ED Notes (Signed)
Pt unable to provide urine specimen at this time

## 2022-09-19 NOTE — ED Triage Notes (Signed)
BIBA from home- N/V for 2 days, hasn't been able to take seizure medications. Per EMS, pt had a seizure at 1400, 1600, and immediately PTA. Pt is post ictal at this time, no injuries noted. 140/88 BP 150 HR 148 cbg 99% room air Zofran 4mg  and 500 cc NS given

## 2022-09-19 NOTE — ED Notes (Signed)
Alyssa Little, arrived at bedside to take patient home.

## 2022-09-19 NOTE — Telephone Encounter (Signed)
Call from pt's SO,Peter, who stated pt had a seizure around 2PM. She has been  vomiting, unable to keep anything down. He stated she has been taking her meds but she did not last night. Offered 1515PM appt for this afternoon but pt does not feel like coming in. No available appts tomorrow. He stated pt has some anti-nausea chewable tablets; informed if she can keep down. He wants to know if the doctor could recommend or prescribe something for the nausea - I told him I will inform her doctor. Also I advised going to UC or ED; stated he will wait a couple hours to see how she feels.

## 2022-09-20 ENCOUNTER — Other Ambulatory Visit (HOSPITAL_COMMUNITY): Payer: Self-pay

## 2022-09-20 LAB — LEVETIRACETAM LEVEL: Levetiracetam Lvl: <2 ug/mL — ABNORMAL LOW (ref 10.0–40.0)

## 2022-09-26 ENCOUNTER — Other Ambulatory Visit (HOSPITAL_COMMUNITY): Payer: Self-pay

## 2022-10-02 ENCOUNTER — Other Ambulatory Visit (HOSPITAL_COMMUNITY): Payer: Self-pay

## 2022-10-08 ENCOUNTER — Encounter: Payer: Medicaid Other | Admitting: Internal Medicine

## 2022-10-17 ENCOUNTER — Ambulatory Visit: Payer: Medicaid Other | Admitting: Internal Medicine

## 2022-10-17 ENCOUNTER — Encounter: Payer: Self-pay | Admitting: Internal Medicine

## 2022-10-17 VITALS — BP 115/94 | HR 110 | Ht 62.0 in | Wt 95.7 lb

## 2022-10-17 DIAGNOSIS — R569 Unspecified convulsions: Secondary | ICD-10-CM

## 2022-10-17 DIAGNOSIS — R634 Abnormal weight loss: Secondary | ICD-10-CM

## 2022-10-17 DIAGNOSIS — F17210 Nicotine dependence, cigarettes, uncomplicated: Secondary | ICD-10-CM

## 2022-10-17 DIAGNOSIS — F410 Panic disorder [episodic paroxysmal anxiety] without agoraphobia: Secondary | ICD-10-CM

## 2022-10-17 DIAGNOSIS — L719 Rosacea, unspecified: Secondary | ICD-10-CM | POA: Diagnosis not present

## 2022-10-17 DIAGNOSIS — E876 Hypokalemia: Secondary | ICD-10-CM

## 2022-10-17 DIAGNOSIS — Z720 Tobacco use: Secondary | ICD-10-CM

## 2022-10-17 DIAGNOSIS — F1010 Alcohol abuse, uncomplicated: Secondary | ICD-10-CM

## 2022-10-17 LAB — BASIC METABOLIC PANEL
Anion gap: 17 — ABNORMAL HIGH (ref 5–15)
BUN: 5 mg/dL — ABNORMAL LOW (ref 6–20)
CO2: 24 mmol/L (ref 22–32)
Calcium: 8.5 mg/dL — ABNORMAL LOW (ref 8.9–10.3)
Chloride: 96 mmol/L — ABNORMAL LOW (ref 98–111)
Creatinine, Ser: 0.52 mg/dL (ref 0.44–1.00)
GFR, Estimated: >60 mL/min (ref >60)
Glucose, Bld: 95 mg/dL (ref 70–99)
Potassium: 4.1 mmol/L (ref 3.5–5.1)
Sodium: 137 mmol/L (ref 135–145)

## 2022-10-17 NOTE — Progress Notes (Signed)
Subjective:  CC: decreased appetite  HPI:  [name] is a 42 y.o. female with a past medical history stated below and presents today for decreased appetite since starting prozac in February.  Since last office visit she also presented to ED due to multiple seizures in setting of vomiting that prohibited her from taking seizure medications.  She was given IV seizure medication and observed. Without additional episodes she was discharged with recommendations for close follow-up. Please see problem based assessment and plan for additional details.  Past Medical History:  Diagnosis Date   Alcohol abuse    SAH (subarachnoid hemorrhage) (HCC)    Seizure disorder (HCC)    TBI (traumatic brain injury) (HCC)     Current Outpatient Medications on File Prior to Visit  Medication Sig Dispense Refill   hydrOXYzine (ATARAX) 10 MG tablet Take 1 tablet (10 mg total) by mouth 3 (three) times daily as needed. (Patient taking differently: Take 10 mg by mouth 3 (three) times daily as needed for anxiety.) 30 tablet 0   levETIRAcetam (KEPPRA XR) 500 MG 24 hr tablet Take 6 tablets (3,000 mg total) by mouth at bedtime. 180 tablet 11   MIDOL COMPLETE 500-60-15 MG TABS Take 1-2 tablets by mouth every 6 (six) hours as needed (for cramping).     Multiple Vitamin (MULTIVITAMIN WITH MINERALS) TABS tablet Take 1 tablet by mouth daily. (Patient not taking: Reported on 09/19/2022)     phenytoin (DILANTIN) 100 MG ER capsule Take 3 capsules (300 mg total) by mouth at bedtime. 90 capsule 11   TYLENOL 500 MG tablet Take 500-1,000 mg by mouth every 6 (six) hours as needed for mild pain (or headaches).     No current facility-administered medications on file prior to visit.    Family History  Family history unknown: Yes    Social History   Socioeconomic History   Marital status: Single    Spouse name: Not on file   Number of children: Not on file   Years of education: Not on file   Highest  education level: Not on file  Occupational History   Not on file  Tobacco Use   Smoking status: Every Day    Packs/day: .25    Types: Cigarettes   Smokeless tobacco: Never  Substance and Sexual Activity   Alcohol use: Yes    Alcohol/week: 1.0 standard drink of alcohol    Types: 1 Glasses of wine per week    Comment: 1 drink per day   Drug use: Never   Sexual activity: Not on file  Other Topics Concern   Not on file  Social History Narrative   ** Merged History Encounter **       Social Determinants of Health   Financial Resource Strain: Not on file  Food Insecurity: Not on file  Transportation Needs: Not on file  Physical Activity: Not on file  Stress: Not on file  Social Connections: Not on file  Intimate Partner Violence: Not on file    Review of Systems: ROS negative except for what is noted on the assessment and plan.  Objective:   Vitals:   10/17/22 1453  BP: (!) 115/94  Pulse: (!) 110  SpO2: 96%  Weight: 95 lb 11.2 oz (43.4 kg)  Height: 5\' 2"  (1.575 m)    Physical Exam: Constitutional: chronically ill-appearing HENT: inflammed papules and pustules on chin, centrofacial erythema, rhinophyma Cardiovascular: regular rate and rhythm, no m/r/g Pulmonary/Chest: normal work of breathing on room  air, lungs clear to auscultation bilaterally Abdominal: soft, non-tender, non-distended MSK: normal bulk and tone Neurological: alert & oriented x 3, normal gait Skin: warm and dry Psych: anxious   Assessment & Plan:  Seizures (HCC) She presents for follow-up. Since last visit she continues to have break through seizures.  She had a few between February and June when she was taking all seizure medications.  In early June she had nausea and vomiting so could not keep medications down. These lead to 2 seizures in one day. She has been having seizures since TBI in 2019, but she had never established care with neurology outside of hospital due to insurance difficulties  and trouble with getting to appointments. Her significant other helps her with adherence to current medications. Phenytoin and keppra levels low at recent ED visit. She does have an appointment with [facility]rology on 08/07 at 2 pm. P: Continue keppra 3000mg  every day and phenytoin 300 mg at bedtime To establish care with neuro 08/07  Tobacco use She stopped smoking about a month ago without using nicotine replacement.  ETOH abuse She continues to drink about 3 beers daily. She is not interested in cessation at this time.  Panic attack Since starting prozac in February the amount of panic attacks have decreased. She used atarax about 2 times but did not note improvement in symptoms. She does feel that new medication has made her more sleepy and decreased appetite. She's lost about 5 lbs unintentionally since 2/24. She would like to stop prozac and see how she does. P: Prozac every other day for next week, then discontinue F/u in 1 month  Weight loss Unintentional 5 lb weight loss in last 5 months. Will try discontinuing prozac to see if this is what is decreasing appetite, but this would be unusual side effect. -CTM, f/u in 1 month  Hypokalemia K at 3.1 in recent ED visit. She was given PO supplementation and completed this. Will recheck BMP today. -BMP with K wnl  Rosacea Exam notable for rosacea changes. She know has insurance and would benefit from dermatology referral. Consider addressing at follow-up.  Patient discussed with Dr. Marjorie Smolder Alyssa Little, D.O. [facility]  PGY-3 Pager: [phone]  Phone: [phone] Date 10/19/2022  Time 7:24 AM

## 2022-10-17 NOTE — Patient Instructions (Addendum)
Thank you, Alyssa Little for allowing Alyssa Little to provide your care today.   Seizures- be sure to follow-up with neurology on 08/07 at [facility].  Your med levels were not high when they recently checked in ED. I am concerned that alcohol use is increasing your seizure risk. If you want to stop drinking I would recommend admitting you into the hospital for close observation. [name], MD Neurology NPI: [id number] [address] [place] [address]   Phone: [phone] Fax: [phone]  Low potassium I am checking BMP and will call with results.  Panic attacks: For the next week take a 1/2 tablet of prozac daily and then stop medication. I have removed from your medication list.  I have ordered the following labs for you:  Lab Orders         BMP w Anion Gap (STAT/Sunquest-performed on-site)      I have ordered the following medication/changed the following medications:   Stop the following medications: Medications Discontinued During This Encounter  Medication Reason   potassium chloride SA (KLOR-CON M) 20 MEQ tablet    FLUoxetine (PROZAC) 10 MG capsule      Start the following medications: No orders of the defined types were placed in this encounter.    Follow up:  4 weeks  We look forward to seeing you next time. Please call our clinic at [phone] if you have any questions or concerns. The best time to call is Monday-Friday from 9am-4pm, but there is someone available 24/7. If after hours or the weekend, call the main hospital number and ask for the Internal Medicine Resident On-Call. If you need medication refills, please notify your pharmacy one week in advance and they will send Alyssa Little a request.   Thank you for trusting me with your care. Wishing you the best!   Rudene Christians, DO [facility]

## 2022-10-19 DIAGNOSIS — E876 Hypokalemia: Secondary | ICD-10-CM | POA: Insufficient documentation

## 2022-10-19 DIAGNOSIS — R634 Abnormal weight loss: Secondary | ICD-10-CM | POA: Insufficient documentation

## 2022-10-19 HISTORY — DX: Hypokalemia: E87.6

## 2022-10-19 NOTE — Assessment & Plan Note (Signed)
K at 3.1 in recent ED visit. She was given PO supplementation and completed this. Will recheck BMP today. -BMP with K wnl

## 2022-10-19 NOTE — Assessment & Plan Note (Signed)
Exam notable for rosacea changes. She know has insurance and would benefit from dermatology referral. Consider addressing at follow-up.

## 2022-10-19 NOTE — Assessment & Plan Note (Signed)
She presents for follow-up. Since last visit she continues to have break through seizures.  She had a few between February and June when she was taking all seizure medications.  In early June she had nausea and vomiting so could not keep medications down. These lead to 2 seizures in one day. She has been having seizures since TBI in 2019, but she had never established care with neurology outside of hospital due to insurance difficulties and trouble with getting to appointments. Her significant other helps her with adherence to current medications. Phenytoin and keppra levels low at recent ED visit. She does have an appointment with [facility]rology on 08/07 at 2 pm. P: Continue keppra 3000mg  every day and phenytoin 300 mg at bedtime To establish care with neuro 08/07

## 2022-10-19 NOTE — Assessment & Plan Note (Signed)
Unintentional 5 lb weight loss in last 5 months. Will try discontinuing prozac to see if this is what is decreasing appetite, but this would be unusual side effect. -CTM, f/u in 1 month

## 2022-10-19 NOTE — Assessment & Plan Note (Signed)
She continues to drink about 3 beers daily. She is not interested in cessation at this time.

## 2022-10-19 NOTE — Assessment & Plan Note (Signed)
She stopped smoking about a month ago without using nicotine replacement.

## 2022-10-19 NOTE — Assessment & Plan Note (Signed)
Since starting prozac in February the amount of panic attacks have decreased. She used atarax about 2 times but did not note improvement in symptoms. She does feel that new medication has made her more sleepy and decreased appetite. She's lost about 5 lbs unintentionally since 2/24. She would like to stop prozac and see how she does. P: Prozac every other day for next week, then discontinue F/u in 1 month

## 2022-10-23 NOTE — Progress Notes (Signed)
Internal Medicine Clinic Attending  Case discussed with the resident at the time of the visit.  We reviewed the resident's history and exam and pertinent patient test results.  I agree with the assessment, diagnosis, and plan of care documented in the resident's note.  

## 2022-11-05 ENCOUNTER — Other Ambulatory Visit (HOSPITAL_COMMUNITY): Payer: Self-pay

## 2022-11-21 ENCOUNTER — Other Ambulatory Visit (HOSPITAL_COMMUNITY): Payer: Self-pay

## 2022-11-21 MED ORDER — DIVALPROEX SODIUM ER 500 MG PO TB24
500.0000 mg | ORAL_TABLET | Freq: Two times a day (BID) | ORAL | 5 refills | Status: DC
  Filled 2022-11-21: qty 60, 30d supply, fill #0

## 2022-12-03 ENCOUNTER — Other Ambulatory Visit (HOSPITAL_COMMUNITY): Payer: Self-pay

## 2023-01-07 ENCOUNTER — Other Ambulatory Visit (HOSPITAL_COMMUNITY): Payer: Self-pay

## 2023-02-04 ENCOUNTER — Ambulatory Visit: Payer: Medicaid Other | Admitting: Internal Medicine

## 2023-02-04 ENCOUNTER — Encounter: Payer: Self-pay | Admitting: Internal Medicine

## 2023-02-04 VITALS — BP 126/93 | HR 79 | Temp 97.9°F | Wt 100.8 lb

## 2023-02-04 DIAGNOSIS — F410 Panic disorder [episodic paroxysmal anxiety] without agoraphobia: Secondary | ICD-10-CM

## 2023-02-04 DIAGNOSIS — G40909 Epilepsy, unspecified, not intractable, without status epilepticus: Secondary | ICD-10-CM

## 2023-02-04 DIAGNOSIS — R569 Unspecified convulsions: Secondary | ICD-10-CM

## 2023-02-04 DIAGNOSIS — D7589 Other specified diseases of blood and blood-forming organs: Secondary | ICD-10-CM | POA: Diagnosis not present

## 2023-02-04 NOTE — Patient Instructions (Signed)
Thank you, Ms.Chad Salvesen for allowing [name] to provide your care today.   Seizures Please call neurologist today to get follow-up. This practice is not trained to take care of seizures. Continue taking keppra and dilantin for now.  Panic attacks I have referred you to psychiatry. Please call insurance to find out about coverage for this.  Blood count I am rechecking your vitamin levels today as they were a bit off in June.   I have ordered the following labs for you:  Lab Orders         CBC with Diff         Vitamin B12         Folate      Follow up:  1 month    We look forward to seeing you next time. Please call our clinic at [phone] if you have any questions or concerns. The best time to call is Monday-Friday from 9am-4pm, but there is someone available 24/7. If after hours or the weekend, call the main hospital number and ask for the Internal Medicine Resident On-Call. If you need medication refills, please notify your pharmacy one week in advance and they will send [name] a request.   Thank you for trusting me with your care. Wishing you the best!   Rudene Christians, DO [facility]

## 2023-02-04 NOTE — Progress Notes (Unsigned)
Subjective:  CC: seizure, panic attacks  HPI:  [name] is a 42 y.o. female with a past medical history of SAH with TBI, AUD, panic attacks and seizures who presents for follow-up. She established care with [facility] in 03/2021 and was referred to neurology at that time. She was seen by neurology at [facility] in August of this year and medications were changed. Between then she had been taking anti-epileptics started by hospital neurologist during an admission in 10/2020.   Please see problem based assessment and plan for additional details.  Past Medical History:  Diagnosis Date   Alcohol abuse    SAH (subarachnoid hemorrhage) (HCC)    Seizure disorder (HCC)    TBI (traumatic brain injury) (HCC)     Current Outpatient Medications on File Prior to Visit  Medication Sig Dispense Refill   divalproex (DEPAKOTE ER) 500 MG 24 hr tablet Take 1 tablet (500 mg total) by mouth 2 (two) times daily. 60 tablet 5   hydrOXYzine (ATARAX) 10 MG tablet Take 1 tablet (10 mg total) by mouth 3 (three) times daily as needed. (Patient taking differently: Take 10 mg by mouth 3 (three) times daily as needed for anxiety.) 30 tablet 0   levETIRAcetam (KEPPRA XR) 500 MG 24 hr tablet Take 6 tablets (3,000 mg total) by mouth at bedtime. 180 tablet 11   MIDOL COMPLETE 500-60-15 MG TABS Take 1-2 tablets by mouth every 6 (six) hours as needed (for cramping).     Multiple Vitamin (MULTIVITAMIN WITH MINERALS) TABS tablet Take 1 tablet by mouth daily. (Patient not taking: Reported on 09/19/2022)     phenytoin (DILANTIN) 100 MG ER capsule Take 3 capsules (300 mg total) by mouth at bedtime. 90 capsule 11   TYLENOL 500 MG tablet Take 500-1,000 mg by mouth every 6 (six) hours as needed for mild pain (or headaches).     No current facility-administered medications on file prior to visit.    Family History  Family history unknown: Yes    Social History   Socioeconomic History   Marital status: Single     Spouse name: Not on file   Number of children: Not on file   Years of education: Not on file   Highest education level: Not on file  Occupational History   Not on file  Tobacco Use   Smoking status: Some Days    Current packs/day: 0.25    Types: Cigarettes    Passive exposure: Current   Smokeless tobacco: Never   Tobacco comments:    Quit smoking around 8 mths ago (feb 2024) , spouse still smokes and will smoke one of his from time to time  Substance and Sexual Activity   Alcohol use: Yes    Alcohol/week: 1.0 standard drink of alcohol    Types: 1 Glasses of wine per week    Comment: 1 drink per day   Drug use: Never   Sexual activity: Not on file  Other Topics Concern   Not on file  Social History Narrative   ** Merged History Encounter **       Social Determinants of Health   Financial Resource Strain: Medium Risk (11/21/2022)   Received from [facility]   Overall Financial Resource Strain (CARDIA)    Difficulty of Paying Living Expenses: Somewhat hard  Food Insecurity: No Food Insecurity (11/21/2022)   Received from [facility]   Hunger Vital Sign    Worried About Running Out of Food in the Last Year:  Never true    Ran Out of Food in the Last Year: Never true  Transportation Needs: No Transportation Needs (11/21/2022)   Received from [facility]    Lack of Transportation (Medical): No    Lack of Transportation (Non-Medical): No  Physical Activity: Not on file  Stress: Not on file  Social Connections: Unknown (08/27/2022)   Received from [facility], [facility]   Social Network    Social Network: Not on file  Intimate Partner Violence: Unknown (08/27/2022)   Received from [facility], [facility]   HITS    Physically Hurt: Not on file    Insult or Talk Down To: Not on file    Threaten Physical Harm: Not on file    Scream or Curse: Not on file    Review of Systems: ROS negative except for what is noted on the assessment and  plan.  Objective:   Vitals:   02/04/23 1416 02/04/23 1454  BP: (!) 121/91 (!) 126/93  Pulse: 87 79  Temp: 97.9 F (36.6 C)   TempSrc: Oral   SpO2: 100%   Weight: 100 lb 12.8 oz (45.7 kg)     Physical Exam: Constitutional: defers answering questions to partner Cardiovascular: regular rate and rhythm, no m/r/g Pulmonary/Chest: normal work of breathing on room air, lungs clear to auscultation bilaterally MSK: thin Neurological: alert & oriented x 3 Skin: warm and dry  Assessment & Plan:  Seizures (HCC) Since July she does not think that she has had any seizures. She typically has seizure at night and her partner tells her they happened the next morning. She continues to drink 4-5 beers daily, she stays at home and drinks for something to do. When she went to neurologist in August, they stopped phenytoin, continued keppra, and started depakote to help with mood stabilization.  She reports that the visit felt rushed and she would want to see a different neurologist. She took one dose of depakote and felt that it made her out of it. She has continued taking phenytoin and keppra. Phenytoin was stopped due to concerns for long term side effects.  A/P: I asked Theron Arista to call [facility] today to get follow-up as soon as possible. He states she has follow-up in Dec and that he had talked with a friend who is only on Keppra and asked if I thought that would be ok. I repeated that this clinic does not manage seizures and that she needed to establish with a neurologist. Could consider placing another referral if they are not able to see a different neurologist within practice but [facility] will not see them as they missed to many appointments prior to her getting insurance.   Panic attack She continue to have 2-3 panic attacks weekly which is improved from daily a few months ago. She titrated off of prozac this summer and feels that her appetite has improved. She would be open to going to psychiatry but is  concerned about cost. She has been referred to [place], [facility] therapist but tele visit were not completed and patient did not answer phone. Lindita reports that she does not have a cell phone or house phone that works and all calls go through [place] who likely would not have answered a call from someone he didn't know.  P: Referral to psychiatry, I asked that they call their insurance to ask about co-pays.  Macrocytosis without anemia CBC with macrocytosis of 104 without anemia. B12 and folate within normal  limits. Macrocytosis most likely from alcohol consumption. Synthetic function of liver appears intact with platelets normal. -plan to recheck cbc in 6 months (4/25)    Patient discussed with Dr. Precious Bard Caryl Fate, D.O. [facility]  PGY-3 Pager: [phone]  Phone: [phone] Date 02/05/2023  Time 1:56 PM

## 2023-02-05 DIAGNOSIS — D7589 Other specified diseases of blood and blood-forming organs: Secondary | ICD-10-CM | POA: Insufficient documentation

## 2023-02-05 LAB — CBC WITH DIFFERENTIAL/PLATELET
Basophils Absolute: 0.1 10*3/uL (ref 0.0–0.2)
Basos: 1 %
EOS (ABSOLUTE): 0.1 10*3/uL (ref 0.0–0.4)
Eos: 1 %
Hematocrit: 40 % (ref 34.0–46.6)
Hemoglobin: 13.2 g/dL (ref 11.1–15.9)
Immature Grans (Abs): 0 10*3/uL (ref 0.0–0.1)
Immature Granulocytes: 0 %
Lymphocytes Absolute: 2 10*3/uL (ref 0.7–3.1)
Lymphs: 29 %
MCH: 34.4 pg — ABNORMAL HIGH (ref 26.6–33.0)
MCHC: 33 g/dL (ref 31.5–35.7)
MCV: 104 fL — ABNORMAL HIGH (ref 79–97)
Monocytes Absolute: 0.8 10*3/uL (ref 0.1–0.9)
Monocytes: 11 %
Neutrophils Absolute: 4 10*3/uL (ref 1.4–7.0)
Neutrophils: 58 %
Platelets: 337 10*3/uL (ref 150–450)
RBC: 3.84 x10E6/uL (ref 3.77–5.28)
RDW: 12.3 % (ref 11.7–15.4)
WBC: 7 10*3/uL (ref 3.4–10.8)

## 2023-02-05 LAB — FOLATE: Folate: 4.3 ng/mL (ref >3.0)

## 2023-02-05 LAB — VITAMIN B12: Vitamin B-12: 449 pg/mL (ref 232–1245)

## 2023-02-05 NOTE — Assessment & Plan Note (Signed)
Since July she does not think that she has had any seizures. She typically has seizure at night and her partner tells her they happened the next morning. She continues to drink 4-5 beers daily, she stays at home and drinks for something to do. When she went to neurologist in August, they stopped phenytoin, continued keppra, and started depakote to help with mood stabilization.  She reports that the visit felt rushed and she would want to see a different neurologist. She took one dose of depakote and felt that it made her out of it. She has continued taking phenytoin and keppra. Phenytoin was stopped due to concerns for long term side effects.  A/P: I asked Theron Arista to call [facility] today to get follow-up as soon as possible. He states she has follow-up in Dec and that he had talked with a friend who is only on Keppra and asked if I thought that would be ok. I repeated that this clinic does not manage seizures and that she needed to establish with a neurologist. Could consider placing another referral if they are not able to see a different neurologist within practice but [facility] will not see them as they missed to many appointments prior to her getting insurance.  -received message from pharmacy about generic phenytoin being discontinued, ok with switching to different generic but ultimately patient needs to be seen by neurologist to be placed on better medication.

## 2023-02-05 NOTE — Assessment & Plan Note (Signed)
CBC with macrocytosis of 104 without anemia. B12 and folate within normal limits. Macrocytosis most likely from alcohol consumption. Synthetic function of liver appears intact with platelets normal. -plan to recheck cbc in 6 months (4/25)

## 2023-02-05 NOTE — Assessment & Plan Note (Signed)
She continue to have 2-3 panic attacks weekly which is improved from daily a few months ago. She titrated off of prozac this summer and feels that her appetite has improved. She would be open to going to psychiatry but is concerned about cost. She has been referred to [place], [facility] therapist but tele visit were not completed and patient did not answer phone. Alyssa Little reports that she does not have a cell phone or house phone that works and all calls go through [name] who likely would not have answered a call from someone he didn't know.  P: Referral to psychiatry, I asked that they call their insurance to ask about co-pays.

## 2023-02-06 ENCOUNTER — Other Ambulatory Visit (HOSPITAL_COMMUNITY): Payer: Self-pay

## 2023-02-07 ENCOUNTER — Other Ambulatory Visit: Payer: Self-pay

## 2023-02-07 ENCOUNTER — Other Ambulatory Visit (HOSPITAL_COMMUNITY): Payer: Self-pay

## 2023-02-07 MED ORDER — PHENYTOIN SODIUM EXTENDED 100 MG PO CAPS
300.0000 mg | ORAL_CAPSULE | Freq: Every day | ORAL | 3 refills | Status: DC
  Filled 2023-02-07: qty 90, 30d supply, fill #0
  Filled 2023-03-11: qty 90, 30d supply, fill #1
  Filled 2023-04-15: qty 90, 30d supply, fill #2

## 2023-02-07 NOTE — Addendum Note (Signed)
Addended by: Lucille Passy on: 02/07/2023 01:27 PM   Modules accepted: Orders

## 2023-02-08 NOTE — Progress Notes (Signed)
Internal Medicine Clinic Attending  Case discussed with the resident at the time of the visit.  We reviewed the resident's history and exam and pertinent patient test results.  I agree with the assessment, diagnosis, and plan of care documented in the resident's note.  

## 2023-02-08 NOTE — Addendum Note (Signed)
Addended by: Dickie La on: 02/08/2023 08:51 AM   Modules accepted: Level of Service

## 2023-03-08 ENCOUNTER — Encounter: Payer: Medicaid Other | Admitting: Student

## 2023-03-11 ENCOUNTER — Other Ambulatory Visit (HOSPITAL_COMMUNITY): Payer: Self-pay

## 2023-03-28 ENCOUNTER — Encounter: Payer: Medicaid Other | Admitting: Student

## 2023-04-11 ENCOUNTER — Encounter: Payer: Self-pay | Admitting: Internal Medicine

## 2023-04-11 NOTE — Progress Notes (Unsigned)
   CC: follow up  HPI:  [name] is a 42 y.o. with medical history of alcohol use disorder, seizure disorder and hx of panic attacks presenting to [facility] for a follow up. Last seen in 01/2023 and instructed to follow up in 4 weeks. At last visit, was told to follow up with neurologist for seizure medication management. She had elevated blood pressure at last visit that needed follow up and some concern for unintentional weight loss.   Please see problem-based list for further details, assessments, and plans.  Past Medical History:  Diagnosis Date   Alcohol abuse    Hypokalemia 10/19/2022   SAH (subarachnoid hemorrhage) (HCC)    Seizure disorder (HCC)    TBI (traumatic brain injury) (HCC)    Transaminitis        Current Outpatient Medications (Analgesics):    MIDOL COMPLETE 500-60-15 MG TABS, Take 1-2 tablets by mouth every 6 (six) hours as needed (for cramping).   TYLENOL 500 MG tablet, Take 500-1,000 mg by mouth every 6 (six) hours as needed for mild pain (or headaches).   Current Outpatient Medications (Other):    divalproex (DEPAKOTE ER) 500 MG 24 hr tablet, Take 1 tablet (500 mg total) by mouth 2 (two) times daily.   hydrOXYzine (ATARAX) 10 MG tablet, Take 1 tablet (10 mg total) by mouth 3 (three) times daily as needed. (Patient taking differently: Take 10 mg by mouth 3 (three) times daily as needed for anxiety.)   levETIRAcetam (KEPPRA XR) 500 MG 24 hr tablet, Take 6 tablets (3,000 mg total) by mouth at bedtime.   Multiple Vitamin (MULTIVITAMIN WITH MINERALS) TABS tablet, Take 1 tablet by mouth daily. (Patient not taking: Reported on 09/19/2022)   phenytoin (DILANTIN) 100 MG ER capsule, Take 3 capsules (300 mg total) by mouth at bedtime.  Review of Systems:  Review of system negative unless stated in the problem list or HPI.    Physical Exam:  Vitals:   04/12/23 0848  BP: 125/87  Pulse: 97  Temp: 98.2 F (36.8 C)  TempSrc: Oral  SpO2: 100%  Weight: 102  lb 11.2 oz (46.6 kg)  Height: 5\' 2"  (1.575 m)   Physical Exam General: NAD HENT: NCAT Lungs: CTAB, no wheeze, rhonchi or rales.  Cardiovascular: Normal heart sounds, no r/m/g, 2+ pulses in all extremities. No LE edema Abdomen: No TTP, normal bowel sounds MSK: No asymmetry or muscle atrophy.  Skin: no lesions noted on exposed skin Neuro: Alert and oriented x4. CN grossly intact Psych: Normal mood and normal affect   Assessment & Plan:   No problem-specific Assessment & Plan notes found for this encounter.   See Encounters Tab for problem based charting.  Patient Discussed with Dr. {NAMES:[id number]::"Guilloud","Hoffman","Mullen","Narendra","Vincent","Machen","Lau","Hatcher","Williams"} Gwenevere Abbot, MD Eligha Bridegroom. [facility], PGY-3  Seizure disorder Continue keppra 3000 mg every day. Wean off dilantin.   Elevated BP   Panic Disorder: no recent attack.    Weight loss: 100 lb last visit.   Rosacea: Dermatology referral. Topical medications.   Folic acid: 4.3. start Folic acid supplement

## 2023-04-12 ENCOUNTER — Emergency Department (HOSPITAL_COMMUNITY): Payer: Medicaid Other

## 2023-04-12 ENCOUNTER — Ambulatory Visit: Payer: Medicaid Other | Admitting: Internal Medicine

## 2023-04-12 ENCOUNTER — Other Ambulatory Visit: Payer: Self-pay

## 2023-04-12 ENCOUNTER — Observation Stay (HOSPITAL_COMMUNITY): Payer: Medicaid Other

## 2023-04-12 ENCOUNTER — Telehealth: Payer: Self-pay

## 2023-04-12 ENCOUNTER — Inpatient Hospital Stay (HOSPITAL_COMMUNITY)
Admission: EM | Admit: 2023-04-12 | Discharge: 2023-04-18 | DRG: 101 | Disposition: A | Discharge status: Home / Self Care | Payer: Medicaid Other | Attending: Internal Medicine | Admitting: Internal Medicine

## 2023-04-12 ENCOUNTER — Other Ambulatory Visit (HOSPITAL_COMMUNITY): Payer: Self-pay

## 2023-04-12 ENCOUNTER — Encounter (HOSPITAL_COMMUNITY): Payer: Medicaid Other

## 2023-04-12 VITALS — BP 125/87 | HR 97 | Temp 98.2°F | Ht 62.0 in | Wt 102.7 lb

## 2023-04-12 DIAGNOSIS — H5509 Other forms of nystagmus: Secondary | ICD-10-CM | POA: Diagnosis present

## 2023-04-12 DIAGNOSIS — D509 Iron deficiency anemia, unspecified: Secondary | ICD-10-CM | POA: Diagnosis present

## 2023-04-12 DIAGNOSIS — R569 Unspecified convulsions: Secondary | ICD-10-CM

## 2023-04-12 DIAGNOSIS — Y900 Blood alcohol level of less than 20 mg/100 ml: Secondary | ICD-10-CM | POA: Diagnosis present

## 2023-04-12 DIAGNOSIS — G9340 Encephalopathy, unspecified: Secondary | ICD-10-CM | POA: Diagnosis present

## 2023-04-12 DIAGNOSIS — E871 Hypo-osmolality and hyponatremia: Secondary | ICD-10-CM | POA: Diagnosis not present

## 2023-04-12 DIAGNOSIS — F419 Anxiety disorder, unspecified: Secondary | ICD-10-CM | POA: Diagnosis present

## 2023-04-12 DIAGNOSIS — Z720 Tobacco use: Secondary | ICD-10-CM | POA: Diagnosis not present

## 2023-04-12 DIAGNOSIS — Z87820 Personal history of traumatic brain injury: Secondary | ICD-10-CM | POA: Diagnosis present

## 2023-04-12 DIAGNOSIS — F32A Depression, unspecified: Secondary | ICD-10-CM | POA: Diagnosis not present

## 2023-04-12 DIAGNOSIS — S069XAA Unspecified intracranial injury with loss of consciousness status unknown, initial encounter: Secondary | ICD-10-CM | POA: Diagnosis present

## 2023-04-12 DIAGNOSIS — L719 Rosacea, unspecified: Secondary | ICD-10-CM | POA: Diagnosis not present

## 2023-04-12 DIAGNOSIS — G40909 Epilepsy, unspecified, not intractable, without status epilepticus: Principal | ICD-10-CM

## 2023-04-12 DIAGNOSIS — D7589 Other specified diseases of blood and blood-forming organs: Secondary | ICD-10-CM | POA: Diagnosis present

## 2023-04-12 DIAGNOSIS — R030 Elevated blood-pressure reading, without diagnosis of hypertension: Secondary | ICD-10-CM | POA: Diagnosis not present

## 2023-04-12 DIAGNOSIS — E440 Moderate protein-calorie malnutrition: Secondary | ICD-10-CM | POA: Diagnosis present

## 2023-04-12 DIAGNOSIS — F17210 Nicotine dependence, cigarettes, uncomplicated: Secondary | ICD-10-CM | POA: Diagnosis present

## 2023-04-12 DIAGNOSIS — F068 Other specified mental disorders due to known physiological condition: Secondary | ICD-10-CM | POA: Diagnosis present

## 2023-04-12 DIAGNOSIS — R634 Abnormal weight loss: Secondary | ICD-10-CM

## 2023-04-12 DIAGNOSIS — F23 Brief psychotic disorder: Secondary | ICD-10-CM | POA: Diagnosis not present

## 2023-04-12 DIAGNOSIS — Z91199 Patient's noncompliance with other medical treatment and regimen due to unspecified reason: Secondary | ICD-10-CM | POA: Diagnosis not present

## 2023-04-12 DIAGNOSIS — F10139 Alcohol abuse with withdrawal, unspecified: Secondary | ICD-10-CM | POA: Diagnosis present

## 2023-04-12 DIAGNOSIS — G9389 Other specified disorders of brain: Secondary | ICD-10-CM | POA: Diagnosis not present

## 2023-04-12 DIAGNOSIS — Z883 Allergy status to other anti-infective agents status: Secondary | ICD-10-CM

## 2023-04-12 DIAGNOSIS — L258 Unspecified contact dermatitis due to other agents: Secondary | ICD-10-CM | POA: Diagnosis present

## 2023-04-12 DIAGNOSIS — F1090 Alcohol use, unspecified, uncomplicated: Secondary | ICD-10-CM

## 2023-04-12 DIAGNOSIS — Z880 Allergy status to penicillin: Secondary | ICD-10-CM

## 2023-04-12 DIAGNOSIS — E512 Wernicke's encephalopathy: Secondary | ICD-10-CM | POA: Diagnosis present

## 2023-04-12 DIAGNOSIS — I10 Essential (primary) hypertension: Secondary | ICD-10-CM | POA: Diagnosis not present

## 2023-04-12 DIAGNOSIS — F410 Panic disorder [episodic paroxysmal anxiety] without agoraphobia: Secondary | ICD-10-CM

## 2023-04-12 DIAGNOSIS — R233 Spontaneous ecchymoses: Secondary | ICD-10-CM | POA: Diagnosis present

## 2023-04-12 DIAGNOSIS — K760 Fatty (change of) liver, not elsewhere classified: Secondary | ICD-10-CM | POA: Diagnosis present

## 2023-04-12 DIAGNOSIS — R4701 Aphasia: Secondary | ICD-10-CM | POA: Diagnosis present

## 2023-04-12 DIAGNOSIS — Z79899 Other long term (current) drug therapy: Secondary | ICD-10-CM

## 2023-04-12 DIAGNOSIS — E876 Hypokalemia: Secondary | ICD-10-CM | POA: Diagnosis not present

## 2023-04-12 DIAGNOSIS — Z91013 Allergy to seafood: Secondary | ICD-10-CM

## 2023-04-12 DIAGNOSIS — R4182 Altered mental status, unspecified: Secondary | ICD-10-CM | POA: Diagnosis not present

## 2023-04-12 DIAGNOSIS — E46 Unspecified protein-calorie malnutrition: Secondary | ICD-10-CM | POA: Diagnosis present

## 2023-04-12 LAB — CBC WITH DIFFERENTIAL/PLATELET
Abs Immature Granulocytes: 0.01 10*3/uL (ref 0.00–0.07)
Basophils Absolute: 0.1 10*3/uL (ref 0.0–0.1)
Basophils Relative: 1 %
Eosinophils Absolute: 0 10*3/uL (ref 0.0–0.5)
Eosinophils Relative: 0 %
HCT: 40.5 % (ref 36.0–46.0)
Hemoglobin: 13.9 g/dL (ref 12.0–15.0)
Immature Granulocytes: 0 %
Lymphocytes Relative: 17 %
Lymphs Abs: 1.3 10*3/uL (ref 0.7–4.0)
MCH: 35.5 pg — ABNORMAL HIGH (ref 26.0–34.0)
MCHC: 34.3 g/dL (ref 30.0–36.0)
MCV: 103.3 fL — ABNORMAL HIGH (ref 80.0–100.0)
Monocytes Absolute: 0.6 10*3/uL (ref 0.1–1.0)
Monocytes Relative: 8 %
Neutro Abs: 5.5 10*3/uL (ref 1.7–7.7)
Neutrophils Relative %: 74 %
Platelets: 318 10*3/uL (ref 150–400)
RBC: 3.92 MIL/uL (ref 3.87–5.11)
RDW: 12.8 % (ref 11.5–15.5)
WBC: 7.4 10*3/uL (ref 4.0–10.5)
nRBC: 0 % (ref 0.0–0.2)

## 2023-04-12 LAB — COMPREHENSIVE METABOLIC PANEL
ALT: 62 U/L — ABNORMAL HIGH (ref 0–44)
AST: 91 U/L — ABNORMAL HIGH (ref 15–41)
Albumin: 3.6 g/dL (ref 3.5–5.0)
Alkaline Phosphatase: 110 U/L (ref 38–126)
Anion gap: 10 (ref 5–15)
BUN: <5 mg/dL — ABNORMAL LOW (ref 6–20)
CO2: 20 mmol/L — ABNORMAL LOW (ref 22–32)
Calcium: 8.3 mg/dL — ABNORMAL LOW (ref 8.9–10.3)
Chloride: 98 mmol/L (ref 98–111)
Creatinine, Ser: 0.52 mg/dL (ref 0.44–1.00)
GFR, Estimated: >60 mL/min (ref >60)
Glucose, Bld: 116 mg/dL — ABNORMAL HIGH (ref 70–99)
Potassium: 3.7 mmol/L (ref 3.5–5.1)
Sodium: 128 mmol/L — ABNORMAL LOW (ref 135–145)
Total Bilirubin: 1.1 mg/dL (ref <1.2)
Total Protein: 7.5 g/dL (ref 6.5–8.1)

## 2023-04-12 LAB — PHENYTOIN LEVEL, TOTAL: Phenytoin Lvl: <2.5 ug/mL — ABNORMAL LOW (ref 10.0–20.0)

## 2023-04-12 LAB — ETHANOL: Alcohol, Ethyl (B): <10 mg/dL (ref <10)

## 2023-04-12 LAB — CBG MONITORING, ED: Glucose-Capillary: 98 mg/dL (ref 70–99)

## 2023-04-12 MED ORDER — THIAMINE HCL 100 MG/ML IJ SOLN
250.0000 mg | Freq: Two times a day (BID) | INTRAVENOUS | Status: DC
  Administered 2023-04-14: 250 mg via INTRAVENOUS
  Filled 2023-04-12 (×3): qty 2.5

## 2023-04-12 MED ORDER — LORAZEPAM 2 MG/ML IJ SOLN: 1.0000 mg | Freq: Once | INTRAMUSCULAR | Status: DC

## 2023-04-12 MED ORDER — AZELAIC ACID 15 % EX GEL
1.0000 | Freq: Two times a day (BID) | CUTANEOUS | 0 refills | Status: AC
  Filled 2023-04-12: qty 50, 30d supply, fill #0

## 2023-04-12 MED ORDER — LORAZEPAM 1 MG PO TABS
1.0000 mg | ORAL_TABLET | ORAL | Status: AC | PRN
  Filled 2023-04-12: qty 1

## 2023-04-12 MED ORDER — AZELAIC ACID 20 % EX CREA
TOPICAL_CREAM | Freq: Two times a day (BID) | CUTANEOUS | 0 refills | Status: DC
  Filled 2023-04-12: qty 50, 30d supply, fill #0

## 2023-04-12 MED ORDER — LORAZEPAM 2 MG/ML IJ SOLN
INTRAMUSCULAR | Status: AC
  Filled 2023-04-12: qty 1

## 2023-04-12 MED ORDER — FOLIC ACID 1 MG PO TABS
1.0000 mg | ORAL_TABLET | Freq: Every day | ORAL | Status: DC
  Administered 2023-04-13 – 2023-04-18 (×6): 1 mg via ORAL
  Filled 2023-04-12 (×6): qty 1

## 2023-04-12 MED ORDER — ACETAMINOPHEN 325 MG PO TABS: 650.0000 mg | ORAL_TABLET | Freq: Four times a day (QID) | ORAL | Status: DC | PRN

## 2023-04-12 MED ORDER — THIAMINE HCL 100 MG/ML IJ SOLN
500.0000 mg | Freq: Three times a day (TID) | INTRAVENOUS | Status: AC
  Administered 2023-04-12 – 2023-04-14 (×6): 500 mg via INTRAVENOUS
  Filled 2023-04-12 (×6): qty 5

## 2023-04-12 MED ORDER — THIAMINE MONONITRATE 100 MG PO TABS: 100.0000 mg | ORAL_TABLET | Freq: Every day | ORAL | Status: DC

## 2023-04-12 MED ORDER — LORAZEPAM 2 MG/ML IJ SOLN
1.0000 mg | INTRAMUSCULAR | Status: AC | PRN
  Administered 2023-04-12 – 2023-04-14 (×3): 2 mg via INTRAVENOUS
  Filled 2023-04-12 (×3): qty 1

## 2023-04-12 MED ORDER — ADULT MULTIVITAMIN W/MINERALS CH
1.0000 | ORAL_TABLET | Freq: Every day | ORAL | Status: DC
  Administered 2023-04-12 – 2023-04-18 (×7): 1 via ORAL
  Filled 2023-04-12 (×7): qty 1

## 2023-04-12 MED ORDER — THIAMINE HCL 100 MG/ML IJ SOLN: 100.0000 mg | Freq: Every day | INTRAMUSCULAR | Status: DC

## 2023-04-12 MED ORDER — FOLIC ACID 1 MG PO TABS
1.0000 mg | ORAL_TABLET | Freq: Every day | ORAL | 2 refills | Status: DC
  Filled 2023-04-12: qty 30, 30d supply, fill #0
  Filled 2023-05-14: qty 30, 30d supply, fill #1
  Filled 2023-06-12: qty 30, 30d supply, fill #2

## 2023-04-12 MED ORDER — ACETAMINOPHEN 650 MG RE SUPP: 650.0000 mg | Freq: Four times a day (QID) | RECTAL | Status: DC | PRN

## 2023-04-12 MED ORDER — HYDROXYZINE HCL 10 MG PO TABS: 10.0000 mg | ORAL_TABLET | Freq: Three times a day (TID) | ORAL | Status: AC | PRN

## 2023-04-12 MED ORDER — LEVETIRACETAM IN NACL 1000 MG/100ML IV SOLN
1000.0000 mg | Freq: Once | INTRAVENOUS | Status: AC
  Administered 2023-04-12: 1000 mg via INTRAVENOUS
  Filled 2023-04-12: qty 100

## 2023-04-12 MED ORDER — LORAZEPAM 2 MG/ML IJ SOLN
1.0000 mg | Freq: Once | INTRAMUSCULAR | Status: AC
  Administered 2023-04-12: 1 mg via INTRAVENOUS
  Filled 2023-04-12: qty 1

## 2023-04-12 MED ORDER — LACTULOSE ENEMA
300.0000 mL | Freq: Once | ORAL | Status: DC
  Filled 2023-04-12: qty 300

## 2023-04-12 MED ORDER — ENOXAPARIN SODIUM 40 MG/0.4ML IJ SOSY
40.0000 mg | PREFILLED_SYRINGE | INTRAMUSCULAR | Status: DC
Start: 2023-04-12 — End: 2023-04-18
  Administered 2023-04-13 – 2023-04-17 (×6): 40 mg via SUBCUTANEOUS
  Filled 2023-04-12 (×6): qty 0.4

## 2023-04-12 NOTE — Procedures (Signed)
Patient Name: Alyssa Little  MRN: [id number]  Epilepsy Attending: Charlsie Quest  Referring Physician/Provider: Dr Caryl Pina Date: 04/12/2023 Duration: 24.13 mins  Patient history: 42yo F with ams getting eeg to evaluate for seizure  Level of alertness: Awake  AEDs during EEG study: Ativan  Technical aspects: This EEG study was done with scalp electrodes positioned according to the 10-20 International system of electrode placement. Electrical activity was reviewed with band pass filter of 1-70Hz , sensitivity of 7 uV/mm, display speed of 58mm/sec with a 60Hz  notched filter applied as appropriate. EEG data were recorded continuously and digitally stored.  Video monitoring was available and reviewed as appropriate.  Description: EEG showed continuous generalized 3 to 6 Hz theta-delta slowing. Hyperventilation and photic stimulation were not performed.     Of note, eeg was technically difficult due to significant myogenic artifact.  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 No seizures or epileptiform discharges were seen throughout the recording.  [name] Annabelle Harman

## 2023-04-12 NOTE — Consult Note (Signed)
NEUROLOGY CONSULT NOTE   Date of service: April 12, 2023 Patient Name: Alyssa Little MRN:  [id number] DOB:  10/27/1980 Chief Complaint: "Extremely confused" Requesting Provider: Pricilla Loveless, MD  History of Present Illness  Alyssa Little is a 42 y.o. female who has a past medical history of Alcohol abuse, Hypokalemia (10/19/2022), SAH (subarachnoid hemorrhage) (HCC), Seizure disorder (HCC), TBI (traumatic brain injury) (HCC), and Transaminitis., who presented from home to the [facility] this afternoon for evaluation after her husband found her to be extremely confused at 1300. On arrival to the ED she was struggling to follow commands and had inappropriate words. She was felt to be postictal from an unwitnessed seizure at home.   CT head revealed no acute abnormalities. Encephalomalacia was seen in the high anterior frontal lobes bilaterally, unchanged from the prior CT head dated September 19, 2022. Marland Kitchen  She has a longstanding history of seizures. She is currently being switched over to Keppra from Dilantin, the latter being tapered.   Per EDP's exam at the [facility] ED: "She will follow some commands but not others. When asked to squeeze hand she lifts her arms above her head. Bilateral strength appears intact. When asked to trial finger-nose, she uses the contralateral hand."   EDP consulted Dr. Amada Jupiter by telephone who recommended Keppra load and transfer to [facility] for LTM EEG. Keppra 1000 mg IV was loaded in the ED, but her "very atypical behaviors" continued.  Home medications list includes phenytoin ER 300 mg at bedtime, but in the context of her taper it is unclear if she is on a lower dose or now completely off this medication. Keppra XR 3000 mg at bedtime is listed on her home medications list as well.     ROS  Unable to obtain due to aphasia.   Past History   Past Medical History:  Diagnosis Date   Alcohol abuse    Hypokalemia 10/19/2022   SAH (subarachnoid  hemorrhage) (HCC)    Seizure disorder (HCC)    TBI (traumatic brain injury) (HCC)    Transaminitis     Past Surgical History:  Procedure Laterality Date   CHOLECYSTECTOMY      Family History: Family History  Family history unknown: Yes    Social History  reports that she has been smoking cigarettes. She has been exposed to tobacco smoke. She has never used smokeless tobacco. She reports current alcohol use of about 1.0 standard drink of alcohol per week. She reports that she does not use drugs.  Allergies  Allergen Reactions   Shellfish Allergy Anaphylaxis   Flagyl [Metronidazole] Hives   Penicillins Hives, Rash and Other (See Comments)    Tolerated cephalosporins including ancef  Has patient had a PCN reaction causing immediate rash, facial/tongue/throat swelling, SOB or lightheadedness with hypotension: Unknown Has patient had a PCN reaction causing severe rash involving mucus membranes or skin necrosis: Unknown Has patient had a PCN reaction that required hospitalization: Unknown Has patient had a PCN reaction occurring within the last 10 years: Unknown If all of the above answers are "NO", then may    Medications  No current facility-administered medications for this encounter.  Current Outpatient Medications:    Azelaic Acid 15 % gel, Apply 1 Application topically 2 (two) times daily. After skin is thoroughly washed and patted dry, gently but thoroughly massage a thin film of azelaic acid cream into the affected area twice daily, in the morning and evening., Disp: 50 g, Rfl: 0   folic acid (FOLVITE)  1 MG tablet, Take 1 tablet (1 mg total) by mouth daily., Disp: 30 tablet, Rfl: 2   hydrOXYzine (ATARAX) 10 MG tablet, Take 1 tablet (10 mg total) by mouth 3 (three) times daily as needed., Disp: , Rfl:    levETIRAcetam (KEPPRA XR) 500 MG 24 hr tablet, Take 6 tablets (3,000 mg total) by mouth at bedtime., Disp: 180 tablet, Rfl: 11   Multiple Vitamin (MULTIVITAMIN WITH  MINERALS) TABS tablet, Take 1 tablet by mouth daily. (Patient not taking: Reported on 09/19/2022), Disp: , Rfl:    phenytoin (DILANTIN) 100 MG ER capsule, Take 3 capsules (300 mg total) by mouth at bedtime., Disp: 90 capsule, Rfl: 3   TYLENOL 500 MG tablet, Take 500-1,000 mg by mouth every 6 (six) hours as needed for mild pain (or headaches)., Disp: , Rfl:   Vitals   Vitals:   04-20-2023 1430  BP: (!) 145/105  Pulse: (!) 101  Resp: 18  SpO2: 100%    There is no height or weight on file to calculate BMI.  Physical Exam   Physical Exam  HEENT:  Center/AT Lungs: Respirations unlabored Extremities: Warm and well perfused.   Neurological Examination Mental Status: Awake and alert. Speech is fluent but nonsensical, consisting completely of strings of words and word-like utterances devoid of content. She has severe receptive aphasia and is unable to follow any verbal commands or answer any questions intelligibly, but can follow some pantomimed commands. She is cooperative and not agitated. No hemineglect noted.   Cranial Nerves: II: No reliable blink to threat on the left or right, but does make eye contact and attend to visual stimuli. PERRL.   III,IV, VI: No ptosis. EOMI. No nystagmus.  V: Reacts to touch bilaterally VII: Face is symmetric VIII: Alerts to voice IX,X: No hoarseness or hypophonia XI: Symmetric XII: Does not protrude tongue to command or pantomimed demonstration of this maneuver Motor: Right : Upper extremity   5/5    Left:     Upper extremity   5/5  Lower extremity   5/5     Lower extremity   5/5 Sensory: Reacts to touch x 4 Deep Tendon Reflexes: 3+ and symmetric throughout Cerebellar: No ataxia with spontaneous movements. Unable to follow commands for definitive assessment Gait: Deferred  Labs/Imaging/Neurodiagnostic studies   CBC:  Recent Labs  Lab 20-Apr-2023 1447  WBC 7.4  NEUTROABS 5.5  HGB 13.9  HCT 40.5  MCV 103.3*  PLT 318   Basic Metabolic Panel:  Lab  Results  Component Value Date   NA 128 (L) 2023-04-20   K 3.7 Apr 20, 2023   CO2 20 (L) 04/20/2023   GLUCOSE 116 (H) Apr 20, 2023   BUN <5 (L) Apr 20, 2023   CREATININE 0.52 04/20/23   CALCIUM 8.3 (L) 04/20/2023   GFRNONAA >60 04/20/23   GFRAA >60 04/09/2019   Lipid Panel: No results found for: "LDLCALC" HgbA1c: No results found for: "HGBA1C" Urine Drug Screen:     Component Value Date/Time   LABOPIA NONE DETECTED 02/20/2021 1950   COCAINSCRNUR NONE DETECTED 02/20/2021 1950   LABBENZ POSITIVE (A) 02/20/2021 1950   AMPHETMU NONE DETECTED 02/20/2021 1950   THCU NONE DETECTED 02/20/2021 1950   LABBARB NONE DETECTED 02/20/2021 1950    Alcohol Level     Component Value Date/Time   ETH <10 Apr 20, 2023 1447   INR  Lab Results  Component Value Date   INR 0.9 11/07/2020   APTT  Lab Results  Component Value Date   APTT 31 11/07/2020  AED levels:  Lab Results  Component Value Date   PHENYTOIN <2.5 (L) 09/19/2022   LEVETIRACETA <2.0 (L) 09/19/2022     ASSESSMENT  Alyssa Little is a 42 y.o. female who has a past medical history of Alcohol abuse, Hypokalemia (10/19/2022), SAH (subarachnoid hemorrhage) (HCC), Seizure disorder (HCC), TBI (traumatic brain injury) (HCC), and Transaminitis., who presented from home to the [facility] this afternoon for evaluation after her husband found her to be extremely confused at 1300. On arrival to the ED she was struggling to follow commands and had inappropriate words. She was felt to be postictal from an unwitnessed seizure at home. .She has a longstanding history of seizures. She is currently being switched over to Keppra from Dilantin, the latter being tapered. EDP consulted Dr. Amada Jupiter by telephone who recommended Keppra load and transfer to [facility] for LTM EEG. Keppra 1000 mg IV was loaded in the ED, but her "very atypical behaviors" continued. Home medications list includes phenytoin ER 300 mg at bedtime, but in the context of her taper  it is unclear if she is on a lower dose or now completely off this medication. Keppra XR 3000 mg at bedtime is listed on her home medications list as well.  - Exam reveals dense receptive aphasia.  - CT head reveals no acute abnormalities. Encephalomalacia is seen in the high anterior frontal lobes bilaterally, unchanged from the prior CT head dated September 19, 2022.  - Most likely etiology for her presentation is postictal aphasia. Lower on the DDx would be subclinical status involving the left perisylvian cortices. Lack of any additional neurological deficits outside of her receptive aphasia militates against acute stroke as the etiology, but will need to investigate further with MRI.    RECOMMENDATIONS  - Keppra and phenytoin levels (ordered) - STAT MRI brain without contrast - STAT EEG - IV Ativan PRN seizure  - Frequent neuro checks - Inpatient seizure precautions - Continue Keppra at 1500 mg IV BID (ordered)   Addendum: - EEG: Continuous slow, generalized. This technically difficult study is suggestive of moderate diffuse encephalopathy. No seizures or epileptiform discharges were seen throughout the recording. - MRI brain: Evaluation is somewhat limited by motion artifact. Within this limitation, no acute intracranial process. No evidence of acute or subacute infarct. - Phenytoin level < 2.5, consistent with having stopped this medication - Keppra level pending   ______________________________________________________________________    Dessa Phi, Alyssa Bautch, MD [facility]

## 2023-04-12 NOTE — ED Triage Notes (Signed)
Pt BIBA from home. Husband reported that he found her extremely confused at 1300. Confusion has not improved. Pt does have seizure hx. Pt struggles to follow commands and has inappropriate words.  AOX0

## 2023-04-12 NOTE — Progress Notes (Signed)
Pt is to transfer to [facility] for LTM EEG will try for EEG when pt arrives.

## 2023-04-12 NOTE — ED Notes (Addendum)
Verbal Order for ativan due to Pts increased frustration/agitation by Neurologist. Pt is incoherent and unable to coherently answer questions.

## 2023-04-12 NOTE — Telephone Encounter (Signed)
Prior Authorization for patient (Azelex 20% cream) came through on cover my meds was submitted with last office notes awaiting approval or denial.  KEY:[id number]

## 2023-04-12 NOTE — ED Notes (Signed)
[facility] called and gave report to charge RN

## 2023-04-12 NOTE — Progress Notes (Signed)
Pt has gone to MRI LTM will start once pt returns.

## 2023-04-12 NOTE — Progress Notes (Signed)
LTM canceled pt unable to keep leads on.

## 2023-04-12 NOTE — Progress Notes (Signed)
Signed out by Dr. Otelia Limes to follow-up on stat MRI of the brain to rule out a stroke.  Excessive motion artifact.  No evidence of acute stroke  -- Milon Dikes, MD Neurologist [facility]

## 2023-04-12 NOTE — Hospital Course (Signed)
Receptive aphasia Neuro saw and started stroke woke up History unreliable  Was seen in the clinic today at 8:45 am and didn't seem confused

## 2023-04-12 NOTE — ED Provider Notes (Signed)
6:02 PM Patient transferred from [facility].  She is awake and alert but having odd behavior and when she speaks it often does not make sense.  She was able to tell me she needs to go to the bathroom but then responds with nonsensical phrases upon asking something else.  Will consult neurology as per prior plan she was to be sent over here for neuro eval and EEG.  6:47 PM I discussed with Dr. Otelia Limes of neurology.  He is concerned that she is having receptive aphasia and is having a stroke.  However her last known well is difficult to ascertain, though she did go to internal medicine this morning around 845.  He recommends MRI, EEG and medicine admit.  Will consult internal medicine as it is one of their patients.    Internal medicine will admit.    Pricilla Loveless, MD 04/12/23 2011

## 2023-04-12 NOTE — ED Provider Notes (Signed)
[place] EMERGENCY DEPARTMENT AT [facility] Provider Note   CSN: [id number] Arrival date & time: 04/12/23  1415     History Chief Complaint  Patient presents with   Altered Mental Status    HPI Alyssa Little is a 42 y.o. female presenting for AMS.  Longstanding history of seizures, currently on a Keppra from Dilantin taper. No witnessed seizure activity over the last 90 minutes of observation from EMS however prolonged postictal phase.   Patient's recorded medical, surgical, social, medication list and allergies were reviewed in the Snapshot window as part of the initial history.   Review of Systems   Review of Systems  Unable to perform ROS: Mental status change    Physical Exam Updated Vital Signs BP (!) 145/105   Pulse (!) 101   Resp 18   SpO2 100%  Physical Exam Vitals and nursing note reviewed.  Constitutional:      General: She is not in acute distress.    Appearance: She is well-developed.  HENT:     Head: Normocephalic and atraumatic.  Eyes:     Conjunctiva/sclera: Conjunctivae normal.  Cardiovascular:     Rate and Rhythm: Normal rate and regular rhythm.     Heart sounds: No murmur heard. Pulmonary:     Effort: Pulmonary effort is normal. No respiratory distress.     Breath sounds: Normal breath sounds.  Abdominal:     General: There is no distension.     Palpations: Abdomen is soft.     Tenderness: There is no abdominal tenderness. There is no right CVA tenderness or left CVA tenderness.  Musculoskeletal:        General: No swelling or tenderness. Normal range of motion.     Cervical back: Neck supple.  Skin:    General: Skin is warm and dry.  Neurological:     General: No focal deficit present.     Mental Status: She is alert and oriented to person, place, and time. Mental status is at baseline.     Cranial Nerves: No cranial nerve deficit.     Comments: Very atypical neurologic exam. Immediately responded her name when  asked, intermittent without further questions.  She will follow some commands but not others.  When asked to squeeze hand she lifts her arms above her head.  Bilateral strength appears intact.  When asked to trial finger-nose, she uses the contralateral hand.       ED Course/ Medical Decision Making/ A&P    Procedures .Critical Care  Performed by: Glyn Ade, MD Authorized by: Glyn Ade, MD   Critical care provider statement:    Critical care time (minutes):  45   Critical care was necessary to treat or prevent imminent or life-threatening deterioration of the following conditions:  CNS failure or compromise   Critical care was time spent personally by me on the following activities:  Development of treatment plan with patient or surrogate, discussions with consultants, evaluation of patient's response to treatment, examination of patient, ordering and review of laboratory studies, ordering and review of radiographic studies, ordering and performing treatments and interventions, pulse oximetry, re-evaluation of patient's condition and review of old charts   Care discussed with: accepting provider at another facility      Medications Ordered in ED Medications  LORazepam (ATIVAN) injection 1 mg (has no administration in time range)  levETIRAcetam (KEPPRA) IVPB 1000 mg/100 mL premix (1,000 mg Intravenous New Bag/Given 04/12/23 1439)    Medical Decision Making:  Alyssa Little is a 42 y.o. female who presented to the ED today with altered mental status detailed above.    Handoff received from EMS.  Patient placed on continuous vitals and telemetry monitoring while in ED which was reviewed periodically.  Complete initial physical exam performed, notably the patient  was currently on stable no acute distress.  See neurologic exam above.  Does not appear to have any focal deficits..    Reviewed and confirmed nursing documentation for past medical history, family  history, social history.    Initial Assessment:   With the patient's presentation of altered mental status, most likely diagnosis is delerium 2/2 infectious etiology (UTI/CAP/URI) vs metabolic abnormality (Na/K/Mg/Ca) vs nonspecific etiology. Other diagnoses were considered including (but not limited to) CVA, ICH, intracranial mass, critical dehydration, heptatic dysfunction, uremia, hypercarbia, intoxication, endrocrine abnormality, toxidrome. These are considered less likely due to history of present illness and physical exam findings.   This is most consistent with an acute life/limb threatening illness complicated by underlying chronic conditions.  Initial Plan:  CTH to evaluate for intracranial etiology of patient's symptoms  Screening labs including CBC and Metabolic panel to evaluate for infectious or metabolic etiology of disease.  Urinalysis with reflex culture ordered to evaluate for UTI or relevant urologic/nephrologic pathology.  CXR to evaluate for structural/infectious intrathoracic pathology.  EKG to evaluate for cardiac pathology Objective evaluation as below reviewed   Initial Study Results:   Laboratory  All laboratory results reviewed without evidence of clinically relevant pathology.    EKG EKG was reviewed independently. Rate, rhythm, axis, intervals all examined and without medically relevant abnormality. ST segments without concerns for elevations.    Radiology:  All images reviewed independently. Agree with radiology report at this time.   CT HEAD WO CONTRAST ( ) Result Date: 04/12/2023 CLINICAL DATA:  Mental status change, unknown cause EXAM: CT HEAD WITHOUT CONTRAST TECHNIQUE: Contiguous axial images were obtained from the base of the skull through the vertex without intravenous contrast. RADIATION DOSE REDUCTION: This exam was performed according to the departmental dose-optimization program which includes automated exposure control, adjustment of the mA and/or  kV according to patient size and/or use of iterative reconstruction technique. COMPARISON:  CT head September 19, 2022. FINDINGS: Brain: No evidence of acute infarction, hemorrhage, hydrocephalus, extra-axial collection or mass lesion/mass effect. Encephalomalacia in the high anterior frontal lobes bilaterally is unchanged. Vascular: No hyperdense vessel identified. Skull: No acute fracture. Sinuses/Orbits: Clear sinuses.  No acute orbital findings. Other: No mastoid effusions. IMPRESSION: 1. No evidence of acute intracranial abnormality. 2. Similar encephalomalacia in the high anterior frontal lobes, likely posttraumatic. Electronically Signed   By: Feliberto Harts M.D.   On: 04/12/2023 15:32      Consults: Case discussed with Neurology Dr. Amada Jupiter.   Final Assessment and Plan:   Very atypical behaviors continued after Keppra load.  It is difficult to determine if this is psychiatric or neurologic.  Consulted neurology who recommended ED to ED transfer to main campus for EEG and Ativan load prior to transfer.  Communicated with main hospital campus for transfer coordination.    Emergency Department Medication Summary:   Medications  LORazepam (ATIVAN) injection 1 mg (has no administration in time range)  levETIRAcetam (KEPPRA) IVPB 1000 mg/100 mL premix (1,000 mg Intravenous New Bag/Given 04/12/23 1439)     Clinical Impression:  1. Altered mental status, unspecified altered mental status type      Data Unavailable   Final Clinical Impression(s) / ED Diagnoses Final  diagnoses:  Altered mental status, unspecified altered mental status type    Rx / DC Orders ED Discharge Orders     None         Glyn Ade, MD 04/12/23 1600

## 2023-04-12 NOTE — Progress Notes (Signed)
STAT EEG complete - results pending. Set up very tense, belligerence apparent.

## 2023-04-12 NOTE — Patient Instructions (Signed)
[name], it was a pleasure seeing you today! You endorsed feeling well today. Below are some of the things we talked about this visit. We look forward to seeing you in the follow up appointment!  Today we discussed: For your seizures, continue the keppra and wean off the dilantin as suggested by neurology.   I will start a cream for your rosacea, use it twice daily. Avoid sun exposure to face and use sunscreen. Alcohol and spicy foods can make it worse as well.   I am also starting folic acid therapy for you. Start taking this every day.   I have ordered the following labs today:  Lab Orders  No laboratory test(s) ordered today      Referrals ordered today:    Referral Orders         Ambulatory referral to Dermatology      I have ordered the following medication/changed the following medications:   Stop the following medications: Medications Discontinued During This Encounter  Medication Reason   divalproex (DEPAKOTE ER) 500 MG 24 hr tablet Patient has not taken in last 30 days   hydrOXYzine (ATARAX) 10 MG tablet Patient has not taken in last 30 days   MIDOL COMPLETE 500-60-15 MG TABS Patient has not taken in last 30 days     Start the following medications: Meds ordered this encounter  Medications   hydrOXYzine (ATARAX) 10 MG tablet    Sig: Take 1 tablet (10 mg total) by mouth 3 (three) times daily as needed.    IM program   azelaic acid (AZELEX) 20 % cream    Sig: Apply topically 2 (two) times daily. After skin is thoroughly washed and patted dry, gently but thoroughly massage a thin film of azelaic acid cream into the affected area twice daily, in the morning and evening.    Dispense:  50 g    Refill:  0   folic acid (FOLVITE) 1 MG tablet    Sig: Take 1 tablet (1 mg total) by mouth daily.    Dispense:  30 tablet    Refill:  2     Follow-up: 4 months with Dr. Sloan Leiter  Please make sure to arrive 15 minutes prior to your next appointment. If you  arrive late, you may be asked to reschedule.   We look forward to seeing you next time. Please call our clinic at [phone] if you have any questions or concerns. The best time to call is Monday-Friday from 9am-4pm, but there is someone available 24/7. If after hours or the weekend, call the main hospital number and ask for the Internal Medicine Resident On-Call. If you need medication refills, please notify your pharmacy one week in advance and they will send [name] a request.  Thank you for letting us take part in your care. Wishing you the best!  Thank you, Gwenevere Abbot, MD

## 2023-04-12 NOTE — H&P (Incomplete)
Date: 04/13/2023         Patient Name:  Alyssa Little MRN: [id number]  DOB: [birth year] Age / Sex: 42 y.o., female   PCP: Rudene Christians, DO         Medical Service: Internal Medicine Teaching Service         Attending Physician: Dr. Reymundo Poll, MD    First Contact:  Dr. Manuela Neptune, MD Pager [phone] Pager: [phone]  Second Contact:  Dr. Marrianne Mood, MD Pager [phone] Pager: [phone]       After Hours (After 5p/  First Contact Pager: [phone]  weekends / holidays): Second Contact Pager: [phone]   Chief Concern: Altered Mental Status   History of Present Illness: Alyssa Little is a 42 year old woman with a past medical history  of prior traumatic brain injury c/b subarachnoid hemorrhage, seizures on antiepileptics, and chronic alcohol use disorder who initially presented to [facility] emergency department after she was found confused by her husband at home   During this evaluation, patient was not able to participate in the history gathering portion of this exam. Information below was gathered from chart review, consultant recommendations, and communication with significant other.   Per EMS, no seizure-like activity after they arrived at the patient's home or while en route to [facility]. On initial examination, patient was struggling to follow commands and was inappropriately responding to questions.  It was thought that she might have an unwitnessed seizure. Neurology was consulted at [facility]; patient received Keppra loading dose without improvement. Patient was then transferred to [facility] s/p ativan load for continuous EEG monitoring. Since her transfer, patient has been more awake, fluent speech, but unable to answer to questions appropriately.   Of note ,patient was seen in the IMTS clinic today around 9AM where she did not show signs of confusion or intoxication per MD.   Theron Arista, patient's romantic partner of 7 years, presented to the ED ~ 9 PM.  He  reports that patient drink about 3 PBRs every day, sometimes more, which is chronic for both of them. He is unsure if she had anything to drink before her appointment in the clinic today, but he suspects that she was sipping beer as opposed to her usual drinking for the past two days. No recent headaches,  fevers, respiratory or GI infectious symptoms. Poor appetite and general weight loss for the past 6 months. Additionally, He had her menses last week; uses tampons. They also have had a flea infestation since their cat died last week. She had multiple bites on her lower legs in the past three weeks that are now healing.   ROS; Unable to assess due to patient's mentation   Past Medical History: Patient Active Problem List   Diagnosis Date Noted   Acute encephalopathy 04/12/2023   Macrocytosis without anemia 02/05/2023   Weight loss 10/19/2022   Elevated blood pressure reading 06/06/2022   Panic attack 11/09/2021   Tobacco use 11/09/2021   Healthcare maintenance 03/21/2021   Protein-calorie malnutrition (HCC) 09/29/2018   Rosacea 09/29/2018   Alcohol use disorder    Seizures (HCC)    Past Medical History:  Diagnosis Date   Alcohol abuse    Hypokalemia 10/19/2022   SAH (subarachnoid hemorrhage) (HCC)    Seizure disorder (HCC)    TBI (traumatic brain injury) (HCC)    Transaminitis     Medications: Per chart review  Keppra 500 mg ,takes 6 tablets at bedtime Phenytoin 100 mg , takes  3 tablets at bedtime- ?? Hydroxyzine 10 mg  Multivitamin  Tylenol 500 mg  Folic Acid    Surgical History: Past Surgical History:  Procedure Laterality Date   CHOLECYSTECTOMY      Family History:  Didn't ask due to patient's state of confusion   Social History:  Patient lives with her partner at home, unemployed, is able to do all ADLS/iADLS. Drinks about 3 PBRs daily but denies any there illicit drugs. Follows up with Dr Sloan Leiter for her primary needs .   Allergies: Allergies  Allergen  Reactions   Shellfish Allergy Anaphylaxis   Flagyl [Metronidazole] Hives   Penicillins Hives, Rash and Other (See Comments)    Tolerated cephalosporins including ancef  Has patient had a PCN reaction causing immediate rash, facial/tongue/throat swelling, SOB or lightheadedness with hypotension: Unknown Has patient had a PCN reaction causing severe rash involving mucus membranes or skin necrosis: Unknown Has patient had a PCN reaction that required hospitalization: Unknown Has patient had a PCN reaction occurring within the last 10 years: Unknown If all of the above answers are "NO", then may    Physical Exam: Blood pressure 116/87, pulse (!) 104, temperature 98.2 F (36.8 C), temperature source Oral, resp. rate 17, SpO2 99%.  Constitutional: Not in acute distress, sitting up in bed HEENT: Patient with eyes shut Cardiovascular: Tachycardic, no murmurs on exam. Symmetric radial, DP and PT pulses Pulmonary/Chest: No increased more or breathing, speaking in full sentenses. Clear to auscultations bilaterally, no wheezes or rales. Abdominal: unable to assess initially Extremities: No lower extremity edema noted, warm and dry Psychiatric: Poor insight; fluent speech and confused GU: Normal vaginal orifice, no abnormal discharge noted. No strings which could indicate an IUD or tampon.  Pertinent findings on re-evaluation HEENT: horizontal nystagmus at rest. No nuchal rigidity Abdomen : No angiomata or spider angioma, non distended and no fluid wave noted Skin: Limited full skin exam given patient's presentation; Bilateral lower extremities: diffuse small, non blanching, erythematous papules, with some excoriations visible on lateral sides of  bilateral legs. No evidence of fleas.   Neuro: 4/5 UE grip strength.Able to passively move all LE.Other portions of the exam limited due to patient's mentation  Labs:  WBC 7.4 N Blood glucose 116 H Na 128 L Cr 0.52 N AST 91 , ALT 62 H Ethanol <  10 N U/A with ketones     Latest Ref Rng & Units 04/12/2023    2:47 PM 02/04/2023    2:51 PM 09/19/2022    6:00 PM  CBC  WBC 4.0 - 10.5 K/uL 7.4  7.0  24.2   Hemoglobin 12.0 - 15.0 g/dL 30.8  65.7  84.6   Hematocrit 36.0 - 46.0 % 40.5  40.0  37.5   Platelets 150 - 400 K/uL 318  337  308         Latest Ref Rng & Units 04/12/2023    2:47 PM 10/17/2022    5:30 PM 09/19/2022    7:13 PM  CMP  Glucose 70 - 99 mg/dL 962  95  952   BUN 6 - 20 mg/dL 5  5  6    Creatinine 0.44 - 1.00 mg/dL 8.41  3.24  4.01   Sodium 135 - 145 mmol/L 128  137  136   Potassium 3.5 - 5.1 mmol/L 3.7  4.1  3.1   Chloride 98 - 111 mmol/L 98  96  101   CO2 22 - 32 mmol/L 20  24  18  Calcium 8.9 - 10.3 mg/dL 8.3  8.5  7.8   Total Protein 6.5 - 8.1 g/dL 7.5   6.0   Total Bilirubin <1.2 mg/dL 1.1   0.6   Alkaline Phos 38 - 126 U/L 110   102   AST 15 - 41 U/L 91   62   ALT 0 - 44 U/L 62   37     Images and other studies:  US Abdomen Complete Result Date: 04/13/2023 CLINICAL DATA:  Possible cirrhosis EXAM: ABDOMEN ULTRASOUND COMPLETE COMPARISON:  09/19/2022 FINDINGS: IMPRESSION: Mild fatty infiltration of the liver. No other focal abnormality is seen. Electronically Signed   By: Alcide Clever M.D.   On: 04/13/2023 00:09   MR BRAIN WO CONTRAST Result Date: 04/12/2023 IMPRESSION: Evaluation is somewhat limited by motion artifact. Within this limitation, no acute intracranial process. No evidence of acute or subacute infarct. Electronically Signed   By: Wiliam Ke M.D.   On: 04/12/2023 21:31   CT HEAD WO CONTRAST ( ) Result Date: 04/12/2023 MPRESSION: 1. No evidence of acute intracranial abnormality. 2. Similar encephalomalacia in the high anterior frontal lobes, likely posttraumatic. Electronically Signed   By: Feliberto Harts M.D.   On: 04/12/2023 15:32    EKG: Sinus rhythm, no acute ischemic changes    Assessment & Plan:  Alyssa Little is a 42 y.o. SAH with TBI ,Seizures on Keppra and  alcohol use disorder and anxiety  who presented for an acute change in mental status and admitted for acute encephalopathy.   Principal Problem:   Acute encephalopathy Active Problems:   Seizures (HCC)   Alcohol use disorder   Protein-calorie malnutrition (HCC)   Macrocytosis without anemia  # Acute encephalopathy #Hyponatremia  Sudden change in mental status, concerning for metabolic causes vs infectious etiologies vs intoxications vs structural defects. Blood glucose of 116 on arrival, Mildly elevated liver enzymes,Hyponatremia of 128 which could potentially cause her to be encephalopathic ,will also get a TSH level/ AM cortisol to rule out other metabolic causes. Hx of alcohol use disorder  but negative ethanol level on arrival, UDS pending. Pt is not on any benzodiazepine or baclofen. No leukocytosis ,afebrile, so signs of AKI,lungs are clear, sating well on RA. EKG without signs of ischemia. CT head unremarkable for any intracranial hemorrhaging. Spot EEG also didn't show evidence of seizures. MRI brain for cva rule out given receptive aphasia without signs of infarct but encephalitis can't  be completely ruled out given the multiple artifact during MRI.  With her history of chronic alcohol use and nystagmus on exam, I wonder if she has a  wernicke component to her presentation vs withdrawal. If no improvement with these measures, patient may require an LP for further evaluation. - U/A with kenotes, otherwise normal - Neurology recs appreciated  - Frequent neuro checks  - empiric treatment for wernicke's encephalopathy below - AM Cortisol  TSH - Follow up Urine studies - Bladder scan  - PT/OT/SLP - Monitor fever curve -Consider LP if no improvement of symptoms in the next 24 hrs.  #Hx of Seizures  On Keppra 3000 mg for generalized tonic-clonic seizures. Last seen by outpatient neurologist on 12/18. Prior AED depakote and has now been weaned from phenytoin  in the past two months. Spot  EEG is suggestive of moderate diffuse encephalopathy, but no evidence of seizures noted throughout the recording. Unable to do continuous monitoring to capture potential nonconvulsive seizures. as patient did not tolerate the EEG. - Continue home keppra  -  Follow up on Keppra level - Follow up on neurology recs  - Seizure precautions  - Ativan PRN for sustained seizures  #Alcohol use disorder  #Concern for EtOH withdrawal #Concern for wernicke's encephalopathy Ethanol level < 10 ,hx of alcohol use disorder,unclear when she had her last drink base on unreliable history at this time. Agitation, tachycardia, and hypertension improved with a dose of Ativan/ No changes in what seems to be a receptive aphasia. She perseverates on similar stories when she's asked a question. Unsure if this would be consider confabulation, but given horizontal nystagmus, will treat for presumptive wernicke's encephalopathy as well - CIWA with ativan - 500 mg IV thiamine TID x days, followed by 250 mg IV thiamine BID x5 days  #Elevated Liver enzymes #Hx of hepatic steatosis AST 91 , ALT 62.  Evidence of hepatic steatosis on  09/2022. Fib4 of 1.5. No stigmata of cirrhosis on exam. No over tenderness on palpation of liver or overt signs of hepatitis.  Maintained synthetic liver function. RUQ ultrasound with mild progression of steatosis.  - Monitor CMP  - This patient may benefit from therapeutic support for alcohol use cessation  Hyponatremia Protein-calorie malnutrition Weight loss Suspect hypovolemia vs beer potomania. Ketones  - FU urine studies - Replete electrolytes - Follow up AM Na - Monitor for refeeding syndrome - RD consult - Outpatient cancer screening  #Hx of Anxiety  - Resume home Hydroxyzine 10 mg during this admission when able to take PO  #Macrocytosis without anemia - Check B12 levels and supplement - Receiving folate supplementation  #Dermatitis Likely in the setting of flea bites vs  contact dermatitis. Given patient presentation, Petechiae considered given recent menses, chronic etoh use, and potential toxic shock syndrome.  - Monitor fever curve - Monitor PLT, fever curve, and eruption progression  Level of care: Med Telemetry ,OBV  Diet: NPO  IVF: NS bolus VTE: enoxaparin (LOVENOX) injection 40 mg Start: 04/12/23 2245 SCDs Start: 04/12/23 2037 Code: FULL  Surrogate: Unknown   Signed: Kathleen Lime, MD 04/13/2023, 3:03 AM

## 2023-04-12 NOTE — Telephone Encounter (Signed)
Decision:Denied  Denied. [facility] does not cover the following service(s) in the [facility] Plan: [id number] AZELEX 20% Cream This drug's manufacturer does not participate in the [facility] Drug Rebate Program and therefore the drug is not a covered pharmacy benefit. Covered options include: adapalene / benzoyl peroxide (generic for Epiduo Forte), adapalene / benzoyl peroxide (generic for Epiduo Gel), adapalene cream / gel (generic for Differin 45 GM tube)), azelaic acid gel (generic for Finacea), clindamycin phosphate pledgets / solution (generic for Cleocin-T), clindamycin-benzoyl peroxide gel (generic for Duac), erythromycin gel (generic for Emcin, Erycette, EryGel, et. al.), erythromycin solution (generic for [employer], EryDerm, EryMax, et. al), erythromycin-benzoyl peroxide gel (generic for Benzamycin), Finacea Gel, Retin-A Cream / Gel (BRAND), Retin-A Micro Gel (BRAND)

## 2023-04-13 ENCOUNTER — Observation Stay (HOSPITAL_COMMUNITY): Payer: Medicaid Other

## 2023-04-13 ENCOUNTER — Encounter (HOSPITAL_COMMUNITY): Payer: Self-pay | Admitting: Internal Medicine

## 2023-04-13 DIAGNOSIS — E876 Hypokalemia: Secondary | ICD-10-CM | POA: Diagnosis not present

## 2023-04-13 DIAGNOSIS — D509 Iron deficiency anemia, unspecified: Secondary | ICD-10-CM | POA: Diagnosis present

## 2023-04-13 DIAGNOSIS — E512 Wernicke's encephalopathy: Secondary | ICD-10-CM | POA: Diagnosis present

## 2023-04-13 DIAGNOSIS — Z87820 Personal history of traumatic brain injury: Secondary | ICD-10-CM | POA: Diagnosis not present

## 2023-04-13 DIAGNOSIS — Z91199 Patient's noncompliance with other medical treatment and regimen due to unspecified reason: Secondary | ICD-10-CM | POA: Diagnosis not present

## 2023-04-13 DIAGNOSIS — F23 Brief psychotic disorder: Secondary | ICD-10-CM | POA: Insufficient documentation

## 2023-04-13 DIAGNOSIS — R569 Unspecified convulsions: Secondary | ICD-10-CM | POA: Diagnosis not present

## 2023-04-13 DIAGNOSIS — G9340 Encephalopathy, unspecified: Secondary | ICD-10-CM | POA: Diagnosis not present

## 2023-04-13 DIAGNOSIS — Z91013 Allergy to seafood: Secondary | ICD-10-CM | POA: Diagnosis not present

## 2023-04-13 DIAGNOSIS — K760 Fatty (change of) liver, not elsewhere classified: Secondary | ICD-10-CM | POA: Diagnosis present

## 2023-04-13 DIAGNOSIS — F419 Anxiety disorder, unspecified: Secondary | ICD-10-CM | POA: Diagnosis present

## 2023-04-13 DIAGNOSIS — Y900 Blood alcohol level of less than 20 mg/100 ml: Secondary | ICD-10-CM | POA: Diagnosis present

## 2023-04-13 DIAGNOSIS — F1090 Alcohol use, unspecified, uncomplicated: Secondary | ICD-10-CM | POA: Diagnosis not present

## 2023-04-13 DIAGNOSIS — R4701 Aphasia: Secondary | ICD-10-CM | POA: Diagnosis present

## 2023-04-13 DIAGNOSIS — E440 Moderate protein-calorie malnutrition: Secondary | ICD-10-CM | POA: Diagnosis present

## 2023-04-13 DIAGNOSIS — Z79899 Other long term (current) drug therapy: Secondary | ICD-10-CM | POA: Diagnosis not present

## 2023-04-13 DIAGNOSIS — F068 Other specified mental disorders due to known physiological condition: Secondary | ICD-10-CM | POA: Diagnosis present

## 2023-04-13 DIAGNOSIS — R4182 Altered mental status, unspecified: Secondary | ICD-10-CM | POA: Diagnosis not present

## 2023-04-13 DIAGNOSIS — G9389 Other specified disorders of brain: Secondary | ICD-10-CM | POA: Diagnosis present

## 2023-04-13 DIAGNOSIS — F10139 Alcohol abuse with withdrawal, unspecified: Secondary | ICD-10-CM | POA: Diagnosis present

## 2023-04-13 DIAGNOSIS — F17210 Nicotine dependence, cigarettes, uncomplicated: Secondary | ICD-10-CM | POA: Diagnosis present

## 2023-04-13 DIAGNOSIS — F32A Depression, unspecified: Secondary | ICD-10-CM | POA: Diagnosis present

## 2023-04-13 DIAGNOSIS — D7589 Other specified diseases of blood and blood-forming organs: Secondary | ICD-10-CM | POA: Diagnosis present

## 2023-04-13 DIAGNOSIS — Z880 Allergy status to penicillin: Secondary | ICD-10-CM | POA: Diagnosis not present

## 2023-04-13 DIAGNOSIS — E871 Hypo-osmolality and hyponatremia: Secondary | ICD-10-CM | POA: Diagnosis present

## 2023-04-13 DIAGNOSIS — I10 Essential (primary) hypertension: Secondary | ICD-10-CM | POA: Diagnosis present

## 2023-04-13 DIAGNOSIS — H5509 Other forms of nystagmus: Secondary | ICD-10-CM | POA: Diagnosis present

## 2023-04-13 DIAGNOSIS — G40909 Epilepsy, unspecified, not intractable, without status epilepticus: Secondary | ICD-10-CM | POA: Diagnosis present

## 2023-04-13 DIAGNOSIS — L258 Unspecified contact dermatitis due to other agents: Secondary | ICD-10-CM | POA: Diagnosis present

## 2023-04-13 LAB — TSH: TSH: 4.448 u[IU]/mL (ref 0.350–4.500)

## 2023-04-13 LAB — RAPID URINE DRUG SCREEN, HOSP PERFORMED
Amphetamines: NOT DETECTED
Barbiturates: NOT DETECTED
Benzodiazepines: POSITIVE — AB
Cocaine: NOT DETECTED
Opiates: NOT DETECTED
Tetrahydrocannabinol: NOT DETECTED

## 2023-04-13 LAB — URINALYSIS, ROUTINE W REFLEX MICROSCOPIC
Bilirubin Urine: NEGATIVE
Glucose, UA: NEGATIVE mg/dL
Hgb urine dipstick: NEGATIVE
Ketones, ur: 20 mg/dL — AB
Leukocytes,Ua: NEGATIVE
Nitrite: NEGATIVE
Protein, ur: NEGATIVE mg/dL
Specific Gravity, Urine: 1.023 (ref 1.005–1.030)
pH: 5 (ref 5.0–8.0)

## 2023-04-13 LAB — PREGNANCY, URINE: Preg Test, Ur: NEGATIVE

## 2023-04-13 LAB — RENAL FUNCTION PANEL
Albumin: 3.2 g/dL — ABNORMAL LOW (ref 3.5–5.0)
Anion gap: 12 (ref 5–15)
BUN: <5 mg/dL — ABNORMAL LOW (ref 6–20)
CO2: 20 mmol/L — ABNORMAL LOW (ref 22–32)
Calcium: 9.3 mg/dL (ref 8.9–10.3)
Chloride: 105 mmol/L (ref 98–111)
Creatinine, Ser: 0.56 mg/dL (ref 0.44–1.00)
GFR, Estimated: >60 mL/min (ref >60)
Glucose, Bld: 94 mg/dL (ref 70–99)
Phosphorus: 3.5 mg/dL (ref 2.5–4.6)
Potassium: 3.7 mmol/L (ref 3.5–5.1)
Sodium: 137 mmol/L (ref 135–145)

## 2023-04-13 LAB — HEPATITIS PANEL, ACUTE
HCV Ab: NONREACTIVE
Hep A IgM: NONREACTIVE
Hep B C IgM: NONREACTIVE
Hepatitis B Surface Ag: NONREACTIVE

## 2023-04-13 LAB — PROTIME-INR
INR: 1 (ref 0.8–1.2)
Prothrombin Time: 13.2 s (ref 11.4–15.2)

## 2023-04-13 LAB — MAGNESIUM: Magnesium: 1.8 mg/dL (ref 1.7–2.4)

## 2023-04-13 LAB — OSMOLALITY: Osmolality: 290 mosm/kg (ref 275–295)

## 2023-04-13 LAB — HIV ANTIBODY (ROUTINE TESTING W REFLEX): HIV Screen 4th Generation wRfx: NONREACTIVE

## 2023-04-13 LAB — CORTISOL-AM, BLOOD: Cortisol - AM: 24.3 ug/dL — ABNORMAL HIGH (ref 6.7–22.6)

## 2023-04-13 LAB — ACETAMINOPHEN LEVEL: Acetaminophen (Tylenol), Serum: <10 ug/mL — ABNORMAL LOW (ref 10–30)

## 2023-04-13 LAB — VITAMIN B12: Vitamin B-12: 381 pg/mL (ref 180–914)

## 2023-04-13 MED ORDER — RISPERIDONE 1 MG/ML PO SOLN
1.0000 mg | Freq: Once | ORAL | Status: AC
  Administered 2023-04-13: 1 mg via ORAL
  Filled 2023-04-13: qty 1

## 2023-04-13 MED ORDER — LEVETIRACETAM IN NACL 1500 MG/100ML IV SOLN
1500.0000 mg | Freq: Two times a day (BID) | INTRAVENOUS | Status: DC
  Administered 2023-04-13 (×3): 1500 mg via INTRAVENOUS
  Filled 2023-04-13 (×5): qty 100

## 2023-04-13 MED ORDER — RISPERIDONE 1 MG/ML PO SOLN: 1.0000 mg | Freq: Every day | ORAL | Status: DC

## 2023-04-13 NOTE — Discharge Instructions (Signed)

## 2023-04-13 NOTE — ED Notes (Signed)
Pt ambulated to restroom. 

## 2023-04-13 NOTE — Evaluation (Addendum)
Clinical/Bedside Swallow Evaluation Patient Details  Name: Suhaila Sarna MRN: [id number] Date of Birth: 01/27/1981  Today's Date: 04/13/2023 Time: SLP Start Time (ACUTE ONLY): 1051 SLP Stop Time (ACUTE ONLY): 1103 SLP Time Calculation (min) (ACUTE ONLY): 12 min  Past Medical History:  Past Medical History:  Diagnosis Date   Alcohol abuse    Hypokalemia 10/19/2022   SAH (subarachnoid hemorrhage) (HCC)    Seizure disorder (HCC)    TBI (traumatic brain injury) (HCC)    Transaminitis    Past Surgical History:  Past Surgical History:  Procedure Laterality Date   CHOLECYSTECTOMY     HPI:  42 y.o. female admitted 12/27 with AMS, receptive aphasia. Past medical history of Alcohol abuse, Hypokalemia (10/19/2022), SAH (subarachnoid hemorrhage), Seizure disorder, TBI (traumatic brain injury), and Transaminitis.    Assessment / Plan / Recommendation  Clinical Impression   SLP orders received for a clinical swallow assessment. No orders for a speech/language/cognitive assessment at this time. Speaking with pt, she is clearly confused and unable to correctly answer basic questions. She is perseverating on her medications and demonstrated poor attention to tasks for swallow assessment. She easily became somewhat agitated when asked to try cracker and swatted at SLP. Her significant other was able to redirect her to swallowing tasks with some effort.   Pt presents with a functional oropharyngeal swallow per clinical swallow assessment completed today. Oral prep and transit prompt with complete oral clearance. Pharyngeal swallow initiation appeared timely with laryngeal elevation noted. No overt or subtle s/s of aspiration noted across consistencies.   Recommend continue current diet at tolerated and PO meds as tolerated. No acute SLP needs identified for swallowing at this time. SLP will sign off. Please re-consult if pt exhibits concerns for aspiration with PO intake or if the medical  team would like a cognitive-linguistic assessment. Pt's significant other deferred assessment today given he feels she is still post-ictal and may continue to improve spontaneously. MD updated.   SLP Visit Diagnosis:  (r/o dysphagia)    Aspiration Risk  No limitations    Diet Recommendation Regular;Thin liquid    Medication Administration: Whole meds with liquid Supervision: Patient able to self feed Compensations: Minimize environmental distractions;Slow rate;Small sips/bites Postural Changes: Seated upright at 90 degrees    Other  Recommendations Oral Care Recommendations: Oral care BID    Recommendations for follow up therapy are one component of a multi-disciplinary discharge planning process, led by the attending physician.  Recommendations may be updated based on patient status, additional functional criteria and insurance authorization.  Follow up Recommendations Outpatient SLP (for cognitive-linguistic assessment if pt does not return to baseline)      Assistance Recommended at Discharge  24/7 supervision and assistance  Functional Status Assessment Patient has had a recent decline in their functional status and demonstrates the ability to make significant improvements in function in a reasonable and predictable amount of time.  Frequency and Duration  (TBD)   (TBD)       Prognosis Prognosis for improved oropharyngeal function: Good Barriers to Reach Goals: Language deficits;Cognitive deficits      Swallow Study   General Date of Onset: 04/12/23 HPI: 42 y.o. female admitted 12/27 with AMS, receptive aphasia. Past medical history of Alcohol abuse, Hypokalemia (10/19/2022), SAH (subarachnoid hemorrhage), Seizure disorder, TBI (traumatic brain injury), and Transaminitis. Type of Study: Bedside Swallow Evaluation Previous Swallow Assessment: Clinical swallow assessment on 06/24/18: WNL with regular diet and thin liquids recommended Diet Prior to this Study: Regular;Thin  liquids (  Level 0) Temperature Spikes Noted: No Respiratory Status: Room air History of Recent Intubation: No Behavior/Cognition: Alert;Requires cueing;Confused;Agitated Oral Cavity Assessment: Within Functional Limits Oral Cavity - Dentition: Adequate natural dentition;Poor condition;Missing dentition Vision: Functional for self-feeding Self-Feeding Abilities: Able to feed self Patient Positioning: Upright in bed Baseline Vocal Quality: Normal    Oral/Motor/Sensory Function Overall Oral Motor/Sensory Function: Within functional limits   Ice Chips Ice chips: Not tested   Thin Liquid Thin Liquid: Within functional limits Presentation: Cup;Self Fed    Nectar Thick Nectar Thick Liquid: Not tested   Honey Thick Honey Thick Liquid: Not tested   Puree Puree: Not tested   Solid     Solid: Within functional limits Presentation: Self Fed      [employer] 04/13/2023,11:39 AM

## 2023-04-13 NOTE — ED Notes (Signed)
PT at bedside.

## 2023-04-13 NOTE — Progress Notes (Addendum)
HD#0 SUBJECTIVE:  Patient Summary: Alyssa Little is a 42 y.o. with a pertinent PMH of alcohol use do, traumatic brain injury c/b subarachnoid hemorrhage, seizures switched to Keppra from Dilantin, and transaminitis who presented to the ED at [facility] with AMS, unable to follow commands and using inappropriate words. MRI brain with no acute intracranial process (low quality exam), EEG showing diffuse encephalopathy LTM EEG canceled, and a phenytoin level of <2.5 admitted for acute encephalopathy.   Overnight Events: LTM EEG canceled as she was pulling leads.  Interim History:  Patient remains confused. States she was watching a TV show and perseverating on her different names.   OBJECTIVE:  Vital Signs: Vitals:   04/13/23 0315 04/13/23 0330 04/13/23 0445 04/13/23 0721  BP: 134/77  117/84 (!) 135/108  Pulse: 98  77 94  Resp: 18  20 18   Temp:  97.9 F (36.6 C)  98.4 F (36.9 C)  TempSrc:    Oral  SpO2: 99%  100% 100%   Supplemental O2: Room Air SpO2: 100 %  There were no vitals filed for this visit.  No intake or output data in the 24 hours ending 04/13/23 0729 Net IO Since Admission: No IO data has been entered for this period [04/13/23 0729]  Physical Exam: Physical Exam Constitutional:      General: She is not in acute distress.    Appearance: She is not ill-appearing.  Eyes:     Conjunctiva/sclera: Conjunctivae normal.     Pupils: Pupils are equal, round, and reactive to light.  Cardiovascular:     Rate and Rhythm: Regular rhythm. Tachycardia present.     Heart sounds: No murmur heard.    No friction rub. No gallop.  Pulmonary:     Effort: Pulmonary effort is normal. No respiratory distress.  Abdominal:     General: Abdomen is flat. There is no distension.     Palpations: Abdomen is soft.     Tenderness: There is no abdominal tenderness. There is no guarding or rebound.  Skin:    General: Skin is warm and dry.  Neurological:     Mental Status: She is  alert. She is disoriented.     Cranial Nerves: No cranial nerve deficit.  Psychiatric:        Attention and Perception: She is inattentive.        Mood and Affect: Affect is inappropriate.        Speech: Speech is rapid and pressured and tangential.        Behavior: Behavior is cooperative.    Patient Lines/Drains/Airways Status     Active Line/Drains/Airways     Name Placement date Placement time Site Days   Peripheral IV 04/12/23 20 G Anterior;Proximal;Right Forearm 04/12/23  1439  Forearm  1   Wound / Incision (Open or Dehisced) 02/24/18 Laceration Head Lateral;Right;Upper 02/24/18  2305  Head  1874            ASSESSMENT/PLAN:  Assessment: Principal Problem:   Acute encephalopathy Active Problems:   Seizures (HCC)   Alcohol use disorder   Protein-calorie malnutrition (HCC)   Macrocytosis without anemia  Plan:  #Acute Encephalopathy Alyssa Little is a 42 y.o. with a pertinent PMH of alcohol use do, traumatic brain injury c/b subarachnoid hemorrhage, seizures switched to Keppra from Dilantin, and transaminitis who presented to the ED at [facility] with AMS, unable to follow commands and using inappropriate words. Neuro following, appreciate their recs. MRI brain with no acute intracranial process (low  quality exam), EEG showing diffuse encephalopathy LTM EEG canceled due to patient not being able to keep leads on (re-attempt will be done today), and a phenytoin level of <2.5.  Pt is afebrile, tachycardic and at times tachypneic. Saturating well at RA, and hypertensive. WBC has been WNL. Had a recent flea infestation and a new rash on her legs. Was hyponatremic, ETOH level <10. UDS unremarkable, no history of drug use per significant other. Has nystagmus on exam. Onset of confusion seems acute, was seen in clinic earlier in the morning prior to her admission with no AMS. Question intoxication vs korsakoff vs infectious vs post-ictal aphasia? Per significant other, her  post-ictal episodes in the past have not been like this. Was loaded with Keppra 1000mg IV in the ED yesterday. Remains stable. B12, TSH, acetaminophen levels WNL. HIV pending.  -Psych consult -Cw keppra -High dose IV thiamine   #Hx of Seizures  Per significant other, patient does not usually have this present after her seizures. Unsure about her compliance with her medications prior to presentation.  - Continue home keppra  - Follow up on Keppra level - Follow up on neurology recs  - Seizure precautions  - Ativan PRN for sustained seizures   #Alcohol use disorder  #Concern for EtOH withdrawal #Concern for Wernicke-Korsakoff encephalopathy Ethanol level < 10 ,hx of alcohol use disorder, unclear when she had her last drink.  The patient is tachycardic and hypertensive. Improved with a dose of Ativan yesterday. Odd behavior, perseverating on her name and not really responding to questions. Talking about the firefly show and at times mentioning how she was part of the show. Could be confabulating.   - CIWA with ativan -cw 500 mg IV thiamine TID x 2 days, followed by 250 mg IV thiamine BID x5 days -CTM RFP   #Hepatic steatosis - This patient may benefit from therapeutic support for alcohol use cessation   Protein-calorie malnutrition Weight loss Serum Osm is 290 does not appear volume depleted on exam. Sodium is now 137. - RD consult placed - Outpatient cancer screening   #Hx of Anxiety  - Can cw Hydroxyzine 10 mg    #Macrocytosis without anemia B12 381, normal. Cw folate supplementation.   #Petechial rash  Over BLE. Could be due to fleas but per significant other, he has not seen these before and could be new. Non blanching. Hepatitis panel negative. No tampon on exam. No fevers, WBC WNL. Could be a vasculitis vs anthropod bites.  - Monitor PLT, fever curve, and eruption progression   Best Practice: Diet: Regular diet VTE: enoxaparin (LOVENOX) injection 40 mg Start: 04/12/23  2245 SCDs Start: 04/12/23 2037 Code: Full Therapy Recs: Pending, DME: none Family Contact: significant other, pete, called and notified. DISPO: pending medical work-up   Signature: Washington Alexander-Savino,MD  Internal Medicine Resident, PGY-1 [facility]  Pager: [phone] 7:29 AM, 04/13/2023   Please contact the on call pager after 5 pm and on weekends at [phone].

## 2023-04-13 NOTE — Progress Notes (Addendum)
NEUROLOGY CONSULT FOLLOW UP NOTE   Date of service: April 13, 2023 Patient Name: Alyssa Little MRN:  [id number] DOB:  11/30/1980  Brief HPI  Alyssa Little is a 42 y.o. female who has a past medical history of Alcohol abuse, Hypokalemia (10/19/2022), SAH (subarachnoid hemorrhage), Seizure disorder, TBI (traumatic brain injury), and Transaminitis, who presented from home to the [facility] on Friday afternoon for evaluation after her husband found her to be extremely confused at 1300. On arrival to the ED she was struggling to follow commands and had inappropriate words. She was felt most likely to be postictal from an unwitnessed seizure at home. CT head revealed no acute abnormalities. Encephalomalacia was seen in the high anterior frontal lobes bilaterally, unchanged from the prior CT head dated September 19, 2022. Marland KitchenShe has a longstanding history of seizures. She is currently being switched over to Keppra from Dilantin, the latter being tapered. EDP consulted Dr. Amada Jupiter by telephone who recommended Keppra load and transfer to [facility] for LTM EEG. Keppra 1000 mg IV was loaded in the ED, but her "very atypical behaviors" continued. She was transferred to [facility] for EEG.    Interval Hx/subjective  There has been some improvement to her speech overnight, but still with severe receptive aphasia. She is currently with pressured, nearly constant speech about family and friends, and makes only limited eye contact with examiner while speaking. Now able to answer some questions, but only when asked about family and friends.   Vitals   Vitals:   04/13/23 0315 04/13/23 0330 04/13/23 0445 04/13/23 0721  BP: 134/77  117/84 (!) 135/108  Pulse: 98  77 94  Resp: 18  20 18   Temp:  97.9 F (36.6 C)  98.4 F (36.9 C)  TempSrc:    Oral  SpO2: 99%  100% 100%     There is no height or weight on file to calculate BMI.  Physical Exam   Physical Exam  HEENT:  [place]/AT Lungs: Respirations  unlabored Extremities: Warm and well perfused.    Neurological Examination Mental Status: Awake and alert. She again is unable to follow any verbal commands but can follow some pantomimed commands. There has been some improvement to her speech overnight, but still with severe receptive aphasia. She is currently with pressured, nearly constant speech about family and friends, and makes only limited eye contact with examiner while speaking. Now able to answer some questions, but only when asked about family and friends. She is cooperative and not agitated despite her pressured speech. No hemineglect noted.   Cranial Nerves: II: No reliable blink to threat on the left or right, but does make eye contact and attend to visual stimuli.    III,IV, VI: No ptosis. EOMI. No nystagmus.  VII: Face is symmetric VIII: Alerts to voice IX,X: No hoarseness or hypophonia XI: Symmetric XII: No lingual dysarthria Motor: Symmetric and unchanged since yesterday Sensory: Unchanged Deep Tendon Reflexes: 3+ and symmetric throughout Cerebellar: No ataxia with spontaneous movements. Unable to follow commands for definitive assessment Gait: Deferred  Medications  Current Facility-Administered Medications:    acetaminophen (TYLENOL) tablet 650 mg, 650 mg, Oral, Q6H PRN **OR** acetaminophen (TYLENOL) suppository 650 mg, 650 mg, Rectal, Q6H PRN, Morene Crocker, MD   enoxaparin (LOVENOX) injection 40 mg, 40 mg, Subcutaneous, Q24H, Gomez-Caraballo, Maria, MD, 40 mg at 04/13/23 0100   folic acid (FOLVITE) tablet 1 mg, 1 mg, Oral, Daily, Gomez-Caraballo, Maria, MD   lactulose [facility]) enema 200 gm, 300 mL, Rectal, Once, Gomez-Caraballo,  Byrd Hesselbach, MD   levETIRAcetam (KEPPRA) IVPB 1500 mg/ 100 mL premix, 1,500 mg, Intravenous, BID, Caryl Pina, MD, Stopped at 04/13/23 0421   LORazepam (ATIVAN) 2 MG/ML injection, , , ,    LORazepam (ATIVAN) injection 1 mg, 1 mg, Intravenous, Once, Morene Crocker, MD    LORazepam (ATIVAN) tablet 1-4 mg, 1-4 mg, Oral, Q1H PRN **OR** LORazepam (ATIVAN) injection 1-4 mg, 1-4 mg, Intravenous, Q1H PRN, Morene Crocker, MD, 2 mg at 04/12/23 2054   multivitamin with minerals tablet 1 tablet, 1 tablet, Oral, Daily, Morene Crocker, MD, 1 tablet at 04/12/23 2234   thiamine (VITAMIN B1) 500 mg in sodium chloride 0.9 % 50 mL IVPB, 500 mg, Intravenous, TID, Stopped at 04/12/23 2258 **FOLLOWED BY** [START ON 04/14/2023] thiamine (VITAMIN B1) 250 mg in sodium chloride 0.9 % 50 mL IVPB, 250 mg, Intravenous, BID, Morene Crocker, MD  Current Outpatient Medications:    Azelaic Acid 15 % gel, Apply 1 Application topically 2 (two) times daily. After skin is thoroughly washed and patted dry, gently but thoroughly massage a thin film of azelaic acid cream into the affected area twice daily, in the morning and evening., Disp: 50 g, Rfl: 0   folic acid (FOLVITE) 1 MG tablet, Take 1 tablet (1 mg total) by mouth daily., Disp: 30 tablet, Rfl: 2   hydrOXYzine (ATARAX) 10 MG tablet, Take 1 tablet (10 mg total) by mouth 3 (three) times daily as needed., Disp: , Rfl:    ibuprofen (ADVIL) 200 MG tablet, Take 200-400 mg by mouth every 6 (six) hours as needed for cramping or headache., Disp: , Rfl:    levETIRAcetam (KEPPRA XR) 500 MG 24 hr tablet, Take 6 tablets (3,000 mg total) by mouth at bedtime., Disp: 180 tablet, Rfl: 11   Multiple Vitamin (MULTIVITAMIN WITH MINERALS) TABS tablet, Take 1 tablet by mouth daily., Disp: , Rfl:    phenytoin (DILANTIN) 100 MG ER capsule, Take 3 capsules (300 mg total) by mouth at bedtime. (Patient taking differently: Take 100 mg by mouth at bedtime.), Disp: 90 capsule, Rfl: 3   TYLENOL 500 MG tablet, Take 500-1,000 mg by mouth every 6 (six) hours as needed for mild pain (or headaches)., Disp: , Rfl:   Labs and Diagnostic Imaging   CBC:  Recent Labs  Lab 04/12/23 1447  WBC 7.4  NEUTROABS 5.5  HGB 13.9  HCT 40.5  MCV 103.3*  PLT 318     Basic Metabolic Panel:  Lab Results  Component Value Date   NA 128 (L) 04/12/2023   K 3.7 04/12/2023   CO2 20 (L) 04/12/2023   GLUCOSE 116 (H) 04/12/2023   BUN <5 (L) 04/12/2023   CREATININE 0.52 04/12/2023   CALCIUM 8.3 (L) 04/12/2023   GFRNONAA >60 04/12/2023   GFRAA >60 04/09/2019   Lipid Panel: No results found for: "LDLCALC" HgbA1c: No results found for: "HGBA1C" Urine Drug Screen:     Component Value Date/Time   LABOPIA NONE DETECTED 04/13/2023 0015   COCAINSCRNUR NONE DETECTED 04/13/2023 0015   LABBENZ POSITIVE (A) 04/13/2023 0015   AMPHETMU NONE DETECTED 04/13/2023 0015   THCU NONE DETECTED 04/13/2023 0015   LABBARB NONE DETECTED 04/13/2023 0015    Alcohol Level     Component Value Date/Time   ETH <10 04/12/2023 1447   INR  Lab Results  Component Value Date   INR 1.0 04/13/2023   APTT  Lab Results  Component Value Date   APTT 31 11/07/2020   AED levels:  Lab Results  Component  Value Date   PHENYTOIN <2.5 (L) 04/12/2023   LEVETIRACETA <2.0 (L) 09/19/2022      Assessment  Alyssa Little is a 42 y.o. female who has a past medical history of Alcohol abuse, Hypokalemia (10/19/2022), SAH (subarachnoid hemorrhage) (HCC), Seizure disorder (HCC), TBI (traumatic brain injury) (HCC), and Transaminitis., who presented from home to the [facility] this afternoon for evaluation after her husband found her to be extremely confused at 1300. On arrival to the ED she was struggling to follow commands and had inappropriate words. She was felt to be postictal from an unwitnessed seizure at home. .She has a longstanding history of seizures. She is currently being switched over to Keppra from Dilantin, the latter being tapered. EDP consulted Dr. Amada Jupiter by telephone who recommended Keppra load and transfer to [facility] for LTM EEG. Keppra 1000 mg IV was loaded in the ED, but her "very atypical behaviors" continued. Home medications list includes phenytoin ER 300 mg at  bedtime, but in the context of her taper it is unclear if she is on a lower dose or now completely off this medication. Keppra XR 3000 mg at bedtime is listed on her home medications list as well.  - Exam reveals receptive aphasia, with slight improvement since yesterday, but still quite prominent. Her spontaneous speech is nearly constant and mildly pressured. She seems to be talking to no one in particular, but then will at times answer questions about the family/friends that she is nearly continuously rambling about. Some paraphasic errors are noted. No dysarthria. No motor deficits, facial droop or ocular motility deficit seen.    - CT head reveals no acute abnormalities. Encephalomalacia is seen in the high anterior frontal lobes bilaterally, unchanged from the prior CT head dated September 19, 2022.  - EEG: Continuous slow, generalized. This technically difficult study is suggestive of moderate diffuse encephalopathy. No seizures or epileptiform discharges were seen throughout the recording. - MRI brain: Evaluation is somewhat limited by motion artifact. Within this limitation, no acute intracranial process. No evidence of acute or subacute infarct. - Labs: - Phenytoin level < 2.5, consistent with having stopped this medication - Keppra level pending - Most likely etiology for her presentation is extended postictal aphasia; continuous slowing on EEG is compatible with this. EEG was negative for seizure activity, so partial complex status epilepticus is now unlikely. Stroke has been ruled out with MRI.      RECOMMENDATIONS  - IV Ativan PRN seizure  - Frequent neuro checks - Inpatient seizure precautions - Continue Keppra at 1500 mg IV BID  - Note: LTM was canceled yesterday night due to patient inability to keep leads on. We will make another attempt today to attach leads.  - Agree with starting high-dose empiric thiamine for possible acute thiamine deficiency given her chronic EtOH use.  - CIWA  protocol.   Addendum: - LTM EEG leads reattached - Per communication from Dr. Melynda Ripple, LTM EEG reveals continuous rhythmic slowing in the left hemisphere, maximal in the left frontotemporal region. No electrographic seizures seen, but technical quality of the study is compromised by patient pulling electrodes - Discontinuing LTM EEG     ______________________________________________________________________   Dessa Phi, Weaver Tweed, MD [facility]

## 2023-04-13 NOTE — Assessment & Plan Note (Signed)
Pt has macrocytosis on CBC. Her folic acid is slightly low. Will start repletion this visit. She is actively drinking alcohol which can lead to macrocytosis itself. Can repeat CBC as previously planned.

## 2023-04-13 NOTE — Assessment & Plan Note (Signed)
Pt's seizures have been well controlled with her current reigmen of Keppra 3000 mg every day and daily dilantin. She is to wean off the dilantin per neurology.

## 2023-04-13 NOTE — Evaluation (Signed)
Physical Therapy Evaluation Patient Details Name: Alyssa Little MRN: [id number] DOB: June 14, 1980 Today's Date: 04/13/2023  History of Present Illness  42 y.o. female admitted 12/27 with AMS, receptive aphasia. Past medical history of Alcohol abuse, Hypokalemia (10/19/2022), SAH (subarachnoid hemorrhage), Seizure disorder, TBI (traumatic brain injury), and Transaminitis.  Clinical Impression  Pt admitted with above diagnosis. Able to mobilize safely without evidence of impaired balance or weakness. Pt requires frequent cues to redirect and follow commands to navigate room and unit. Significant other present. Likely can arrange 24/7 care at home. Will update recs pending progress with cognitive status. Likely no PT indicated unless pt unsafe to return home from a cognitive standpoint. Pt currently with functional limitations due to the deficits listed below (see PT Problem List). Pt will benefit from acute skilled PT to increase their independence and safety with mobility to allow discharge.           If plan is discharge home, recommend the following: A little help with walking and/or transfers;Supervision due to cognitive status;Help with stairs or ramp for entrance;Assist for transportation;Direct supervision/assist for medications management;Direct supervision/assist for financial management;Assistance with cooking/housework;A little help with bathing/dressing/bathroom   Can travel by private vehicle        Equipment Recommendations None recommended by PT  Recommendations for Other Services       Functional Status Assessment Patient has had a recent decline in their functional status and demonstrates the ability to make significant improvements in function in a reasonable and predictable amount of time.     Precautions / Restrictions Precautions Precautions: Fall Restrictions Weight Bearing Restrictions Per Provider Order: No      Mobility  Bed Mobility Overal bed  mobility: Needs Assistance Bed Mobility: Supine to Sit, Sit to Supine     Supine to sit: Supervision Sit to supine: Supervision   General bed mobility comments: supervision for safety guard due to impulsivity. Pt did not require physical assist on/off the stretcher.    Transfers Overall transfer level: Modified independent Equipment used: None               General transfer comment: mod I to rise from edge of bed.    Ambulation/Gait Ambulation/Gait assistance: Supervision Gait Distance (Feet): 150 Feet Assistive device: None Gait Pattern/deviations: WFL(Within Functional Limits) Gait velocity: dec Gait velocity interpretation: 1.31 - 2.62 ft/sec, indicative of limited community ambulator   General Gait Details: Grossly symmetry gait with slightly reduced speed, no evidence of LOB. No assistive device used. Required cues for direction and to continue walking. Pt able to follow significant other's lead around unit. Easily distracted, often taking several verbal cues to direct towards goal.  Stairs            Wheelchair Mobility     Tilt Bed    Modified Rankin (Stroke Patients Only)       Balance Overall balance assessment: Modified Independent                                           Pertinent Vitals/Pain Pain Assessment Pain Assessment: PAINAD Breathing: normal Negative Vocalization: none Facial Expression: smiling or inexpressive Body Language: relaxed Consolability: no need to console PAINAD Score: 0 Pain Intervention(s): Monitored during session    Home Living Family/patient expects to be discharged to:: Private residence Living Arrangements: Spouse/significant other Available Help at Discharge: Family;Available 24 hours/day Type of Home:  House Home Access: Stairs to enter Entrance Stairs-Rails: Doctor, general practice of Steps: 5   Home Layout: Two level;Able to live on main level with bedroom/bathroom Home  Equipment: None      Prior Function Prior Level of Function : Independent/Modified Independent             Mobility Comments: ind, no falls ADLs Comments: ind, does not work or drive     Extremity/Trunk Assessment   Upper Extremity Assessment Upper Extremity Assessment: Defer to OT evaluation    Lower Extremity Assessment Lower Extremity Assessment: Difficult to assess due to impaired cognition       Communication   Communication Communication: Difficulty following commands/understanding;Difficulty communicating thoughts/reduced clarity of speech Following commands: Follows one step commands inconsistently Cueing Techniques: Verbal cues;Tactile cues;Visual cues  Cognition Arousal: Alert Behavior During Therapy: Restless, Impulsive Overall Cognitive Status: Difficult to assess                                 General Comments: Unable to answer questions, pt is internally distracted, speaking out of context.        General Comments General comments (skin integrity, edema, etc.): Significant other reports cognitive impairment seems about this same since yesterday.    Exercises     Assessment/Plan    PT Assessment Patient needs continued PT services  PT Problem List Decreased mobility;Decreased cognition;Decreased knowledge of use of DME;Decreased safety awareness;Decreased knowledge of precautions       PT Treatment Interventions Gait training;DME instruction;Stair training;Functional mobility training;Therapeutic activities;Therapeutic exercise;Balance training;Neuromuscular re-education;Cognitive remediation;Patient/family education    PT Goals (Current goals can be found in the Care Plan section)  Acute Rehab PT Goals Patient Stated Goal: none stated PT Goal Formulation: With patient/family Time For Goal Achievement: 04/26/23 Potential to Achieve Goals: Good    Frequency Min 1X/week     Co-evaluation               AM-PAC PT "6  Clicks" Mobility  Outcome Measure Help needed turning from your back to your side while in a flat bed without using bedrails?: None Help needed moving from lying on your back to sitting on the side of a flat bed without using bedrails?: None Help needed moving to and from a bed to a chair (including a wheelchair)?: None Help needed standing up from a chair using your arms (e.g., wheelchair or bedside chair)?: None Help needed to walk in hospital room?: A Little Help needed climbing 3-5 steps with a railing? : A Little 6 Click Score: 22    End of Session   Activity Tolerance: Patient tolerated treatment well Patient left: in bed;with call bell/phone within reach;with family/visitor present Nurse Communication: Mobility status PT Visit Diagnosis: Other symptoms and signs involving the nervous system (R29.898);Apraxia (R48.2)    Time: 1610-9604 PT Time Calculation (min) (ACUTE ONLY): 11 min   Charges:   PT Evaluation $PT Eval Low Complexity: 1 Low   PT General Charges $$ ACUTE PT VISIT: 1 Visit         Kathlyn Sacramento, PT, DPT [facility]  Rehabilitation Services Physical Therapist Office: [phone] Website: [email or web address]   Berton Mount 04/13/2023, 10:55 AM

## 2023-04-13 NOTE — ED Notes (Signed)
Patient found outside her room, patient reports concerns for someone in her room, no one seen in room at time of her return. Patient also observed responding to internal stimuli and having a conversation with an individual not present in the room.

## 2023-04-13 NOTE — Progress Notes (Signed)
CSW added substance abuse resources to patient's AVS.  Burnham Trost, MSW, LCSW Transitions of Care  Clinical Social Worker II [phone]  

## 2023-04-13 NOTE — Progress Notes (Signed)
LTM EEG disconnected per order from neuro team. All EEG leads were already ripped off by the patient prior to tech getting to the room to complete disconnect. This was the EEG teams second time hooking this patient up to vEEG monitoring.

## 2023-04-13 NOTE — Assessment & Plan Note (Signed)
Patient reports no recent panic attacks. States having hydroxyzine prn helps prevent panic attacks as she knows she has something to take if she gets anxious. Her GAD 7 today was 6. Offered therapy but pt states currently not interested but will let us know in the future.

## 2023-04-13 NOTE — ED Notes (Addendum)
Patient refused to wear cardiac monitor, labs, and medications, MD made aware.

## 2023-04-13 NOTE — Progress Notes (Signed)
Patient arrived to unit from ED. Patient disoriented x4 unable to safety rules at this time. Bed alarm on. Call bell given.

## 2023-04-13 NOTE — Assessment & Plan Note (Signed)
Pt with rosacea that has been long standing. She states she went to dermatology previously but they told her to use OTC cream. Discussed different treatment options and another dermatology referral to a different office. Will start pt on Azelaic acid and place dermatology referral.

## 2023-04-13 NOTE — Progress Notes (Signed)
LTM EEG hooked up and running - no initial skin breakdown - push button tested - [facility] NOT monitoring since pt is in ED.

## 2023-04-13 NOTE — ED Notes (Signed)
Pt well appearing upon transport to floor.

## 2023-04-13 NOTE — Progress Notes (Signed)
Pt not available at this time for LTM EEG h/u due to PT at bedside, Pt was combative and agitated to the point of LTM EEG aborted after hook up for STAT per last Tech earlier this AM.

## 2023-04-13 NOTE — Assessment & Plan Note (Signed)
Patient continues to drink beer daily. Discussed pt's liver health and alcohol's role in seizure but patient not interested in trying a medication for alcohol cessation at this time.

## 2023-04-13 NOTE — ED Notes (Signed)
Pt removed most EEG leads.

## 2023-04-13 NOTE — Assessment & Plan Note (Signed)
Repeat BP this visit 125/87 which is improved from last visit. Can continue to monitor at subsequent visits but does not meet criteria for hypertension.

## 2023-04-13 NOTE — ED Notes (Signed)
Pt tearful bc her significant other left to go to work.

## 2023-04-13 NOTE — Assessment & Plan Note (Signed)
Pt's weight is slowly improving. Her BMI is in the healthy range at 18.8. She has gained 2 lbs in 2 months. No red flag symptoms. Pt endorses good appetite and does not report night sweats, fevers or chills. Can continue to track her weight at subsequent visits. Discussed with patient the need to complete pap smear and pt deferred to next visit.

## 2023-04-13 NOTE — Consult Note (Addendum)
[facility] Psychiatric Consult Initial  Patient Name: .Alyssa Little  MRN: [id number]  DOB: 08/20/1980  Consult Order details:  Orders (From admission, onward)     Start     Ordered   04/13/23 1024  IP CONSULT TO PSYCHIATRY       Comments: Patient came in with acute AMS. Work-up negative for stroke, hx of seizures does not seem post-ictal, and alcohol use do, has nystagmus. Tangential not following commands. Perseverating on her different names  Ordering Provider: Manuela Neptune, MD  Provider:  (Not yet assigned)  Question Answer Comment  Location [facility]   Reason for Consult? AMS      04/13/23 1029             Mode of Visit: In person    Psychiatry Consult Evaluation  Service Date: April 13, 2023 LOS:  LOS: 0 days  Chief Complaint "I don't understand what you're doing here."  Primary Psychiatric Diagnoses  Acute onset psychosis 2.  Seizure disorder  3.  Alcohol use disorder   Assessment  Alyssa Little is a 42 y.o. female admitted: Presented to the EDfor 04/12/2023  2:16 PM for AMS. She has no prior psychiatric diagnoses and has a past medical history of Alcohol abuse, SAH (subarachnoid hemorrhage), Seizure disorder, TBI (traumatic brain injury).   Her current presentation of acute onset of inappropriate laughter,internal preoccupation, and tangential thought process is most consistent with acute psychotic disorder likely secondary to TBI. Patient is confused and unable to follow command which is consistent with post ictal state in a patient with seizure disorder. Patient is not currently taking any outpatient psychotropic medications and has never seen a psychiatrist in her entire life. On initial examination, patient appears confused,disorganized and is not oriented to time, place, person or situation.    Diagnoses:  Active Hospital problems: Principal Problem:   Acute encephalopathy Active Problems:   Seizures  (HCC)   Alcohol use disorder   TBI (traumatic brain injury) (HCC)   Protein-calorie malnutrition (HCC)   Macrocytosis without anemia    Plan   ## Psychiatric Medication Recommendations:  -Consider Risperidone 1 mg po start and 1 mg at bedtime from 04/14/23 -Continue Keppra 1500 mg twice daily for Seizure disorder  -Continue Thiamine, Folic acid and multivitamins.  -Continue other medical treatment    ## Medical Decision Making Capacity:  Patient is confused and cannot participate in capacity evaluation at this time.   ## Further Work-up:  Check B12, Folate, HIV, Syphilis, ANA.  -- most recent EKG on 04/12/23 had QtC of 461 -- Pertinent labwork reviewed earlier this admission includes:sodium-128, AST-91, ALT-62.    ## Disposition:-- Patient will benefit from outpatient psychiatric follow up upon discharge.   ## Behavioral / Environmental: -Delirium Precautions: Delirium Interventions for Nursing and Staff: - RN to open blinds every AM. - To Bedside: Glasses, hearing aide, and pt's own shoes. Make available to patients. when possible and encourage use. - Encourage po fluids when appropriate, keep fluids within reach. - OOB to chair with meals. - Passive ROM exercises to all extremities with AM & PM care. - RN to assess orientation to person, time and place QAM and PRN. - Recommend extended visitation hours with familiar family/friends as feasible. - Staff to minimize disturbances at night. Turn off television when pt asleep or when not in use.    ## Safety and Observation Level:  - Based on my clinical evaluation, I estimate the patient to be at mild  risk of self harm in the current setting. - At this time, we recommend  routine. This decision is based on my review of the chart including patient's history and current presentation, interview of the patient, mental status examination, and consideration of suicide risk including evaluating suicidal ideation, plan, intent, suicidal or  self-harm behaviors, risk factors, and protective factors. This judgment is based on our ability to directly address suicide risk, implement suicide prevention strategies, and develop a safety plan while the patient is in the clinical setting. Please contact our team if there is a concern that risk level has changed.  CSSR Risk Category:C-SSRS RISK CATEGORY: No Risk  Suicide Risk Assessment: Patient has following modifiable risk factors for suicide: medication noncompliance, which we are addressing by prescribing medication. Patient has following non-modifiable or demographic risk factors for suicide:  Patient has the following protective factors against suicide: Supportive family  Thank you for this consult request. Recommendations have been communicated to the primary team.  We will follow up at this time.   Fredonia Highland, MD       History of Present Illness  Relevant Aspects of Hospital ED Course:  Admitted on 04/12/2023 for AMS. Patient have past medical history of Alcohol abuse,SAH (subarachnoid hemorrhage), Seizure disorder, TBI (traumatic brain injury), and Transaminitis, who presented from home to the [facility] on Friday afternoon for evaluation after her husband found her to be extremely confused at 1300. On arrival to the ED she was struggling to follow commands and had inappropriate words. She was felt most likely to be postictal from an unwitnessed seizure at home. CT head revealed no acute abnormalities. Encephalomalacia was seen in the high anterior frontal lobes bilaterally, unchanged from the prior CT head dated September 19, 2022. Marland KitchenShe has a longstanding history of seizures. She is currently being switched over to Keppra from Dilantin, the latter being tapered.  Patient Report:  Patient seen face to face in her hospital room with her husband/partner at bedside. She is awake and alert but not oriented to time, place, person or situation. Patient is unable to participate in psychiatric  evaluation due to the ongoing confusion. Patient is tangential, disorganized, internally preoccupied, laughing inappropriately as if responding to internal stimuli. She is confused and does not know why she was brought to the ED.   Psych ROS:  Depression:unable to assess Anxiety:  unable to assess Mania (lifetime and current): unable to assess Psychosis: (lifetime and current): patient appears acutely psychotic due to ongoing disorganized behavior, tangential thought process, internal preoccupation, laughing inappropriately as if responding to inter stimuli.    Collateral information:  Collateral information from the patient's husband/partner at bedside Wendie Chess [phone]) indicates that patient suddenly became confused and was struggling to follow command at around noon on 04/12/23. He reports that patient was spaced out, not verbalizing, and did not understand his conversation. He denies any prior psychiatric diagnoses or ongoing psychotropic medication. He denies any prior psychotic symptoms including hallucination, paranoia or disorganized behavior. Partner equally denies depressive or anxiety symptoms. Partner/husband reports that patient has a Seizure disorder and drinks 3 beers of alcohol daily. She currently takes Keppra prescribed by her Neurologist and has been compliant. She used to take Dilantin but the Neurologist decided to switch her to Keppra recently.   ROS   Psychiatric and Social History  Psychiatric History:  Information collected from patient and husband.   Prev Dx/Sx: none  Current Psych Provider: none  Home Meds (current): none  Previous Med Trials:  none  Therapy: Partner reports patient used to see a therapist but is not sure if she received any psychiatric diagnoses.  Prior Psych Hospitalization: denies   Prior Self Harm: denies  Prior Violence: denies   Family Psych History: denies  Family Hx suicide: denies   Social History:  Developmental Hx: unsure   Educational Hx: unsure  Occupational Hx: unsure  Legal Hx: unsure Living Situation: Currently lives with partner Spiritual Hx: unsure Access to weapons/lethal means: denies    Substance History Alcohol: Yes. Drinks 3 beer bottle daily.   Type of alcohol Beer Last Drink Yesterday Number of drinks per day 3 bottles  History of alcohol withdrawal seizures. Yes History of DT's Unsure  Tobacco: denies  Illicit drugs: denies  Prescription drug abuse: denies  Rehab hx: denies   Exam Findings  Physical Exam: Vital Signs:  Temp:  [97.6 F (36.4 C)-98.4 F (36.9 C)] 98.2 F (36.8 C) (12/28 1443) Pulse Rate:  [77-104] 100 (12/28 1443) Resp:  [14-25] 18 (12/28 1443) BP: (111-150)/(77-120) 148/120 (12/28 1443) SpO2:  [99 %-100 %] 100 % (12/28 1443) Blood pressure (!) 148/120, pulse 100, temperature 98.2 F (36.8 C), temperature source Oral, resp. rate 18, SpO2 100%. There is no height or weight on file to calculate BMI.  Physical Exam  Mental Status Exam: General Appearance: Fairly Groomed  Orientation:  Negative  Memory:  Negative  Concentration:  Attention Span: Poor  Recall:  Poor  Attention  Poor  Eye Contact:  Poor  Speech:  Garbled  Language:  Poor  Volume:  Normal  Mood: "unable to assessed"  Affect:  Constricted  Thought Process:  Disorganized  Thought Content:  Illogical and Delusions  Suicidal Thoughts:  No  Homicidal Thoughts:  No  Judgement:  Poor  Insight:  Negative  Psychomotor Activity:  Normal  Akathisia:  Negative  Fund of Knowledge:  Poor      Assets:  Social Support  Cognition:  Impaired,  Moderate  ADL's:  Impaired  AIMS (if indicated):        Other History   These have been pulled in through the EMR, reviewed, and updated if appropriate.  Family History:  The patient's Family history is unknown by patient.  Medical History: Past Medical History:  Diagnosis Date   Alcohol abuse    Hypokalemia 10/19/2022   SAH (subarachnoid  hemorrhage) (HCC)    Seizure disorder (HCC)    TBI (traumatic brain injury) (HCC)    Transaminitis     Surgical History: Past Surgical History:  Procedure Laterality Date   CHOLECYSTECTOMY       Medications:   Current Facility-Administered Medications:    acetaminophen (TYLENOL) tablet 650 mg, 650 mg, Oral, Q6H PRN **OR** acetaminophen (TYLENOL) suppository 650 mg, 650 mg, Rectal, Q6H PRN, Morene Crocker, MD   enoxaparin (LOVENOX) injection 40 mg, 40 mg, Subcutaneous, Q24H, Gomez-Caraballo, Maria, MD, 40 mg at 04/13/23 0100   folic acid (FOLVITE) tablet 1 mg, 1 mg, Oral, Daily, Morene Crocker, MD, 1 mg at 04/13/23 0831   lactulose (CHRONULAC) enema 200 gm, 300 mL, Rectal, Once, Morene Crocker, MD   levETIRAcetam (KEPPRA) IVPB 1500 mg/ 100 mL premix, 1,500 mg, Intravenous, BID, Caryl Pina, MD, Stopped at 04/13/23 1023   LORazepam (ATIVAN) injection 1 mg, 1 mg, Intravenous, Once, Morene Crocker, MD   LORazepam (ATIVAN) tablet 1-4 mg, 1-4 mg, Oral, Q1H PRN **OR** LORazepam (ATIVAN) injection 1-4 mg, 1-4 mg, Intravenous, Q1H PRN, Morene Crocker, MD, 2 mg at 04/12/23 2054  multivitamin with minerals tablet 1 tablet, 1 tablet, Oral, Daily, Morene Crocker, MD, 1 tablet at 04/13/23 0831   thiamine (VITAMIN B1) 500 mg in sodium chloride 0.9 % 50 mL IVPB, 500 mg, Intravenous, TID, Stopped at 04/13/23 0908 **FOLLOWED BY** [START ON 04/14/2023] thiamine (VITAMIN B1) 250 mg in sodium chloride 0.9 % 50 mL IVPB, 250 mg, Intravenous, BID, Gomez-Caraballo, Maria, MD  Current Outpatient Medications:    Azelaic Acid 15 % gel, Apply 1 Application topically 2 (two) times daily. After skin is thoroughly washed and patted dry, gently but thoroughly massage a thin film of azelaic acid cream into the affected area twice daily, in the morning and evening., Disp: 50 g, Rfl: 0   folic acid (FOLVITE) 1 MG tablet, Take 1 tablet (1 mg total) by mouth daily., Disp: 30  tablet, Rfl: 2   hydrOXYzine (ATARAX) 10 MG tablet, Take 1 tablet (10 mg total) by mouth 3 (three) times daily as needed., Disp: , Rfl:    ibuprofen (ADVIL) 200 MG tablet, Take 200-400 mg by mouth every 6 (six) hours as needed for cramping or headache., Disp: , Rfl:    levETIRAcetam (KEPPRA XR) 500 MG 24 hr tablet, Take 6 tablets (3,000 mg total) by mouth at bedtime., Disp: 180 tablet, Rfl: 11   Multiple Vitamin (MULTIVITAMIN WITH MINERALS) TABS tablet, Take 1 tablet by mouth daily., Disp: , Rfl:    phenytoin (DILANTIN) 100 MG ER capsule, Take 3 capsules (300 mg total) by mouth at bedtime. (Patient taking differently: Take 100 mg by mouth at bedtime.), Disp: 90 capsule, Rfl: 3   TYLENOL 500 MG tablet, Take 500-1,000 mg by mouth every 6 (six) hours as needed for mild pain (or headaches)., Disp: , Rfl:   Allergies: Allergies  Allergen Reactions   Shellfish Allergy Anaphylaxis   Flagyl [Metronidazole] Hives   Penicillins Hives, Rash and Other (See Comments)    Tolerated cephalosporins including ancef  Has patient had a PCN reaction causing immediate rash, facial/tongue/throat swelling, SOB or lightheadedness with hypotension: Unknown Has patient had a PCN reaction causing severe rash involving mucus membranes or skin necrosis: Unknown Has patient had a PCN reaction that required hospitalization: Unknown Has patient had a PCN reaction occurring within the last 10 years: Unknown If all of the above answers are "NO", then may    Fredonia Highland, MD

## 2023-04-13 NOTE — Assessment & Plan Note (Addendum)
Currently not smoking. States it has been 6 months.

## 2023-04-13 NOTE — ED Notes (Signed)
MD at bedside speaking with patient.

## 2023-04-14 DIAGNOSIS — G9340 Encephalopathy, unspecified: Secondary | ICD-10-CM | POA: Diagnosis not present

## 2023-04-14 LAB — RENAL FUNCTION PANEL
Albumin: 2.8 g/dL — ABNORMAL LOW (ref 3.5–5.0)
Anion gap: 10 (ref 5–15)
BUN: <5 mg/dL — ABNORMAL LOW (ref 6–20)
CO2: 21 mmol/L — ABNORMAL LOW (ref 22–32)
Calcium: 8.5 mg/dL — ABNORMAL LOW (ref 8.9–10.3)
Chloride: 105 mmol/L (ref 98–111)
Creatinine, Ser: 0.58 mg/dL (ref 0.44–1.00)
GFR, Estimated: >60 mL/min (ref >60)
Glucose, Bld: 81 mg/dL (ref 70–99)
Phosphorus: 3.7 mg/dL (ref 2.5–4.6)
Potassium: 3 mmol/L — ABNORMAL LOW (ref 3.5–5.1)
Sodium: 136 mmol/L (ref 135–145)

## 2023-04-14 LAB — CBC
HCT: 35.8 % — ABNORMAL LOW (ref 36.0–46.0)
Hemoglobin: 12 g/dL (ref 12.0–15.0)
MCH: 34.7 pg — ABNORMAL HIGH (ref 26.0–34.0)
MCHC: 33.5 g/dL (ref 30.0–36.0)
MCV: 103.5 fL — ABNORMAL HIGH (ref 80.0–100.0)
Platelets: 265 10*3/uL (ref 150–400)
RBC: 3.46 MIL/uL — ABNORMAL LOW (ref 3.87–5.11)
RDW: 12.6 % (ref 11.5–15.5)
WBC: 6.8 10*3/uL (ref 4.0–10.5)
nRBC: 0 % (ref 0.0–0.2)

## 2023-04-14 MED ORDER — RISPERIDONE 1 MG/ML PO SOLN
1.0000 mg | Freq: Two times a day (BID) | ORAL | Status: DC
  Filled 2023-04-14 (×2): qty 1

## 2023-04-14 MED ORDER — LEVETIRACETAM 500 MG PO TABS
1500.0000 mg | ORAL_TABLET | Freq: Two times a day (BID) | ORAL | Status: DC
  Administered 2023-04-14 – 2023-04-18 (×9): 1500 mg via ORAL
  Filled 2023-04-14 (×9): qty 3

## 2023-04-14 MED ORDER — RISPERIDONE 1 MG PO TABS
1.0000 mg | ORAL_TABLET | Freq: Two times a day (BID) | ORAL | Status: DC
  Administered 2023-04-14 – 2023-04-18 (×9): 1 mg via ORAL
  Filled 2023-04-14 (×10): qty 1

## 2023-04-14 MED ORDER — POTASSIUM CHLORIDE CRYS ER 20 MEQ PO TBCR
40.0000 meq | EXTENDED_RELEASE_TABLET | Freq: Two times a day (BID) | ORAL | Status: AC
  Administered 2023-04-14 (×2): 40 meq via ORAL
  Filled 2023-04-14 (×2): qty 2

## 2023-04-14 MED ORDER — PHENYTOIN 50 MG PO CHEW
200.0000 mg | CHEWABLE_TABLET | Freq: Every day | ORAL | Status: DC
  Administered 2023-04-14 – 2023-04-17 (×4): 200 mg via ORAL
  Filled 2023-04-14 (×4): qty 4

## 2023-04-14 NOTE — Evaluation (Signed)
Occupational Therapy Evaluation Patient Details Name: Alyssa Little MRN: [id number] DOB: Nov 28, 1980 Today's Date: 04/14/2023   History of Present Illness 42 y.o. female admitted 12/27 with AMS, receptive aphasia. Past medical history of Alcohol abuse, Hypokalemia (10/19/2022), SAH (subarachnoid hemorrhage), Seizure disorder, TBI (traumatic brain injury), and Transaminitis.   Clinical Impression   Pt currently at supervision level for transfers and mobility without assistive device.  Decreased consistency with following one and multi step commands and with completing functional tasks such as brushing her teeth, or putting on socks.  Pt with frequent laughing throughout session without reason.  Unable to answer any questions regarding PLOF secondary to receptive/expressive difficulties.  Unsure if these are baseline at this time secondary to family not being present.  Pt with increased HR with activity increasing from 110 in supine up to 142 with ambulation.  Feel pt will benefit from acute care OT to help continue working on cognition and safety through completion of ADL activities.  Recommend 24 hour supervision at discharge and follow-up HHOT eval for safety.         If plan is discharge home, recommend the following: Assistance with cooking/housework;Direct supervision/assist for financial management;Direct supervision/assist for medications management;Assist for transportation;Supervision due to cognitive status    Functional Status Assessment  Patient has had a recent decline in their functional status and demonstrates the ability to make significant improvements in function in a reasonable and predictable amount of time.  Equipment Recommendations  None recommended by OT       Precautions / Restrictions Precautions Precautions: Fall Precaution Comments: global aphasia Restrictions Weight Bearing Restrictions Per Provider Order: No      Mobility Bed Mobility Overal bed  mobility: Independent Bed Mobility: Supine to Sit                Transfers Overall transfer level: Needs assistance Equipment used: None Transfers: Sit to/from Stand, Bed to chair/wheelchair/BSC Sit to Stand: Supervision     Step pivot transfers: Supervision            Balance Overall balance assessment: No apparent balance deficits (not formally assessed)                                         ADL either performed or assessed with clinical judgement   ADL Overall ADL's : Needs assistance/impaired Eating/Feeding: Independent;Sitting                   Lower Body Dressing: Minimal assistance Lower Body Dressing Details (indicate cue type and reason): For donning gripper socks.  Pt would not initiate task and needed max demonstrational cueing to complete secondary to cognitive deficits Toilet Transfer: Supervision/safety;Ambulation Toilet Transfer Details (indicate cue type and reason): simulated, pt declined need to toilet         Functional mobility during ADLs: Supervision/safety (no assistive device) General ADL Comments: Pt inconsistent with following commands.  Able to follow basic ones regarding lifting arms, squeezing therapist's hand but not able to follow multi step like "brush your teeth, walk out the door, put on your socks."  Pt hysterically laughing at times when asked questions.  Elevated HR noted ranging from 110 at rest up to 142 BPM with activity.  Nursing made aware.  Oxygen sats 94% or greater on room air.     Vision Baseline Vision/History: 1 Wears glasses Ability to See in Adequate Light: 0  Adequate Patient Visual Report: Other (comment) (pt unable to state) Vision Assessment?: Wears glasses for reading Additional Comments: Pt with glasses in place but not noted if vision is different from baseline secondary to expressive/receptive deficits.     Perception Perception: Not tested       Praxis Praxis: Not tested        Pertinent Vitals/Pain Pain Assessment Pain Assessment: Faces Faces Pain Scale: No hurt     Extremity/Trunk Assessment Upper Extremity Assessment Upper Extremity Assessment: Difficult to assess due to impaired cognition (grip WFLS, AROM shoulder flexion WFL)   Lower Extremity Assessment Lower Extremity Assessment: Defer to PT evaluation   Cervical / Trunk Assessment Cervical / Trunk Assessment: Normal   Communication Communication Communication: Difficulty following commands/understanding;Difficulty communicating thoughts/reduced clarity of speech Following commands: Follows one step commands inconsistently Cueing Techniques: Gestural cues;Tactile cues   Cognition Arousal: Alert Behavior During Therapy:  (frequent laughing throughout session) Overall Cognitive Status: Difficult to assess                                 General Comments: Pt inconsistent with understanding and following commands.  No family present and pt unable to answer questions when asked.  She would reply but not with an inteligible response related to the question, or she would just laugh.  She was able to follow simple one step commands such as "raise your left arm" or "wiggle your fingers" but not abstract or mutli step like "here put on your socks".                Home Living Family/patient expects to be discharged to:: Private residence Living Arrangements: Spouse/significant other Available Help at Discharge: Family;Available 24 hours/day Type of Home: House Home Access: Stairs to enter [employer] of Steps: 5 Entrance Stairs-Rails: Right;Left Home Layout: Two level;Able to live on main level with bedroom/bathroom     Bathroom Shower/Tub: Chief Strategy Officer: Standard Bathroom Accessibility: Yes   Home Equipment: None          Prior Functioning/Environment Prior Level of Function : Independent/Modified Independent             Mobility  Comments: ind, no falls ADLs Comments: ind, does not work or drive        OT Problem List: Decreased cognition;Decreased safety awareness      OT Treatment/Interventions: Self-care/ADL training;DME and/or AE instruction;Patient/family education;Therapeutic activities    OT Goals(Current goals can be found in the care plan section) Acute Rehab OT Goals Patient Stated Goal: Pt did not state but agreeable to participation in OT session. OT Goal Formulation: Patient unable to participate in goal setting Time For Goal Achievement: 04/28/23 Potential to Achieve Goals: Good  OT Frequency: Min 1X/week       AM-PAC OT "6 Clicks" Daily Activity     Outcome Measure Help from another person eating meals?: None Help from another person taking care of personal grooming?: A Little Help from another person toileting, which includes using toliet, bedpan, or urinal?: A Little Help from another person bathing (including washing, rinsing, drying)?: A Little Help from another person to put on and taking off regular upper body clothing?: A Little Help from another person to put on and taking off regular lower body clothing?: A Little 6 Click Score: 19   End of Session Nurse Communication: Mobility status;Other (comment) (elevated HR)  Activity Tolerance: Patient tolerated treatment well Patient  left: in bed;with call bell/phone within reach;with bed alarm set  OT Visit Diagnosis: Other symptoms and signs involving cognitive function;Cognitive communication deficit (R41.841) Symptoms and signs involving cognitive functions: Nontraumatic intracerebral hemorrhage                Time: 6045-4098 OT Time Calculation (min): 18 min Charges:  OT General Charges $OT Visit: 1 Visit OT Evaluation $OT Eval Moderate Complexity: 1 Mod  Perrin Maltese, OTR/L Acute Rehabilitation Services  Office [phone] 04/14/2023

## 2023-04-14 NOTE — Progress Notes (Addendum)
HD#1 SUBJECTIVE:  Patient Summary: Alyssa Little is a 42 y.o. with a pertinent PMH of alcohol use do, traumatic brain injury c/b subarachnoid hemorrhage, seizures switched to Keppra from Dilantin, and transaminitis who presented to the ED at [facility] with AMS, unable to follow commands and using inappropriate words. MRI brain with no acute intracranial process (low quality exam), EEG showing diffuse encephalopathy LTM EEG canceled, and a phenytoin level of <2.5 admitted for acute encephalopathy.   Overnight Events: LTM EEG canceled as she was pulling leads. Speaking to herself no one in the room.  Interim History:  Pt still confused.  OBJECTIVE:  Vital Signs: Vitals:   04/13/23 1907 04/14/23 0453 04/14/23 0743 04/14/23 0803  BP: (!) 138/109 114/89 112/84 112/85  Pulse: 91 99 91 93  Resp:  18 16 18   Temp: 98.3 F (36.8 C) 98.1 F (36.7 C) 98.1 F (36.7 C)   TempSrc: Oral Oral Oral   SpO2: 100% 100% 100% 100%   Supplemental O2: Room Air SpO2: 100 %   Intake/Output Summary (Last 24 hours) at 04/14/2023 1139 Last data filed at 04/14/2023 1025 Gross per 24 hour  Intake 410 ml  Output --  Net 410 ml   Net IO Since Admission: 607.73 mL [04/14/23 1139]  Physical Exam: Physical Exam Constitutional:      General: She is not in acute distress.    Appearance: She is not ill-appearing.  Eyes:     Conjunctiva/sclera: Conjunctivae normal.     Pupils: Pupils are equal, round, and reactive to light.  Cardiovascular:     Rate and Rhythm: Normal rate and regular rhythm.     Heart sounds: No murmur heard.    No friction rub. No gallop.  Pulmonary:     Effort: Pulmonary effort is normal. No respiratory distress.     Breath sounds: No wheezing, rhonchi or rales.  Abdominal:     General: Abdomen is flat. There is no distension.     Palpations: Abdomen is soft.     Tenderness: There is no abdominal tenderness. There is no guarding or rebound.  Skin:    General: Skin is warm  and dry.  Neurological:     Mental Status: She is alert. She is disoriented.     Cranial Nerves: No cranial nerve deficit.  Psychiatric:        Attention and Perception: She is inattentive.        Mood and Affect: Affect is inappropriate.        Speech: Speech is tangential.        Behavior: Behavior is cooperative.    Patient Lines/Drains/Airways Status     Active Line/Drains/Airways     Name Placement date Placement time Site Days   Peripheral IV 04/12/23 20 G Anterior;Proximal;Right Forearm 04/12/23  1439  Forearm  1   Wound / Incision (Open or Dehisced) 02/24/18 Laceration Head Lateral;Right;Upper 02/24/18  2305  Head  1874            ASSESSMENT/PLAN:  Assessment: Principal Problem:   Acute encephalopathy Active Problems:   Seizures (HCC)   Alcohol use disorder   TBI (traumatic brain injury) (HCC)   Protein-calorie malnutrition (HCC)   Macrocytosis without anemia  Plan:  #Acute Psychosis #Hx of Seizures, post ictal state Appreciate Psychiatry and Neurology recs. Pt is likely having acute psychosis likely 2/2 TBI and confused due to her post-ictal state. EEG consistent with post-ictal state. Patient's mentation is improving today, oriented to name and DOB, not  perseverating on different names. At times would be confused on location but then state she is at [facility]. She does state that she was at home and was drinking after she saw [name] in clinic. She also states that she may have fallen. Does have a bruise in her right arm. She is not at baseline, but will continue to monitor progress.   -Cw Keppra 1,500mg  po BID -Cw Risperidone 1mg  po at bedtime  -cw high dose thiamine  -cw folic acid supplementation -ANA, RPR, folate - Seizure precautions  -Delirium precautions -frequent neuro checks - Ativan PRN for sustained seizures   #Alcohol use disorder  #Concern for EtOH withdrawal #Concern for Wernicke-Korsakoff encephalopathy CIWa scores at 15 yesterday. CTM.  -CIWA  with ativan -cw 500 mg IV thiamine TID x 2 days, followed by 250 mg IV thiamine BID x5 days -CTM RFP   #Hepatic steatosis - This patient may benefit from therapeutic support for alcohol use cessation   Protein-calorie malnutrition Weight loss -RD consult placed - Outpatient cancer screening   #Hx of Anxiety  - Can cw Hydroxyzine 10 mg    #Macrocytosis without anemia B12 381, normal. Check folate levels. Cw folate supplementation.   #Petechial rash  Over BLE. Could be due to fleas but per significant other, he has not seen these before and could be new. Non blanching. Hepatitis panel negative. No tampon on exam. No fevers, WBC WNL. Could be a vasculitis vs anthropod bites.  - Monitor PLT, fever curve, and eruption progression   Best Practice: Diet: Regular diet VTE: enoxaparin (LOVENOX) injection 40 mg Start: 04/12/23 2245 Code: Full Therapy Recs: Pending, DME: none Family Contact: significant other, pete, called and notified. DISPO: pending medical work-up   Signature: Washington Alexander-Savino,MD  Internal Medicine Resident, PGY-1 [facility]  Pager: [phone] 11:39 AM, 04/14/2023   Please contact the on call pager after 5 pm and on weekends at [phone].

## 2023-04-14 NOTE — Progress Notes (Addendum)
Mobility Specialist Progress Note:    04/14/23 1130  Mobility  Activity Stood at bedside;Ambulated independently in room;Dangled on edge of bed  Level of Assistance Standby assist, set-up cues, supervision of patient - no hands on  Assistive Device None  Distance Ambulated (ft) 10 ft  Activity Response Tolerated well  Mobility Referral Yes  Mobility visit 1 Mobility  Mobility Specialist Start Time (ACUTE ONLY) 1125  Mobility Specialist Stop Time (ACUTE ONLY) 1130  Mobility Specialist Time Calculation (min) (ACUTE ONLY) 5 min   Responded to pt's bed alarm, pt received ambulating in room seemingly confused. Redirected pt back to bed w/o fault. Tolerated well, asx throughout. Bed alarm on, call bell in reach, left with all needs met.   Feliciana Rossetti Mobility Specialist Please contact via Special educational needs teacher or  Rehab office at [phone]

## 2023-04-14 NOTE — Plan of Care (Signed)

## 2023-04-14 NOTE — Progress Notes (Signed)
NEUROLOGY CONSULT FOLLOW UP NOTE   Date of service: April 14, 2023 Patient Name: Alyssa Little MRN:  [id number] DOB:  February 04, 1981  Brief Review of HPI  42 y.o. female who has a past medical history of Alcohol abuse, Hypokalemia (10/19/2022), SAH (subarachnoid hemorrhage), Seizure disorder, TBI (traumatic brain injury), and Transaminitis, who presented from home to the [facility] on Friday afternoon for evaluation after her boyfriend found her to be "extremely confused at 1300". He actually noted a decline over several hours rather than abruptly, beginning with mild confusion and language difficulties and culminating in complete word salad with inability to communicate ideas or consistently follow instructions. On arrival to the ED she was struggling to follow commands and had inappropriate words. She was felt most likely to be postictal from an unwitnessed seizure at home. CT head revealed no acute abnormalities. Encephalomalacia was seen in the high anterior frontal lobes bilaterally, unchanged from the prior CT head dated September 19, 2022. Marland KitchenShe has a longstanding history of seizures. She is currently being switched over to Keppra from Dilantin, the latter being tapered. Dilantin was previously at 300 mg every day, then changed to 200 mg recently. Patient and boyrfriend are uncertain if Dilantin was at 200 or 100 mg every day at the time of the above event. Keppra 1000 mg IV was loaded in the ED, but her "very atypical behaviors" continued. She was transferred to [facility] for EEG. She has been gradually improving here, initially with severe receptive aphasia, now with mild to moderate receptive aphasia.   Boyfriend states that she started having seizures about 6 years ago. They are typically GTC seizures. She has had only one prior episode resembling her current presentation with receptive aphasia, which had lasted about 24-48 hours previously. Patient and boyfriend state that this happened a few years ago.     Interval Hx/subjective  Continues to improve, but still with some speech deficits.   Vitals   Vitals:   04/13/23 1907 04/14/23 0453 04/14/23 0743 04/14/23 0803  BP: (!) 138/109 114/89 112/84 112/85  Pulse: 91 99 91 93  Resp:  18 16 18   Temp: 98.3 F (36.8 C) 98.1 F (36.7 C) 98.1 F (36.7 C) 98.2 F (36.8 C)  TempSrc: Oral Oral Oral   SpO2: 100% 100% 100% 100%     There is no height or weight on file to calculate BMI.  Physical Exam   HEENT:  [place]/AT Lungs: Respirations unlabored Extremities: Warm and well perfused.    Neurological Examination Mental Status: Awake and alert. Today she is fully oriented. Her speech is significantly improved, but still with relatively frequent tangentiality and several paraphasic errors along with some word finding difficulties and word substitutions. Speech is with normal tone and prosodic content, as well as fluent, in the context of the above. Now able to answer more questions than yesterday, no longer restricted to the subject of family and friends as she was yesterday.  She is cooperative and not agitated despite her pressured speech. No hemineglect noted. Now able to follow all motor commands without comprehension deficits to some, as was the case yesterday.  Cranial Nerves: II, III,IV, VI: Fixates and tracks normally. EOMI. Pupils are equal.     VII: Face is symmetric VIII: Intact to conversation IX,X: No hoarseness or hypophonia XI: Symmetric XII: No lingual dysarthria Motor: Normal strength. Symmetric and unchanged since yesterday Cerebellar: No ataxia with FNF bilaterally Gait: Deferred  Medications  Current Facility-Administered Medications:    acetaminophen (TYLENOL) tablet  650 mg, 650 mg, Oral, Q6H PRN **OR** acetaminophen (TYLENOL) suppository 650 mg, 650 mg, Rectal, Q6H PRN, Morene Crocker, MD   enoxaparin (LOVENOX) injection 40 mg, 40 mg, Subcutaneous, Q24H, Morene Crocker, MD, 40 mg at 04/13/23 2026    folic acid (FOLVITE) tablet 1 mg, 1 mg, Oral, Daily, Morene Crocker, MD, 1 mg at 04/14/23 1049   lactulose (CHRONULAC) enema 200 gm, 300 mL, Rectal, Once, Morene Crocker, MD   levETIRAcetam (KEPPRA) tablet 1,500 mg, 1,500 mg, Oral, BID, Alexander-Savino, Washington, MD, 1,500 mg at 04/14/23 1049   LORazepam (ATIVAN) tablet 1-4 mg, 1-4 mg, Oral, Q1H PRN **OR** LORazepam (ATIVAN) injection 1-4 mg, 1-4 mg, Intravenous, Q1H PRN, Morene Crocker, MD, 2 mg at 04/14/23 0417   multivitamin with minerals tablet 1 tablet, 1 tablet, Oral, Daily, Morene Crocker, MD, 1 tablet at 04/14/23 1049   potassium chloride SA (KLOR-CON M) CR tablet 40 mEq, 40 mEq, Oral, BID, Alexander-Savino, Washington, MD, 40 mEq at 04/14/23 1049   risperiDONE (RISPERDAL) tablet 1 mg, 1 mg, Oral, BID, Alexander-Savino, Washington, MD, 1 mg at 04/14/23 1358   thiamine (VITAMIN B1) 500 mg in sodium chloride 0.9 % 50 mL IVPB, 500 mg, Intravenous, TID, Last Rate: 110 mL/hr at 04/14/23 1610, 500 mg at 04/14/23 1610 **FOLLOWED BY** thiamine (VITAMIN B1) 250 mg in sodium chloride 0.9 % 50 mL IVPB, 250 mg, Intravenous, BID, Morene Crocker, MD [facility] and Diagnostic Imaging   CBC:  Recent Labs  Lab 04/12/23 1447 04/14/23 0510  WBC 7.4 6.8  NEUTROABS 5.5  --   HGB 13.9 12.0  HCT 40.5 35.8*  MCV 103.3* 103.5*  PLT 318 265    Basic Metabolic Panel:  Lab Results  Component Value Date   NA 136 04/14/2023   K 3.0 (L) 04/14/2023   CO2 21 (L) 04/14/2023   GLUCOSE 81 04/14/2023   BUN <5 (L) 04/14/2023   CREATININE 0.58 04/14/2023   CALCIUM 8.5 (L) 04/14/2023   GFRNONAA >60 04/14/2023   GFRAA >60 04/09/2019   Lipid Panel: No results found for: "LDLCALC" HgbA1c: No results found for: "HGBA1C" Urine Drug Screen:     Component Value Date/Time   LABOPIA NONE DETECTED 04/13/2023 0015   COCAINSCRNUR NONE DETECTED 04/13/2023 0015   LABBENZ POSITIVE (A) 04/13/2023 0015   AMPHETMU NONE DETECTED 04/13/2023  0015   THCU NONE DETECTED 04/13/2023 0015   LABBARB NONE DETECTED 04/13/2023 0015    Alcohol Level     Component Value Date/Time   ETH <10 04/12/2023 1447   INR  Lab Results  Component Value Date   INR 1.0 04/13/2023   APTT  Lab Results  Component Value Date   APTT 31 11/07/2020   AED levels:  Lab Results  Component Value Date   PHENYTOIN <2.5 (L) 04/12/2023   LEVETIRACETA <2.0 (L) 09/19/2022    Assessment  Brealynn Tarni Alias is a 42 y.o. female with PMHx of TBI, seizures, on Keppra 3000 mg per day and Dilantin, the latter currently being tapered off to minimize long term side effects, who presented on Friday with acute onset of receptive aphasia. .  - Exam is significantly improved today, but still with some speech deficits.     - Imaging: - CT head revealed no acute abnormalities. Encephalomalacia was seen in the high anterior frontal lobes bilaterally, unchanged from the prior CT head dated September 19, 2022.  - MRI brain: Evaluation is somewhat limited by motion artifact. Within this limitation, no acute intracranial process. No evidence of acute  or subacute infarct. Old bilateral high anterior frontal lobe contusions are seen.  - LTM EEG report (04/13/2023 1155 to 1524): Continuous slow, left hemisphere, maximal left temporal region. This technically difficult study is suggestive of cortical dysfunction arising from left hemisphere, maximal left temporal region likely secondary to underlying structural abnormality, post-ictal state. No seizures or epileptiform discharges were seen throughout the recording. - LTM EEG was discontinued yesterday -- Labs: - Phenytoin level < 2.5, consistent with having stopped this medication - Keppra level pending - Most likely etiology for her presentation is extended postictal aphasia; continuous slowing on EEG is compatible with this. EEG was negative for seizure activity, so partial complex status epilepticus is now unlikely. Stroke has  been ruled out with MRI.      RECOMMENDATIONS  - IV Ativan PRN seizure  - Frequent neuro checks - Inpatient seizure precautions - Continue Keppra at 1500 mg IV BID  - Restart Dilantin at 200 mg po every day. Taper to be completed outpatient.  - Continue high-dose empiric thiamine for a total of 9 doses, then switch to 100 mg po thiamine every day. Treatment is for possible acute thiamine deficiency given her chronic EtOH use, but doubt that this is the etiology for her presentation.  - CIWA protocol.  - Outpatient seizure precautions: Per [facility] statutes, patients with seizures are not allowed to drive until  they have been seizure-free for six months. Use caution when using heavy equipment or power tools. Avoid working on ladders or at heights. Take showers instead of baths. Ensure the water temperature is not too high on the home water heater. Do not go swimming alone. When caring for infants or small children, sit down when holding, feeding, or changing them to minimize risk of injury to the child in the event you have a seizure. Also, Maintain good sleep hygiene. Avoid alcohol. - Once speech is fully back to baseline, can discharge home with outpatient Neurology follow up.  - Neurohospitalist service will follow PRN. Please call if there are additional questions.    SignedOtelia Limes Oluwatobi Ruppe, MD [facility]

## 2023-04-14 NOTE — Procedures (Signed)
Patient Name: Alyssa Little  MRN: [id number]  Epilepsy Attending: Charlsie Quest  Referring Physician/Provider: Caryl Pina, MD  Duration: 04/13/2023 1155 to 1524  Patient history: 42yo F with ams getting eeg to evaluate for seizure   Level of alertness: Awake   AEDs during EEG study: Ativan   Technical aspects: This EEG study was done with scalp electrodes positioned according to the 10-20 International system of electrode placement. Electrical activity was reviewed with band pass filter of 1-70Hz , sensitivity of 7 uV/mm, display speed of 106mm/sec with a 60Hz  notched filter applied as appropriate. EEG data were recorded continuously and digitally stored.  Video monitoring was available and reviewed as appropriate.   Description: The posterior dominant rhythm consists of 9 Hz activity of moderate voltage (25-35 uV) seen predominantly in posterior head regions, asymmetric ( left<right) and reactive to eye opening and eye closing. EEG showed continuous 3 to 5 Hz theta-delta slowing in left hemisphere, maximal left temporal region. Hyperventilation and photic stimulation were not performed.      Of note, eeg was technically difficult due to significant myogenic artifact.   ABNORMALITY - Continuous slow, left hemisphere, maximal left temporal region  IMPRESSION: This technically difficult study is suggestive of cortical dysfunction arising from left hemisphere, maximal left temporal region likely secondary to underlying structural abnormality, post-ictal state. No seizures or epileptiform discharges were seen throughout the recording.  Quiera Diffee Annabelle Harman

## 2023-04-14 NOTE — Consult Note (Signed)
[facility] Psychiatric Consult Follow-up  Patient Name: .Alyssa Little  MRN: [id number]  DOB: 22-Feb-1981  Consult Order details:  Orders (From admission, onward)     Start     Ordered   04/13/23 1024  IP CONSULT TO PSYCHIATRY       Comments: Patient came in with acute AMS. Work-up negative for stroke, hx of seizures does not seem post-ictal, and alcohol use do, has nystagmus. Tangential not following commands. Perseverating on her different names  Ordering Provider: Manuela Neptune, MD  Provider:  (Not yet assigned)  Question Answer Comment  Location [facility]   Reason for Consult? AMS      04/13/23 1029             Mode of Visit: In person    Psychiatry Consult Evaluation  Service Date: April 14, 2023 LOS:  LOS: 1 day  Chief Complaint "I don't understand what you're doing here."  Primary Psychiatric Diagnoses  Acute onset psychosis 2.  Seizure disorder  3.  Alcohol use disorder   Assessment  Alyssa Little is a 42 y.o. female admitted: Presented to the EDfor 04/12/2023  2:16 PM for AMS. She has no prior psychiatric diagnoses and has a past medical history of Alcohol abuse, SAH (subarachnoid hemorrhage), Seizure disorder, TBI (traumatic brain injury).   Her current presentation of acute onset of inappropriate laughter,internal preoccupation, and tangential thought process is most consistent with acute psychotic disorder likely secondary to TBI. Patient is confused and unable to follow command which is consistent with post ictal state in a patient with seizure disorder. Patient is not currently taking any outpatient psychotropic medications and has never seen a psychiatrist in her entire life. On initial examination, patient appears confused,disorganized and is not oriented to time, place, person or situation.   04/14/23: Patient seen face to face in her hospital room. She is alert and oriented x 2 and appears calm and  cooperative today. Patient reports she is in [place] and believes the month is December, however patient keeps repeating the year is 2004 even when corrected multiple times, saying "I think something is wrong with my brain." She remains tangential, disorganized, and unable to follow command. However, there seems to be some improvement from yesterday when patient could not have any conversation with this Clinical research associate. Collateral information from the patient's treating nurse indicates that patient was up at night and was paranoid about someone in her room. However, she denies aggressive or irritable behavior. Risperidone will be increased to 1 mg twice daily.      Diagnoses:  Active Hospital problems: Principal Problem:   Acute encephalopathy Active Problems:   Seizures (HCC)   Alcohol use disorder   TBI (traumatic brain injury) (HCC)   Protein-calorie malnutrition (HCC)   Macrocytosis without anemia    Plan   ## Psychiatric Medication Recommendations:  -Increase Risperidone to 1 mg twice daily from today 04/14/23 -Continue Keppra 1500 mg twice daily for Seizure disorder  -Continue Thiamine, Folic acid and multivitamins.  -Continue other medical treatment  -Psychiatric consult service will continue to follow up.    ## Medical Decision Making Capacity:  Patient is confused and cannot participate in capacity evaluation at this time.   ## Further Work-up:  Check B12, Folate, HIV, Syphilis, ANA.  -- most recent EKG on 04/12/23 had QtC of 461 -- Pertinent labwork reviewed earlier this admission includes:sodium-128, AST-91, ALT-62.  Note: Brain MRI is unremarkable.   ## Disposition:-- Patient will benefit from  outpatient psychiatric follow up upon discharge.   ## Behavioral / Environmental: -Delirium Precautions: Delirium Interventions for Nursing and Staff: - RN to open blinds every AM. - To Bedside: Glasses, hearing aide, and pt's own shoes. Make available to patients. when possible and  encourage use. - Encourage po fluids when appropriate, keep fluids within reach. - OOB to chair with meals. - Passive ROM exercises to all extremities with AM & PM care. - RN to assess orientation to person, time and place QAM and PRN. - Recommend extended visitation hours with familiar family/friends as feasible. - Staff to minimize disturbances at night. Turn off television when pt asleep or when not in use.    ## Safety and Observation Level:  - Based on my clinical evaluation, I estimate the patient to be at mild risk of self harm in the current setting. - At this time, we recommend  routine. This decision is based on my review of the chart including patient's history and current presentation, interview of the patient, mental status examination, and consideration of suicide risk including evaluating suicidal ideation, plan, intent, suicidal or self-harm behaviors, risk factors, and protective factors. This judgment is based on our ability to directly address suicide risk, implement suicide prevention strategies, and develop a safety plan while the patient is in the clinical setting. Please contact our team if there is a concern that risk level has changed.  CSSR Risk Category:C-SSRS RISK CATEGORY: No Risk  Suicide Risk Assessment: Patient has following modifiable risk factors for suicide: medication noncompliance, which we are addressing by prescribing medication. Patient has following non-modifiable or demographic risk factors for suicide:  Patient has the following protective factors against suicide: Supportive family  Thank you for this consult request. Recommendations have been communicated to the primary team.  We will follow up at this time.   Fredonia Highland, MD       History of Present Illness  Relevant Aspects of Hospital ED Course:  Admitted on 04/12/2023 for AMS. Patient have past medical history of Alcohol abuse,SAH (subarachnoid hemorrhage), Seizure disorder, TBI (traumatic  brain injury), and Transaminitis, who presented from home to the [facility] on Friday afternoon for evaluation after her husband found her to be extremely confused at 1300. On arrival to the ED she was struggling to follow commands and had inappropriate words. She was felt most likely to be postictal from an unwitnessed seizure at home. CT head revealed no acute abnormalities. Encephalomalacia was seen in the high anterior frontal lobes bilaterally, unchanged from the prior CT head dated September 19, 2022. Marland KitchenShe has a longstanding history of seizures. She is currently being switched over to Keppra from Dilantin, the latter being tapered.  Patient Report:  Patient seen face to face in her hospital room with her husband/partner at bedside. She is awake and alert but not oriented to time, place, person or situation. Patient is unable to participate in psychiatric evaluation due to the ongoing confusion. Patient is tangential, disorganized, internally preoccupied, laughing inappropriately as if responding to internal stimuli. She is confused and does not know why she was brought to the ED.   Psych ROS:  Depression:unable to assess Anxiety:  unable to assess Mania (lifetime and current): unable to assess Psychosis: (lifetime and current): patient appears acutely psychotic due to ongoing disorganized behavior, tangential thought process, internal preoccupation, laughing inappropriately as if responding to inter stimuli.    Collateral information:  Collateral information from the patient's husband/partner at bedside Wendie Chess [phone]) indicates that patient  suddenly became confused and was struggling to follow command at around noon on 04/12/23. He reports that patient was spaced out, not verbalizing, and did not understand his conversation. He denies any prior psychiatric diagnoses or ongoing psychotropic medication. He denies any prior psychotic symptoms including hallucination, paranoia or disorganized behavior.  Partner equally denies depressive or anxiety symptoms. Partner/husband reports that patient has a Seizure disorder and drinks 3 beers of alcohol daily. She currently takes Keppra prescribed by her Neurologist and has been compliant. She used to take Dilantin but the Neurologist decided to switch her to Keppra recently.   ROS   Psychiatric and Social History  Psychiatric History:  Information collected from patient and husband.   Prev Dx/Sx: none  Current Psych Provider: none  Home Meds (current): none  Previous Med Trials: none  Therapy: Partner reports patient used to see a therapist but is not sure if she received any psychiatric diagnoses.  Prior Psych Hospitalization: denies   Prior Self Harm: denies  Prior Violence: denies   Family Psych History: denies  Family Hx suicide: denies   Social History:  Developmental Hx: unsure  Educational Hx: unsure  Occupational Hx: unsure  Legal Hx: unsure Living Situation: Currently lives with partner Spiritual Hx: unsure Access to weapons/lethal means: denies    Substance History Alcohol: Yes. Drinks 3 beer bottle daily.   Type of alcohol Beer Last Drink Yesterday Number of drinks per day 3 bottles  History of alcohol withdrawal seizures. Yes History of DT's Unsure  Tobacco: denies  Illicit drugs: denies  Prescription drug abuse: denies  Rehab hx: denies   Exam Findings  Physical Exam: Vital Signs:  Temp:  [98.1 F (36.7 C)-98.3 F (36.8 C)] 98.2 F (36.8 C) (12/29 0803) Pulse Rate:  [85-99] 93 (12/29 0803) Resp:  [16-20] 18 (12/29 0803) BP: (112-143)/(84-109) 112/85 (12/29 0803) SpO2:  [100 %] 100 % (12/29 0803) Blood pressure 112/85, pulse 93, temperature 98.2 F (36.8 C), resp. rate 18, SpO2 100%. There is no height or weight on file to calculate BMI.  Physical Exam  Mental Status Exam: General Appearance: Fairly Groomed  Orientation:  Negative  Memory:  Negative  Concentration:  Attention Span: Poor  Recall:   Poor  Attention  Poor  Eye Contact:  Poor  Speech:  Garbled  Language:  Poor  Volume:  Normal  Mood: "unable to assessed"  Affect:  Constricted  Thought Process:  Disorganized  Thought Content:  Illogical and Delusions  Suicidal Thoughts:  No  Homicidal Thoughts:  No  Judgement:  Poor  Insight:  Negative  Psychomotor Activity:  Normal  Akathisia:  Negative  Fund of Knowledge:  Poor      Assets:  Social Support  Cognition:  Impaired,  Moderate  ADL's:  Impaired  AIMS (if indicated):        Other History   These have been pulled in through the EMR, reviewed, and updated if appropriate.  Family History:  The patient's Family history is unknown by patient.  Medical History: Past Medical History:  Diagnosis Date   Alcohol abuse    Hypokalemia 10/19/2022   SAH (subarachnoid hemorrhage) (HCC)    Seizure disorder (HCC)    TBI (traumatic brain injury) (HCC)    Transaminitis     Surgical History: Past Surgical History:  Procedure Laterality Date   CHOLECYSTECTOMY       Medications:   Current Facility-Administered Medications:    acetaminophen (TYLENOL) tablet 650 mg, 650 mg, Oral, Q6H PRN **OR**  acetaminophen (TYLENOL) suppository 650 mg, 650 mg, Rectal, Q6H PRN, Morene Crocker, MD   enoxaparin (LOVENOX) injection 40 mg, 40 mg, Subcutaneous, Q24H, Morene Crocker, MD, 40 mg at 04/13/23 2026   folic acid (FOLVITE) tablet 1 mg, 1 mg, Oral, Daily, Morene Crocker, MD, 1 mg at 04/14/23 1049   lactulose (CHRONULAC) enema 200 gm, 300 mL, Rectal, Once, Morene Crocker, MD   levETIRAcetam (KEPPRA) tablet 1,500 mg, 1,500 mg, Oral, BID, Alexander-Savino, [place], MD, 1,500 mg at 04/14/23 1049   LORazepam (ATIVAN) tablet 1-4 mg, 1-4 mg, Oral, Q1H PRN **OR** LORazepam (ATIVAN) injection 1-4 mg, 1-4 mg, Intravenous, Q1H PRN, Morene Crocker, MD, 2 mg at 04/14/23 0417   multivitamin with minerals tablet 1 tablet, 1 tablet, Oral, Daily,  Morene Crocker, MD, 1 tablet at 04/14/23 1049   potassium chloride SA (KLOR-CON M) CR tablet 40 mEq, 40 mEq, Oral, BID, Alexander-Savino, Washington, MD, 40 mEq at 04/14/23 1049   risperiDONE (RISPERDAL) tablet 1 mg, 1 mg, Oral, BID, Alexander-Savino, Washington, MD, 1 mg at 04/14/23 1358   thiamine (VITAMIN B1) 500 mg in sodium chloride 0.9 % 50 mL IVPB, 500 mg, Intravenous, TID, Last Rate: 110 mL/hr at 04/14/23 1053, 500 mg at 04/14/23 1053 **FOLLOWED BY** thiamine (VITAMIN B1) 250 mg in sodium chloride 0.9 % 50 mL IVPB, 250 mg, Intravenous, BID, Morene Crocker, MD  Allergies: Allergies  Allergen Reactions   Shellfish Allergy Anaphylaxis   Flagyl [Metronidazole] Hives   Penicillins Hives, Rash and Other (See Comments)    Tolerated cephalosporins including ancef  Has patient had a PCN reaction causing immediate rash, facial/tongue/throat swelling, SOB or lightheadedness with hypotension: Unknown Has patient had a PCN reaction causing severe rash involving mucus membranes or skin necrosis: Unknown Has patient had a PCN reaction that required hospitalization: Unknown Has patient had a PCN reaction occurring within the last 10 years: Unknown If all of the above answers are "NO", then may    Fredonia Highland, MD

## 2023-04-15 ENCOUNTER — Other Ambulatory Visit (HOSPITAL_COMMUNITY): Payer: Self-pay

## 2023-04-15 DIAGNOSIS — G9340 Encephalopathy, unspecified: Secondary | ICD-10-CM | POA: Diagnosis not present

## 2023-04-15 DIAGNOSIS — E440 Moderate protein-calorie malnutrition: Secondary | ICD-10-CM

## 2023-04-15 LAB — CBC
HCT: 34 % — ABNORMAL LOW (ref 36.0–46.0)
Hemoglobin: 11.2 g/dL — ABNORMAL LOW (ref 12.0–15.0)
MCH: 34.1 pg — ABNORMAL HIGH (ref 26.0–34.0)
MCHC: 32.9 g/dL (ref 30.0–36.0)
MCV: 103.7 fL — ABNORMAL HIGH (ref 80.0–100.0)
Platelets: 234 10*3/uL (ref 150–400)
RBC: 3.28 MIL/uL — ABNORMAL LOW (ref 3.87–5.11)
RDW: 12.5 % (ref 11.5–15.5)
WBC: 6.8 10*3/uL (ref 4.0–10.5)
nRBC: 0 % (ref 0.0–0.2)

## 2023-04-15 LAB — RENAL FUNCTION PANEL
Albumin: 2.6 g/dL — ABNORMAL LOW (ref 3.5–5.0)
Anion gap: 7 (ref 5–15)
BUN: <5 mg/dL — ABNORMAL LOW (ref 6–20)
CO2: 20 mmol/L — ABNORMAL LOW (ref 22–32)
Calcium: 8.5 mg/dL — ABNORMAL LOW (ref 8.9–10.3)
Chloride: 110 mmol/L (ref 98–111)
Creatinine, Ser: 0.48 mg/dL (ref 0.44–1.00)
GFR, Estimated: >60 mL/min (ref >60)
Glucose, Bld: 92 mg/dL (ref 70–99)
Phosphorus: 3.5 mg/dL (ref 2.5–4.6)
Potassium: 3.9 mmol/L (ref 3.5–5.1)
Sodium: 137 mmol/L (ref 135–145)

## 2023-04-15 LAB — FOLATE: Folate: 18.9 ng/mL (ref >5.9)

## 2023-04-15 LAB — RPR: RPR Ser Ql: NONREACTIVE

## 2023-04-15 LAB — LEVETIRACETAM LEVEL: Levetiracetam Lvl: 40.8 ug/mL — ABNORMAL HIGH (ref 10.0–40.0)

## 2023-04-15 MED ORDER — THIAMINE HCL 100 MG/ML IJ SOLN
500.0000 mg | Freq: Two times a day (BID) | INTRAVENOUS | Status: DC
  Administered 2023-04-15 (×2): 500 mg via INTRAVENOUS
  Filled 2023-04-15 (×4): qty 5

## 2023-04-15 MED ORDER — ENSURE ENLIVE PO LIQD
237.0000 mL | Freq: Two times a day (BID) | ORAL | Status: DC
  Administered 2023-04-15 – 2023-04-16 (×2): 237 mL via ORAL

## 2023-04-15 NOTE — Progress Notes (Addendum)
HD#2 SUBJECTIVE:  Patient Summary: Alyssa Little is a 42 y.o. with a pertinent PMH of alcohol use do, traumatic brain injury c/b subarachnoid hemorrhage, seizures switched to Keppra from Dilantin, and transaminitis who presented to the ED at [facility] with AMS, unable to follow commands and using inappropriate words. MRI brain with no acute intracranial process (low quality exam), EEG showing diffuse encephalopathy LTM EEG canceled, and a phenytoin level of <2.5 admitted for acute encephalopathy.   Overnight Events: NAEO  Interim History:  Remains confused but improving. Knows she is in the hospital. Knows she is confused.   OBJECTIVE:  Vital Signs: Vitals:   04/14/23 1953 04/15/23 0025 04/15/23 0436 04/15/23 0739  BP: 110/88 106/86 (!) 123/91 118/86  Pulse: 96 97 98 86  Resp: 18 18 18    Temp: 98.5 F (36.9 C) 97.6 F (36.4 C) 98 F (36.7 C) 97.7 F (36.5 C)  TempSrc: Oral Oral Oral Oral  SpO2: 100% 97% 100% 100%   Supplemental O2: Room Air SpO2: 100 %   Intake/Output Summary (Last 24 hours) at 04/15/2023 1158 Last data filed at 04/15/2023 1013 Gross per 24 hour  Intake 172.5 ml  Output --  Net 172.5 ml   Net IO Since Admission: 780.23 mL [04/15/23 1158]  Physical Exam: Physical Exam Constitutional:      General: She is not in acute distress.    Appearance: She is not ill-appearing.  Eyes:     Conjunctiva/sclera: Conjunctivae normal.     Pupils: Pupils are equal, round, and reactive to light.  Cardiovascular:     Rate and Rhythm: Normal rate and regular rhythm.     Heart sounds: No murmur heard.    No friction rub. No gallop.  Pulmonary:     Effort: Pulmonary effort is normal. No respiratory distress.     Breath sounds: No wheezing, rhonchi or rales.  Abdominal:     General: Abdomen is flat. There is no distension.     Palpations: Abdomen is soft.     Tenderness: There is no abdominal tenderness. There is no guarding or rebound.  Skin:    General:  Skin is warm and dry.  Neurological:     Mental Status: She is alert. She is disoriented.     Cranial Nerves: No cranial nerve deficit.  Psychiatric:        Attention and Perception: She is inattentive.        Mood and Affect: Affect is inappropriate.        Speech: Speech is tangential.        Behavior: Behavior is cooperative.    Patient Lines/Drains/Airways Status     Active Line/Drains/Airways     Name Placement date Placement time Site Days   Peripheral IV 04/12/23 20 G Anterior;Proximal;Right Forearm 04/12/23  1439  Forearm  1   Wound / Incision (Open or Dehisced) 02/24/18 Laceration Head Lateral;Right;Upper 02/24/18  2305  Head  1874            ASSESSMENT/PLAN:  Assessment: Principal Problem:   Acute encephalopathy Active Problems:   Seizures (HCC)   Alcohol use disorder   TBI (traumatic brain injury) (HCC)   Protein-calorie malnutrition (HCC)   Macrocytosis without anemia   Malnutrition of moderate degree  Plan:  #Acute Psychosis #Hx of Seizures, post ictal state Patient oriented to name and DOB, knows it is December and that she is in the hospital. This is an improvement from the last two days. She states she is feeling  better and nods when she is told that she is responding to internal stimuli. Appreciate psychiatry and neurology.   -Cw Keppra 1,500mg  po BID -Cw Risperidone 1mg  po increased to BID -cw high dose thiamine for 8 more doses -cw folic acid supplementation -ANA, RPR, folate - Seizure precautions  -Delirium precautions -frequent neuro checks - Ativan PRN for sustained seizures   #Alcohol use disorder  #Concern for EtOH withdrawal #Concern for Wernicke-Korsakoff encephalopathy CIWa scores coming down from yesterday. CTM.  -CIWA with ativan -cw high dose IV thiamine -CTM RFP   #Hepatic steatosis - This patient may benefit from therapeutic support for alcohol use cessation   Protein-calorie malnutrition Weight loss -Continue current  Reg diet -Continue MVM -Ensure Plus High Protein po BID, each supplement provides 350 kcal and 20 grams of protein. - Outpatient cancer screening   #Hx of Anxiety  - Can cw Hydroxyzine 10 mg    #Macrocytosis without anemia B12 381, normal. Folate WNL. Could be that macrocytosis is caused by alcohol.    #Petechial rash  Over BLE. Could be due to fleas but per significant other, he has not seen these before and could be new. Non blanching. Hepatitis panel negative. No tampon on exam. No fevers, WBC WNL. Could be a vasculitis vs anthropod bites.  - Monitor PLT, fever curve, and eruption progression  Best Practice: Diet: Regular diet VTE: enoxaparin (LOVENOX) injection 40 mg Start: 04/12/23 2245 Code: Full Therapy Recs: Pending, DME: none Family Contact: significant other, pete, called and notified. DISPO: pending medical work-up   Signature: [place] Alexander-Savino,MD  Internal Medicine Resident, PGY-1 [facility]  Pager: [phone] 11:58 AM, 04/15/2023   Please contact the on call pager after 5 pm and on weekends at [phone].

## 2023-04-15 NOTE — Progress Notes (Signed)
Physical Therapy Treatment and Discharge  Patient Details Name: Alyssa Little MRN: [id number] DOB: Oct 10, 1980 Today's Date: 04/15/2023   History of Present Illness 42 y.o. female admitted 12/27 with AMS, receptive aphasia. Past medical history of Alcohol abuse, Hypokalemia (10/19/2022), SAH (subarachnoid hemorrhage), Seizure disorder, TBI (traumatic brain injury), and Transaminitis.    PT Comments  Pt continues to have confusion and aphasia. From a mobility standpoint she has met her goals and is close to her baseline level of function as she is mod I for bed mobility, transfers and ambulation without AD and supervision for navigating steps. Pt readily discusses need for more exercise and better nutrition, reporting that she would like to get back to yoga, and walking. Encouraged pt to work with Network engineer while here. Pt has no further acute or home PT needs at this time. Please reorder if patient needs change.     If plan is discharge home, recommend the following: A little help with walking and/or transfers;Supervision due to cognitive status;Help with stairs or ramp for entrance;Assist for transportation;Direct supervision/assist for medications management;Direct supervision/assist for financial management;Assistance with cooking/housework;A little help with bathing/dressing/bathroom   Can travel by private vehicle      Yes  Equipment Recommendations  None recommended by PT       Precautions / Restrictions Precautions Precautions: Fall Precaution Comments: global aphasia Restrictions Weight Bearing Restrictions Per Provider Order: No     Mobility  Bed Mobility Overal bed mobility: Modified Independent Bed Mobility: Supine to Sit     Supine to sit: Modified independent (Device/Increase time) Sit to supine: Modified independent (Device/Increase time)   General bed mobility comments: HoB elevated and minor use of bedrails    Transfers Overall transfer  level: Modified independent Equipment used: None Transfers: Sit to/from Stand, Bed to chair/wheelchair/BSC Sit to Stand: Modified independent (Device/Increase time)           General transfer comment: good power up, reaches to rail to self steady initially    Ambulation/Gait Ambulation/Gait assistance: Modified independent (Device/Increase time) Gait Distance (Feet): 450 Feet Assistive device: None Gait Pattern/deviations: WFL(Within Functional Limits) Gait velocity: dec Gait velocity interpretation: 1.31 - 2.62 ft/sec, indicative of limited community ambulator   General Gait Details: slowed, steady gait, able to manage long skirt so as not to trip over it, better focus today and carries on a conversation about getting a dog to walk to create a need to increase her walking   Stairs Stairs: Yes Stairs assistance: Supervision Stair Management: One rail Left, Alternating pattern, Forwards Number of Stairs: 8 General stair comments: pt with strong, ascent/descent of steps with a step over step pattern       Balance Overall balance assessment: No apparent balance deficits (not formally assessed)                                          Cognition Arousal: Alert Behavior During Therapy:  (frequent laughing throughout session) Overall Cognitive Status: Difficult to assess                                 General Comments: continues to laugh at questions, to mask her difficulty with word finding, improved command follow, requires repeated commands for multistep tasks           General Comments General comments (skin  integrity, edema, etc.): significant other Cindee Lame present, assisting with ordering lunch      Pertinent Vitals/Pain Pain Assessment Pain Assessment: No/denies pain Faces Pain Scale: No hurt Breathing: normal Negative Vocalization: none Facial Expression: smiling or inexpressive Body Language: relaxed Consolability: no need to  console PAINAD Score: 0     PT Goals (current goals can now be found in the care plan section) Acute Rehab PT Goals Patient Stated Goal: none stated PT Goal Formulation: With patient/family Time For Goal Achievement: 04/26/23 Potential to Achieve Goals: Good Progress towards PT goals: Goals met/education completed, patient discharged from PT    Frequency    Min 1X/week       AM-PAC PT "6 Clicks" Mobility   Outcome Measure  Help needed turning from your back to your side while in a flat bed without using bedrails?: None Help needed moving from lying on your back to sitting on the side of a flat bed without using bedrails?: None Help needed moving to and from a bed to a chair (including a wheelchair)?: None Help needed standing up from a chair using your arms (e.g., wheelchair or bedside chair)?: None Help needed to walk in hospital room?: A Little Help needed climbing 3-5 steps with a railing? : A Little 6 Click Score: 22    End of Session   Activity Tolerance: Patient tolerated treatment well Patient left: in bed;with call bell/phone within reach;with family/visitor present Nurse Communication: Mobility status PT Visit Diagnosis: Other symptoms and signs involving the nervous system (R29.898);Apraxia (R48.2)     Time: 8295-6213 PT Time Calculation (min) (ACUTE ONLY): 16 min  Charges:    $Gait Training: 8-22 mins PT General Charges $$ ACUTE PT VISIT: 1 Visit                     Decklin Weddington B. Beverely Risen PT, DPT Acute Rehabilitation Services Please use secure chat or  Call Office [phone]    Elon Alas Fleet 04/15/2023, 1:01 PM

## 2023-04-15 NOTE — Plan of Care (Signed)
  Problem: Education: Goal: Knowledge of General Education information will improve Description: Including pain rating scale, medication(s)/side effects and non-pharmacologic comfort measures Outcome: Progressing   Problem: Health Behavior/Discharge Planning: Goal: Ability to manage health-related needs will improve Outcome: Progressing   Problem: Clinical Measurements: Goal: Ability to maintain clinical measurements within normal limits will improve Outcome: Progressing Goal: Will remain free from infection Outcome: Progressing Goal: Diagnostic test results will improve Outcome: Progressing Goal: Respiratory complications will improve Outcome: Progressing Goal: Cardiovascular complication will be avoided Outcome: Progressing   Problem: Activity: Goal: Risk for activity intolerance will decrease Outcome: Progressing   Problem: Nutrition: Goal: Adequate nutrition will be maintained Outcome: Progressing   Problem: Coping: Goal: Level of anxiety will decrease Outcome: Progressing   Problem: Pain Management: Goal: General experience of comfort will improve Outcome: Progressing   Problem: Safety: Goal: Ability to remain free from injury will improve Outcome: Progressing   Problem: Skin Integrity: Goal: Risk for impaired skin integrity will decrease Outcome: Progressing   Problem: Health Behavior/Discharge Planning: Goal: Compliance with prescribed medication regimen will improve Outcome: Progressing   Problem: Medication: Goal: Risk for medication side effects will decrease Outcome: Progressing   Problem: Clinical Measurements: Goal: Complications related to the disease process, condition or treatment will be avoided or minimized Outcome: Progressing Goal: Diagnostic test results will improve Outcome: Progressing   Problem: Safety: Goal: Verbalization of understanding the information provided will improve Outcome: Progressing   Problem: Self-Concept: Goal:  Level of anxiety will decrease Outcome: Progressing Goal: Ability to verbalize feelings about condition will improve Outcome: Progressing

## 2023-04-15 NOTE — Progress Notes (Signed)
Internal Medicine Clinic Attending  Case discussed with the resident at the time of the visit.  We reviewed the resident's history and exam and pertinent patient test results.  I agree with the assessment, diagnosis, and plan of care documented in the resident's note.  

## 2023-04-15 NOTE — Progress Notes (Signed)
Transition of Care [facility]) - Inpatient Brief Assessment   Patient Details  Name: Alyssa Little MRN: [id number] Date of Birth: 13-May-1980  Transition of Care [facility]) CM/SW Contact:    Janae Bridgeman, RN Phone Number: 04/15/2023, 3:20 PM   Clinical Narrative: Transition of Care [facility]) - Inpatient Brief Assessment   Patient Details  Name: Alyssa Little MRN: [id number] Date of Birth: Jun 29, 1980  Transition of Care [facility]) CM/SW Contact:    Janae Bridgeman, RN Phone Number: 04/15/2023, 3:21 PM   Clinical Narrative: Patient admitted for Acute Metabolic encephalopathy and history of ETOH abuse.  TOC Team will continue to follow the patient for [facility] needs as patient progresses medically.   Transition of Care Asessment: Insurance and Status: (P) Insurance coverage has been reviewed Patient has primary care physician: (P) Yes Home environment has been reviewed: (P) from home with spouse   Prior/Current Home Services: (P) No current home services Social Drivers of Health Review: (P) SDOH reviewed needs interventions Readmission risk has been reviewed: (P) Yes Transition of care needs: (P) transition of care needs identified, TOC will continue to follow    Transition of Care Asessment: Insurance and Status: (P) Insurance coverage has been reviewed Patient has primary care physician: (P) Yes Home environment has been reviewed: (P) from home with spouse   Prior/Current Home Services: (P) No current home services Social Drivers of Health Review: (P) SDOH reviewed needs interventions Readmission risk has been reviewed: (P) Yes Transition of care needs: (P) transition of care needs identified, TOC will continue to follow

## 2023-04-15 NOTE — Plan of Care (Signed)
  Problem: Education: Goal: Knowledge of General Education information will improve Description: Including pain rating scale, medication(s)/side effects and non-pharmacologic comfort measures Outcome: Progressing   Problem: Health Behavior/Discharge Planning: Goal: Ability to manage health-related needs will improve Outcome: Progressing   Problem: Clinical Measurements: Goal: Ability to maintain clinical measurements within normal limits will improve Outcome: Progressing Goal: Will remain free from infection Outcome: Progressing Goal: Diagnostic test results will improve Outcome: Progressing Goal: Respiratory complications will improve Outcome: Progressing Goal: Cardiovascular complication will be avoided Outcome: Progressing   Problem: Activity: Goal: Risk for activity intolerance will decrease Outcome: Progressing   Problem: Nutrition: Goal: Adequate nutrition will be maintained Outcome: Progressing   Problem: Coping: Goal: Level of anxiety will decrease Outcome: Progressing   Problem: Elimination: Goal: Will not experience complications related to bowel motility Outcome: Progressing Goal: Will not experience complications related to urinary retention Outcome: Progressing   Problem: Pain Management: Goal: General experience of comfort will improve Outcome: Progressing   Problem: Safety: Goal: Ability to remain free from injury will improve Outcome: Progressing   Problem: Skin Integrity: Goal: Risk for impaired skin integrity will decrease Outcome: Progressing   Problem: Education: Goal: Expressions of having a comfortable level of knowledge regarding the disease process will increase Outcome: Progressing   Problem: Coping: Goal: Ability to adjust to condition or change in health will improve Outcome: Progressing Goal: Ability to identify appropriate support needs will improve Outcome: Progressing   Problem: Health Behavior/Discharge Planning: Goal:  Compliance with prescribed medication regimen will improve Outcome: Progressing   Problem: Medication: Goal: Risk for medication side effects will decrease Outcome: Progressing   Problem: Clinical Measurements: Goal: Complications related to the disease process, condition or treatment will be avoided or minimized Outcome: Progressing Goal: Diagnostic test results will improve Outcome: Progressing   Problem: Safety: Goal: Verbalization of understanding the information provided will improve Outcome: Progressing   Problem: Self-Concept: Goal: Level of anxiety will decrease Outcome: Progressing Goal: Ability to verbalize feelings about condition will improve Outcome: Progressing

## 2023-04-15 NOTE — Progress Notes (Signed)
Initial Nutrition Assessment  DOCUMENTATION CODES:   Non-severe (moderate) malnutrition in context of social or environmental circumstances  INTERVENTION:  Continue current Reg diet Continue MVM Ensure Plus High Protein po BID, each supplement provides 350 kcal and 20 grams of protein.    NUTRITION DIAGNOSIS:   Moderate Malnutrition related to social / environmental circumstances (ETOH abuse) as evidenced by mild fat depletion, moderate muscle depletion.    GOAL:   Patient will meet greater than or equal to 90% of their needs    MONITOR:   PO intake, Supplement acceptance  REASON FOR ASSESSMENT:   Consult Assessment of nutrition requirement/status  ASSESSMENT: 42 y.o. F, Originally presented to [facility] from home after husband found her confuse. Patient was transferred to [facility] admitted with acute encephalopathy. PMH; TBI, SAH, seizure disorder, Alcohol abuse, Hypokalemia. Declined appetite with reported weight loss  over the last 6 months. Review of weight history does not depict reported weight loss.  Patient setting up in bed. Stated she felt as though her brain felt a little clearer today. She was slow to answer question and was unable to do the OK sign with hand. She stated that her usual body weight was 105#, she was poor historian with nutritional history questions.  Reports seafood allergy. Discussed Ensure and she would like to try them. Husband at bed side.   Admit weight: 46.6 kg  Weight history: 04/12/23 46.6 kg  02/04/23 45.7 kg  10/17/22 43.4 kg  06/05/22 46.1 kg   Average Meal Intake: 95% x 1 meal  Nutritionally Relevant Medications: Scheduled Meds:  folic acid  1 mg Oral Daily   lactulose  300 mL Rectal Once   multivitamin with minerals  1 tablet Oral Daily   risperiDONE  1 mg Oral BID    Continuous Infusions:  thiamine (VITAMIN B1) injection 500 mg (04/15/23 1008)   PRN Meds:.  Labs Reviewed    NUTRITION - FOCUSED PHYSICAL EXAM:  Flowsheet  Row Most Recent Value  Orbital Region Mild depletion  Upper Arm Region Mild depletion  Thoracic and Lumbar Region Mild depletion  Buccal Region Mild depletion  Temple Region Mild depletion  Clavicle Bone Region Moderate depletion  Clavicle and Acromion Bone Region Moderate depletion  Scapular Bone Region No depletion  Dorsal Hand Unable to assess  Patellar Region Moderate depletion  Anterior Thigh Region Moderate depletion  Posterior Calf Region Moderate depletion  Edema (RD Assessment) None  Hair Reviewed  [scalp dry and flakey]  Eyes Reviewed  Mouth Reviewed  [Front teeth discolored]  Skin Reviewed  Nails Reviewed       Diet Order:   Diet Order             Diet regular Room service appropriate? No; Fluid consistency: Thin  Diet effective now                   EDUCATION NEEDS:   Education needs have been addressed  Skin:  Skin Assessment: Reviewed RN Assessment  Last BM:  PTA  Height:   Ht Readings from Last 1 Encounters:  04/12/23 5\' 2"  (1.575 m)    Weight:   Wt Readings from Last 1 Encounters:  04/12/23 46.6 kg    Ideal Body Weight:     BMI:  There is no height or weight on file to calculate BMI.  Estimated Nutritional Needs:   Kcal:  1500-1750 Kcal  Protein:  65-75 g/d  Fluid:  81ml/kcal    Jamelle Haring RDN, LDN Clinical Dietitian  If unable to reach, please contact "RD Inpatient" secure chat group between 8 am-4 pm daily"

## 2023-04-16 DIAGNOSIS — G9340 Encephalopathy, unspecified: Secondary | ICD-10-CM | POA: Diagnosis not present

## 2023-04-16 LAB — RENAL FUNCTION PANEL
Albumin: 2.4 g/dL — ABNORMAL LOW (ref 3.5–5.0)
Anion gap: 7 (ref 5–15)
BUN: <5 mg/dL — ABNORMAL LOW (ref 6–20)
CO2: 21 mmol/L — ABNORMAL LOW (ref 22–32)
Calcium: 8.5 mg/dL — ABNORMAL LOW (ref 8.9–10.3)
Chloride: 108 mmol/L (ref 98–111)
Creatinine, Ser: 0.48 mg/dL (ref 0.44–1.00)
GFR, Estimated: >60 mL/min (ref >60)
Glucose, Bld: 93 mg/dL (ref 70–99)
Phosphorus: 4.3 mg/dL (ref 2.5–4.6)
Potassium: 3.5 mmol/L (ref 3.5–5.1)
Sodium: 136 mmol/L (ref 135–145)

## 2023-04-16 LAB — CBC
HCT: 31.6 % — ABNORMAL LOW (ref 36.0–46.0)
Hemoglobin: 10.7 g/dL — ABNORMAL LOW (ref 12.0–15.0)
MCH: 35.1 pg — ABNORMAL HIGH (ref 26.0–34.0)
MCHC: 33.9 g/dL (ref 30.0–36.0)
MCV: 103.6 fL — ABNORMAL HIGH (ref 80.0–100.0)
Platelets: 243 10*3/uL (ref 150–400)
RBC: 3.05 MIL/uL — ABNORMAL LOW (ref 3.87–5.11)
RDW: 12.9 % (ref 11.5–15.5)
WBC: 7.1 10*3/uL (ref 4.0–10.5)
nRBC: 0 % (ref 0.0–0.2)

## 2023-04-16 LAB — ANA: Anti Nuclear Antibody (ANA): NEGATIVE

## 2023-04-16 MED ORDER — THIAMINE MONONITRATE 100 MG PO TABS
100.0000 mg | ORAL_TABLET | Freq: Every day | ORAL | Status: DC
  Administered 2023-04-17 – 2023-04-18 (×2): 100 mg via ORAL
  Filled 2023-04-16 (×2): qty 1

## 2023-04-16 MED ORDER — THIAMINE HCL 100 MG/ML IJ SOLN
500.0000 mg | Freq: Once | INTRAVENOUS | Status: AC
  Administered 2023-04-16: 500 mg via INTRAVENOUS
  Filled 2023-04-16: qty 5

## 2023-04-16 NOTE — Progress Notes (Signed)
 Mobility Specialist Progress Note:    04/16/23 1502  Mobility  Activity Ambulated independently in hallway  Level of Assistance Independent after set-up  Assistive Device None  Distance Ambulated (ft) 350 ft  Activity Response Tolerated well  Mobility Referral Yes  Mobility visit 1 Mobility  Mobility Specialist Start Time (ACUTE ONLY) 1455  Mobility Specialist Stop Time (ACUTE ONLY) 1502  Mobility Specialist Time Calculation (min) (ACUTE ONLY) 7 min   Pt received in bed, family at bedside. Agreeable to mobility session, ambulated in hallway, no AD required. Tolerated well, asx throughout. Cognition improved since first MS visit, responds appropriately to questions, sometimes delayed. Returned pt to room, left with all needs met.    Yannet Rincon Mobility Specialist Please contact via Special Educational Needs Teacher or  Rehab office at [phone]

## 2023-04-16 NOTE — Progress Notes (Addendum)
 HD#3 SUBJECTIVE:  Patient Summary: Alyssa Little is a 42 y.o. with a pertinent PMH of alcohol use do, traumatic brain injury c/b subarachnoid hemorrhage, seizures switched to Keppra  from Dilantin , and transaminitis who presented to the ED at [facility] with AMS, unable to follow commands and using inappropriate words. MRI brain with no acute intracranial process (low quality exam), EEG showing diffuse encephalopathy LTM EEG canceled, and a phenytoin  level of <2.5 admitted for acute encephalopathy.   Overnight Events: NAEO  Interim History:  Remains confused but improving. Having breakfast. Interested in quitting alcohol and having a therapy dog.   OBJECTIVE:  Vital Signs: Vitals:   04/15/23 1700 04/15/23 2031 04/16/23 0300 04/16/23 0943  BP: (!) 129/91 (!) 146/97 100/75 (!) 132/104  Pulse: (!) 115 (!) 109 91 99  Resp: 18   17  Temp: 98.2 F (36.8 C) 98.5 F (36.9 C) (!) 97.4 F (36.3 C) 98.1 F (36.7 C)  TempSrc: Oral Oral Axillary Oral  SpO2: 99% 99% 95% 99%   Supplemental O2: Room Air SpO2: 99 %   Intake/Output Summary (Last 24 hours) at 04/16/2023 1248 Last data filed at 04/16/2023 0406 Gross per 24 hour  Intake 110 ml  Output --  Net 110 ml   Net IO Since Admission: 890.23 mL [04/16/23 1248]  Physical Exam: Physical Exam Constitutional:      General: She is not in acute distress.    Appearance: She is not ill-appearing.  Eyes:     Conjunctiva/sclera: Conjunctivae normal.     Pupils: Pupils are equal, round, and reactive to light.  Cardiovascular:     Rate and Rhythm: Normal rate and regular rhythm.     Heart sounds: No murmur heard.    No friction rub. No gallop.  Pulmonary:     Effort: Pulmonary effort is normal. No respiratory distress.     Breath sounds: No wheezing, rhonchi or rales.  Abdominal:     General: Abdomen is flat. There is no distension.     Palpations: Abdomen is soft.     Tenderness: There is no abdominal tenderness. There is no  guarding or rebound.  Skin:    General: Skin is warm and dry.  Neurological:     Mental Status: She is alert.     Cranial Nerves: No cranial nerve deficit.     Comments: Oriented to self (name and DOB) and that she is in the hospital. No Nystagmus  Psychiatric:        Attention and Perception: She is inattentive.        Speech: Speech is tangential.        Behavior: Behavior is cooperative.    Patient Lines/Drains/Airways Status     Active Line/Drains/Airways     Name Placement date Placement time Site Days   Peripheral IV 04/12/23 20 G Anterior;Proximal;Right Forearm 04/12/23  1439  Forearm  1   Wound / Incision (Open or Dehisced) 02/24/18 Laceration Head Lateral;Right;Upper 02/24/18  2305  Head  1874            ASSESSMENT/PLAN:  Assessment: Principal Problem:   Acute encephalopathy Active Problems:   Seizures (HCC)   Alcohol use disorder   TBI (traumatic brain injury) (HCC)   Protein-calorie malnutrition (HCC)   Macrocytosis without anemia   Malnutrition of moderate degree  Plan:  #Hx of Seizures, post ictal state #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 Slept better last night, eating. Responds  to questions more clearly. Significant other concerned about dilantin  taper and her having breakthrough seizures. She slept better ON and was not responding to internal stimuli. Still somewhat confused. No nystagmus on exam anymore. Will continue to monitor her for at least one more day. ANA, RPR, B12, TSH WNL.  -Cw Keppra  1,500mg  po BID -Cw Risperidone  1mg  po BID - cw Dilantin  at 200 mg po every day.  -Thiamine  500mg  today and then transition to 100mg  tomorrow -cw folic acid  supplementation - Seizure precautions  -Delirium precautions -frequent neuro checks - Ativan  PRN for sustained seizures   #Alcohol use disorder  #Hepatic steatosis Interested in quitting and will benefit from starting Naltrexone . She will read  about it. Nystagmus resolved.   -cw thiamine   -Consider starting Naltrexone  -Will need OP FU   Protein-calorie malnutrition Weight loss -Continue current Reg diet -Continue MVM -Ensure Plus High Protein po BID, each supplement provides 350 kcal and 20 grams of protein. - Outpatient cancer screening   #Hx of Anxiety  - Can cw Hydroxyzine  10 mg    #Macrocytosis without anemia B12 381, normal. Folate WNL. Could be that macrocytosis is caused by alcohol. Anemia likely dilutional.    #Petechial rash  Over BLE. Could be due to fleas but per significant other, he has not seen these before and could be new. Non blanching. Hepatitis panel negative. No tampon on exam. No fevers, WBC WNL. Could be a vasculitis vs anthropod bites.  - Monitor PLT, fever curve, and eruption progression  Best Practice: Diet: Regular diet VTE: enoxaparin  (LOVENOX ) injection 40 mg Start: 04/12/23 2245 Code: Full Therapy Recs: Pending, DME: none Family Contact: significant other, pete, called and notified. DISPO: pending medical work-up   Signature: Washington Alexander-Savino,MD  Internal Medicine Resident, PGY-1 [facility] Res[facility]  Pager: [phone] 12:48 PM, 04/16/2023   Please contact the on call pager after 5 pm and on weekends at [phone].

## 2023-04-16 NOTE — Consult Note (Addendum)
 [facility] Psychiatric Consult Follow-up  Patient Name: .Alyssa Little  MRN: [id number]  DOB: 01/06/1981  Consult Order details:  Orders (From admission, onward)     Start     Ordered   04/13/23 1024  IP CONSULT TO PSYCHIATRY       Comments: Patient came in with acute AMS. Work-up negative for stroke, hx of seizures does not seem post-ictal, and alcohol use do, has nystagmus. Tangential not following commands. Perseverating on her different names  Ordering Provider: Volney Leash, MD  Provider:  (Not yet assigned)  Question Answer Comment  Location [facility]   Reason for Consult? AMS      04/13/23 1029            Mode of Visit: In person   Psychiatry Consult Evaluation  Service Date: April 16, 2023 LOS:  LOS: 3 days  Chief Complaint I don't understand what you're doing here.  Primary Psychiatric Diagnoses  Post-ictal 2/2 seizure disorder 2.  Alcohol use disorder   Assessment  Alyssa Little is a 42 y.o. female admitted: Presented to the EDfor 04/12/2023  2:16 PM for AMS. She has no prior psychiatric diagnoses and has a past medical history of Alcohol abuse, SAH (subarachnoid hemorrhage), Seizure disorder, TBI (traumatic brain injury).   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 Patient is confused and unable to follow command which is consistent with post ictal state in a patient with seizure disorder. Patient is not currently taking any outpatient psychotropic medications and has never seen a psychiatrist in her entire life. On initial examination, patient appears confused,disorganized and is not oriented to time, place, person or situation.   On follow-up examinations, patient appears to have had gradual improvement. With regards to mental status 12/30, patient missed  2 points on orientation, 3 points on recall and 1 point in language. Repeat MMSE 12/31 with missing 1 point for orientation, 3 points on recall, 1 point in language and 1 point on attention. She does not appear to be responding to internal/external stimuli but still appears confused, anxious with aphasia. Her speech waxes and wanes with regards to her response time. Will continue current medication regimen.   Diagnoses:  Active Hospital problems: Principal Problem:   Acute encephalopathy Active Problems:   Seizures (HCC)   Alcohol use disorder   TBI (traumatic brain injury) (HCC)   Protein-calorie malnutrition (HCC)   Macrocytosis without anemia   Malnutrition of moderate degree    Plan   ## Psychiatric Medication Recommendations:  -Continue Risperidone  to 1 mg twice daily -Continue Keppra  1500 mg twice daily for Seizure disorder  -Continue Thiamine , Folic acid  and multivitamins.  -Continue other medical treatment  -Psychiatric consult service will continue to follow up.   ## Medical Decision Making Capacity: Not specifically addressed in this encounter  ## Further Work-up:  Check B12, Folate, HIV, Syphilis, ANA.  -- most recent EKG on 04/12/23 had QtC of 461 -- Pertinent labwork reviewed earlier this admission includes:sodium-128, AST-91, ALT-62.  Note: Brain MRI is unremarkable.   ## Disposition:-- Patient will benefit from outpatient psychiatric follow up upon discharge.   ## Behavioral / Environmental: -Delirium Precautions: Delirium Interventions for Nursing and Staff: - RN to open blinds every AM. - To Bedside: Glasses, hearing aide, and pt's own shoes. Make available to patients. when possible and encourage use. - Encourage po fluids when  appropriate, keep fluids within reach. - OOB to chair with meals. - Passive ROM exercises to all extremities with AM & PM care. - RN to assess orientation to person, time and place QAM and PRN. - Recommend extended visitation hours with  familiar family/friends as feasible. - Staff to minimize disturbances at night. Turn off television when pt asleep or when not in use.    ## Safety and Observation Level:  - Based on my clinical evaluation, I estimate the patient to be at mild risk of self harm in the current setting. - At this time, we recommend  routine. This decision is based on my review of the chart including patient's history and current presentation, interview of the patient, mental status examination, and consideration of suicide risk including evaluating suicidal ideation, plan, intent, suicidal or self-harm behaviors, risk factors, and protective factors. This judgment is based on our ability to directly address suicide risk, implement suicide prevention strategies, and develop a safety plan while the patient is in the clinical setting. Please contact our team if there is a concern that risk level has changed.  CSSR Risk Category:C-SSRS RISK CATEGORY: No Risk  Suicide Risk Assessment: Patient has following modifiable risk factors for suicide: medication noncompliance, which we are addressing by prescribing medication. Patient has following non-modifiable or demographic risk factors for suicide:  Patient has the following protective factors against suicide: Supportive family  Thank you for this consult request. Recommendations have been communicated to the primary team.  We will follow up at this time.   Alyssa Minor, MD, PGY-2       History of Present Illness  Relevant Aspects of Hospital ED Course:  Admitted on 04/12/2023 for AMS. Patient have past medical history of Alcohol abuse,SAH (subarachnoid hemorrhage), Seizure disorder, TBI (traumatic brain injury), and Transaminitis, who presented from home to the [facility] on Friday afternoon for evaluation after her husband found her to be extremely confused at 1300. On arrival to the ED she was struggling to follow commands and had inappropriatefd words. She was felt most  likely to be postictal from an unwitnessed seizure at home. CT head revealed no acute abnormalities. Encephalomaln ascia was seen in the high anterior frontal lobes bilaterally, unchanged from the prior CT head dated September 19, 2022. SABRAShe has a longstanding history of seizures. She is currently being switched over to Keppra  from Dilantin , the latter being tapered.  Neurology consulted, suspect extended post-ictal aphasia.  Patient compliant with risperdal , continues on keppra  and dilantin   Chart review: la VSS. BP 100/75. Compliant with medications. No PRNs. CBC with microcytic anemia. Per PT, patient has met her baseline level of functioning from mobility standpoint but continues to have confusion and aphasia.   Patient Report:  Nursing reports that overnight patient had delayed responses and staring off. Patient is seen at bedside. She continues to have delayed responses to questions, her response time waxes and wanes. She also reports responses to prior questions during interview as she attempts to come up with words during the interview. She reports sleep not great. Partner reports her sleep was improved compared to prior night. Got around 4-6 hours of sleep. She reports good appetite. She reports her last BM was this AM. She denies any side effects to the medication. She reports her mood as confused. She reports that there are so many different things going on such as people yelling, people coming in. She reports she feels like she is living in a dormitory. She reports prior to coming  to the hospital she felt anxious but now she feels a little better. Asked partner at bedside about patient's baseline. He reports that at baseline patient was not able to hold job due to her seizure history. He reports he was helping with taking care of her medications at home and she would only cook complex dishes when he was around, otherwise her cooking consisted of microwaving things. She was able to take care of her  ADLs. When asked about her current mental status compared to baseline, patient reports she thinks she is at about 60% and partner states 50-60%. She denies SI/HI/AVH. Partner also reports concern regarding patient's mental status worsening when she is tapered off dilantin .  With regards to mental status 12/31, missing 1 point for orientation, 3 points on recall, 1 point in language and 1 point on attention.  Psych ROS:  Depression: did not report  Anxiety: did not report  Mania (lifetime and current): did not report  Psychosis: (lifetime and current): denies paranoia  Collateral information:  Partner at bedside. Notes patient has been making slow improvement. Have not observed patient responding to internal or external stimuli though notes disorientation at times and difficulty with recall. Reports she has been drinking at least 3 beers per day for the last 10 years and recently has also not been eating.  Collateral information from the patient's husband/partner at bedside Marshal Maier [phone]) indicates that patient suddenly became confused and was struggling to follow command at around noon on 04/12/23. He reports that patient was spaced out, not verbalizing, and did not understand his conversation. He denies any prior psychiatric diagnoses or ongoing psychotropic medication. He denies any prior psychotic symptoms including hallucination, paranoia or disorganized behavior. Partner equally denies depressive or anxiety symptoms. Partner/husband reports that patient has a Seizure disorder and drinks 3 beers of alcohol daily. She currently takes Keppra  prescribed by her Neurologist and has been compliant. She used to take Dilantin  but the Neurologist decided to switch her to Keppra  recently.   ROS   Psychiatric and Social History  Psychiatric History:  Information collected from patient and husband.   Prev Dx/Sx: none  Current Psych Provider: none  Home Meds (current): none  Previous Med  Trials: none  Therapy: Partner reports patient used to see a therapist but is not sure if she received any psychiatric diagnoses.  Prior Psych Hospitalization: denies   Prior Self Harm: denies  Prior Violence: denies   Family Psych History: denies  Family Hx suicide: denies   Social History:  Developmental Hx: unsure  Educational Hx: unsure  Occupational Hx: unsure  Legal Hx: unsure Living Situation: Currently lives with partner Spiritual Hx: unsure Access to weapons/lethal means: denies    Substance History Alcohol: Yes. Drinks 3 beer bottle daily.   Type of alcohol Beer Last Drink Yesterday Number of drinks per day 3 bottles  History of alcohol withdrawal seizures. Yes History of DT's Unsure  Tobacco: denies  Illicit drugs: denies  Prescription drug abuse: denies  Rehab hx: denies   Exam Findings  Physical Exam: Vital Signs:  Temp:  [97.4 F (36.3 C)-98.5 F (36.9 C)] 97.4 F (36.3 C) (12/31 0300) Pulse Rate:  [86-119] 91 (12/31 0300) Resp:  [18] 18 (12/30 1700) BP: (100-149)/(75-99) 100/75 (12/31 0300) SpO2:  [95 %-100 %] 95 % (12/31 0300) Blood pressure 100/75, pulse 91, temperature (!) 97.4 F (36.3 C), temperature source Axillary, resp. rate 18, SpO2 95%. There is no height or weight on file to calculate BMI.  Physical Exam Constitutional:      Appearance: the patient is not toxic-appearing.  Pulmonary:     Effort: Pulmonary effort is normal.  Neurological:     General: No focal deficit present.     Mental Status: the patient is alert and oriented to person and time.   Review of Systems  Respiratory:  Negative for shortness of breath.   Cardiovascular:  Negative for chest pain.  Gastrointestinal:  Negative for abdominal pain, constipation, diarrhea, nausea and vomiting.  Neurological:  Negative for headaches.   Mental Status Exam: General Appearance: Fairly Groomed  Orientation:  Full (Time, Place, and Person)  Memory:  Recent;   Fair   Concentration:  Attention Span: Poor  Recall:  Fair  Attention  Poor  Eye Contact:  Good  Speech:  Slow, Aphasic   Language:  Poor  Volume:  Normal  Mood: Anxious  Affect:  Constricted  Thought Process:  Coherent  Thought Content:  Logical  Suicidal Thoughts:  No  Homicidal Thoughts:  No  Judgement:  Fair  Insight:  Present  Psychomotor Activity:  Normal  Akathisia:  Negative  Fund of Knowledge:  Fair      Assets:  Social Support  Cognition:  Impaired,  Moderate  ADL's:  Impaired  AIMS (if indicated):        Other History   These have been pulled in through the EMR, reviewed, and updated if appropriate.  Family History:  The patient's Family history is unknown by patient.  Medical History: Past Medical History:  Diagnosis Date  . Alcohol abuse   . Hypokalemia 10/19/2022  . SAH (subarachnoid hemorrhage) (HCC)   . Seizure disorder (HCC)   . TBI (traumatic brain injury) (HCC)   . Transaminitis     Surgical History: Past Surgical History:  Procedure Laterality Date  . CHOLECYSTECTOMY       Medications:   Current Facility-Administered Medications:  .  acetaminophen  (TYLENOL ) tablet 650 mg, 650 mg, Oral, Q6H PRN **OR** acetaminophen  (TYLENOL ) suppository 650 mg, 650 mg, Rectal, Q6H PRN, Gomez-Caraballo, Maria, MD .  enoxaparin  (LOVENOX ) injection 40 mg, 40 mg, Subcutaneous, Q24H, Gomez-Caraballo, Maria, MD, 40 mg at 04/15/23 2213 .  feeding supplement (ENSURE ENLIVE / ENSURE PLUS) liquid 237 mL, 237 mL, Oral, BID BM, Guilloud, Carolyn, MD, 237 mL at 04/15/23 1249 .  folic acid  (FOLVITE ) tablet 1 mg, 1 mg, Oral, Daily, Gomez-Caraballo, Maria, MD, 1 mg at 04/15/23 1002 .  lactulose  (CHRONULAC ) enema 200 gm, 300 mL, Rectal, Once, Gomez-Caraballo, Maria, MD .  levETIRAcetam  (KEPPRA ) tablet 1,500 mg, 1,500 mg, Oral, BID, Alexander-Savino, Washington, MD, 1,500 mg at 04/15/23 2213 .  multivitamin with minerals tablet 1 tablet, 1 tablet, Oral, Daily, Gomez-Caraballo,  Maria, MD, 1 tablet at 04/15/23 1002 .  phenytoin  (DILANTIN ) chewable tablet 200 mg, 200 mg, Oral, Daily, Lindzen, Eric, MD, 200 mg at 04/15/23 1003 .  risperiDONE  (RISPERDAL ) tablet 1 mg, 1 mg, Oral, BID, Alexander-Savino, Washington, MD, 1 mg at 04/15/23 2212 .  [COMPLETED] thiamine  (VITAMIN B1) 500 mg in sodium chloride  0.9 % 50 mL IVPB, 500 mg, Intravenous, TID, Last Rate: 110 mL/hr at 04/14/23 1610, 500 mg at 04/14/23 1610 **FOLLOWED BY** thiamine  (VITAMIN B1) 500 mg in sodium chloride  0.9 % 50 mL IVPB, 500 mg, Intravenous, BID, Alexander-Savino, Washington, MD, Stopped at 04/15/23 2315  Allergies: Allergies  Allergen Reactions  . Shellfish Allergy Anaphylaxis  . Flagyl  [Metronidazole ] Hives  . Penicillins Hives, Rash and Other (See Comments)    Tolerated cephalosporins  including ancef   Has patient had a PCN reaction causing immediate rash, facial/tongue/throat swelling, SOB or lightheadedness with hypotension: Unknown Has patient had a PCN reaction causing severe rash involving mucus membranes or skin necrosis: Unknown Has patient had a PCN reaction that required hospitalization: Unknown Has patient had a PCN reaction occurring within the last 10 years: Unknown If all of the above answers are NO, then may    Alyssa Minor, MD, PGY-2

## 2023-04-16 NOTE — Plan of Care (Signed)
  Problem: Education: Goal: Knowledge of General Education information will improve Description: Including pain rating scale, medication(s)/side effects and non-pharmacologic comfort measures Outcome: Progressing   Problem: Health Behavior/Discharge Planning: Goal: Ability to manage health-related needs will improve Outcome: Progressing   Problem: Clinical Measurements: Goal: Ability to maintain clinical measurements within normal limits will improve Outcome: Progressing Goal: Will remain free from infection Outcome: Progressing Goal: Diagnostic test results will improve Outcome: Progressing Goal: Respiratory complications will improve Outcome: Progressing Goal: Cardiovascular complication will be avoided Outcome: Progressing   Problem: Activity: Goal: Risk for activity intolerance will decrease Outcome: Progressing   Problem: Nutrition: Goal: Adequate nutrition will be maintained Outcome: Progressing   Problem: Coping: Goal: Level of anxiety will decrease Outcome: Progressing   Problem: Elimination: Goal: Will not experience complications related to bowel motility Outcome: Progressing Goal: Will not experience complications related to urinary retention Outcome: Progressing   Problem: Pain Management: Goal: General experience of comfort will improve Outcome: Progressing   Problem: Safety: Goal: Ability to remain free from injury will improve Outcome: Progressing   Problem: Skin Integrity: Goal: Risk for impaired skin integrity will decrease Outcome: Progressing   Problem: Education: Goal: Expressions of having a comfortable level of knowledge regarding the disease process will increase Outcome: Progressing   Problem: Coping: Goal: Ability to adjust to condition or change in health will improve Outcome: Progressing Goal: Ability to identify appropriate support needs will improve Outcome: Progressing   Problem: Health Behavior/Discharge Planning: Goal:  Compliance with prescribed medication regimen will improve Outcome: Progressing   Problem: Medication: Goal: Risk for medication side effects will decrease Outcome: Progressing   Problem: Clinical Measurements: Goal: Complications related to the disease process, condition or treatment will be avoided or minimized Outcome: Progressing Goal: Diagnostic test results will improve Outcome: Progressing   Problem: Safety: Goal: Verbalization of understanding the information provided will improve Outcome: Progressing   Problem: Self-Concept: Goal: Level of anxiety will decrease Outcome: Progressing Goal: Ability to verbalize feelings about condition will improve Outcome: Progressing

## 2023-04-17 ENCOUNTER — Inpatient Hospital Stay (HOSPITAL_COMMUNITY): Payer: Medicaid Other

## 2023-04-17 DIAGNOSIS — G9340 Encephalopathy, unspecified: Secondary | ICD-10-CM | POA: Diagnosis not present

## 2023-04-17 LAB — RENAL FUNCTION PANEL
Albumin: 2.3 g/dL — ABNORMAL LOW (ref 3.5–5.0)
Anion gap: 8 (ref 5–15)
BUN: 5 mg/dL — ABNORMAL LOW (ref 6–20)
CO2: 22 mmol/L (ref 22–32)
Calcium: 8.6 mg/dL — ABNORMAL LOW (ref 8.9–10.3)
Chloride: 106 mmol/L (ref 98–111)
Creatinine, Ser: 0.36 mg/dL — ABNORMAL LOW (ref 0.44–1.00)
GFR, Estimated: >60 mL/min (ref >60)
Glucose, Bld: 100 mg/dL — ABNORMAL HIGH (ref 70–99)
Phosphorus: 4.1 mg/dL (ref 2.5–4.6)
Potassium: 3.5 mmol/L (ref 3.5–5.1)
Sodium: 136 mmol/L (ref 135–145)

## 2023-04-17 LAB — CBC
HCT: 31.5 % — ABNORMAL LOW (ref 36.0–46.0)
Hemoglobin: 10.4 g/dL — ABNORMAL LOW (ref 12.0–15.0)
MCH: 34.2 pg — ABNORMAL HIGH (ref 26.0–34.0)
MCHC: 33 g/dL (ref 30.0–36.0)
MCV: 103.6 fL — ABNORMAL HIGH (ref 80.0–100.0)
Platelets: 229 10*3/uL (ref 150–400)
RBC: 3.04 MIL/uL — ABNORMAL LOW (ref 3.87–5.11)
RDW: 13 % (ref 11.5–15.5)
WBC: 7.6 10*3/uL (ref 4.0–10.5)
nRBC: 0 % (ref 0.0–0.2)

## 2023-04-17 LAB — MAGNESIUM: Magnesium: 1.5 mg/dL — ABNORMAL LOW (ref 1.7–2.4)

## 2023-04-17 LAB — CK: Total CK: 43 U/L (ref 38–234)

## 2023-04-17 LAB — PHENYTOIN LEVEL, TOTAL: Phenytoin Lvl: <2.5 ug/mL — ABNORMAL LOW (ref 10.0–20.0)

## 2023-04-17 MED ORDER — PHENYTOIN 50 MG PO CHEW
100.0000 mg | CHEWABLE_TABLET | Freq: Once | ORAL | Status: AC
  Administered 2023-04-17: 100 mg via ORAL
  Filled 2023-04-17: qty 2

## 2023-04-17 MED ORDER — LORAZEPAM 2 MG/ML IJ SOLN: 2.0000 mg | INTRAMUSCULAR | Status: DC | PRN

## 2023-04-17 MED ORDER — LACTATED RINGERS IV BOLUS
1000.0000 mL | Freq: Once | INTRAVENOUS | Status: AC
  Administered 2023-04-17: 1000 mL via INTRAVENOUS

## 2023-04-17 MED ORDER — NALTREXONE HCL 50 MG PO TABS
50.0000 mg | ORAL_TABLET | Freq: Every day | ORAL | Status: DC
Start: 2023-04-17 — End: 2023-04-18
  Administered 2023-04-17 – 2023-04-18 (×2): 50 mg via ORAL
  Filled 2023-04-17 (×2): qty 1

## 2023-04-17 MED ORDER — PHENYTOIN 50 MG PO CHEW
300.0000 mg | CHEWABLE_TABLET | Freq: Every day | ORAL | Status: DC
  Filled 2023-04-17: qty 6

## 2023-04-17 NOTE — Progress Notes (Addendum)
 Received a page from nursing staff at 0205, per nursing staff they were speaking with patient around 0200, when the patient suddenly stopped speaking, and was staring off in the distance.  They state the patient seized for about 6 minutes, before spontaneous resolution. Per nursing, appears that she may have bit her tongue.  Resident physicians presented to bedside for evaluation.  Patient able to follow some directions at time of exam. Orient to self and place, but not time or situation. On cranial nerve exam, patient demonstrated horizontal nystagmus. EOM intact. Some blood and saliva seen near head of bed. Patient is encephalopathic - speech is non-coherent.  At time of exam, patient was tachycardic into 130's, tachypneic, hypertensive to the 140s systolic, with normal oxygen saturation on room air.  We have reached out to Neruology in regards to this event, who recommended we check a Phenytoin  level. Neurology to assess. We will also administer 1L of fluids to see if this improves her tachycardia. We will obtain a CK. Written for PRN ativan  in case of future seizure / status epilepticus. We will obtain EKG and stat chest xray.

## 2023-04-17 NOTE — Plan of Care (Signed)
  Problem: Education: Goal: Knowledge of General Education information will improve Description: Including pain rating scale, medication(s)/side effects and non-pharmacologic comfort measures Outcome: Progressing   Problem: Health Behavior/Discharge Planning: Goal: Ability to manage health-related needs will improve Outcome: Progressing   Problem: Clinical Measurements: Goal: Ability to maintain clinical measurements within normal limits will improve Outcome: Progressing Goal: Will remain free from infection Outcome: Progressing Goal: Diagnostic test results will improve Outcome: Progressing Goal: Respiratory complications will improve Outcome: Progressing Goal: Cardiovascular complication will be avoided Outcome: Progressing   Problem: Activity: Goal: Risk for activity intolerance will decrease Outcome: Progressing   Problem: Nutrition: Goal: Adequate nutrition will be maintained Outcome: Progressing   Problem: Coping: Goal: Level of anxiety will decrease Outcome: Progressing   Problem: Elimination: Goal: Will not experience complications related to bowel motility Outcome: Progressing Goal: Will not experience complications related to urinary retention Outcome: Progressing   Problem: Pain Management: Goal: General experience of comfort will improve Outcome: Progressing   Problem: Safety: Goal: Ability to remain free from injury will improve Outcome: Progressing   Problem: Skin Integrity: Goal: Risk for impaired skin integrity will decrease Outcome: Progressing   Problem: Education: Goal: Expressions of having a comfortable level of knowledge regarding the disease process will increase Outcome: Progressing   Problem: Coping: Goal: Ability to adjust to condition or change in health will improve Outcome: Progressing Goal: Ability to identify appropriate support needs will improve Outcome: Progressing   Problem: Health Behavior/Discharge Planning: Goal:  Compliance with prescribed medication regimen will improve Outcome: Progressing   Problem: Medication: Goal: Risk for medication side effects will decrease Outcome: Progressing   Problem: Clinical Measurements: Goal: Complications related to the disease process, condition or treatment will be avoided or minimized Outcome: Progressing Goal: Diagnostic test results will improve Outcome: Progressing   Problem: Safety: Goal: Verbalization of understanding the information provided will improve Outcome: Progressing   Problem: Self-Concept: Goal: Level of anxiety will decrease Outcome: Progressing Goal: Ability to verbalize feelings about condition will improve Outcome: Progressing

## 2023-04-17 NOTE — Consult Note (Signed)
 [facility] Psychiatric Consult Follow-up  Patient Name: .Alyssa Little  MRN: [id number]  DOB: April 18, 1980  Consult Order details:  Orders (From admission, onward)     Start     Ordered   04/13/23 1024  IP CONSULT TO PSYCHIATRY       Comments: Patient came in with acute AMS. Work-up negative for stroke, hx of seizures does not seem post-ictal, and alcohol use do, has nystagmus. Tangential not following commands. Perseverating on her different names  Ordering Provider: Volney Leash, MD  Provider:  (Not yet assigned)  Question Answer Comment  Location [facility]   Reason for Consult? AMS      04/13/23 1029            Mode of Visit: In person   Psychiatry Consult Evaluation  Service Date: April 17, 2023 LOS:  LOS: 4 days  Chief Complaint I don't understand what you're doing here.  Primary Psychiatric Diagnoses  Post-ictal 2/2 seizure disorder 2.  Alcohol use disorder   Assessment  Alyssa Little is a 43 y.o. female admitted: Presented to the EDfor 04/12/2023  2:16 PM for AMS. She has no prior psychiatric diagnoses and has a past medical history of Alcohol abuse, SAH (subarachnoid hemorrhage), Seizure disorder, TBI (traumatic brain injury).   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 Patient is confused and unable to follow command which is consistent with post ictal state in a patient with seizure disorder. Patient is not currently taking any outpatient psychotropic medications and has never seen a psychiatrist in her entire life. On initial examination, patient appears confused,disorganized and is not oriented to time, place, person or situation.   On follow-up examinations, patient appears to have had gradual improvement. She no longer appeared too be internally preoccupied.  She still appeared confused and anxious with aphasia which was suspected to be due to post-ictal state in a patient with seizure disorder. Her speech waxes and wanes with regards to her response time. With regards to mental status 12/30, patient missed 2 points on orientation, 3 points on recall and 1 point in language. Repeat MMSE 12/31 with missing 1 point for orientation, 3 points on recall, 1 point in language and 1 point on attention. Patient had a seizure episode on 1/1 but actually appeared improved after the seizure. Her speech was increased in fluency. MMSE with only missing 2 points on attention, 1 point in language.  Will continue current medication regimen and recommended to patient and partner at bedside to follow-up with the [facility] for walk-in hours for continued medication management and outpatient taper.   Diagnoses:  Active Hospital problems: Principal Problem:   Acute encephalopathy Active Problems:   Seizures (HCC)   Alcohol use disorder   TBI (traumatic brain injury) (HCC)   Protein-calorie malnutrition (HCC)   Macrocytosis without anemia   Malnutrition of moderate degree    Plan   ## Psychiatric Medication Recommendations:  -Continue Risperidone  to 1 mg twice daily -Continue Keppra  1500 mg twice daily and dilantin  for Seizure disorder per neurology -Continue Thiamine , Folic acid  and multivitamins.  -Continue other medical treatment   ## Medical Decision Making Capacity: Not specifically addressed in this encounter  ## Further Work-up:  Check B12, Folate, HIV, Syphilis, ANA.  -- most recent EKG on 04/12/23 had QtC of 461 -- Pertinent labwork reviewed earlier this admission includes:sodium-128, AST-91, ALT-62.  Note: Brain MRI is unremarkable.   ## Disposition:-- Patient will benefit from outpatient psychiatric follow up upon discharge.   ## Behavioral / Environmental: -Delirium Precautions: Delirium Interventions for Nursing and Staff: - RN to open blinds every AM.  - To Bedside: Glasses, hearing aide, and pt's own shoes. Make available to patients. when possible and encourage use. - Encourage po fluids when appropriate, keep fluids within reach. - OOB to chair with meals. - Passive ROM exercises to all extremities with AM & PM care. - RN to assess orientation to person, time and place QAM and PRN. - Recommend extended visitation hours with familiar family/friends as feasible. - Staff to minimize disturbances at night. Turn off television when pt asleep or when not in use.    ## Safety and Observation Level:  - Based on my clinical evaluation, I estimate the patient to be at mild risk of self harm in the current setting. - At this time, we recommend  routine. This decision is based on my review of the chart including patient's history and current presentation, interview of the patient, mental status examination, and consideration of suicide risk including evaluating suicidal ideation, plan, intent, suicidal or self-harm behaviors, risk factors, and protective factors. This judgment is based on our ability to directly address suicide risk, implement suicide prevention strategies, and develop a safety plan while the patient is in the clinical setting. Please contact our team if there is a concern that risk level has changed.  CSSR Risk Category:C-SSRS RISK CATEGORY: No Risk  Suicide Risk Assessment: Patient has following modifiable risk factors for suicide: medication noncompliance, which we are addressing by prescribing medication. Patient has following non-modifiable or demographic risk factors for suicide:  Patient has the following protective factors against suicide: Supportive family  Thank you for this consult request. Recommendations have been communicated to the primary team.  We will sign off at this time.   Corean Minor, MD, PGY-2       History of Present Illness  Relevant Aspects of Hospital ED Course:  Admitted on 04/12/2023 for AMS. Patient  have past medical history of Alcohol abuse,SAH (subarachnoid hemorrhage), Seizure disorder, TBI (traumatic brain injury), and Transaminitis, who presented from home to the [facility] on Friday afternoon for evaluation after her husband found her to be extremely confused at 1300. On arrival to the ED she was struggling to follow commands and had inappropriatefd words. She was felt most likely to be postictal from an unwitnessed seizure at home. CT head revealed no acute abnormalities. Encephalomaln ascia was seen in the high anterior frontal lobes bilaterally, unchanged from the prior CT head dated September 19, 2022. SABRAShe has a longstanding history of seizures. She is currently being switched over to Keppra  from Dilantin , the latter being tapered. Neurology consulted, suspect extended post-ictal aphasia.  Patient compliant with risperdal , continued on keppra  and dilantin  with slow improvement. On 1/1 patient had episode of seizure activity ~6 min, she suddenly stopped speaking, may have bit her tongue and with unstable vitals (tachycardia, hypertension).   Chart review:  VSS. BP 98/71. Compliant with medications. No PRNs. CBC with microcytic anemia. Per PT, patient has met her baseline level of functioning from mobility standpoint but continues to have confusion and aphasia. Patient had episode of seizure activity ~6 min overnight, she suddenly stopped speaking, may have bit her tongue and with unstable vitals (tachycardia, hypertension).   Patient Report:  Nursing reports patient had seizure episode overnight. Patient is seen at bedside. Patient has increased  speech fluency. She reports that she kept on waking up yesterday because it was very loud. She reports no issues with appetite. She reports her mood is better than it has been and she feels more comfortable with communicating. She reports her depression is 0/10, reports feeling upbeat. She denies SI/HI. She reports she has some baseline anxiety around people and  reports her anxiety is 4-5/10. She reports no paranoia, just reports not completely comfortable because she doesn't know all the staff in the hospital. She reports not hearing any voices, states that she more so gets distracted during conversations by like shiny things. 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 Discussed that we had started the risperidone  in the setting of post-ictal psychosis but do not think she will likely need to be on this medication long-term. Discussed follow-up with [facility] for outpatient medication management and will place walk-in hours in discharge instructions. Discussed return precautions with patient. She denies any SI/HI/AVH. She reports she feels like she is at 85% of her baseline and partner reports she seems like she is back to 100% of her baseline. She reports they plan for her to get a dog when she goes back.   With regards to mental status 1/1, missing 2 points on attention, 1 point on language.   Psych ROS:  Depression: partner denies Anxiety: reported some anxiety prior to coming to the hospital Mania (lifetime and current): denied Psychosis: (lifetime and current): denied  Collateral information:  Partner at bedside. Notes patient has been making slow improvement. Have not observed patient responding to internal or external stimuli though notes disorientation at times and difficulty with recall. Reports she has been drinking at least 3 beers per day for the last 10 years and recently has also not been eating.  Collateral information from the patient's husband/partner at bedside Marshal Maier [phone]) indicates that patient suddenly became confused and was struggling to follow command at around noon on 04/12/23. He reports that patient was spaced out, not verbalizing, and did not understand his  conversation. He denies any prior psychiatric diagnoses or ongoing psychotropic medication. He denies any prior psychotic symptoms including hallucination, paranoia or disorganized behavior. Partner equally denies depressive or anxiety symptoms. Partner/husband reports that patient has a Seizure disorder and drinks 3 beers of alcohol daily. She currently takes Keppra  prescribed by her Neurologist and has been compliant. She used to take Dilantin  but the Neurologist decided to switch her to Keppra  recently.   ROS   Psychiatric and Social History  Psychiatric History:  Information collected from patient and husband.   Prev Dx/Sx: none  Current Psych Provider: none  Home Meds (current): none  Previous Med Trials: none  Therapy: Partner reports patient used to see a therapist but is not sure if she received any psychiatric diagnoses.  Prior Psych Hospitalization: denies   Prior Self Harm: denies  Prior Violence: denies   Family Psych History: denies  Family Hx suicide: denies   Social History:  Developmental Hx: unsure  Educational Hx: unsure  Occupational Hx: unsure  Legal Hx: unsure Living Situation: Currently lives with partner Spiritual Hx: unsure Access to weapons/lethal means: denies    Substance History Alcohol: Yes. Drinks 3 beer bottle daily.   Type of alcohol Beer Last Drink Yesterday Number of drinks per day 3 bottles  History  of alcohol withdrawal seizures. Yes History of DT's Unsure  Tobacco: denies  Illicit drugs: denies  Prescription drug abuse: denies  Rehab hx: denies   Exam Findings  Physical Exam: Vital Signs:  Temp:  [97.8 F (36.6 C)-98.3 F (36.8 C)] 97.8 F (36.6 C) (01/01 0610) Pulse Rate:  [91-136] 91 (01/01 0610) Resp:  [17-24] 18 (01/01 0610) BP: (98-144)/(69-104) 98/71 (01/01 0610) SpO2:  [96 %-99 %] 96 % (01/01 0610) Blood pressure 98/71, pulse 91, temperature 97.8 F (36.6 C), temperature source Oral, resp. rate 18, SpO2 96%. There is no  height or weight on file to calculate BMI.  Physical Exam Constitutional:      Appearance: the patient is not toxic-appearing.  Pulmonary:     Effort: Pulmonary effort is normal.  Neurological:     General: No focal deficit present.     Mental Status: the patient is alert and oriented to person and time.   Review of Systems  Respiratory:  Negative for shortness of breath.   Cardiovascular:  Negative for chest pain.  Gastrointestinal:  Negative for abdominal pain, constipation, diarrhea, nausea and vomiting.  Neurological:  Negative for headaches.   Mental Status Exam: General Appearance: Fairly Groomed  Orientation:  Full (Time, Place, and Person)  Memory:  Recent;   Fair  Concentration:  Attention Span: Poor  Recall:  Fair  Attention  Fair  Eye Contact:  Good  Speech:  Normal Rate  Language:  Fair  Volume:  Normal  Mood: Better than it has been  Affect:  Constricted  Thought Process:  Coherent  Thought Content:  Logical  Suicidal Thoughts:  No  Homicidal Thoughts:  No  Judgement:  Fair  Insight:  Present  Psychomotor Activity:  Normal  Akathisia:  Negative  Fund of Knowledge:  Fair      Assets:  Social Support  Cognition:  Impaired,  Moderate  ADL's:  Impaired  AIMS (if indicated):        Other History   These have been pulled in through the EMR, reviewed, and updated if appropriate.  Family History:  The patient's Family history is unknown by patient.  Medical History: Past Medical History:  Diagnosis Date  . Alcohol abuse   . Hypokalemia 10/19/2022  . SAH (subarachnoid hemorrhage) (HCC)   . Seizure disorder (HCC)   . TBI (traumatic brain injury) (HCC)   . Transaminitis     Surgical History: Past Surgical History:  Procedure Laterality Date  . CHOLECYSTECTOMY       Medications:   Current Facility-Administered Medications:  .  acetaminophen  (TYLENOL ) tablet 650 mg, 650 mg, Oral, Q6H PRN **OR** acetaminophen  (TYLENOL ) suppository 650 mg, 650  mg, Rectal, Q6H PRN, Gomez-Caraballo, Maria, MD .  enoxaparin  (LOVENOX ) injection 40 mg, 40 mg, Subcutaneous, Q24H, Gomez-Caraballo, Maria, MD, 40 mg at 04/16/23 2145 .  feeding supplement (ENSURE ENLIVE / ENSURE PLUS) liquid 237 mL, 237 mL, Oral, BID BM, Guilloud, Carolyn, MD, 237 mL at 04/16/23 1239 .  folic acid  (FOLVITE ) tablet 1 mg, 1 mg, Oral, Daily, Gomez-Caraballo, Maria, MD, 1 mg at 04/16/23 1239 .  levETIRAcetam  (KEPPRA ) tablet 1,500 mg, 1,500 mg, Oral, BID, Alexander-Savino, Washington, MD, 1,500 mg at 04/16/23 2145 .  LORazepam  (ATIVAN ) injection 2 mg, 2 mg, Intramuscular, PRN, Bhagat, Srishti L, MD .  multivitamin with minerals tablet 1 tablet, 1 tablet, Oral, Daily, Gomez-Caraballo, Maria, MD, 1 tablet at 04/16/23 1239 .  phenytoin  (DILANTIN ) chewable tablet 200 mg, 200 mg, Oral, Daily, Lindzen, Eric, MD, 200  mg at 04/16/23 1240 .  risperiDONE  (RISPERDAL ) tablet 1 mg, 1 mg, Oral, BID, Alexander-Savino, Washington, MD, 1 mg at 04/16/23 2145 .  thiamine  (VITAMIN B1) tablet 100 mg, 100 mg, Oral, Daily, McLendon, Michael, MD  Allergies: Allergies  Allergen Reactions  . Shellfish Allergy Anaphylaxis  . Flagyl  [Metronidazole ] Hives  . Penicillins Hives, Rash and Other (See Comments)    Tolerated cephalosporins including ancef   Has patient had a PCN reaction causing immediate rash, facial/tongue/throat swelling, SOB or lightheadedness with hypotension: Unknown Has patient had a PCN reaction causing severe rash involving mucus membranes or skin necrosis: Unknown Has patient had a PCN reaction that required hospitalization: Unknown Has patient had a PCN reaction occurring within the last 10 years: Unknown If all of the above answers are NO, then may    Dorance Spink, MD, PGY-2

## 2023-04-17 NOTE — Progress Notes (Signed)
 HD#4 SUBJECTIVE:  Patient Summary: Alyssa Little is a 43 y.o. with a pertinent PMH of alcohol use do, traumatic brain injury c/b subarachnoid hemorrhage, seizures switched to Keppra  from Dilantin , and transaminitis who presented to the ED at [facility] with AMS, unable to follow commands and using inappropriate words. MRI brain with no acute intracranial process (low quality exam), EEG cw post-ictal state admitted for psychosis 2/2 TBI and post-ictal state after seizure.   Overnight Events: Seizure 6 min, On Keppra  and Dilantin . Dilantin  levels were pending.  Interim History:  Feels that she is 80% back to baseline. Per patient, she will feel 100% at home given that she has not been around many people for a while, and being around people is a little overwhelming. Interested in quitting alcohol. Lost her pet cat recently and it has been tough on her. Considering in getting a service dog.   OBJECTIVE:  Vital Signs: Vitals:   04/17/23 0307 04/17/23 0508 04/17/23 0610 04/17/23 0737  BP: (!) 142/102 105/69 98/71 100/76  Pulse: 100 97 91 99  Resp: 20 18 18 15   Temp: 98.2 F (36.8 C) 98.2 F (36.8 C) 97.8 F (36.6 C) 98 F (36.7 C)  TempSrc: Oral Oral Oral Oral  SpO2: 99% 97% 96% 94%   Supplemental O2: Room Air SpO2: 94 %   Intake/Output Summary (Last 24 hours) at 04/17/2023 1238 Last data filed at 04/17/2023 0418 Gross per 24 hour  Intake 888.09 ml  Output --  Net 888.09 ml   Net IO Since Admission: 2,018.32 mL [04/17/23 1238]  Physical Exam: Physical Exam Constitutional:      General: She is not in acute distress.    Appearance: She is not ill-appearing.  Eyes:     Conjunctiva/sclera: Conjunctivae normal.     Pupils: Pupils are equal, round, and reactive to light.  Cardiovascular:     Rate and Rhythm: Normal rate and regular rhythm.     Heart sounds: No murmur heard.    No friction rub. No gallop.  Pulmonary:     Effort: Pulmonary effort is normal. No respiratory  distress.     Breath sounds: No wheezing, rhonchi or rales.  Abdominal:     General: Abdomen is flat. There is no distension.     Palpations: Abdomen is soft.     Tenderness: There is no abdominal tenderness. There is no guarding or rebound.  Skin:    General: Skin is warm and dry.  Neurological:     Mental Status: She is alert.     Cranial Nerves: No cranial nerve deficit.     Comments: AAO x 4  No Nystagmus  Psychiatric:        Attention and Perception: Attention normal.        Behavior: Behavior is cooperative.    Patient Lines/Drains/Airways Status     Active Line/Drains/Airways     Name Placement date Placement time Site Days   Peripheral IV 04/12/23 20 G Anterior;Proximal;Right Forearm 04/12/23  1439  Forearm  1   Wound / Incision (Open or Dehisced) 02/24/18 Laceration Head Lateral;Right;Upper 02/24/18  2305  Head  1874            ASSESSMENT/PLAN:  Assessment: Principal Problem:   Acute encephalopathy Active Problems:   Seizures (HCC)   Alcohol use disorder   TBI (traumatic brain injury) (HCC)   Protein-calorie malnutrition (HCC)   Macrocytosis without anemia   Malnutrition of moderate degree  Plan:  #Hx of Seizures, post ictal  state #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 No longer tangential. Had seizure overnight and will keep her here for observation one more night. Appreciate Neuro and Psych. She should be on Keppra  1,500mg  BID and dose of Dlantin adjusted to 300mg  at night. Will give her 100mg  of Dilantin  tonight since she got the 200mg  this AM. She should also stay on Risperidone  1mg  BID per Psych and follow up with [facility] (referral placed for walk-in hours by psych). Should continue on thiamine  and folic acid  supplementation. Seizure and delirium precautions.   -Cw Keppra  1,500mg  po BID -Increase Dilantin  at 300 mg po every day.  -Cw Risperidone  1mg  po BID -Cw 100mg  of PO Thiamine  (s/p 9 doses of IV  Thiamine ) -cw folic acid  supplementation -Seizure precautions  -Driving precautions -Delirium precautions -frequent neuro checks - Ativan  PRN for sustained seizures -OP FU with neuro and psych   #Alcohol use disorder  #Hepatic steatosis Interested in quitting and wants to start Naltrexone . Nystagmus not present on exam today.  -cw thiamine   -start Naltrexone  today  -Will need OP FU   Protein-calorie malnutrition Weight loss -Continue current Reg diet -Continue MVM -Ensure Plus High Protein po BID, each supplement provides 350 kcal and 20 grams of protein. - Outpatient cancer screening   #Hx of Anxiety  - Can cw Hydroxyzine  10 mg    #Macrocytosis without anemia B12 381, normal. Folate WNL. Could be that macrocytosis is caused by alcohol. Anemia likely dilutional.    #Petechial rash  Almost resolved today. States that this has been a chronic issue. Could have been flea bites from home.  Best Practice: Diet: Regular diet VTE: enoxaparin  (LOVENOX ) injection 40 mg Start: 04/12/23 2245 Code: Full Therapy Recs: Pending, DME: none Family Contact: significant other, pete, called and notified. DISPO: pending medical work-up   Signature: Washington Alexander-Savino,MD  Internal Medicine Resident, PGY-1 [facility]  Pager: [phone] 12:38 PM, 04/17/2023   Please contact the on call pager after 5 pm and on weekends at [phone].

## 2023-04-17 NOTE — Progress Notes (Signed)
 On reevaluation at bedside, patient is doing much better. She is able to participate in conversation and is even able to recognize her partner at bedside.

## 2023-04-17 NOTE — Progress Notes (Addendum)
 NEUROLOGY CONSULT FOLLOW UP NOTE   Date of service: April 17, 2023 Patient Name: Alyssa Little MRN:  [id number] DOB:  March 21, 1981  Brief Review of HPI  43 y.o. female who has a past medical history of Alcohol abuse, Hypokalemia (10/19/2022), SAH (subarachnoid hemorrhage), Seizure disorder, TBI (traumatic brain injury), and Transaminitis, who presented from home to the [facility] on Friday afternoon for evaluation after her boyfriend found her to be extremely confused at 1300. He actually noted a decline over several hours rather than abruptly, beginning with mild confusion and language difficulties and culminating in complete word salad with inability to communicate ideas or consistently follow instructions. On arrival to the ED she was struggling to follow commands and had inappropriate words. She was felt most likely to be postictal from an unwitnessed seizure at home. CT head revealed no acute abnormalities. Encephalomalacia was seen in the high anterior frontal lobes bilaterally, unchanged from the prior CT head dated September 19, 2022. SABRAShe has a longstanding history of seizures. She is currently being switched over to Keppra  from Dilantin , the latter being tapered. Dilantin  was previously at 300 mg every day, then changed to 200 mg recently. Patient and boyrfriend are uncertain if Dilantin  was at 200 or 100 mg every day at the time of the above event. Keppra  1000 mg IV was loaded in the ED, but her very atypical behaviors continued. She was transferred to [facility] for EEG. She has been gradually improving here, initially with severe receptive aphasia, now with mild to moderate receptive aphasia.   Boyfriend states that she started having seizures about 6 years ago. They are typically GTC seizures. She has had only one prior episode resembling her current presentation with receptive aphasia, which had lasted about 24-48 hours previously. Patient and boyfriend state that this happened a few years ago.    On review of neurology notes, on office visit 11/21/2022 she was planned to stop phenytoin  and start Depakote  500 mg twice daily while continuing Keppra .  Unfortunately she did not tolerate the Depakote  due to confusion/memory loss; however she continued with the phenytoin  and was still drinking 3 beers per day at time of last neurology visit 04/03/2023.  She was again counseled to stop phenytoin  and limit her alcohol use.  Per PCP note on 04/12/2023 at approximately 9 AM she was still drinking daily and taking Dilantin  and PCP again counseled her on alcohol cessation and reinforced plan to taper off Dilantin .  Subsequently she was found confused at 1 PM by her partner later that day   Interval Hx/subjective  Witnessed seizure activity while talking to nursing at 2 AM: left gaze deviation, blank stare, turned blue, not responsive for 6 min, concern for tongue bite (bloody saliva), gradual improvement over the next several hours  Reports she is very tired due to being frequently awoken At 6 AM, nursing notes her speech is actually better than at the beginning of the shift (less delayed, more fluent) Reports to me she stopped drinking ~1 week prior to presentation; she states she and her partner used to drink together daily but they both stopped due to the health issues she has been having (not consistent with reports to PCP on day of presentation)  Vitals   Vitals:   04/16/23 2043 04/17/23 0101 04/17/23 0213 04/17/23 0307  BP: (!) 123/90 (!) 118/95 (!) 144/94 (!) 142/102  Pulse: 100 (!) 102 (!) 136 100  Resp: 18 18 (!) 24 20  Temp: 98.3 F (36.8 C) 98.3 F (  36.8 C)  98.2 F (36.8 C)  TempSrc: Oral Oral Oral Oral  SpO2: 97% 98%  99%     There is no height or weight on file to calculate BMI.  Physical Exam   HEENT:  [place]/AT Lungs: Respirations unlabored Extremities: Warm and well perfused. Ice pack in place on RUE at site of injection reaction   Neurological Examination Mental Status:  Very sleepy but awakens easily. Fully oriented to January 1st 2025 (wasn't sure of the exact date at first). Fluent casual speech. Follows simple commands at times with brief delay  Cranial Nerves: II, III,IV, VI: Fixates and tracks normally. EOMI. Pupils are equal.     VII: Face is symmetric VIII: Intact to voice IX,X: No hoarseness or hypophonia XI: Symmetric XII: No lingual dysarthria Motor/Cerebellar Cerebellar: No ataxia with FNF bilaterally Gait: Deferred  Medications  Current Facility-Administered Medications:    acetaminophen  (TYLENOL ) tablet 650 mg, 650 mg, Oral, Q6H PRN **OR** acetaminophen  (TYLENOL ) suppository 650 mg, 650 mg, Rectal, Q6H PRN, Gomez-Caraballo, Maria, MD   enoxaparin  (LOVENOX ) injection 40 mg, 40 mg, Subcutaneous, Q24H, Gomez-Caraballo, Maria, MD, 40 mg at 04/16/23 2145   feeding supplement (ENSURE ENLIVE / ENSURE PLUS) liquid 237 mL, 237 mL, Oral, BID BM, Guilloud, Carolyn, MD, 237 mL at 04/16/23 1239   folic acid  (FOLVITE ) tablet 1 mg, 1 mg, Oral, Daily, Gomez-Caraballo, Maria, MD, 1 mg at 04/16/23 1239   levETIRAcetam  (KEPPRA ) tablet 1,500 mg, 1,500 mg, Oral, BID, Alexander-Savino, [place], MD, 1,500 mg at 04/16/23 2145   LORazepam  (ATIVAN ) injection 2 mg, 2 mg, Intramuscular, PRN, Kaitlinn Iversen L, MD   multivitamin with minerals tablet 1 tablet, 1 tablet, Oral, Daily, Gomez-Caraballo, Maria, MD, 1 tablet at 04/16/23 1239   phenytoin  (DILANTIN ) chewable tablet 200 mg, 200 mg, Oral, Daily, Lindzen, Eric, MD, 200 mg at 04/16/23 1240   risperiDONE  (RISPERDAL ) tablet 1 mg, 1 mg, Oral, BID, Alexander-Savino, [place], MD, 1 mg at 04/16/23 2145   thiamine  (VITAMIN B1) tablet 100 mg, 100 mg, Oral, Daily, Norrine Sharper, MD Labs and Diagnostic Imaging   CBC:  Recent Labs  Lab 04/12/23 1447 04/14/23 0510 04/15/23 0721 04/16/23 0624  WBC 7.4   < > 6.8 7.1  NEUTROABS 5.5  --   --   --   HGB 13.9   < > 11.2* 10.7*  HCT 40.5   < > 34.0* 31.6*  MCV 103.3*    < > 103.7* 103.6*  PLT 318   < > 234 243   < > = values in this interval not displayed.    Basic Metabolic Panel:  Lab Results  Component Value Date   NA 136 04/16/2023   K 3.5 04/16/2023   CO2 21 (L) 04/16/2023   GLUCOSE 93 04/16/2023   BUN <5 (L) 04/16/2023   CREATININE 0.48 04/16/2023   CALCIUM  8.5 (L) 04/16/2023   GFRNONAA >60 04/16/2023   GFRAA >60 04/09/2019   Lipid Panel: No results found for: LDLCALC HgbA1c: No results found for: HGBA1C Urine Drug Screen:     Component Value Date/Time   LABOPIA NONE DETECTED 04/13/2023 0015   COCAINSCRNUR NONE DETECTED 04/13/2023 0015   LABBENZ POSITIVE (A) 04/13/2023 0015   AMPHETMU NONE DETECTED 04/13/2023 0015   THCU NONE DETECTED 04/13/2023 0015   LABBARB NONE DETECTED 04/13/2023 0015    Alcohol Level     Component Value Date/Time   ETH <10 04/12/2023 1447   INR  Lab Results  Component Value Date   INR 1.0 04/13/2023  APTT  Lab Results  Component Value Date   APTT 31 11/07/2020   AED levels:  Lab Results  Component Value Date   PHENYTOIN  <2.5 (L) 04/12/2023   LEVETIRACETA 40.8 (H) 04/12/2023    Assessment  Alyssa Little is a 43 y.o. female with PMHx of TBI, seizures, on Keppra  3000 mg per day and Dilantin , the latter currently being tapered off to minimize long term side effects, who presented on Friday with acute onset of receptive aphasia. .  - Exam is significantly improved today, but still with some speech deficits.     - Imaging: - CT head revealed no acute abnormalities. Encephalomalacia was seen in the high anterior frontal lobes bilaterally, unchanged from the prior CT head dated September 19, 2022.  - MRI brain: Evaluation is somewhat limited by motion artifact. Within this limitation, no acute intracranial process. No evidence of acute or subacute infarct. Old bilateral high anterior frontal lobe contusions are seen.  - LTM EEG report (04/13/2023 1155 to 1524): Continuous slow, left hemisphere,  maximal left temporal region. This technically difficult study is suggestive of cortical dysfunction arising from left hemisphere, maximal left temporal region likely secondary to underlying structural abnormality, post-ictal state. No seizures or epileptiform discharges were seen throughout the recording. - LTM EEG was discontinued yesterday -- Labs: - Phenytoin  level < 2.5, consistent with having stopped this medication - Keppra  level pending - Most likely etiology for her presentation is extended postictal aphasia; continuous slowing on EEG is compatible with this. EEG was negative for seizure activity, so partial complex status epilepticus is now unlikely. Stroke has been ruled out with MRI.    - Etiology for breakthrough seizures may be discontinuation of phenytoin  recently but potentially also alcohol withdrawal; etiology of breakthrough seizure today may be poor sleep in the hospital   RECOMMENDATIONS  - IV Ativan  PRN seizure 2 doses ordered, neurology to be notified if this is used  - Inpatient seizure precautions - No indication for EEG at this time as her speech has actually continued to improve since admission - Continue Keppra  at 1500 mg IV BID  - Continue Dilantin  at 200 mg po daily.  Check level today prior to next dose, if in good therapeutic dose would recommend changing dosing to nightly instead of at 10 AM to reduce side effects and promote sleep - Long-term if she needs a second agent as she has failed Depakote  could consider phenobarbital instead, or lamotrigine, but will defer titration to her outpatient neurologist unless she has further events and patient - s/p high-dose empiric thiamine  for a total of 9 doses; continue 100 mg po thiamine  every day.  - Continue CIWA protocol, continue counseling on alcohol cessation and given her underlying epilepsy risk of withdrawal seizures is high so inpatient detox would be recommended - Outpatient seizure precautions: Per    [place] statutes, patients with seizures are not allowed to drive until  they have been seizure-free for six months. Use caution when using heavy equipment or power tools. Avoid working on ladders or at heights. Take showers instead of baths. Ensure the water temperature is not too high on the home water heater. Do not go swimming alone. When caring for infants or small children, sit down when holding, feeding, or changing them to minimize risk of injury to the child in the event you have a seizure. Also, Maintain good sleep hygiene. Avoid alcohol. - Once speech is fully back to baseline, can discharge home with outpatient Neurology follow  up.  - Neurohospitalist service will follow up phenytoin  level and confirm timing change pending level.  Otherwise we will sign off at this time. Please call if there are additional questions, including notification of neurology for any further seizures  Signed, Lola Jernigan MD-PhD [facility] [phone] Available 7 PM to 7 AM, outside of these hours please call Neurologist on call as listed on Amion.  Discussed with Dr. Tobie via phone and secure chat

## 2023-04-18 ENCOUNTER — Other Ambulatory Visit (HOSPITAL_COMMUNITY): Payer: Self-pay

## 2023-04-18 DIAGNOSIS — F1090 Alcohol use, unspecified, uncomplicated: Secondary | ICD-10-CM | POA: Diagnosis not present

## 2023-04-18 DIAGNOSIS — R569 Unspecified convulsions: Secondary | ICD-10-CM

## 2023-04-18 DIAGNOSIS — E440 Moderate protein-calorie malnutrition: Secondary | ICD-10-CM

## 2023-04-18 DIAGNOSIS — G9340 Encephalopathy, unspecified: Secondary | ICD-10-CM | POA: Diagnosis not present

## 2023-04-18 MED ORDER — RISPERIDONE 1 MG PO TABS
1.0000 mg | ORAL_TABLET | Freq: Two times a day (BID) | ORAL | 0 refills | Status: DC
  Filled 2023-04-18: qty 60, 30d supply, fill #0

## 2023-04-18 MED ORDER — PHENYTEK 300 MG PO CAPS
300.0000 mg | ORAL_CAPSULE | Freq: Every day | ORAL | 3 refills | Status: DC
  Filled 2023-04-18 – 2023-05-14 (×2): qty 30, 30d supply, fill #0
  Filled 2023-06-12: qty 30, 30d supply, fill #1
  Filled 2023-12-12: qty 30, 30d supply, fill #2

## 2023-04-18 MED ORDER — ADULT MULTIVITAMIN W/MINERALS CH
1.0000 | ORAL_TABLET | Freq: Every day | ORAL | 3 refills | Status: AC
  Filled 2023-04-18: qty 30, 30d supply, fill #0

## 2023-04-18 MED ORDER — THIAMINE HCL 100 MG PO TABS
100.0000 mg | ORAL_TABLET | Freq: Every day | ORAL | 3 refills | Status: DC
  Filled 2023-04-18: qty 30, 30d supply, fill #0
  Filled 2023-05-16 (×2): qty 29, 29d supply, fill #0
  Filled 2023-06-12: qty 30, 30d supply, fill #0
  Filled 2023-07-15: qty 30, 30d supply, fill #1
  Filled 2023-08-14: qty 30, 30d supply, fill #2

## 2023-04-18 MED ORDER — NALTREXONE HCL 50 MG PO TABS
50.0000 mg | ORAL_TABLET | Freq: Every day | ORAL | 3 refills | Status: DC
  Filled 2023-04-18: qty 30, 30d supply, fill #0

## 2023-04-18 NOTE — Progress Notes (Signed)
 Mobility Specialist Progress Note:    04/18/23 1113  Mobility  Activity Ambulated independently in hallway  Level of Assistance Standby assist, set-up cues, supervision of patient - no hands on  Assistive Device None  Distance Ambulated (ft) 350 ft  Activity Response Tolerated well  Mobility Referral Yes  Mobility visit 1 Mobility  Mobility Specialist Start Time (ACUTE ONLY) 1105  Mobility Specialist Stop Time (ACUTE ONLY) 1112  Mobility Specialist Time Calculation (min) (ACUTE ONLY) 7 min   Pt received in bed, eager to ambulate in hallway, guest at bedside. Ambulated independently w/o AD, SV for safety. Tolerated well, asx throughout. Denies dizziness. Able to answer questions appropriately, no delayed response. Returned pt to room, left with all needs met.   Janiya Millirons Mobility Specialist Please contact via Special Educational Needs Teacher or  Rehab office at [phone]

## 2023-04-18 NOTE — Plan of Care (Signed)
  Problem: Education: Goal: Knowledge of General Education information will improve Description: Including pain rating scale, medication(s)/side effects and non-pharmacologic comfort measures Outcome: Adequate for Discharge   Problem: Health Behavior/Discharge Planning: Goal: Ability to manage health-related needs will improve Outcome: Adequate for Discharge   Problem: Clinical Measurements: Goal: Ability to maintain clinical measurements within normal limits will improve Outcome: Adequate for Discharge Goal: Will remain free from infection Outcome: Adequate for Discharge Goal: Diagnostic test results will improve Outcome: Adequate for Discharge Goal: Respiratory complications will improve Outcome: Adequate for Discharge Goal: Cardiovascular complication will be avoided Outcome: Adequate for Discharge   Problem: Activity: Goal: Risk for activity intolerance will decrease Outcome: Adequate for Discharge   Problem: Nutrition: Goal: Adequate nutrition will be maintained Outcome: Adequate for Discharge   Problem: Coping: Goal: Level of anxiety will decrease Outcome: Adequate for Discharge   Problem: Elimination: Goal: Will not experience complications related to bowel motility Outcome: Adequate for Discharge Goal: Will not experience complications related to urinary retention Outcome: Adequate for Discharge   Problem: Pain Management: Goal: General experience of comfort will improve Outcome: Adequate for Discharge   Problem: Safety: Goal: Ability to remain free from injury will improve Outcome: Adequate for Discharge   Problem: Skin Integrity: Goal: Risk for impaired skin integrity will decrease Outcome: Adequate for Discharge   Problem: Education: Goal: Expressions of having a comfortable level of knowledge regarding the disease process will increase Outcome: Adequate for Discharge   Problem: Coping: Goal: Ability to adjust to condition or change in health will  improve Outcome: Adequate for Discharge Goal: Ability to identify appropriate support needs will improve Outcome: Adequate for Discharge   Problem: Health Behavior/Discharge Planning: Goal: Compliance with prescribed medication regimen will improve Outcome: Adequate for Discharge   Problem: Medication: Goal: Risk for medication side effects will decrease Outcome: Adequate for Discharge   Problem: Clinical Measurements: Goal: Complications related to the disease process, condition or treatment will be avoided or minimized Outcome: Adequate for Discharge Goal: Diagnostic test results will improve Outcome: Adequate for Discharge   Problem: Safety: Goal: Verbalization of understanding the information provided will improve Outcome: Adequate for Discharge   Problem: Self-Concept: Goal: Level of anxiety will decrease Outcome: Adequate for Discharge Goal: Ability to verbalize feelings about condition will improve Outcome: Adequate for Discharge   Problem: Malnutrition  (NI-5.2) Goal: Food and/or nutrient delivery Description: Individualized approach for food/nutrient provision. Outcome: Adequate for Discharge

## 2023-04-18 NOTE — Progress Notes (Signed)
 Occupational Therapy Treatment and Discharge Patient Details Name: Anel Creighton MRN: [id number] DOB: Jul 25, 1980 Today's Date: 04/18/2023   History of present illness 43 y.o. female admitted 12/27 with AMS, receptive aphasia. Past medical history of Alcohol abuse, Hypokalemia (10/19/2022), SAH (subarachnoid hemorrhage), Seizure disorder, TBI (traumatic brain injury), and Transaminitis.   OT comments  Pt has returned to her baseline. Independently toileting, grooming, bathing and dressing. She is aware she is at risk for seizures and plans to have her friend in the home when showering. No further OT needs.       If plan is discharge home, recommend the following:  Assist for transportation   Equipment Recommendations  None recommended by OT    Recommendations for Other Services      Precautions / Restrictions Precautions Precautions: Fall Restrictions Weight Bearing Restrictions Per Provider Order: No       Mobility Bed Mobility Overal bed mobility: Modified Independent                  Transfers Overall transfer level: Independent Equipment used: None                     Balance Overall balance assessment: No apparent balance deficits (not formally assessed)                                         ADL either performed or assessed with clinical judgement   ADL   Eating/Feeding: Independent;Sitting   Grooming: Independent;Standing                   Toilet Transfer: Independent;Ambulation   Toileting- Clothing Manipulation and Hygiene: Independent;Sit to/from stand       Functional mobility during ADLs: Independent General ADL Comments: Pt aware of her risk of seizure, will have friend in home when she is showering.    Extremity/Trunk Assessment              Vision       Perception     Praxis      Cognition Arousal: Alert Behavior During Therapy: WFL for tasks assessed/performed Overall Cognitive  Status: Within Functional Limits for tasks assessed                                          Exercises      Shoulder Instructions       General Comments      Pertinent Vitals/ Pain       Pain Assessment Pain Assessment: No/denies pain  Home Living                                          Prior Functioning/Environment              Frequency           Progress Toward Goals  OT Goals(current goals can now be found in the care plan section)  Progress towards OT goals: Goals met/education completed, patient discharged from OT     Plan      Co-evaluation                 AM-PAC OT 6 Clicks Daily Activity  Outcome Measure   Help from another person eating meals?: None Help from another person taking care of personal grooming?: None Help from another person toileting, which includes using toliet, bedpan, or urinal?: None Help from another person bathing (including washing, rinsing, drying)?: None Help from another person to put on and taking off regular upper body clothing?: None Help from another person to put on and taking off regular lower body clothing?: None 6 Click Score: 24    End of Session    OT Visit Diagnosis: Muscle weakness (generalized) (M62.81)   Activity Tolerance Patient tolerated treatment well   Patient Left in bed;with call bell/phone within reach;with family/visitor present   Nurse Communication          Time: 8880-8865 OT Time Calculation (min): 15 min  Charges: OT General Charges $OT Visit: 1 Visit OT Treatments $Self Care/Home Management : 8-22 mins  Kennth Mliss Helling 04/18/2023, 11:41 AM Mliss HERO, OTR/L Acute Rehabilitation Services Office: [phone]

## 2023-04-18 NOTE — Plan of Care (Signed)
  Problem: Education: Goal: Knowledge of General Education information will improve Description: Including pain rating scale, medication(s)/side effects and non-pharmacologic comfort measures Outcome: Progressing   Problem: Health Behavior/Discharge Planning: Goal: Ability to manage health-related needs will improve Outcome: Progressing   Problem: Clinical Measurements: Goal: Ability to maintain clinical measurements within normal limits will improve Outcome: Progressing Goal: Will remain free from infection Outcome: Progressing Goal: Diagnostic test results will improve Outcome: Progressing Goal: Respiratory complications will improve Outcome: Progressing Goal: Cardiovascular complication will be avoided Outcome: Progressing   Problem: Activity: Goal: Risk for activity intolerance will decrease Outcome: Progressing   Problem: Nutrition: Goal: Adequate nutrition will be maintained Outcome: Progressing   Problem: Coping: Goal: Level of anxiety will decrease Outcome: Progressing   Problem: Elimination: Goal: Will not experience complications related to bowel motility Outcome: Progressing Goal: Will not experience complications related to urinary retention Outcome: Progressing   Problem: Pain Management: Goal: General experience of comfort will improve Outcome: Progressing   Problem: Safety: Goal: Ability to remain free from injury will improve Outcome: Progressing   Problem: Skin Integrity: Goal: Risk for impaired skin integrity will decrease Outcome: Progressing   Problem: Education: Goal: Expressions of having a comfortable level of knowledge regarding the disease process will increase Outcome: Progressing   Problem: Coping: Goal: Ability to adjust to condition or change in health will improve Outcome: Progressing Goal: Ability to identify appropriate support needs will improve Outcome: Progressing   Problem: Health Behavior/Discharge Planning: Goal:  Compliance with prescribed medication regimen will improve Outcome: Progressing   Problem: Medication: Goal: Risk for medication side effects will decrease Outcome: Progressing   Problem: Clinical Measurements: Goal: Complications related to the disease process, condition or treatment will be avoided or minimized Outcome: Progressing Goal: Diagnostic test results will improve Outcome: Progressing   Problem: Safety: Goal: Verbalization of understanding the information provided will improve Outcome: Progressing   Problem: Self-Concept: Goal: Level of anxiety will decrease Outcome: Progressing Goal: Ability to verbalize feelings about condition will improve Outcome: Progressing

## 2023-04-18 NOTE — Discharge Summary (Signed)
 Name: Alyssa Little MRN: [id number] DOB: 1980/10/11 43 y.o. PCP: Kenn Pagan, DO  Date of Admission: 04/12/2023  2:16 PM Date of Discharge: 04/18/2023 1:18 PM Attending Physician: Dr. CHARLENA Eastern  Discharge Diagnosis: Active Problems:   Seizures (HCC)   Alcohol use disorder   TBI (traumatic brain injury) (HCC)   Protein-calorie malnutrition (HCC)   Malnutrition of moderate degree    Discharge Medications: Allergies as of 04/18/2023       Reactions   Shellfish Allergy Anaphylaxis   Flagyl  [metronidazole ] Hives   Penicillins Hives, Rash, Other (See Comments)   Tolerated cephalosporins including ancef  Has patient had a PCN reaction causing immediate rash, facial/tongue/throat swelling, SOB or lightheadedness with hypotension: Unknown Has patient had a PCN reaction causing severe rash involving mucus membranes or skin necrosis: Unknown Has patient had a PCN reaction that required hospitalization: Unknown Has patient had a PCN reaction occurring within the last 10 years: Unknown If all of the above answers are NO, then may        Medication List     STOP taking these medications    phenytoin  100 MG ER capsule Commonly known as: Dilantin  Replaced by: Phenytek  300 MG ER capsule       TAKE these medications    Azelaic Acid  15 % gel Apply 1 Application topically 2 (two) times daily. After skin is thoroughly washed and patted dry, gently but thoroughly massage a thin film of azelaic acid  cream into the affected area twice daily, in the morning and evening.   CertaVite/Antioxidants Tabs Take 1 tablet by mouth daily.   folic acid  1 MG tablet Commonly known as: FOLVITE  Take 1 tablet (1 mg total) by mouth daily.   hydrOXYzine  10 MG tablet Commonly known as: ATARAX  Take 1 tablet (10 mg total) by mouth 3 (three) times daily as needed.   ibuprofen 200 MG tablet Commonly known as: ADVIL Take 200-400 mg by mouth every 6 (six) hours as needed for cramping or  headache.   levETIRAcetam  500 MG 24 hr tablet Commonly known as: KEPPRA  XR Take 6 tablets (3,000 mg total) by mouth at bedtime.   naltrexone  50 MG tablet Commonly known as: DEPADE Take 1 tablet (50 mg total) by mouth daily.   Phenytek  300 MG ER capsule Generic drug: phenytoin  Take 1 capsule (300 mg total) by mouth at bedtime. Replaces: phenytoin  100 MG ER capsule   risperiDONE  1 MG tablet Commonly known as: RISPERDAL  Take 1 tablet (1 mg total) by mouth 2 (two) times daily.   thiamine  100 MG tablet Commonly known as: VITAMIN B1 Take 1 tablet (100 mg total) by mouth daily.   TYLENOL  500 MG tablet Generic drug: acetaminophen  Take 500-1,000 mg by mouth every 6 (six) hours as needed for mild pain (or headaches).        Disposition and follow-up:   Ms.Malicia Lamesha Tibbits was discharged from [facility] in Stable condition.  At the hospital follow up visit please address:  1.  Follow-up:  *Seizure disorder -Please note that patient was continued on her home regimen of Keppra  3000 mg every night and phenytoin  300 mg every night.  Please note that the phenytoin  total daily dose of 300 mg has not changed, the 100 mg extended capsule that she was taking 3 of was replaced by 1 capsule of 300 mg of phenytoin  -Ensure patient is taking medications properly, we provided counseling on the importance of taking her AEDs -Ensure patient follows up with neurologist through  [facility]  *Alcohol Use Disorder  -Note that we started patient on thiamine  and folate outside of the hospital. -Note that patient was started on naltrexone , encourage patient to use  -Assess patient for alcohol use and provide resources for cessation  *Psychosis  -Ensure patient is following with psychiatry -Assess for SI/HI -Encourage patient to take risperidone  1 mg twice a day    *Macrocytic anemia -Repeat CBC   *Protein calorie malnutrition -Note that we started patient on thiamine   and folate outside of the hospital.    2.  Labs / imaging needed at time of follow-up: CBC  3.  Pending labs/ test needing follow-up: N/A  4.  Medication Changes  STOPPED  -N/A   ADDED  -Thiamine  100 mg   -Folic acid    -Multivitamin daily  -Risperidone  1 mg BID    MODIFIED  -Phenytoin  (Phenytek ) 100 mg capsule changed to one 300 mg tablet (SAME total daily dose of 300 mg)   Follow-up Appointments:  Follow-up Information     Connect with your PCP/Specialist as discussed. Schedule an appointment as soon as possible for a visit .   Contact information: [email or web address] Call our physician referral line at [phone].        Masters, Psychiatric Nurse, DO Follow up in 1 week(s).   Specialty: Internal Medicine Contact information: [address] [place] [address] [phone]         [facility]. Go in 1 week(s).   Why: Report to the 2nd floor to be seen as a walk-in   New Patient Assessment/Therapy Walk-Ins: Monday and Wednesday: 8 am until slots are full. Every 1st and 2nd Fridays of the month: 1 pm - 5 pm. NO ASSESSMENT/THERAPY WALK-INS ON TUESDAYS OR THURSDAYS New Patient Assessment/Medication Management Walk-Ins: Monday - Friday: 8 am - 11 am. For all walk-ins, we ask that you arrive by 7:00 am because patients will be seen in the order of arrival. Availability is limited; therefore, you may not be seen on the same day that you walk-in. Our goal is to serve and meet the needs of our community to the best of our ability. Contact information: [address] [place] [address]   Phone number: [phone] phone                Hospital Course by problem list: Active Problems:   Seizures (HCC)   Alcohol use disorder   TBI (traumatic brain injury) (HCC)   Protein-calorie malnutrition (HCC)   Malnutrition of moderate degree  Principal Problem (Resolved):   Acute encephalopathy Resolved Problems:    Macrocytosis without anemia   Malnutrition of moderate degree  Consults: -neurology -psychiatry  Procedures: -eeg  Follow-up items: -phenytoin  taper  Acute encephalopathy Jalise Zawistowski initially presented to the emergency department for confusion.  Her husband brought her in after he found her to be abnormal at home.  There were no witnessed seizures, falls, or other inciting events.  She had dense receptive aphasia, seemingly tangential thought processes, and may have been responding to internal stimuli.  Acute stroke was ruled out.  She was evaluated by neurology and psychiatry.  She was treated for prolonged postictal dysphagia and acute psychosis with underlying traumatic brain injury contributing.  Warnicke encephalopathy was considered.  She improved day by day and was discharged on hospital day 5 with her speech and thought processes back to normal.  Alcohol use disorder Reports drinking upwards of 3 beers per day.  She was observed with CIWA protocol  but only had mild clinically significant withdrawal that abated after a day or so.  Warnicke encephalopathy was considered and she was also treated with high-dose thiamine .  Alcohol withdrawal syndrome was not thought to be a major contributor to her initial presentation.  She was discharged on naltrexone  and reports intent to abstain from alcohol.  Seizures Suspect she had a seizure at home that triggered her encephalopathic episode.  She takes levetiracetam  and Dilantin , the latter was being tapered to off in the outpatient setting but on admission her Dilantin  levels were undetectable.  Levetiracetam  and Dilantin  were restarted on admission.  She did have a seizure overnight on hospital day 3.  She was only briefly postictal and in fact seemed closer to baseline afterwards then she was prior to the seizure, according to her husband.  Dilantin  was increased to 300 mg nightly.  The plan is to do a slow taper of Dilantin  guided by  outpatient neurology as the side effects of this medicine are not pleasant to this person.  History of traumatic brain injury Thought to contribute to decreased seizure threshold, acute psychotic disorder.  Psychiatry was consulted because of concern for acute psychosis.  They started the patient on Risperdal , 1 mg twice daily which she tolerated well.  She will continue this medicine outside of the hospital and follow-up with psychiatry for continued medication management and outpatient taper.  Protein calorie malnutrition Of moderate degree.  Continue thiamine  and folate outside of the hospital.       Discharge Subjective: Patient sitting on her bed we entered the room, she reported feeling good this morning and is ready to go home.  She understands the importance of taking her Keppra  and phenytoin  as instructed and she also understands importance of following up with her neurologist and becoming established with psychiatry.  Her boyfriend was at bedside and was provided the discharge instructions with medications as well.  Discharge Exam:   Blood pressure 111/81, pulse (!) 102, temperature 98.1 F (36.7 C), temperature source Oral, resp. rate 18, SpO2 95%.  Constitutional:Well-appearing, sitting in bed, in no acute distress HENT: normocephalic atraumatic Cardiovascular: regular rate and rhythm, no m/r/g Pulmonary/Chest: normal work of breathing on room air Neurological: alert & oriented x 3 Skin: warm and dry Psych: Normal mood and affect  Pertinent Labs, Studies, and Procedures:     Latest Ref Rng & Units 04/17/2023    8:27 AM 04/16/2023    6:24 AM 04/15/2023    7:21 AM  CBC  WBC 4.0 - 10.5 K/uL 7.6  7.1  6.8   Hemoglobin 12.0 - 15.0 g/dL 89.5  89.2  88.7   Hematocrit 36.0 - 46.0 % 31.5  31.6  34.0   Platelets 150 - 400 K/uL 229  243  234        Latest Ref Rng & Units 04/17/2023    8:27 AM 04/16/2023    6:24 AM 04/15/2023    7:21 AM  CMP  Glucose 70 - 99 mg/dL 899  93  92    BUN 6 - 20 mg/dL 5  <5  <5   Creatinine 0.44 - 1.00 mg/dL 9.63  9.51  9.51   Sodium 135 - 145 mmol/L 136  136  137   Potassium 3.5 - 5.1 mmol/L 3.5  3.5  3.9   Chloride 98 - 111 mmol/L 106  108  110   CO2 22 - 32 mmol/L 22  21  20    Calcium  8.9 - 10.3 mg/dL 8.6  8.5  8.5     US  Abdomen Complete Result Date: 04/13/2023 CLINICAL DATA:  Possible cirrhosis EXAM: ABDOMEN ULTRASOUND COMPLETE COMPARISON:  09/19/2022 FINDINGS: Gallbladder: No gallstones or wall thickening visualized. No sonographic Murphy sign noted by sonographer. Common bile duct: Not well visualized Liver: Mildly increased in echogenicity consistent with fatty infiltration. This is similar to that seen on prior CT examination. Portal vein is patent on color Doppler imaging with normal direction of blood flow towards the liver. IVC: No abnormality visualized. Pancreas: Not well visualized. Spleen: Size and appearance within normal limits. Right Kidney: Length: 8.9 cm. Echogenicity within normal limits. No mass or hydronephrosis visualized. Left Kidney: Length: 10.2 cm. Echogenicity within normal limits. No mass or hydronephrosis visualized. Abdominal aorta: No aneurysm visualized. Other findings: No evidence of ascites is noted. IMPRESSION: Mild fatty infiltration of the liver. No other focal abnormality is seen. Electronically Signed   By: Oneil Devonshire M.D.   On: 04/13/2023 00:09   MR BRAIN WO CONTRAST Result Date: 04/12/2023 CLINICAL DATA:  Altered mental status, stroke suspected, history of seizures EXAM: MRI HEAD WITHOUT CONTRAST TECHNIQUE: Multiplanar, multiecho pulse sequences of the brain and surrounding structures were obtained without intravenous contrast. COMPARISON:  03/26/2019 MRI head, 04/12/2023 CT head FINDINGS: Evaluation is somewhat limited by motion artifact. Brain: No restricted diffusion to suggest acute or subacute infarct. No acute hemorrhage, mass, mass effect, or midline shift. No hydrocephalus or definite  collection. Cerebral volume is less than expected for age. Vascular: Unable to evaluate. Skull and upper cervical spine: Grossly normal marrow signal. Sinuses/Orbits: No acute finding. IMPRESSION: Evaluation is somewhat limited by motion artifact. Within this limitation, no acute intracranial process. No evidence of acute or subacute infarct. Electronically Signed   By: Donald Campion M.D.   On: 04/12/2023 21:31   EEG adult Result Date: 04/12/2023 Shelton Arlin KIDD, MD     04/13/2023  8:32 AM Patient Name: Moneisha Vosler MRN: [id number] Epilepsy Attending: Arlin KIDD Shelton Referring Physician/Provider: Dr Camellia Shark Date: 04/12/2023 Duration: 24.13 mins Patient history: 43yo F with ams getting eeg to evaluate for seizure Level of alertness: Awake AEDs during EEG study: Ativan  Technical aspects: This EEG study was done with scalp electrodes positioned according to the 10-20 International system of electrode placement. Electrical activity was reviewed with band pass filter of 1-70Hz , sensitivity of 7 uV/mm, display speed of 27mm/sec with a 60Hz  notched filter applied as appropriate. EEG data were recorded continuously and digitally stored.  Video monitoring was available and reviewed as appropriate. Description: EEG showed continuous generalized 3 to 6 Hz theta-delta slowing. Hyperventilation and photic stimulation were not performed.   Of note, eeg was technically difficult due to significant myogenic artifact. ABNORMALITY - Continuous slow, generalized IMPRESSION: This technically difficult study is suggestive of moderate diffuse encephalopathy. No seizures or epileptiform discharges were seen throughout the recording. Priyanka KIDD Shelton   CT HEAD WO CONTRAST ( ) Result Date: 04/12/2023 CLINICAL DATA:  Mental status change, unknown cause EXAM: CT HEAD WITHOUT CONTRAST TECHNIQUE: Contiguous axial images were obtained from the base of the skull through the vertex without intravenous contrast.  RADIATION DOSE REDUCTION: This exam was performed according to the departmental dose-optimization program which includes automated exposure control, adjustment of the mA and/or kV according to patient size and/or use of iterative reconstruction technique. COMPARISON:  CT head September 19, 2022. FINDINGS: Brain: No evidence of acute infarction, hemorrhage, hydrocephalus, extra-axial collection or mass lesion/mass effect. Encephalomalacia in the high anterior frontal lobes bilaterally is  unchanged. Vascular: No hyperdense vessel identified. Skull: No acute fracture. Sinuses/Orbits: Clear sinuses.  No acute orbital findings. Other: No mastoid effusions. IMPRESSION: 1. No evidence of acute intracranial abnormality. 2. Similar encephalomalacia in the high anterior frontal lobes, likely posttraumatic. Electronically Signed   By: Gilmore GORMAN Molt M.D.   On: 04/12/2023 15:32     Discharge Instructions: Discharge Instructions     Call MD for:  difficulty breathing, headache or visual disturbances   Complete by: As directed    Call MD for:  extreme fatigue   Complete by: As directed    Call MD for:  hives   Complete by: As directed    Call MD for:  persistant dizziness or light-headedness   Complete by: As directed    Call MD for:  persistant nausea and vomiting   Complete by: As directed    Call MD for:  redness, tenderness, or signs of infection (pain, swelling, redness, odor or green/yellow discharge around incision site)   Complete by: As directed    Call MD for:  severe uncontrolled pain   Complete by: As directed    Call MD for:  temperature >100.4   Complete by: As directed    Diet - low sodium heart healthy   Complete by: As directed    Discharge instructions   Complete by: As directed    You came to the hospital for encephalopathy and we treated you with seizure medications.    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  It is imperative that you follow through with treatment recommendations within 5-7 days from the of discharge to mitigate further risk to your safety and mental well-being. A list of referrals has been provided below to get you started.  You are not limited to the list provided.  In case of an urgent crisis, you may contact the Mobile Crisis Unit with [facility] at [phone].  [facility] [address], [place], [address] [phone] phone   New Patient Assessment/Therapy Walk-Ins:  Monday and Wednesday: 8 am until slots are full. Every 1st and 2nd Fridays of the month: 1 pm - 5 pm.  NO ASSESSMENT/THERAPY WALK-INS ON TUESDAYS OR THURSDAYS  New Patient Assessment/Medication Management Walk-Ins:  Monday - Friday:  8 am - 11  am.  For all walk-ins, we ask that you arrive by 7:00 am because patients will be seen in the order of arrival.  Availability is limited; therefore, you may not be seen on the same day that you walk-in.  Our goal is to serve and meet the needs of our community to the best of our ability.   If you have any of these following symptoms, please call us  or seek care at an emergency department: -Chest Pain -Difficulty Breathing -Syncope (passing out) -Drooping of face -Slurred speech -Sudden weakness in your leg or arm -Fever -Chills   We are glad that you are feeling better, it was a pleasure to care for you!  Damien Lease DO   Increase activity slowly   Complete by: As directed        Signed: Damien Lease DO [facility] - PGY1 Pager: [phone] 04/18/2023, 1:18 PM    Please contact the on call pager after 5 pm and on weekends at [phone].

## 2023-04-18 NOTE — Consult Note (Addendum)
 [facility] Psychiatric Consult Follow-up  Patient Name: .Alyssa Little  MRN: [id number]  DOB: [birth year]  Consult Order details:  Orders (From admission, onward)     Start     Ordered   04/13/23 1024  IP CONSULT TO PSYCHIATRY       Comments: Patient came in with acute AMS. Work-up negative for stroke, hx of seizures does not seem post-ictal, and alcohol use do, has nystagmus. Tangential not following commands. Perseverating on her different names  Ordering Provider: Volney Leash, MD  Provider:  (Not yet assigned)  Question Answer Comment  Location [facility]   Reason for Consult? AMS      04/13/23 1029            Mode of Visit: In person   Psychiatry Consult Evaluation  Service Date: April 18, 2023 LOS:  LOS: 5 days  Chief Complaint I don't understand what you're doing here.  Primary Psychiatric Diagnoses  Post-ictal 2/2 seizure disorder 2.  Alcohol use disorder   Assessment  Alyssa Little is a 43 y.o. female admitted: Presented to the EDfor 04/12/2023  2:16 PM for AMS. She has no prior psychiatric diagnoses and has a past medical history of Alcohol abuse, SAH (subarachnoid hemorrhage), Seizure disorder, TBI (traumatic brain injury).   Her initial presentation of acute onset of inappropriate laughter, internal preoccupation, and tangential thought process is most consistent with acute psychotic disorder likely secondary to TBI. Patient is confused and unable to follow command which is consistent with post ictal state in a patient with seizure disorder. Patient is not currently taking any outpatient psychotropic medications and has never seen a psychiatrist in her entire life. On initial examination, patient appears confused,disorganized and is not oriented to time, place, person or situation.   Today patient appears to continue to improve.Denies issues with appetite and sleep is intermittently disrupted based on  environment and frequent checks by staffing. Patient fully oriented to person, place and situation. Significant other bedside for support throughout interview. Denies minimal depression and moderate anxiety symptoms. Anxiety centered around being in a safe environment. States a security breech was called yesterday and they suspected a person in the hospital had a weapon.  Denies SI/HI. Prior to hospitalization patient heard car beeps, but frequency has decreased. Patient attempting to get disability and is unemployed. Discussed current medication regimen and recommended to patient and partner to follow-up at [facility] for walk-in hours for continued medication management and therapeutic services.  Diagnoses:  Active Hospital problems: Active Problems:   Seizures (HCC)   Alcohol use disorder   TBI (traumatic brain injury) (HCC)   Protein-calorie malnutrition (HCC)    Plan   ## Psychiatric Medication Recommendations:  -Continue Risperidone  to 1 mg twice daily -Continue Keppra  1500 mg twice daily and dilantin  for Seizure disorder per neurology -Continue Thiamine , Folic acid  and multivitamins.  -Continue other medical treatment   ## Medical Decision Making Capacity: Not specifically addressed in this encounter  ## Further Work-up:  Check B12, Folate, HIV, Syphilis, ANA.  -- most recent EKG on 04/12/23 had QtC of 461 -- Pertinent labwork reviewed earlier this admission includes:sodium-128, AST-91, ALT-62.  Note: Brain MRI is unremarkable.   ## Disposition:-- Patient will benefit from outpatient psychiatric follow up upon discharge.   ## Behavioral / Environmental: -Delirium Precautions: Delirium Interventions for Nursing and Staff: - RN to open blinds every AM. - To Bedside: Glasses, hearing aide, and pt's own shoes. Make available to patients. when possible and  encourage use. - Encourage po fluids when appropriate, keep fluids within reach. - OOB to chair with meals. - Passive ROM exercises to  all extremities with AM & PM care. - RN to assess orientation to person, time and place QAM and PRN. - Recommend extended visitation hours with familiar family/friends as feasible. - Staff to minimize disturbances at night. Turn off television when pt asleep or when not in use.    ## Safety and Observation Level:  - Based on my clinical evaluation, I estimate the patient to be at mild risk of self harm in the current setting. - At this time, we recommend  routine. This decision is based on my review of the chart including patient's history and current presentation, interview of the patient, mental status examination, and consideration of suicide risk including evaluating suicidal ideation, plan, intent, suicidal or self-harm behaviors, risk factors, and protective factors. This judgment is based on our ability to directly address suicide risk, implement suicide prevention strategies, and develop a safety plan while the patient is in the clinical setting. Please contact our team if there is a concern that risk level has changed.  CSSR Risk Category:C-SSRS RISK CATEGORY: No Risk  Suicide Risk Assessment: Patient has following modifiable risk factors for suicide: medication noncompliance, which we are addressing by prescribing medication. Patient has following non-modifiable or demographic risk factors for suicide:  Patient has the following protective factors against suicide: Supportive family  Thank you for this consult request. Recommendations have been communicated to the primary team.  We will sign off at this time.   Alyssa OLDEN, MD, PGY-1       History of Present Illness  Relevant Aspects of Hospital ED Course:  Admitted on 04/12/2023 for AMS. Patient have past medical history of Alcohol abuse,SAH (subarachnoid hemorrhage), Seizure disorder, TBI (traumatic brain injury), and Transaminitis, who presented from home to the [facility] on Friday afternoon for evaluation after her husband found her  to be extremely confused at 1300. On arrival to the ED she was struggling to follow commands and had inappropriatefd words. She was felt most likely to be postictal from an unwitnessed seizure at home. CT head revealed no acute abnormalities. Encephalomaln ascia was seen in the high anterior frontal lobes bilaterally, unchanged from the prior CT head dated September 19, 2022. SABRAShe has a longstanding history of seizures. She is currently being switched over to Keppra  from Dilantin , the latter being tapered. Neurology consulted, suspect extended post-ictal aphasia.  Patient compliant with risperdal , continued on keppra  and dilantin  with slow improvement. On 1/1 patient had episode of seizure activity ~6 min, she suddenly stopped speaking, may have bit her tongue and with unstable vitals (tachycardia, hypertension).   Chart review:  VSS. BP stable. Compliant with medications. No PRNs.   Patient Report:  Today patient appears to continue to improve.Denies issues with appetite and sleep is intermittently disrupted based on environment and frequent checks by staffing. Patient fully oriented to person, place and situation. Significant other bedside for support throughout interview. Denies minimal depression and moderate anxiety symptoms. Anxiety centered around being in a safe environment. States a security breech was called yesterday and they suspected a person in the hospital had a weapon.  Denies SI/HI. Prior to hospitalization patient heard car beeps, but frequency has decreased. Patient attempting to get disability and is unemployed. Discussed current medication regimen and recommended to patient and partner to follow-up at [facility] for walk-in hours for continued medication management and therapeutic services.   Psych  ROS:  Depression: partner denies Anxiety: reported some anxiety prior to coming to the hospital Mania (lifetime and current): denied Psychosis: (lifetime and current): denied  Collateral  information:  Partner at bedside. Notes patient has been making slow improvement. Have not observed patient responding to internal or external stimuli though notes disorientation at times and difficulty with recall. Reports she has been drinking at least 3 beers per day for the last 10 years and recently has also not been eating.  Collateral information from the patient's husband/partner at bedside Marshal Maier [phone]) indicates that patient suddenly became confused and was struggling to follow command at around noon on 04/12/23. He reports that patient was spaced out, not verbalizing, and did not understand his conversation. He denies any prior psychiatric diagnoses or ongoing psychotropic medication. He denies any prior psychotic symptoms including hallucination, paranoia or disorganized behavior. Partner equally denies depressive or anxiety symptoms. Partner/husband reports that patient has a Seizure disorder and drinks 3 beers of alcohol daily. She currently takes Keppra  prescribed by her Neurologist and has been compliant. She used to take Dilantin  but the Neurologist decided to switch her to Keppra  recently.   ROS   Psychiatric and Social History  Psychiatric History:  Information collected from patient and husband.   Prev Dx/Sx: none  Current Psych Provider: none  Home Meds (current): none  Previous Med Trials: none  Therapy: Partner reports patient used to see a therapist but is not sure if she received any psychiatric diagnoses.  Prior Psych Hospitalization: denies   Prior Self Harm: denies  Prior Violence: denies   Family Psych History: denies  Family Hx suicide: denies   Social History:  Developmental Hx: unsure  Educational Hx: unsure  Occupational Hx: unsure  Legal Hx: unsure Living Situation: Currently lives with partner Spiritual Hx: unsure Access to weapons/lethal means: denies    Substance History Alcohol: Yes. Drinks 3 beer bottle daily.   Type of alcohol  Beer Last Drink Yesterday Number of drinks per day 3 bottles  History of alcohol withdrawal seizures. Yes History of DT's Unsure  Tobacco: denies  Illicit drugs: denies  Prescription drug abuse: denies  Rehab hx: denies   Exam Findings  Physical Exam: Vital Signs:  Temp:  [97.8 F (36.6 C)-98.2 F (36.8 C)] 98.1 F (36.7 C) (01/02 0544) Pulse Rate:  [79-102] 102 (01/02 0544) Resp:  [18] 18 (01/02 0544) BP: (111-114)/(77-85) 111/81 (01/02 0544) SpO2:  [95 %-100 %] 95 % (01/02 0544) Blood pressure 111/81, pulse (!) 102, temperature 98.1 F (36.7 C), temperature source Oral, resp. rate 18, SpO2 95%. There is no height or weight on file to calculate BMI.  Physical Exam Constitutional:      Appearance: the patient is not toxic-appearing.  Pulmonary:     Effort: Pulmonary effort is normal.  Neurological:     General: No focal deficit present.     Mental Status: the patient is alert and oriented to person and time.   Review of Systems  Respiratory:  Negative for shortness of breath.   Cardiovascular:  Negative for chest pain.  Gastrointestinal:  Negative for abdominal pain, constipation, diarrhea, nausea and vomiting.  Neurological:  Negative for headaches.   Mental Status Exam: General Appearance: Fairly Groomed  Orientation:  Full (Time, Place, and Person)  Memory:  Recent;   Fair  Concentration:  Attention Span: Poor  Recall:  Fair  Attention  Fair  Eye Contact:  Good  Speech:  Normal Rate  Language:  Fair  Volume:  Normal  Mood: Better than it has been  Affect:  Constricted  Thought Process:  Coherent  Thought Content:  Logical  Suicidal Thoughts:  No  Homicidal Thoughts:  No  Judgement:  Fair  Insight:  Present  Psychomotor Activity:  Normal  Akathisia:  Negative  Fund of Knowledge:  Fair      Assets:  Social Support  Cognition:  Impaired,  Moderate  ADL's:  Impaired  AIMS (if indicated):        Other History   These have been pulled in through  the EMR, reviewed, and updated if appropriate.  Family History:  The patient's Family history is unknown by patient.  Medical History: Past Medical History:  Diagnosis Date  . Alcohol abuse   . Hypokalemia 10/19/2022  . SAH (subarachnoid hemorrhage) (HCC)   . Seizure disorder (HCC)   . TBI (traumatic brain injury) (HCC)   . Transaminitis     Surgical History: Past Surgical History:  Procedure Laterality Date  . CHOLECYSTECTOMY       Medications:   Current Facility-Administered Medications:  .  acetaminophen  (TYLENOL ) tablet 650 mg, 650 mg, Oral, Q6H PRN **OR** acetaminophen  (TYLENOL ) suppository 650 mg, 650 mg, Rectal, Q6H PRN, Gomez-Caraballo, Maria, MD .  enoxaparin  (LOVENOX ) injection 40 mg, 40 mg, Subcutaneous, Q24H, Gomez-Caraballo, Maria, MD, 40 mg at 04/17/23 2150 .  feeding supplement (ENSURE ENLIVE / ENSURE PLUS) liquid 237 mL, 237 mL, Oral, BID BM, Guilloud, Carolyn, MD, 237 mL at 04/16/23 1239 .  folic acid  (FOLVITE ) tablet 1 mg, 1 mg, Oral, Daily, Gomez-Caraballo, Maria, MD, 1 mg at 04/17/23 1041 .  levETIRAcetam  (KEPPRA ) tablet 1,500 mg, 1,500 mg, Oral, BID, Alexander-Savino, Washington, MD, 1,500 mg at 04/17/23 2149 .  LORazepam  (ATIVAN ) injection 2 mg, 2 mg, Intramuscular, PRN, Bhagat, Srishti L, MD .  multivitamin with minerals tablet 1 tablet, 1 tablet, Oral, Daily, Gomez-Caraballo, Maria, MD, 1 tablet at 04/17/23 1041 .  naltrexone  (DEPADE) tablet 50 mg, 50 mg, Oral, Daily, Alexander-Savino, Washington, MD, 50 mg at 04/17/23 1356 .  phenytoin  (DILANTIN ) chewable tablet 300 mg, 300 mg, Oral, QHS, McLendon, Michael, MD .  risperiDONE  (RISPERDAL ) tablet 1 mg, 1 mg, Oral, BID, Alexander-Savino, Washington, MD, 1 mg at 04/17/23 2149 .  thiamine  (VITAMIN B1) tablet 100 mg, 100 mg, Oral, Daily, McLendon, Michael, MD, 100 mg at 04/17/23 1041  Allergies: Allergies  Allergen Reactions  . Shellfish Allergy Anaphylaxis  . Flagyl  [Metronidazole ] Hives  . Penicillins Hives,  Rash and Other (See Comments)    Tolerated cephalosporins including ancef   Has patient had a PCN reaction causing immediate rash, facial/tongue/throat swelling, SOB or lightheadedness with hypotension: Unknown Has patient had a PCN reaction causing severe rash involving mucus membranes or skin necrosis: Unknown Has patient had a PCN reaction that required hospitalization: Unknown Has patient had a PCN reaction occurring within the last 10 years: Unknown If all of the above answers are NO, then may    Alyssa Harbeson, MD, PGY-1

## 2023-04-18 NOTE — Progress Notes (Signed)
 Explained discharge instructions to patient. Reviewed follow up appointment and next medication administration times. Also reviewed education. Patient verbalized having an understanding for instructions given. All belongings are in the patient's possession. Will pick up patient's TOC meds prior to discharge. IV and telemetry were removed. CCMD was notified. No other needs verbalized. Will transport downstairs for discharge.

## 2023-04-19 ENCOUNTER — Telehealth: Payer: Self-pay

## 2023-04-19 NOTE — Transitions of Care (Post Inpatient/ED Visit) (Signed)
   04/19/2023  Name: Alyssa Little MRN: [id number] DOB: May 18, 1980  Today's TOC FU Call Status: Today's TOC FU Call Status:: Unsuccessful Call (1st Attempt) Unsuccessful Call (1st Attempt) Date: 04/19/23  Attempted to reach the patient regarding the most recent Inpatient/ED visit.  Follow Up Plan: Additional outreach attempts will be made to reach the patient to complete the Transitions of Care (Post Inpatient/ED visit) call.   Signature Julian Lemmings, LPN [facility] Nurse Health Advisor Direct Dial [phone]

## 2023-04-19 NOTE — Progress Notes (Signed)
  Progress Note   Date: 04/18/2023  Patient Name: Alyssa Little        MRN#: [id number]  Clarification of diagnosis:  Moderate malnutrition

## 2023-04-22 NOTE — Transitions of Care (Post Inpatient/ED Visit) (Signed)
   04/22/2023  Name: Alyssa Little MRN: [id number] DOB: September 23, 1980  Today's TOC FU Call Status: Today's TOC FU Call Status:: Unsuccessful Call (2nd Attempt) Unsuccessful Call (1st Attempt) Date: 04/19/23 Unsuccessful Call (2nd Attempt) Date: 04/22/23  Attempted to reach the patient regarding the most recent Inpatient/ED visit.  Follow Up Plan: Additional outreach attempts will be made to reach the patient to complete the Transitions of Care (Post Inpatient/ED visit) call.   Signature Julian Lemmings, LPN [facility] Nurse Health Advisor Direct Dial [phone]

## 2023-04-23 NOTE — Transitions of Care (Post Inpatient/ED Visit) (Signed)
 04/23/2023  Name: Alyssa Little MRN: [id number] DOB: 08-28-1980  Today's TOC FU Call Status: Today's TOC FU Call Status:: Successful TOC FU Call Completed Unsuccessful Call (1st Attempt) Date: 04/19/23 Unsuccessful Call (2nd Attempt) Date: 04/22/23 [facility] FU Call Complete Date: 04/23/23 Patient's Name and Date of Birth confirmed.  Transition Care Management Follow-up Telephone Call Date of Discharge: 04/18/23 Discharge Facility: [facility]) Type of Discharge: Inpatient Admission Primary Inpatient Discharge Diagnosis:: altered mental status How have you been since you were released from the hospital?: Better Any questions or concerns?: No  Items Reviewed: Did you receive and understand the discharge instructions provided?: Yes Medications obtained,verified, and reconciled?: Yes (Medications Reviewed) Any new allergies since your discharge?: No Dietary orders reviewed?: Yes Do you have support at home?: Yes People in Home: significant other  Medications Reviewed Today: Medications Reviewed Today     Reviewed by Emmitt Pan, LPN (Licensed Practical Nurse) on 04/23/23 at 1538  Med List Status: <None>   Medication Order Taking? Sig Documenting Provider Last Dose Status Informant  Azelaic Acid  15 % gel [id number]  Apply 1 Application topically 2 (two) times daily. After skin is thoroughly washed and patted dry, gently but thoroughly massage a thin film of azelaic acid  cream into the affected area twice daily, in the morning and evening. Fernand Prost, MD  Active Spouse/Significant Other, Pharmacy Records           Med Note ROZENA, WAYLON   Fri Apr 12, 2023  9:18 PM) Patient has not started medication   folic acid  (FOLVITE ) 1 MG tablet [id number]  Take 1 tablet (1 mg total) by mouth daily. Fernand Prost, MD  Active Spouse/Significant Other, Pharmacy Records           Med Note ROZENA, SEBASTIAN   Fri Apr 12, 2023  9:19 PM) Patient has not started medication   hydrOXYzine  (ATARAX ) 10 MG tablet [id number] No Take 1 tablet (10 mg total) by mouth 3 (three) times daily as needed. Fernand Prost, MD Past Month Active Spouse/Significant Other, Pharmacy Records  ibuprofen (ADVIL) 200 MG tablet [id number] No Take 200-400 mg by mouth every 6 (six) hours as needed for cramping or headache. [provider] Past Month Active Spouse/Significant Other, Pharmacy Records  levETIRAcetam  (KEPPRA  XR) 500 MG 24 hr tablet [id number] No Take 6 tablets (3,000 mg total) by mouth at bedtime. Masters, Katie, DO 04/11/2023 Active Spouse/Significant Other, Pharmacy Records  Multiple Vitamin (MULTIVITAMIN WITH MINERALS) TABS tablet [id number]  Take 1 tablet by mouth daily. Kandis Perkins, DO  Active   naltrexone  (DEPADE) 50 MG tablet [id number]  Take 1 tablet (50 mg total) by mouth daily. Kandis Perkins, DO  Active   PHENYTEK  300 MG ER capsule [id number]  Take 1 capsule (300 mg total) by mouth at bedtime. Kandis Perkins, DO  Active   risperiDONE  (RISPERDAL ) 1 MG tablet [id number]  Take 1 tablet (1 mg total) by mouth 2 (two) times daily. Kandis Perkins, DO  Active   thiamine  (VITAMIN B1) 100 MG tablet [id number]  Take 1 tablet (100 mg total) by mouth daily. Kandis Perkins, DO  Active   TYLENOL  500 MG tablet [id number] No Take 500-1,000 mg by mouth every 6 (six) hours as needed for mild pain (or headaches). [provider] Past Week Active Spouse/Significant Other, Pharmacy Records            Home Care and Equipment/Supplies: Were Home Health Services Ordered?: NA Any new equipment or medical supplies ordered?: NA  Functional Questionnaire: Do  you need assistance with bathing/showering or dressing?: No Do you need assistance with meal preparation?: No Do you need assistance with eating?: No Do you have difficulty maintaining continence: No Do you need assistance with getting out of bed/getting out of a chair/moving?: No Do you have difficulty managing or taking your  medications?: No  Follow up appointments reviewed: PCP Follow-up appointment confirmed?: Yes Date of PCP follow-up appointment?: 04/24/23 Follow-up Provider: [facility] Follow-up appointment confirmed?: NA Do you need transportation to your follow-up appointment?: No Do you understand care options if your condition(s) worsen?: Yes-patient verbalized understanding    SIGNATURE Julian Lemmings, LPN [facility] Nurse Health Advisor Direct Dial [phone]

## 2023-04-24 ENCOUNTER — Ambulatory Visit: Payer: Medicaid Other | Admitting: Student

## 2023-04-24 VITALS — BP 116/84 | HR 75 | Temp 98.1°F | Ht 62.0 in | Wt 107.7 lb

## 2023-04-24 DIAGNOSIS — F1090 Alcohol use, unspecified, uncomplicated: Secondary | ICD-10-CM

## 2023-04-24 DIAGNOSIS — F23 Brief psychotic disorder: Secondary | ICD-10-CM | POA: Diagnosis not present

## 2023-04-24 DIAGNOSIS — R569 Unspecified convulsions: Secondary | ICD-10-CM

## 2023-04-24 DIAGNOSIS — D539 Nutritional anemia, unspecified: Secondary | ICD-10-CM | POA: Diagnosis not present

## 2023-04-24 NOTE — Patient Instructions (Addendum)
 Thank you for allowing me to be a part of your care team. Today we discussed a few things:  1.Please call and schedule an appointment with Neurology as soon as possible regarding your recent admission. 2. You can discontinue your Risperidone  but please let us  know if you develop altered mental status 3. Follow-up in 3 months 4. Keep up the great work with the lifestyle changes you have been working on.

## 2023-04-24 NOTE — Progress Notes (Signed)
 CC: HFU  HPI:  [name] is a 43 y.o. female living with a history stated below and presents today for HFU. Please see problem based assessment and plan for additional details.  Past Medical History:  Diagnosis Date   Alcohol abuse    Hypokalemia 10/19/2022   SAH (subarachnoid hemorrhage) (HCC)    Seizure disorder (HCC)    TBI (traumatic brain injury) (HCC)    Transaminitis     Current Outpatient Medications on File Prior to Visit  Medication Sig Dispense Refill   Azelaic Acid  15 % gel Apply 1 Application topically 2 (two) times daily. After skin is thoroughly washed and patted dry, gently but thoroughly massage a thin film of azelaic acid  cream into the affected area twice daily, in the morning and evening. 50 g 0   folic acid  (FOLVITE ) 1 MG tablet Take 1 tablet (1 mg total) by mouth daily. 30 tablet 2   hydrOXYzine  (ATARAX ) 10 MG tablet Take 1 tablet (10 mg total) by mouth 3 (three) times daily as needed.     ibuprofen (ADVIL) 200 MG tablet Take 200-400 mg by mouth every 6 (six) hours as needed for cramping or headache.     levETIRAcetam  (KEPPRA  XR) 500 MG 24 hr tablet Take 6 tablets (3,000 mg total) by mouth at bedtime. 180 tablet 11   Multiple Vitamin (MULTIVITAMIN WITH MINERALS) TABS tablet Take 1 tablet by mouth daily. 30 tablet 3   naltrexone  (DEPADE) 50 MG tablet Take 1 tablet (50 mg total) by mouth daily. 30 tablet 3   PHENYTEK  300 MG ER capsule Take 1 capsule (300 mg total) by mouth at bedtime. 30 capsule 3   thiamine  (VITAMIN B1) 100 MG tablet Take 1 tablet (100 mg total) by mouth daily. 30 tablet 3   TYLENOL  500 MG tablet Take 500-1,000 mg by mouth every 6 (six) hours as needed for mild pain (or headaches).     No current facility-administered medications on file prior to visit.    Family History  Family history unknown: Yes    Social History   Socioeconomic History   Marital status: Single    Spouse name: Not on file   Number of children:  Not on file   Years of education: Not on file   Highest education level: Not on file  Occupational History   Not on file  Tobacco Use   Smoking status: Some Days    Current packs/day: 0.25    Types: Cigarettes    Passive exposure: Current   Smokeless tobacco: Never   Tobacco comments:    Quit smoking around 8 mths ago (feb 2024) , spouse still smokes and will smoke one of his from time to time  Vaping Use   Vaping status: Unknown  Substance and Sexual Activity   Alcohol use: Yes    Alcohol/week: 1.0 standard drink of alcohol    Types: 1 Glasses of wine per week    Comment: 1 drink per day   Drug use: Never   Sexual activity: Not on file  Other Topics Concern   Not on file  Social History Narrative   ** Merged History Encounter **       Social Drivers of Health   Financial Resource Strain: Medium Risk (11/21/2022)   Received from [facility]   Overall Financial Resource Strain (CARDIA)    Difficulty of Paying Living Expenses: Somewhat hard  Food Insecurity: Patient Unable To Answer (04/13/2023)   Hunger Vital Sign  Worried About Programme Researcher, Broadcasting/film/video in the Last Year: Patient unable to answer    Ran Out of Food in the Last Year: Patient unable to answer  Transportation Needs: Patient Unable To Answer (04/13/2023)   PRAPARE - Transportation    Lack of Transportation (Medical): Patient unable to answer    Lack of Transportation (Non-Medical): Patient unable to answer  Physical Activity: Not on file  Stress: Not on file  Social Connections: Unknown (08/27/2022)   [facility]   Social Network    Social Network: Not on file  Intimate Partner Violence: Patient Unable To Answer (04/13/2023)   Humiliation, Afraid, Rape, and Kick questionnaire    Fear of Current or Ex-Partner: Patient unable to answer    Emotionally Abused: Patient unable to answer    Physically Abused: Patient unable to answer    Sexually Abused: Patient unable to answer     Review of Systems: ROS negative except for what is noted on the assessment and plan.  Vitals:   04/24/23 1346  BP: 116/84  Pulse: 75  Temp: 98.1 F (36.7 C)  TempSrc: Oral  SpO2: 100%  Weight: 107 lb 11.2 oz (48.9 kg)  Height: 5' 2 (1.575 m)    Physical Exam: Constitutional: well-appearing, sitting in chair, in no acute distress Cardiovascular: regular rate and rhythm, no m/r/g, no LEE Pulmonary/Chest: normal work of breathing on room air, lungs clear to auscultation bilaterally MSK: normal bulk and tone Skin: warm and dry Psych: normal mood and behavior  Assessment & Plan:     Patient discussed with Dr. Trudy  Seizures [facility]) Patient admitted 12/27-1/2 for postictal AMS which was thought to be 2/2 to previous TBI.  Since discharge patient denies seizures and has been compliant with Keppra  3000 mg daily and phenytoin  300 mg daily.  Patient has previously scheduled neurology follow-up in June but will be calling to expedite that appointment based on recent admission.  Patient requested note in order to obtain seizure support dog. -Appreciate neurology recommendations  Acute psychosis [facility]) Patient seen by psychiatry during admission for acute psychosis in the setting of postictal altered mental status.  Started on risperidone  but, per psychiatry, the thought was that this was not going to be a long-term medication.  Since discharge patient and partner who is present during the office visit, state that patient has been in her normal state of mind and has not exhibited any of the inappropriate laughter that was shown during admission.  Patient alert and oriented and answered questions appropriately.  Denies SI/HI. For these reasons, and given the potential side effects of risperidone , we will discontinue this today.  Alcohol use disorder Denies alcohol use since discharge and has no current issues with cravings.  Discussed options if cravings or to return.  Patient started  on folate and thiamine  admission which was continued.  If patient continues to abstain from alcohol suspect this will not be needed long-term.  Patient is in the process of applying for disability for her seizure disorder and requested blood alcohol level in office today to provide to lawyer who is handling this.  Macrocytic anemia Hemoglobin ~ 10.5 and MCV ~103 during admission.  Upon chart review, this has been a pattern for years.  Patient denies fatigue, lightheadedness.  Folate/B12 wnl.  HIV negative.   Norman Lobstein, D.O. [facility] Internal Medicine, PGY-1 Phone: [phone] Date 04/25/2023 Time 8:37 AM

## 2023-04-25 LAB — ETHANOL

## 2023-04-25 NOTE — Assessment & Plan Note (Addendum)
 Denies alcohol use since discharge and has no current issues with cravings.  Discussed options if cravings or to return.  Patient started on folate and thiamine  admission which was continued.  If patient continues to abstain from alcohol suspect this will not be needed long-term.  Patient is in the process of applying for disability for her seizure disorder and requested blood alcohol level in office today to provide to lawyer who is handling this.

## 2023-04-25 NOTE — Assessment & Plan Note (Addendum)
 Patient admitted 12/27-1/2 for postictal AMS which was thought to be 2/2 to previous TBI.  Since discharge patient denies seizures and has been compliant with Keppra  3000 mg daily and phenytoin  300 mg daily.  Patient has previously scheduled neurology follow-up in June but will be calling to expedite that appointment based on recent admission.  Patient requested note in order to obtain seizure support dog. -Appreciate neurology recommendations

## 2023-04-25 NOTE — Assessment & Plan Note (Signed)
 Hemoglobin ~ 10.5 and MCV ~103 during admission.  Upon chart review, this has been a pattern for years.  Patient denies fatigue, lightheadedness.  Folate/B12 wnl.  HIV negative.

## 2023-04-25 NOTE — Assessment & Plan Note (Addendum)
 Patient seen by psychiatry during admission for acute psychosis in the setting of postictal altered mental status.  Started on risperidone  but, per psychiatry, the thought was that this was not going to be a long-term medication.  Since discharge patient and partner who is present during the office visit, state that patient has been in her normal state of mind and has not exhibited any of the inappropriate laughter that was shown during admission.  Patient alert and oriented and answered questions appropriately.  Denies SI/HI. For these reasons, and given the potential side effects of risperidone , we will discontinue this today.

## 2023-04-26 NOTE — Progress Notes (Signed)
 Internal Medicine Clinic Attending  Case discussed with the resident at the time of the visit.  We reviewed the resident's history and exam and pertinent patient test results.  I agree with the assessment, diagnosis, and plan of care documented in the resident's note.  In absence of vitamin deficiencies, HIV, and medications causing macrocytosis, alcohol associated macrocytosis is the suspected etiology of her elevated MCV.

## 2023-05-14 ENCOUNTER — Other Ambulatory Visit (HOSPITAL_COMMUNITY): Payer: Self-pay

## 2023-05-16 ENCOUNTER — Other Ambulatory Visit (HOSPITAL_COMMUNITY): Payer: Self-pay

## 2023-05-28 ENCOUNTER — Other Ambulatory Visit (HOSPITAL_COMMUNITY): Payer: Self-pay

## 2023-06-12 ENCOUNTER — Other Ambulatory Visit (HOSPITAL_COMMUNITY): Payer: Self-pay

## 2023-06-12 ENCOUNTER — Other Ambulatory Visit: Payer: Self-pay

## 2023-06-12 ENCOUNTER — Other Ambulatory Visit: Payer: Self-pay | Admitting: Internal Medicine

## 2023-06-12 DIAGNOSIS — G40909 Epilepsy, unspecified, not intractable, without status epilepticus: Secondary | ICD-10-CM

## 2023-06-12 MED ORDER — LEVETIRACETAM ER 500 MG PO TB24
3000.0000 mg | ORAL_TABLET | Freq: Every day | ORAL | 3 refills | Status: DC
  Filled 2023-06-12: qty 180, 30d supply, fill #0
  Filled 2023-07-15: qty 180, 30d supply, fill #1
  Filled 2023-08-14: qty 180, 30d supply, fill #2
  Filled 2023-09-16: qty 180, 30d supply, fill #3

## 2023-06-13 ENCOUNTER — Other Ambulatory Visit (HOSPITAL_COMMUNITY): Payer: Self-pay

## 2023-06-17 ENCOUNTER — Other Ambulatory Visit (HOSPITAL_COMMUNITY): Payer: Self-pay

## 2023-06-17 MED ORDER — PHENYTOIN SODIUM EXTENDED 300 MG PO CAPS
300.0000 mg | ORAL_CAPSULE | Freq: Every day | ORAL | 5 refills | Status: DC
  Filled 2023-06-17 – 2023-07-15 (×2): qty 90, 90d supply, fill #0
  Filled 2023-10-10 – 2023-10-14 (×2): qty 90, 90d supply, fill #1
  Filled 2024-01-13: qty 90, 90d supply, fill #2
  Filled ????-??-??: fill #0

## 2023-07-15 ENCOUNTER — Other Ambulatory Visit: Payer: Self-pay | Admitting: Internal Medicine

## 2023-07-15 ENCOUNTER — Other Ambulatory Visit (HOSPITAL_COMMUNITY): Payer: Self-pay

## 2023-07-15 DIAGNOSIS — D7589 Other specified diseases of blood and blood-forming organs: Secondary | ICD-10-CM

## 2023-07-15 MED ORDER — FOLIC ACID 1 MG PO TABS
1.0000 mg | ORAL_TABLET | Freq: Every day | ORAL | 2 refills | Status: DC
  Filled 2023-07-15: qty 30, 30d supply, fill #0
  Filled 2023-08-14: qty 30, 30d supply, fill #1
  Filled 2023-09-16: qty 30, 30d supply, fill #2

## 2023-07-15 NOTE — Telephone Encounter (Signed)
 Medication sent to pharmacy

## 2023-07-31 ENCOUNTER — Encounter (HOSPITAL_COMMUNITY): Payer: Self-pay | Admitting: Licensed Clinical Social Worker

## 2023-07-31 ENCOUNTER — Ambulatory Visit (INDEPENDENT_AMBULATORY_CARE_PROVIDER_SITE_OTHER): Payer: Medicaid Other | Admitting: Licensed Clinical Social Worker

## 2023-07-31 DIAGNOSIS — R569 Unspecified convulsions: Secondary | ICD-10-CM | POA: Diagnosis not present

## 2023-07-31 DIAGNOSIS — F064 Anxiety disorder due to known physiological condition: Secondary | ICD-10-CM | POA: Diagnosis not present

## 2023-07-31 NOTE — Progress Notes (Signed)
 Comprehensive Clinical Assessment (CCA) Note  07/31/2023 Alyssa Little [id number]  Chief Complaint:  Chief Complaint  Patient presents with   Anxiety   Visit Diagnosis: anxiety due to medical condition    Client is a  43 year old  female. Client is referred by  [facility] for a  depression and anxiety.   Client states mental health symptoms as evidenced by:   Depression Change in energy/activity; Difficulty Concentrating Change in energy/activity; Difficulty Concentrating  Duration of Depressive Symptoms Greater than two weeks Greater than two weeks  Mania None None  Anxiety Worrying; Tension; Difficulty concentrating Worrying; Tension; Difficulty concentrating  Psychosis None None  Trauma None None  Obsessions None None  Compulsions None None  Inattention None None  Hyperactivity/Impulsivity None None  Oppositional/Defiant Behaviors None None  Emotional Irregularity None None    Client denies suicidal and homicidal ideations at this time    Client denies hallucinations and delusions at this time   Client was screened for the following SDOH: Smoking, exercise, social interaction, and health literacy  Assessment Information that integrates subjective and objective details with a therapist's professional interpretation:    Alyssa Little was alert and oriented x 5.  She presented today with her partner for comprehensive clinical assessment.  Emnet was pleasant, cooperative, and maintained good eye contact.  She presented today with depressed and flat mood\affect.  Alyssa Little reports that she was a referral from [facility] discharge due to anxiety, and depression.  Patient reports that she was put on hydroxyzine and takes it as needed.  Alyssa Little reports occasional panic attacks.  She denies any suicidal or homicidal ideations.  Patient denies any auditory or visual hallucinations.  Patient reports today her primary support systems are her family.  Patient  reports anxiety when she is at home alone isolated.  Alyssa Little states that she is looking into getting a therapy dog or service animal to help with this.  She reports her primary stressors are her seizures that cause tension, worry, and restlessness.  Alyssa Little reports a history of alcohol abuse but has not used alcohol since December 2024.  Alyssa Little reports today that she would like to be seen by [facility] for continued medication management with hydroxyzine.   Client states use of the following substances: Alcohol last use dec 2024    Client was in agreement with treatment recommendations.      CCA Screening, Triage and Referral (STR)  Patient Reported Information Referral name: [name] Discharge   Whom do you see for routine medical problems? Primary Care  Practice/Facility Name: Rudene Christians, DO  Internal Medicine How Long Has This Been Causing You Problems? 1-6 months  What Do You Feel Would Help You the Most Today? Medication(s)   Have You Recently Been in Any Inpatient Treatment (Hospital/Detox/Crisis Center/28-Day Program)? No   Have You Ever Received Services From [employer] [facility] Before? Yes  Who Do You See at [facility]? Multiple services   Have You Recently Had Any Thoughts About Hurting Yourself? No  Are You Planning to Commit Suicide/Harm Yourself At This time? No   Have you Recently Had Thoughts About Hurting Someone Alyssa Little? No  Have You Used Any Alcohol or Drugs in the Past 24 Hours? No   Do You Currently Have a Therapist/Psychiatrist? No    Have You Been Recently Discharged From Any Office Practice or Programs? No     CCA Screening Triage Referral Assessment Type of Contact: Face-to-Face  Is CPS involved or ever been involved? Never  Is APS involved or ever been involved? Never   Patient Determined To Be At Risk for Harm To Self or Others Based on Review of Patient Reported Information  or Presenting Complaint? No  Method: No Plan  Availability of Means: No access or NA  Intent: Vague intent or NA  Notification Required: No need or identified person  Are There Guns or Other Weapons in Your Home? No  Location of Assessment: [facility] Assessment Services   [place] of Residence: [place]  Options For Referral: Medication Management   CCA Biopsychosocial Intake/Chief Complaint:  Pt reports anxiety being home alone and isolated when she hears loud noises  Current Symptoms/Problems: tension, worry,   Patient Reported Schizophrenia/Schizoaffective Diagnosis in Past: No   Strengths: willing to engage in services  Preferences: assessment   Type of Services Patient Feels are Needed: none reproted today   Initial Clinical Notes/Concerns: seizures   Mental Health Symptoms Depression:  Change in energy/activity; Difficulty Concentrating   Duration of Depressive symptoms: Greater than two weeks   Mania:  None   Anxiety:   Worrying; Tension; Difficulty concentrating   Psychosis:  None   Duration of Psychotic symptoms: No data recorded  Trauma:  None   Obsessions:  None   Compulsions:  None   Inattention:  None   Hyperactivity/Impulsivity:  None   Oppositional/Defiant Behaviors:  None   Emotional Irregularity:  None   Other Mood/Personality Symptoms:  No data recorded   Mental Status Exam Appearance and self-care  Stature:  Average   Weight:  Average weight   Clothing:  Casual   Grooming:  Normal   Cosmetic use:  None   Posture/gait:  Normal   Motor activity:  Not Remarkable   Sensorium  Attention:  Normal   Concentration:  Normal   Orientation:  X5   Recall/memory:  Normal   Affect and Mood  Affect:  Appropriate   Mood:  Depressed; Anxious   Relating  Eye contact:  None   Facial expression:  Depressed   Attitude toward examiner:  Cooperative   Thought and Language  Speech flow: Clear and Coherent   Thought  content:  Appropriate to Mood and Circumstances   Preoccupation:  None   Hallucinations:  None   Organization:  No data recorded  [employer] of Knowledge:  Fair   Intelligence:  Average   Abstraction:  Functional   Judgement:  Fair   Dance movement psychotherapist:  Adequate   Insight:  Fair   Decision Making:  Normal   Social Functioning  Social Maturity:  Isolates   Social Judgement:  Normal   Stress  Stressors:  Illness   Coping Ability:  Contractor Deficits:  None   Supports:  Family     Religion: Religion/Spirituality Are You A Religious Person?: No  Leisure/Recreation: Leisure / Recreation Do You Have Hobbies?: Yes Leisure and Hobbies: reading  Exercise/Diet: Exercise/Diet Do You Exercise?: Yes What Type of Exercise Do You Do?: Run/Walk How Many Times a Week Do You Exercise?: 1-3 times a week Have You Gained or Lost A Significant Amount of Weight in the Past Six Months?: No Do You Follow a Special Diet?: No Do You Have Any Trouble Sleeping?: No   CCA Employment/Education Employment/Work Situation: Employment / Work Situation Employment Situation: Unemployed Patient's Job has Been Impacted by Current Illness: Yes Describe how Patient's Job has Been Impacted: Seizures What is the Longest Time Patient  has Held a Job?: 22 years Where was the Patient Employed at that Time?: [employer] Has Patient ever Been in the [employer]?: No  Education: Education Is Patient Currently Attending School?: No Last Grade Completed: 12 Did Garment/textile technologist From [employer]?: Yes Did Theme park manager?: Yes What Type of College Degree Do you Have?: did not graduate Did You Attend Graduate School?: No Did You Have An Individualized Education Program (IIEP): No Did You Have Any Difficulty At School?: No Patient's Education Has Been Impacted by Current Illness: No   CCA Family/Childhood History Family and Relationship History: Family history Marital status:  Single Are you sexually active?: No What is your sexual orientation?: hetrsoexual Does patient have children?: No  Childhood History:  Childhood History By whom was/is the patient raised?: Mother Description of patient's relationship with caregiver when they were a child: Mother: Poor Patient's description of current relationship with people who raised him/her: Mother: None How were you disciplined when you got in trouble as a child/adolescent?: self discplined Does patient have siblings?: Yes Number of Siblings: 1 Description of patient's current relationship with siblings: does not talk too often to broteher Did patient suffer any verbal/emotional/physical/sexual abuse as a child?: No Did patient suffer from severe childhood neglect?: No Has patient ever been sexually abused/assaulted/raped as an adolescent or adult?: No Was the patient ever a victim of a crime or a disaster?: No Witnessed domestic violence?: No Has patient been affected by domestic violence as an adult?: No  Child/Adolescent Assessment:     CCA Substance Use Alcohol/Drug Use: Alcohol / Drug Use History of alcohol / drug use?: Yes Substance #1 Name of Substance 1: Alcohol 1 - Last Use / Amount: Dec 2024      DSM5 Diagnoses: Patient Active Problem List   Diagnosis Date Noted   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   Acute psychosis (HCC) 04/13/2023   Weight loss 10/19/2022   Elevated blood pressure reading 06/06/2022   Panic attack 11/09/2021   Tobacco use 11/09/2021   Healthcare maintenance 03/21/2021   Moderate protein-calorie malnutrition (HCC) 09/29/2018   Rosacea 09/29/2018   Macrocytic anemia 09/29/2018   TBI (traumatic brain injury) (HCC) 03/05/2018   Alcohol use disorder    Seizures (HCC)        Collaboration of Care: Other Referral to medication mgnt at [facility]   Patient/Guardian was advised Release of Information must be obtained prior to any record  release in order to collaborate their care with an outside provider. Patient/Guardian was advised if they have not already done so to contact the registration department to sign all necessary forms in order for us  to release information regarding their care.   Consent: Patient/Guardian gives verbal consent for treatment and assignment of benefits for services provided during this visit. Patient/Guardian expressed understanding and agreed to proceed.   Llesenia Fogal S Maddex Garlitz, LCSW

## 2023-08-01 ENCOUNTER — Ambulatory Visit (INDEPENDENT_AMBULATORY_CARE_PROVIDER_SITE_OTHER): Payer: Medicaid Other | Admitting: Student

## 2023-08-01 DIAGNOSIS — R569 Unspecified convulsions: Secondary | ICD-10-CM

## 2023-08-01 DIAGNOSIS — F1090 Alcohol use, unspecified, uncomplicated: Secondary | ICD-10-CM | POA: Diagnosis not present

## 2023-08-01 NOTE — Progress Notes (Signed)
 Psychiatric Initial Adult Assessment  Patient Identification: Alyssa Little MRN:  [id number] Date of Evaluation: 08/01/2023 Referral Source: [facility]  Assessment:  Alyssa Little is a 43 y.o. female with a history of seizure disorder and recent hospitalization for altered mental status who presents in person to [facility] for initial evaluation of psychiatric follow-up after hospitalization.  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  Since that time, the patient has had several other hospitalizations, the most recent of which occurred in December 2024 in which the patient presented with altered mental status of unknown etiology.  She experienced a seizure while in the hospital and appears to have experienced a brief psychotic episode for which psychiatry was consulted.  She was treated with risperidone.  Her episode of psychosis resolved.  I will also note that the patient has a history of what appears to be moderate alcohol use disorder, which may have some contribution to her seizures.  Since the hospitalization, the patient and her partner feel that she has been doing fairly well.  The patient has been abstinent from alcohol and has not had any seizures.  She reports minimal anxiety and no depression.  She reports taking care of her activities of daily living.  Her primary complaint is issues with memory.    Risk Assessment: A suicide and violence risk assessment was performed as part of this evaluation. The patient is deemed to be at chronic elevated risk for self-harm/suicide given the following factors: chronic severe medical condition. These risk factors are mitigated by the following factors: lack of active SI/HI, no known access to weapons  or firearms, no history of previous suicide attempts, no history of violence, motivation for treatment, utilization of positive coping skills, and supportive family, sense of responsibility to family and social supports. There is no acute risk for suicide or violence at this time. The patient was educated about relevant modifiable risk factors including following recommendations for treatment of psychiatric illness and abstaining from substance abuse.  While future psychiatric events cannot be accurately predicted, the patient does not currently require acute inpatient psychiatric care and does not currently meet [facility] involuntary commitment criteria.    Plan:  # Alcohol use disorder, in early remission Past medication trials:  Interventions: -- Patient took naltrexone briefly but experienced unspecified side effects, currently declines this medication and other MAT options - Recommended individual counseling with [facility], however, the patient declined - Follow-up with the patient in 2 months to check in on alcohol use and monitor for recurrence of significant psychiatric symptoms - Patient reports nonadherence to risperidone prescribed on discharge from her hospitalization.  It is fine for her to stay off this medication based on her apparent stability over the past several months. - Can continue hydroxyzine, rare as needed medication for this patient  # Seizure disorder  memory impairment Interventions: -- Recommend that the patient discuss her issues with neurology, referrals to OT or neuropsychology may be helpful - Patient is on high doses of 2 antiepileptic medications, question some contribution from these medications on her cognitive abilities - Continue Dilantin 300 mg daily - Continue Keppra 3000 mg daily  Patient was given contact information for behavioral health clinic and was instructed to call 911 for emergencies.    Subjective:  Chief Complaint:  Chief Complaint   Patient presents with   Establish Care    History of Present Illness:   Because this is my first time meeting the patient, relevant social history was collected.  Please see the social history section below.  The patient reports her primary concern is her memory issues.  She reports that these have developed into a serious issue since her hospitalization ended in early January.  She reports walking between rooms in the house and constantly forgetting what she is going to do.  She feels unsafe leaving the stove on or doing any cooking involving the stove in case she forgets about it.  She reports taking care of household chores with limitation.  She says that she has notebooks scattered throughout the house to help her memory.  The patient defers to her partner at certain points throughout the interview, such as when reviewing her medication list.  The patient reports a predominantly euthymic mood.  She denies experiencing significant anxiety.  She reports experiencing a panic attack approximately 1 time per month but feels she is able to deal with this through meditating.  She says that she wants to begin doing more yoga because she found this helpful previously.  The patient reports not having any suicidal thoughts.  She reports 10 hours of sleep per night and feels rested upon waking in the morning.  She reports a good level of food intake.  She reports prior issues with restrictive eating patterns "because my mom had anorexia".  She does not have significant body image disturbance or significant fear of gaining weight.  The patient screens negative for schizophrenia, bipolar disorder, PTSD.  Past Psychiatric History:  Patient denies prior suicide attempts or self-harm behavior.  Denies prior psychiatric hospitalization.  Congruent with chart review.  Denies access to firearms.  She reports a brief episode of therapy in her college years.  Denies any other mental health treatment.  Substance  Abuse History in the last 12 months:  Yes.    Past Medical History:  Past Medical History:  Diagnosis Date   Alcohol abuse    Hypokalemia 10/19/2022   SAH (subarachnoid hemorrhage) (HCC)    Seizure disorder (HCC)    TBI (traumatic brain injury) (HCC)    Transaminitis     Past Surgical History:  Procedure Laterality Date   CHOLECYSTECTOMY      Family Psychiatric History: Anorexia, none other reported  Family History:  Family History  Family history unknown: Yes    Social History:   The patient reports living in [place] with her partner of 20 years, [place].  She reports taking care of domestic duties while Cindee Lame works full-time at [employer].  She reports attending 4 years of college and graduating with a degree in technical theater.  She reports some family support in [place].  Patient denies illegal drug use.  Alcohol use previously consisted of approximately 3 beers a day (per her report).  Additional Social History: updated  Allergies:   Allergies  Allergen Reactions   Shellfish Allergy Anaphylaxis   Flagyl [Metronidazole] Hives   Penicillins Hives, Rash and Other (See Comments)    Tolerated cephalosporins including ancef  Has patient had a PCN reaction causing immediate rash, facial/tongue/throat swelling, SOB or lightheadedness with hypotension: Unknown Has patient had a PCN reaction causing severe rash involving mucus membranes or skin necrosis: Unknown Has patient had a PCN reaction that required hospitalization: Unknown Has patient had a PCN reaction occurring within the last 10 years:  Unknown If all of the above answers are "NO", then may    Current Medications: Current Outpatient Medications  Medication Sig Dispense Refill   folic acid (FOLVITE) 1 MG tablet Take 1 tablet (1 mg total) by mouth daily. 30 tablet 2   hydrOXYzine (ATARAX) 10 MG tablet Take 1 tablet (10 mg total) by mouth 3 (three) times daily as needed.     ibuprofen (ADVIL) 200 MG tablet Take  200-400 mg by mouth every 6 (six) hours as needed for cramping or headache.     levETIRAcetam (KEPPRA XR) 500 MG 24 hr tablet Take 6 tablets (3,000 mg total) by mouth at bedtime. 180 tablet 3   Multiple Vitamin (MULTIVITAMIN WITH MINERALS) TABS tablet Take 1 tablet by mouth daily. 30 tablet 3   PHENYTEK 300 MG ER capsule Take 1 capsule (300 mg total) by mouth at bedtime. 30 capsule 3   phenytoin (DILANTIN) 300 MG ER capsule Take 1 capsule (300 mg total) by mouth daily. 90 capsule 5   thiamine (VITAMIN B1) 100 MG tablet Take 1 tablet (100 mg total) by mouth daily. 30 tablet 3   TYLENOL 500 MG tablet Take 500-1,000 mg by mouth every 6 (six) hours as needed for mild pain (or headaches).     No current facility-administered medications for this visit.    Psychiatric Specialty Exam: Physical Exam Constitutional:      Appearance: the patient is not toxic-appearing.  Pulmonary:     Effort: Pulmonary effort is normal.  Neurological:     General: No focal deficit present.     Mental Status: the patient is alert and oriented to person, place, and time.   Review of Systems  Respiratory:  Negative for shortness of breath.   Cardiovascular:  Negative for chest pain.  Gastrointestinal:  Negative for abdominal pain, constipation, diarrhea, nausea and vomiting.  Neurological:  Negative for headaches.      There were no vitals taken for this visit.  General Appearance: Fairly Groomed  Eye Contact:  Good  Speech:  Clear and Coherent  Volume:  Normal  Mood:  Euthymic  Affect: Appears somewhat withdrawn  Thought Process:  Coherent  Orientation:  Full (Time, Place, and Person)  Thought Content: Logical   Suicidal Thoughts:  No  Homicidal Thoughts:  No  Memory:  Immediate;   Good  Judgement:  fair  Insight:  fair  Psychomotor Activity:  Normal  Concentration:  Concentration: Good  Recall:  Good  Fund of Knowledge: Good  Language: Good  Akathisia:  No  Handed:  not assessed  AIMS (if  indicated): not done  Assets:  Communication Skills Desire for Improvement Financial Resources/Insurance Housing Leisure Time Physical Health  ADL's:  Intact  Cognition: WNL  Sleep:  Fair      Metabolic Disorder Labs: No results found for: "HGBA1C", "MPG" No results found for: "PROLACTIN" Lab Results  Component Value Date   TRIG 158 (H) 03/02/2018   Lab Results  Component Value Date   TSH 4.448 04/13/2023    Therapeutic Level Labs: No results found for: "LITHIUM" No results found for: "CBMZ" No results found for: "VALPROATE"  Screenings:  CAGE-AID    Flowsheet Row ED to Hosp-Admission (Discharged) from 11/07/2020 in [facility]  CAGE-AID Score 3      GAD-7    Flowsheet Row Counselor from 07/31/2023 in [facility] Office Visit from 02/04/2023 in [facility] - A Dept Of [place]. [facility]  Total GAD-7 Score 5 8      PHQ2-9    Flowsheet Row Counselor from 07/31/2023 in [facility] Office Visit from 04/12/2023 in [facility] - A Dept Of [place]. [facility] Office Visit from 02/04/2023 in [facility] - A Dept Of [place]. [facility] Office Visit from 10/17/2022 in [facility] - A Dept Of [place]. [facility] Office Visit from 11/08/2021 in [facility] - A Dept Of [place]. [facility]  PHQ-2 Total Score 1 1 2  0 3  PHQ-9 Total Score -- 5 12 -- 11      Flowsheet Row Counselor from 07/31/2023 in [facility] ED to Hosp-Admission (Discharged) from 04/12/2023 in [facility] ED from 09/19/2022 in [facility] Emergency Department at [facility]  C-SSRS RISK CATEGORY Moderate Risk No Risk No Risk       Collaboration of Care: Collaboration of Care: Other none  Patient/Guardian was advised Release of  Information must be obtained prior to any record release in order to collaborate their care with an outside provider. Patient/Guardian was advised if they have not already done so to contact the registration department to sign all necessary forms in order for us  to release information regarding their care.   Consent: Patient/Guardian gives verbal consent for treatment and assignment of benefits for services provided during this visit. Patient/Guardian expressed understanding and agreed to proceed.   A total of 60 minutes was spent involved in face to face clinical care, chart review, documentation.  Marilou Showman, MD PGY-3

## 2023-08-02 NOTE — Addendum Note (Signed)
 Addended by: Ulysess Gang A on: 08/02/2023 01:58 PM   Modules accepted: Level of Service

## 2023-08-14 ENCOUNTER — Other Ambulatory Visit (HOSPITAL_COMMUNITY): Payer: Self-pay

## 2023-09-16 ENCOUNTER — Other Ambulatory Visit (HOSPITAL_COMMUNITY): Payer: Self-pay

## 2023-09-16 ENCOUNTER — Telehealth: Payer: Self-pay

## 2023-09-16 ENCOUNTER — Other Ambulatory Visit: Payer: Self-pay

## 2023-09-16 ENCOUNTER — Other Ambulatory Visit: Payer: Self-pay | Admitting: Student

## 2023-09-16 DIAGNOSIS — F1090 Alcohol use, unspecified, uncomplicated: Secondary | ICD-10-CM

## 2023-09-16 DIAGNOSIS — D539 Nutritional anemia, unspecified: Secondary | ICD-10-CM

## 2023-09-16 MED ORDER — THIAMINE HCL 100 MG PO TABS
100.0000 mg | ORAL_TABLET | Freq: Every day | ORAL | 3 refills | Status: AC
  Filled 2023-09-16: qty 90, 90d supply, fill #0
  Filled 2024-01-13: qty 90, 90d supply, fill #1
  Filled 2024-04-10: qty 90, 90d supply, fill #2

## 2023-09-16 NOTE — Telephone Encounter (Signed)
 Prior Authorization for patient (Thiamine  HCl 100MG  tablets) came through on cover my meds was submitted with last office notes awaiting approval or denial.  KEY: [id number]

## 2023-09-17 ENCOUNTER — Other Ambulatory Visit (HOSPITAL_COMMUNITY): Payer: Self-pay

## 2023-09-17 NOTE — Telephone Encounter (Signed)
 Keelie Zemanek (Key: [id number]) PA Case ID #: [id number] Rx #: [id number] Need Help? Call us  at [phone] Outcome Denied on June 2 by [employer] 2017 Denied. [place]  Medicaid does not cover the following service(s) in the [facility] Plan: [id number] THIAMINE  HCL 100MG  Tablet This drug's manufacturer does not participate in the Medicaid Drug Rebate Program and therefore the drug is not a covered pharmacy benefit. Drug Thiamine  HCl 100MG  tablets ePA cloud logo Form [employer] Electronic Prior Authorization Form Original Claim Info AC,70 Product Not Covered Non-Participating Manufacturer. Exclusion[PE]: Drug not Covered. - . Member redetermination reqd asof [id number].

## 2023-09-24 ENCOUNTER — Other Ambulatory Visit (HOSPITAL_COMMUNITY): Payer: Self-pay

## 2023-09-24 ENCOUNTER — Ambulatory Visit (INDEPENDENT_AMBULATORY_CARE_PROVIDER_SITE_OTHER): Admitting: Student

## 2023-09-24 DIAGNOSIS — R569 Unspecified convulsions: Secondary | ICD-10-CM

## 2023-09-24 DIAGNOSIS — F1090 Alcohol use, unspecified, uncomplicated: Secondary | ICD-10-CM | POA: Diagnosis not present

## 2023-09-24 DIAGNOSIS — F410 Panic disorder [episodic paroxysmal anxiety] without agoraphobia: Secondary | ICD-10-CM

## 2023-09-24 MED ORDER — PROPRANOLOL HCL 10 MG PO TABS
10.0000 mg | ORAL_TABLET | Freq: Two times a day (BID) | ORAL | 2 refills | Status: AC | PRN
  Filled 2023-09-24: qty 60, 30d supply, fill #0

## 2023-09-24 NOTE — Progress Notes (Unsigned)
 BH MD Outpatient Progress Note  Date of visit: 09/24/2023 Alyssa Little  MRN:  [id number]  Assessment:  Alyssa Little presents for follow-up evaluation. Patient reports sobriety from alcohol and no seizures.  She does report experiencing increased panic attacks since the death of a relative in the death of a close friend.  Depressive symptoms are limited.  Discussed starting propranolol as below.  The patient will follow-up with another resident psychiatrist on July 25 at 9 AM due to routine resident transition.  Identifying Information: Alyssa Little is a 43 year old female with a history of alcohol use disorder and postictal psychosis who is an established patient with [facility] for management of panic attacks.   Plan:  # Alcohol use disorder, in early remission Interventions: -- Patient declined MAT and counseling previously - Continue to assess sobriety  # Panic attacks Interventions: -- Grief is likely a significant component, therapy was recommended but the patient declined - Start propranolol 10 mg twice daily as needed - No history of asthma, I discussed the risk of orthostasis - The patient can continue to use hydroxyzine  should this be necessary   # Seizure disorder  Interventions: --Continue antiepileptics as prescribed by neurology - Continue Dilantin  300 mg daily - Continue Keppra  3000 mg daily  Patient was given contact information for behavioral health clinic and was instructed to call 911 for emergencies.   Subjective:  Chief Complaint:  Chief Complaint  Patient presents with   Follow-up    Interval History:  Today the patient says she has been doing fairly well.  Her partner, Alyssa Little accompanies her.  She reports staying away from alcohol by substituting other rituals such as drinking flavored drinks.  She is optimistic about staying away from alcohol going forward, and this appears to be her goal.  She  reports experiencing panic attacks a few times each week since the death of her family member and her close friend.  She reports prominent physical symptoms during these episodes.  She denies experiencing a depressed mood.  She reports that her sleep has been fairly good.  The patient denies having any thoughts of self-harm.  She reports a decent appetite but does admit that she has to remind herself to eat.  Her partner confirms that the patient has been doing relatively well.  She is currently looking for a job that she can do from home.  She does not feel she will be able to go to a workplace and do a job.  She reports significant improvement in her memory issues.    Visit Diagnosis:    ICD-10-CM   1. Panic attacks  F41.0 propranolol (INDERAL) 10 MG tablet    2. Alcohol use disorder  F10.90     3. Seizures (HCC)  R56.9       Past Psychiatric History:  Patient denies prior suicide attempts or self-harm behavior. Denies prior psychiatric hospitalization. Congruent with chart review. Denies access to firearms. She reports a brief episode of therapy in her college years. Denies any other mental health treatment.   Past Medical History:  Past Medical History:  Diagnosis Date   Alcohol abuse    Hypokalemia 10/19/2022   SAH (subarachnoid hemorrhage) (HCC)    Seizure disorder (HCC)    TBI (traumatic brain injury) (HCC)    Transaminitis     Past Surgical History:  Procedure Laterality Date   CHOLECYSTECTOMY      Family Psychiatric History: None pertinent  Family History:  Family  History  Family history unknown: Yes    Social History:  Social History   Socioeconomic History   Marital status: Single    Spouse name: Not on file   Number of children: Not on file   Years of education: Not on file   Highest education level: Not on file  Occupational History   Not on file  Tobacco Use   Smoking status: Some Days    Current packs/day: 0.25    Types: Cigarettes    Passive  exposure: Current   Smokeless tobacco: Never   Tobacco comments:    Quit smoking around 8 mths ago (feb 2024) , spouse still smokes and will smoke one of his from time to time  Vaping Use   Vaping status: Unknown  Substance and Sexual Activity   Alcohol use: Not Currently    Alcohol/week: 1.0 standard drink of alcohol    Types: 1 Glasses of wine per week    Comment: 1 drink per day   Drug use: Never   Sexual activity: Not Currently    Partners: Male  Other Topics Concern   Not on file  Social History Narrative   ** Merged History Encounter **       Social Drivers of Health   Financial Resource Strain: Low Risk  (05/15/2023)   Received from [facility]   Overall Financial Resource Strain (CARDIA)    Difficulty of Paying Living Expenses: Not very hard  Food Insecurity: No Food Insecurity (05/15/2023)   Received from [facility]   Hunger Vital Sign    Worried About Running Out of Food in the Last Year: Never true    Ran Out of Food in the Last Year: Never true  Transportation Needs: No Transportation Needs (05/15/2023)   [facility] from [facility] - Transportation    Lack of Transportation (Medical): No    Lack of Transportation (Non-Medical): No  Physical Activity: Insufficiently Active (07/31/2023)   Exercise Vital Sign    Days of Exercise per Week: 2 days    Minutes of Exercise per Session: 30 min  Stress: No Stress Concern Present (07/31/2023)   [employer] of Occupational Health - Occupational Stress Questionnaire    Feeling of Stress : Only a little  Social Connections: Moderately Isolated (07/31/2023)   Social Connection and Isolation Panel    Frequency of Communication with Friends and Family: Never    Frequency of Social Gatherings with Friends and Family: More than three times a week    Attends Religious Services: Never    Database administrator or Organizations: No    Attends Banker Meetings: Never    Marital Status: Living with  partner    Allergies:  Allergies  Allergen Reactions   Shellfish Allergy Anaphylaxis   Flagyl  [Metronidazole ] Hives   Penicillins Hives, Rash and Other (See Comments)    Tolerated cephalosporins including ancef   Has patient had a PCN reaction causing immediate rash, facial/tongue/throat swelling, SOB or lightheadedness with hypotension: Unknown Has patient had a PCN reaction causing severe rash involving mucus membranes or skin necrosis: Unknown Has patient had a PCN reaction that required hospitalization: Unknown Has patient had a PCN reaction occurring within the last 10 years: Unknown If all of the above answers are NO, then may    Current Medications: Current Outpatient Medications  Medication Sig Dispense Refill   propranolol (INDERAL) 10 MG tablet Take 1 tablet (10 mg total) by mouth 2 (two) times daily as needed.  60 tablet 2   folic acid  (FOLVITE ) 1 MG tablet Take 1 tablet (1 mg total) by mouth daily. 30 tablet 2   hydrOXYzine  (ATARAX ) 10 MG tablet Take 1 tablet (10 mg total) by mouth 3 (three) times daily as needed.     ibuprofen (ADVIL) 200 MG tablet Take 200-400 mg by mouth every 6 (six) hours as needed for cramping or headache.     levETIRAcetam  (KEPPRA  XR) 500 MG 24 hr tablet Take 6 tablets (3,000 mg total) by mouth at bedtime. 180 tablet 3   Multiple Vitamin (MULTIVITAMIN WITH MINERALS) TABS tablet Take 1 tablet by mouth daily. 30 tablet 3   PHENYTEK  300 MG ER capsule Take 1 capsule (300 mg total) by mouth at bedtime. 30 capsule 3   phenytoin  (DILANTIN ) 300 MG ER capsule Take 1 capsule (300 mg total) by mouth daily. 90 capsule 5   thiamine  (VITAMIN B1) 100 MG tablet Take 1 tablet (100 mg total) by mouth daily. 90 tablet 3   TYLENOL  500 MG tablet Take 500-1,000 mg by mouth every 6 (six) hours as needed for mild pain (or headaches).     No current facility-administered medications for this visit.     Objective:  Psychiatric Specialty Exam: Physical  Exam Constitutional:      Appearance: the patient is not toxic-appearing.  Pulmonary:     Effort: Pulmonary effort is normal.  Neurological:     General: No focal deficit present.     Mental Status: the patient is alert and oriented to person, place, and time.   Review of Systems  Respiratory:  Negative for shortness of breath.   Cardiovascular:  Negative for chest pain.  Gastrointestinal:  Negative for abdominal pain, constipation, diarrhea, nausea and vomiting.  Neurological:  Negative for headaches.      There were no vitals taken for this visit.  General Appearance: Fairly Groomed  Eye Contact:  Good  Speech:  Clear and Coherent  Volume:  Normal  Mood:  Euthymic  Affect:  Congruent  Thought Process:  Coherent  Orientation:  Full (Time, Place, and Person)  Thought Content: Logical   Suicidal Thoughts:  No  Homicidal Thoughts:  No  Memory:  Immediate;   Good  Judgement:  fair  Insight:  fair  Psychomotor Activity:  Normal  Concentration:  Concentration: Good  Recall:  Good  Fund of Knowledge: Good  Language: Good  Akathisia:  No  Handed:    AIMS (if indicated): not done  Assets:  Communication Skills Desire for Improvement Financial Resources/Insurance Housing Leisure Time Physical Health  ADL's:  Intact  Cognition: WNL       Metabolic Disorder Labs: No results found for: HGBA1C, MPG No results found for: PROLACTIN Lab Results  Component Value Date   TRIG 158 (H) 03/02/2018   Lab Results  Component Value Date   TSH 4.448 04/13/2023   TSH 0.519 03/26/2019    Therapeutic Level Labs: No results found for: LITHIUM No results found for: VALPROATE No results found for: CBMZ  Screenings: CAGE-AID    Flowsheet Row ED to Hosp-Admission (Discharged) from 11/07/2020 in [place] [facility]  CAGE-AID Score 3   GAD-7    Flowsheet Row Counselor from 07/31/2023 in [facility] Office Visit from 02/04/2023  in [facility] - A Dept Of [place]. [facility]  Total GAD-7 Score 5 8   PHQ2-9    Flowsheet Row Counselor from 07/31/2023 in [place]  [facility] Office Visit from 04/12/2023 in [facility] Internal Med Ctr - A Dept Of [place]. [facility] Office Visit from 02/04/2023 in [facility] Internal Med Ctr - A Dept Of [place]. [facility] Office Visit from 10/17/2022 in [facility] Internal Med Ctr - A Dept Of [place]. [facility] Office Visit from 11/08/2021 in [facility] Internal Med Ctr - A Dept Of [place]. [facility]  PHQ-2 Total Score 1 1 2  0 3  PHQ-9 Total Score -- 5 12 -- 11   Flowsheet Row Counselor from 07/31/2023 in [facility] ED to Hosp-Admission (Discharged) from 04/12/2023 in [place] [facility] [place] [facility] ED from 09/19/2022 in [facility] Emergency Department at [facility]  C-SSRS RISK CATEGORY Moderate Risk No Risk No Risk    Collaboration of Care: none  A total of 30 minutes was spent involved in face to face clinical care, chart review, documentation.   Marilou Showman, MD 09/26/2023, 8:08 AM

## 2023-10-01 ENCOUNTER — Encounter: Payer: Self-pay | Admitting: *Deleted

## 2023-10-10 ENCOUNTER — Other Ambulatory Visit: Payer: Self-pay | Admitting: Internal Medicine

## 2023-10-10 ENCOUNTER — Other Ambulatory Visit (HOSPITAL_COMMUNITY): Payer: Self-pay

## 2023-10-10 ENCOUNTER — Other Ambulatory Visit: Payer: Self-pay

## 2023-10-10 DIAGNOSIS — G40909 Epilepsy, unspecified, not intractable, without status epilepticus: Secondary | ICD-10-CM

## 2023-10-10 MED ORDER — LEVETIRACETAM ER 500 MG PO TB24
3000.0000 mg | ORAL_TABLET | Freq: Every day | ORAL | 3 refills | Status: DC
  Filled 2023-10-10 – 2023-10-14 (×2): qty 180, 30d supply, fill #0
  Filled 2023-11-14: qty 180, 30d supply, fill #1
  Filled 2023-12-12: qty 180, 30d supply, fill #2
  Filled 2024-01-13: qty 180, 30d supply, fill #3

## 2023-10-10 NOTE — Telephone Encounter (Signed)
 Medication sent to pharmacy

## 2023-10-11 ENCOUNTER — Other Ambulatory Visit (HOSPITAL_COMMUNITY): Payer: Self-pay

## 2023-10-14 ENCOUNTER — Other Ambulatory Visit: Payer: Self-pay | Admitting: Internal Medicine

## 2023-10-14 ENCOUNTER — Other Ambulatory Visit (HOSPITAL_COMMUNITY): Payer: Self-pay

## 2023-10-14 ENCOUNTER — Other Ambulatory Visit: Payer: Self-pay

## 2023-10-14 DIAGNOSIS — D7589 Other specified diseases of blood and blood-forming organs: Secondary | ICD-10-CM

## 2023-10-14 MED ORDER — FOLIC ACID 1 MG PO TABS
1.0000 mg | ORAL_TABLET | Freq: Every day | ORAL | 2 refills | Status: DC
  Filled 2023-10-14: qty 30, 30d supply, fill #0
  Filled 2023-11-14: qty 30, 30d supply, fill #1
  Filled 2024-01-13: qty 30, 30d supply, fill #2

## 2023-10-15 ENCOUNTER — Other Ambulatory Visit (HOSPITAL_COMMUNITY): Payer: Self-pay

## 2023-11-04 NOTE — Progress Notes (Unsigned)
 BH MD Outpatient Progress Note  Date of visit: 09/24/2023 Alyssa Little  MRN:  [id number]  Assessment:  Alyssa Little presents for follow-up evaluation. Patient reports sobriety from alcohol and no seizures.  She does report experiencing increased panic attacks since the death of a relative in the death of a close friend.  Depressive symptoms are limited.  Discussed starting propranolol  as below.    ***  Identifying Information: Alyssa Little is a 43 year old female with a history of alcohol use disorder and postictal psychosis who is an established patient with [facility] for management of panic attacks.   Plan:  # Alcohol use disorder, in early remission Interventions: -- Patient declined MAT and counseling previously - Continue to assess sobriety  # Panic attacks Interventions: -- Grief is likely a significant component, therapy was recommended but the patient declined - Propranolol  10 mg twice daily as needed - No history of asthma, I discussed the risk of orthostasis - The patient can continue to use hydroxyzine  should this be necessary   # Seizure disorder  Interventions: --Continue antiepileptics as prescribed by neurology - Continue Dilantin  300 mg daily - Continue Keppra  3000 mg daily  Patient was given contact information for behavioral health clinic and was instructed to call 911 for emergencies.   Subjective:  Chief Complaint:  No chief complaint on file.   Interval History:  Today the patient says she has been doing fairly well.  Her partner, Jeralyn accompanies her.  She reports staying away from alcohol by substituting other rituals such as drinking flavored drinks.  She is optimistic about staying away from alcohol going forward, and this appears to be her goal.  She reports experiencing panic attacks a few times each week since the death of her family member and her close friend.  She reports prominent  physical symptoms during these episodes.  She denies experiencing a depressed mood.  She reports that her sleep has been fairly good.  The patient denies having any thoughts of self-harm.  She reports a decent appetite but does admit that she has to remind herself to eat.  Her partner confirms that the patient has been doing relatively well.  She is currently looking for a job that she can do from home.  She does not feel she will be able to go to a workplace and do a job.  She reports significant improvement in her memory issues.    ***  Visit Diagnosis:  No diagnosis found.   Past Psychiatric History:  Patient denies prior suicide attempts or self-harm behavior. Denies prior psychiatric hospitalization. Congruent with chart review. Denies access to firearms. She reports a brief episode of therapy in her college years. Denies any other mental health treatment.   Past Medical History:  Past Medical History:  Diagnosis Date   Alcohol abuse    Hypokalemia 10/19/2022   SAH (subarachnoid hemorrhage) (HCC)    Seizure disorder (HCC)    TBI (traumatic brain injury) (HCC)    Transaminitis     Past Surgical History:  Procedure Laterality Date   CHOLECYSTECTOMY      Family Psychiatric History: None pertinent  Family History:  Family History  Family history unknown: Yes    Social History:  Social History   Socioeconomic History   Marital status: Single    Spouse name: Not on file   Number of children: Not on file   Years of education: Not on file   Highest education level: Not on  file  Occupational History   Not on file  Tobacco Use   Smoking status: Some Days    Current packs/day: 0.25    Types: Cigarettes    Passive exposure: Current   Smokeless tobacco: Never   Tobacco comments:    Quit smoking around 8 mths ago (feb 2024) , spouse still smokes and will smoke one of his from time to time  Vaping Use   Vaping status: Unknown  Substance and Sexual Activity   Alcohol use:  Not Currently    Alcohol/week: 1.0 standard drink of alcohol    Types: 1 Glasses of wine per week    Comment: 1 drink per day   Drug use: Never   Sexual activity: Not Currently    Partners: Male  Other Topics Concern   Not on file  Social History Narrative   ** Merged History Encounter **       Social Drivers of Health   Financial Resource Strain: Low Risk  (05/15/2023)   Received from [facility]   Overall Financial Resource Strain (CARDIA)    Difficulty of Paying Living Expenses: Not very hard  Food Insecurity: No Food Insecurity (05/15/2023)   [facility] from [facility]   Hunger Vital Sign    Within the past 12 months, you worried that your food would run out before you got the money to buy more.: Never true    Within the past 12 months, the food you bought just didn't last and you didn't have money to get more.: Never true  Transportation Needs: No Transportation Needs (05/15/2023)   Received from [facility] - Transportation    Lack of Transportation (Medical): No    Lack of Transportation (Non-Medical): No  Physical Activity: Insufficiently Active (07/31/2023)   Exercise Vital Sign    Days of Exercise per Week: 2 days    Minutes of Exercise per Session: 30 min  Stress: No Stress Concern Present (07/31/2023)   [employer] of Occupational Health - Occupational Stress Questionnaire    Feeling of Stress : Only a little  Social Connections: Moderately Isolated (07/31/2023)   Social Connection and Isolation Panel    Frequency of Communication with Friends and Family: Never    Frequency of Social Gatherings with Friends and Family: More than three times a week    Attends Religious Services: Never    Database administrator or Organizations: No    Attends Banker Meetings: Never    Marital Status: Living with partner    Allergies:  Allergies  Allergen Reactions   Shellfish Allergy Anaphylaxis   Flagyl  [Metronidazole ] Hives   Penicillins  Hives, Rash and Other (See Comments)    Tolerated cephalosporins including ancef   Has patient had a PCN reaction causing immediate rash, facial/tongue/throat swelling, SOB or lightheadedness with hypotension: Unknown Has patient had a PCN reaction causing severe rash involving mucus membranes or skin necrosis: Unknown Has patient had a PCN reaction that required hospitalization: Unknown Has patient had a PCN reaction occurring within the last 10 years: Unknown If all of the above answers are NO, then may    Current Medications: Current Outpatient Medications  Medication Sig Dispense Refill   folic acid  (FOLVITE ) 1 MG tablet Take 1 tablet (1 mg total) by mouth daily. 30 tablet 2   hydrOXYzine  (ATARAX ) 10 MG tablet Take 1 tablet (10 mg total) by mouth 3 (three) times daily as needed.     ibuprofen (ADVIL) 200 MG tablet Take 200-400  mg by mouth every 6 (six) hours as needed for cramping or headache.     levETIRAcetam  (KEPPRA  XR) 500 MG 24 hr tablet Take 6 tablets (3,000 mg total) by mouth at bedtime. 180 tablet 3   Multiple Vitamin (MULTIVITAMIN WITH MINERALS) TABS tablet Take 1 tablet by mouth daily. 30 tablet 3   PHENYTEK  300 MG ER capsule Take 1 capsule (300 mg total) by mouth at bedtime. 30 capsule 3   phenytoin  (DILANTIN ) 300 MG ER capsule Take 1 capsule (300 mg total) by mouth daily. 90 capsule 5   propranolol  (INDERAL ) 10 MG tablet Take 1 tablet (10 mg total) by mouth 2 (two) times daily as needed. 60 tablet 2   thiamine  (VITAMIN B1) 100 MG tablet Take 1 tablet (100 mg total) by mouth daily. 90 tablet 3   TYLENOL  500 MG tablet Take 500-1,000 mg by mouth every 6 (six) hours as needed for mild pain (or headaches).     No current facility-administered medications for this visit.     Objective: Psychiatric Specialty Exam: General Appearance: appears at stated age, casually dressed and groomed ***  Behavior: pleasant and cooperative ***  Psychomotor Activity: no psychomotor  agitation or retardation noted ***  Eye Contact: fair *** Speech: normal amount, volume and fluency ***   Mood: euthymic *** Affect: congruent, pleasant and interactive ***  Thought Process: linear, goal directed, no circumstantial or tangential thought process noted, no racing thoughts or flight of ideas *** Descriptions of Associations: intact ***  Thought Content Hallucinations: denies AH, VH , does not appear responding to stimuli *** Delusions: no paranoia, delusions of control, grandeur, ideas of reference, thought broadcasting, and magical thinking *** Suicidal Thoughts: denies SI, intention, plan *** Homicidal Thoughts: denies HI, intention, plan ***  Alertness/Orientation: alert and fully oriented ***  Insight: fair*** Judgment: fair***  Memory: intact ***  Executive Functions  Concentration: intact *** Attention Span: fair *** Recall: intact *** Fund of Knowledge: fair ***  Physical Exam *** General: Pleasant, well-appearing ***. No acute distress. Pulmonary: Normal effort. No wheezing or rales. Skin: No obvious rash or lesions. Neuro: A&Ox3.No focal deficit.  Review of Systems *** No reported symptoms   Metabolic Disorder Labs: No results found for: HGBA1C, MPG No results found for: PROLACTIN Lab Results  Component Value Date   TRIG 158 (H) 03/02/2018   Lab Results  Component Value Date   TSH 4.448 04/13/2023   TSH 0.519 03/26/2019    Therapeutic Level Labs: No results found for: LITHIUM No results found for: VALPROATE No results found for: CBMZ  Screenings: CAGE-AID    Flowsheet Row ED to Hosp-Admission (Discharged) from 11/07/2020 in [place] [facility]  CAGE-AID Score 3   GAD-7    Flowsheet Row Counselor from 07/31/2023 in [facility] Office Visit from 02/04/2023 in [facility] Internal Med Ctr - A Dept Of [place]. [facility]  Total GAD-7 Score 5 8   PHQ2-9    Flowsheet  Row Counselor from 07/31/2023 in [facility] Office Visit from 04/12/2023 in [facility] Internal Med Ctr - A Dept Of [place]. [facility] Office Visit from 02/04/2023 in [facility] Internal Med Ctr - A Dept Of [place]. [facility] Office Visit from 10/17/2022 in [facility] Internal Med Ctr - A Dept Of [place]. [facility] Office Visit from 11/08/2021 in [facility] Internal Med Ctr - A Dept Of . [facility]  Hospital  PHQ-2 Total Score 1 1 2  0 3  PHQ-9 Total Score -- 5 12 -- 11   Flowsheet Row Counselor from 07/31/2023 in [facility] ED to Hosp-Admission (Discharged) from 04/12/2023 in [place] [facility] [place] [facility] ED from 09/19/2022 in [facility] Emergency Department at [facility]  C-SSRS RISK CATEGORY Moderate Risk No Risk No Risk    Ismael Franco, MD PGY-3 Psychiatry Resident

## 2023-11-08 ENCOUNTER — Ambulatory Visit (INDEPENDENT_AMBULATORY_CARE_PROVIDER_SITE_OTHER): Admitting: Psychiatry

## 2023-11-08 VITALS — BP 108/81 | Ht 62.0 in | Wt 99.8 lb

## 2023-11-08 DIAGNOSIS — F410 Panic disorder [episodic paroxysmal anxiety] without agoraphobia: Secondary | ICD-10-CM | POA: Diagnosis not present

## 2023-11-08 DIAGNOSIS — F1090 Alcohol use, unspecified, uncomplicated: Secondary | ICD-10-CM | POA: Diagnosis not present

## 2023-11-14 ENCOUNTER — Other Ambulatory Visit: Payer: Self-pay

## 2023-11-14 ENCOUNTER — Other Ambulatory Visit (HOSPITAL_COMMUNITY): Payer: Self-pay

## 2023-12-12 ENCOUNTER — Other Ambulatory Visit: Payer: Self-pay

## 2023-12-12 ENCOUNTER — Other Ambulatory Visit (HOSPITAL_COMMUNITY): Payer: Self-pay

## 2023-12-19 NOTE — Progress Notes (Signed)
 BH MD Outpatient Progress Note  Date of visit: 09/24/2023 Alyssa Little  MRN:  [id number]  Assessment:  Alyssa Little presents for follow-up evaluation. Patient is doing well regarding symptoms of anxiety. Her weight is below her goal and she states having low appetite. Patient would benefit from starting Remeron but at this time she is wanting to proceed with behavioral interventions like increasing meal frequency with higher calorie foods prior to starting a medication. Patient also tries to use her coping strategies for her anxiety instead of using the as needed anxiolytic medications to which I discussed that it is appropriate to take her PRN anxiety medications if needed. Reassuringly, she plans on getting a dog which I feel can aid with her being more active. No medication changes today, f/u in 3 months.  Plan:  # Appetite loss - Nutriton referral placed   # Panic attacks - Continue Propranolol  10 mg twice daily as needed - Continue Atarax  25 mg TID PRN  # Alcohol use disorder, in early remission Most recent was December 2024 - Encourage maintenance of cessation  # Seizure disorder  --Continue antiepileptics as prescribed by neurology - Continue Dilantin  300 mg daily - Continue Keppra  3000 mg daily  Patient was given contact information for behavioral health clinic and was instructed to call 911 for emergencies.   Identifying Information: Alyssa Little is a 43 year old female with a history of alcohol use disorder and postictal psychosis who is an established patient with [facility] for management of panic attacks.   Subjective:  Chief Complaint:  Chief Complaint  Patient presents with   Follow-up    Interval History:   Patient seen with her husband.  Patient reports feeling okay today. Since the previous visit, she reports mainly being in house watching TV and cleaning the house. She is looking into getting the  dog. Regarding medications, patient notes benefit regarding anxiety when she takes the medication. She states also using meditation as her coping skill. She notes only taking with the propranolol  and atarax . Patient reports the following adverse effects: denies. I offered starting Remeron but she states that she wants to use behavioral modification first. She plans to incorporate more high calorie meals like soups  Patient reports good sleep, sleeping from 2-12. Patient reports fair appetite, reporting eating 1-2 meals daily.   Patient denies current SI, HI, and AVH.   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 Denies prior psychiatric hospitalization. Congruent with chart review. Denies access to firearms. She reports a brief episode of therapy in her college years. Denies any other mental health treatment.   Family Psychiatric History: None pertinent  Social History:  Living: lives with partner in [place] Occupation: unemployed Children: denies Support: husband  Past Medical History:  Past Medical History:  Diagnosis Date   Alcohol abuse    Hypokalemia 10/19/2022   SAH (subarachnoid hemorrhage) (HCC)    Seizure disorder (HCC)    TBI (traumatic brain injury) (HCC)    Transaminitis     Past Surgical History:  Procedure Laterality Date   CHOLECYSTECTOMY      Family History:  Family History  Family history unknown: Yes    Social History   Socioeconomic History  Marital status: Single    Spouse name: Not on file   Number of children: Not on file   Years of education: Not on file   Highest education level: Not on file  Occupational History   Not on file  Tobacco Use   Smoking status: Some Days    Current packs/day: 0.25    Types:  Cigarettes    Passive exposure: Current   Smokeless tobacco: Never   Tobacco comments:    Quit smoking around 8 mths ago (feb 2024) , spouse still smokes and will smoke one of his from time to time  Vaping Use   Vaping status: Unknown  Substance and Sexual Activity   Alcohol use: Not Currently    Alcohol/week: 1.0 standard drink of alcohol    Types: 1 Glasses of wine per week    Comment: 1 drink per day   Drug use: Never   Sexual activity: Not Currently    Partners: Male  Other Topics Concern   Not on file  Social History Narrative   ** Merged History Encounter **       Social Drivers of Health   Financial Resource Strain: Low Risk  (05/15/2023)   Received from [facility]   Overall Financial Resource Strain (CARDIA)    Difficulty of Paying Living Expenses: Not very hard  Food Insecurity: No Food Insecurity (05/15/2023)   Received from [facility]   Hunger Vital Sign    Within the past 12 months, you worried that your food would run out before you got the money to buy more.: Never true    Within the past 12 months, the food you bought just didn't last and you didn't have money to get more.: Never true  Transportation Needs: No Transportation Needs (05/15/2023)   Received from [facility] - Transportation    Lack of Transportation (Medical): No    Lack of Transportation (Non-Medical): No  Physical Activity: Insufficiently Active (07/31/2023)   Exercise Vital Sign    Days of Exercise per Week: 2 days    Minutes of Exercise per Session: 30 min  Stress: No Stress Concern Present (07/31/2023)   [employer] of Occupational Health - Occupational Stress Questionnaire    Feeling of Stress : Only a little  Social Connections: Moderately Isolated (07/31/2023)   Social Connection and Isolation Panel    Frequency of Communication with Friends and Family: Never    Frequency of Social Gatherings with Friends and Family: More than three times a week    Attends  Religious Services: Never    Database administrator or Organizations: No    Attends Banker Meetings: Never    Marital Status: Living with partner    Allergies:  Allergies  Allergen Reactions   Shellfish Allergy Anaphylaxis   Flagyl  [Metronidazole ] Hives   Penicillins Hives, Rash and Other (See Comments)    Tolerated cephalosporins including ancef   Has patient had a PCN reaction causing immediate rash, facial/tongue/throat swelling, SOB or lightheadedness with hypotension: Unknown Has patient had a PCN reaction causing severe rash involving mucus membranes or skin necrosis: Unknown Has patient had a PCN reaction that required hospitalization: Unknown Has patient had a PCN reaction occurring within the last 10 years: Unknown If all of the above answers are NO, then may    Current Medications: Current Outpatient Medications  Medication Sig Dispense Refill   folic acid  (FOLVITE ) 1 MG tablet Take 1 tablet (1 mg total) by mouth daily. 30  tablet 2   hydrOXYzine  (ATARAX ) 10 MG tablet Take 1 tablet (10 mg total) by mouth 3 (three) times daily as needed.     ibuprofen (ADVIL) 200 MG tablet Take 200-400 mg by mouth every 6 (six) hours as needed for cramping or headache.     levETIRAcetam  (KEPPRA  XR) 500 MG 24 hr tablet Take 6 tablets (3,000 mg total) by mouth at bedtime. 180 tablet 3   Multiple Vitamin (MULTIVITAMIN WITH MINERALS) TABS tablet Take 1 tablet by mouth daily. 30 tablet 3   PHENYTEK  300 MG ER capsule Take 1 capsule (300 mg total) by mouth at bedtime. 30 capsule 3   phenytoin  (DILANTIN ) 300 MG ER capsule Take 1 capsule (300 mg total) by mouth daily. 90 capsule 5   propranolol  (INDERAL ) 10 MG tablet Take 1 tablet (10 mg total) by mouth 2 (two) times daily as needed. 60 tablet 2   thiamine  (VITAMIN B1) 100 MG tablet Take 1 tablet (100 mg total) by mouth daily. 90 tablet 3   TYLENOL  500 MG tablet Take 500-1,000 mg by mouth every 6 (six) hours as needed for mild pain (or  headaches).     No current facility-administered medications for this visit.     Objective:  Psychiatric Specialty Exam: General Appearance: appears at stated age, casually dressed and groomed   Behavior: pleasant and cooperative   Psychomotor Activity: no psychomotor agitation or retardation noted   Eye Contact: fair  Speech: normal amount, volume and fluency    Mood: euthymic  Affect: congruent, pleasant and interactive   Thought Process: linear, goal directed, no circumstantial or tangential thought process noted, no racing thoughts or flight of ideas  Descriptions of Associations: intact   Thought Content Hallucinations: denies AH, VH , does not appear responding to stimuli  Delusions: no paranoia, delusions of control, grandeur, ideas of reference, thought broadcasting, and magical thinking  Suicidal Thoughts: denies SI, intention, plan  Homicidal Thoughts: denies HI, intention, plan   Alertness/Orientation: alert and fully oriented   Insight: fair Judgment: fair  Memory: intact   Executive Functions  Concentration: intact  Attention Span: fair  Recall: intact  Fund of Knowledge: fair   Physical Exam  General: Pleasant, well-appearing. No acute distress. Pulmonary: Normal effort. No wheezing or rales. Neuro: A&Ox3.No focal deficit.  Review of Systems  No reported symptoms   Metabolic Disorder Labs: No results found for: HGBA1C, MPG No results found for: PROLACTIN Lab Results  Component Value Date   TRIG 158 (H) 03/02/2018   Lab Results  Component Value Date   TSH 4.448 04/13/2023   TSH 0.519 03/26/2019    Therapeutic Level Labs: No results found for: LITHIUM No results found for: VALPROATE No results found for: CBMZ  Screenings: CAGE-AID    Flowsheet Row ED to Hosp-Admission (Discharged) from 11/07/2020 in [facility]  CAGE-AID Score 3   GAD-7    Flowsheet Row Counselor from 07/31/2023 in [facility] Office Visit from 02/04/2023 in [facility] - A Dept Of [place]. [facility]  Total GAD-7 Score 5 8   PHQ2-9    Flowsheet Row Counselor from 07/31/2023 in [facility] Office Visit from 04/12/2023 in [facility] - A Dept Of [place]. [facility] Office Visit from 02/04/2023 in [facility] Internal Med Ctr - A Dept Of [place]. [facility] Office Visit from 10/17/2022 in [facility] Internal Med  Ctr - A Dept Of [place]. [facility] Office Visit from 11/08/2021 in [facility] Internal Med Ctr - A Dept Of [place]. [facility]  PHQ-2 Total Score 1 1 2  0 3  PHQ-9 Total Score -- 5 12 -- 11   Flowsheet Row Counselor from 07/31/2023 in [facility] ED to Hosp-Admission (Discharged) from 04/12/2023 in [facility] ED from 09/19/2022 in [facility] Emergency Department at [facility]  C-SSRS RISK CATEGORY Moderate Risk No Risk No Risk    Ismael Franco, MD PGY-3 Psychiatry Resident

## 2023-12-25 ENCOUNTER — Telehealth: Payer: Self-pay | Admitting: *Deleted

## 2023-12-25 NOTE — Telephone Encounter (Signed)
 Copied from CRM [id number]. Topic: Appointments - Appointment Info/Confirmation >> Dec 25, 2023  4:14 PM Miquel SAILOR wrote: Patient/patient representative is calling for information regarding an appointment.   Patient son Medford Checking for app for this month none we have

## 2023-12-25 NOTE — Telephone Encounter (Signed)
 Return call to Alyssa Little,[place] - he was informed pt has no appt scheduled at our office; he stated it must be w/BH.

## 2023-12-27 ENCOUNTER — Ambulatory Visit (INDEPENDENT_AMBULATORY_CARE_PROVIDER_SITE_OTHER): Admitting: Psychiatry

## 2023-12-27 VITALS — BP 105/70 | Wt 98.6 lb

## 2023-12-27 DIAGNOSIS — R630 Anorexia: Secondary | ICD-10-CM

## 2023-12-27 DIAGNOSIS — F064 Anxiety disorder due to known physiological condition: Secondary | ICD-10-CM

## 2024-01-13 ENCOUNTER — Other Ambulatory Visit (HOSPITAL_COMMUNITY): Payer: Self-pay

## 2024-01-13 ENCOUNTER — Other Ambulatory Visit: Payer: Self-pay

## 2024-01-14 ENCOUNTER — Other Ambulatory Visit (HOSPITAL_BASED_OUTPATIENT_CLINIC_OR_DEPARTMENT_OTHER): Payer: Self-pay

## 2024-01-14 ENCOUNTER — Other Ambulatory Visit (HOSPITAL_COMMUNITY): Payer: Self-pay

## 2024-01-14 MED ORDER — PHENYTEK 300 MG PO CAPS
300.0000 mg | ORAL_CAPSULE | Freq: Every day | ORAL | 5 refills | Status: DC
  Filled 2024-01-14 – 2024-01-15 (×2): qty 90, 90d supply, fill #0

## 2024-01-15 ENCOUNTER — Other Ambulatory Visit (HOSPITAL_COMMUNITY): Payer: Self-pay

## 2024-01-15 MED ORDER — PHENYTEK 300 MG PO CAPS
300.0000 mg | ORAL_CAPSULE | Freq: Every day | ORAL | 5 refills | Status: AC
  Filled 2024-01-15: qty 90, 90d supply, fill #0
  Filled 2024-04-10: qty 90, 90d supply, fill #1

## 2024-01-16 ENCOUNTER — Other Ambulatory Visit (HOSPITAL_COMMUNITY): Payer: Self-pay

## 2024-01-20 ENCOUNTER — Inpatient Hospital Stay (HOSPITAL_COMMUNITY)

## 2024-01-20 ENCOUNTER — Emergency Department (HOSPITAL_COMMUNITY)

## 2024-01-20 ENCOUNTER — Ambulatory Visit: Payer: Self-pay

## 2024-01-20 ENCOUNTER — Encounter (HOSPITAL_COMMUNITY): Payer: Self-pay

## 2024-01-20 ENCOUNTER — Other Ambulatory Visit: Payer: Self-pay

## 2024-01-20 ENCOUNTER — Inpatient Hospital Stay (HOSPITAL_COMMUNITY)
Admission: EM | Admit: 2024-01-20 | Discharge: 2024-01-22 | DRG: 057 | Disposition: A | Discharge status: Home / Self Care | Attending: Internal Medicine | Admitting: Internal Medicine

## 2024-01-20 DIAGNOSIS — I69120 Aphasia following nontraumatic intracerebral hemorrhage: Secondary | ICD-10-CM | POA: Diagnosis not present

## 2024-01-20 DIAGNOSIS — F419 Anxiety disorder, unspecified: Secondary | ICD-10-CM | POA: Diagnosis present

## 2024-01-20 DIAGNOSIS — G40409 Other generalized epilepsy and epileptic syndromes, not intractable, without status epilepticus: Secondary | ICD-10-CM | POA: Diagnosis present

## 2024-01-20 DIAGNOSIS — Z79899 Other long term (current) drug therapy: Secondary | ICD-10-CM

## 2024-01-20 DIAGNOSIS — E876 Hypokalemia: Secondary | ICD-10-CM | POA: Diagnosis present

## 2024-01-20 DIAGNOSIS — R569 Unspecified convulsions: Secondary | ICD-10-CM | POA: Diagnosis not present

## 2024-01-20 DIAGNOSIS — G9349 Other encephalopathy: Secondary | ICD-10-CM | POA: Diagnosis present

## 2024-01-20 DIAGNOSIS — R4701 Aphasia: Principal | ICD-10-CM | POA: Diagnosis present

## 2024-01-20 DIAGNOSIS — G8384 Todd's paralysis (postepileptic): Secondary | ICD-10-CM | POA: Diagnosis present

## 2024-01-20 DIAGNOSIS — E872 Acidosis, unspecified: Secondary | ICD-10-CM | POA: Diagnosis present

## 2024-01-20 DIAGNOSIS — R29707 NIHSS score 7: Secondary | ICD-10-CM | POA: Diagnosis present

## 2024-01-20 DIAGNOSIS — I69131 Monoplegia of upper limb following nontraumatic intracerebral hemorrhage affecting right dominant side: Secondary | ICD-10-CM | POA: Diagnosis not present

## 2024-01-20 DIAGNOSIS — R000 Tachycardia, unspecified: Secondary | ICD-10-CM | POA: Diagnosis present

## 2024-01-20 DIAGNOSIS — D72829 Elevated white blood cell count, unspecified: Secondary | ICD-10-CM | POA: Diagnosis present

## 2024-01-20 DIAGNOSIS — Z880 Allergy status to penicillin: Secondary | ICD-10-CM | POA: Diagnosis not present

## 2024-01-20 DIAGNOSIS — Z91013 Allergy to seafood: Secondary | ICD-10-CM | POA: Diagnosis not present

## 2024-01-20 DIAGNOSIS — G40909 Epilepsy, unspecified, not intractable, without status epilepticus: Secondary | ICD-10-CM | POA: Diagnosis not present

## 2024-01-20 DIAGNOSIS — Z87820 Personal history of traumatic brain injury: Secondary | ICD-10-CM | POA: Diagnosis not present

## 2024-01-20 DIAGNOSIS — Z8679 Personal history of other diseases of the circulatory system: Secondary | ICD-10-CM

## 2024-01-20 DIAGNOSIS — G9389 Other specified disorders of brain: Secondary | ICD-10-CM | POA: Diagnosis present

## 2024-01-20 DIAGNOSIS — I69398 Other sequelae of cerebral infarction: Secondary | ICD-10-CM | POA: Diagnosis not present

## 2024-01-20 DIAGNOSIS — Z888 Allergy status to other drugs, medicaments and biological substances status: Secondary | ICD-10-CM | POA: Diagnosis not present

## 2024-01-20 DIAGNOSIS — F4310 Post-traumatic stress disorder, unspecified: Secondary | ICD-10-CM | POA: Diagnosis present

## 2024-01-20 DIAGNOSIS — G9340 Encephalopathy, unspecified: Secondary | ICD-10-CM | POA: Diagnosis present

## 2024-01-20 DIAGNOSIS — S069XAA Unspecified intracranial injury with loss of consciousness status unknown, initial encounter: Secondary | ICD-10-CM | POA: Diagnosis present

## 2024-01-20 DIAGNOSIS — F29 Unspecified psychosis not due to a substance or known physiological condition: Secondary | ICD-10-CM | POA: Diagnosis present

## 2024-01-20 DIAGNOSIS — F17210 Nicotine dependence, cigarettes, uncomplicated: Secondary | ICD-10-CM | POA: Diagnosis present

## 2024-01-20 DIAGNOSIS — R319 Hematuria, unspecified: Secondary | ICD-10-CM | POA: Diagnosis present

## 2024-01-20 DIAGNOSIS — R4182 Altered mental status, unspecified: Secondary | ICD-10-CM | POA: Diagnosis not present

## 2024-01-20 LAB — CBC WITH DIFFERENTIAL/PLATELET
Abs Immature Granulocytes: 0.03 K/uL (ref 0.00–0.07)
Basophils Absolute: 0.1 K/uL (ref 0.0–0.1)
Basophils Relative: 1 %
Eosinophils Absolute: 0.1 K/uL (ref 0.0–0.5)
Eosinophils Relative: 1 %
HCT: 42.3 % (ref 36.0–46.0)
Hemoglobin: 13.5 g/dL (ref 12.0–15.0)
Immature Granulocytes: 0 %
Lymphocytes Relative: 19 %
Lymphs Abs: 2 K/uL (ref 0.7–4.0)
MCH: 31.6 pg (ref 26.0–34.0)
MCHC: 31.9 g/dL (ref 30.0–36.0)
MCV: 99.1 fL (ref 80.0–100.0)
Monocytes Absolute: 1.1 K/uL — ABNORMAL HIGH (ref 0.1–1.0)
Monocytes Relative: 11 %
Neutro Abs: 6.8 K/uL (ref 1.7–7.7)
Neutrophils Relative %: 68 %
Platelets: 281 K/uL (ref 150–400)
RBC: 4.27 MIL/uL (ref 3.87–5.11)
RDW: 12.4 % (ref 11.5–15.5)
WBC: 10.1 K/uL (ref 4.0–10.5)
nRBC: 0 % (ref 0.0–0.2)

## 2024-01-20 LAB — COMPREHENSIVE METABOLIC PANEL WITH GFR
ALT: 57 U/L — ABNORMAL HIGH (ref 0–44)
AST: 34 U/L (ref 15–41)
Albumin: 3.8 g/dL (ref 3.5–5.0)
Alkaline Phosphatase: 100 U/L (ref 38–126)
Anion gap: 8 (ref 5–15)
BUN: <5 mg/dL — ABNORMAL LOW (ref 6–20)
CO2: 25 mmol/L (ref 22–32)
Calcium: 9 mg/dL (ref 8.9–10.3)
Chloride: 104 mmol/L (ref 98–111)
Creatinine, Ser: 0.63 mg/dL (ref 0.44–1.00)
GFR, Estimated: >60 mL/min (ref >60)
Glucose, Bld: 93 mg/dL (ref 70–99)
Potassium: 3.8 mmol/L (ref 3.5–5.1)
Sodium: 137 mmol/L (ref 135–145)
Total Bilirubin: 0.8 mg/dL (ref 0.0–1.2)
Total Protein: 7 g/dL (ref 6.5–8.1)

## 2024-01-20 LAB — PHENYTOIN LEVEL, TOTAL: Phenytoin Lvl: 8.5 ug/mL — ABNORMAL LOW (ref 10.0–20.0)

## 2024-01-20 LAB — ETHANOL: Alcohol, Ethyl (B): <15 mg/dL (ref <15)

## 2024-01-20 LAB — PROTIME-INR
INR: 1.1 (ref 0.8–1.2)
Prothrombin Time: 14.6 s (ref 11.4–15.2)

## 2024-01-20 LAB — I-STAT CHEM 8, ED
BUN: <3 mg/dL — ABNORMAL LOW (ref 6–20)
Calcium, Ion: 1.16 mmol/L (ref 1.15–1.40)
Chloride: 101 mmol/L (ref 98–111)
Creatinine, Ser: 0.6 mg/dL (ref 0.44–1.00)
Glucose, Bld: 86 mg/dL (ref 70–99)
HCT: 36 % (ref 36.0–46.0)
Hemoglobin: 12.2 g/dL (ref 12.0–15.0)
Potassium: 3.5 mmol/L (ref 3.5–5.1)
Sodium: 138 mmol/L (ref 135–145)
TCO2: 26 mmol/L (ref 22–32)

## 2024-01-20 LAB — RAPID URINE DRUG SCREEN, HOSP PERFORMED
Amphetamines: NOT DETECTED
Barbiturates: NOT DETECTED
Benzodiazepines: NOT DETECTED
Cocaine: NOT DETECTED
Opiates: NOT DETECTED
Tetrahydrocannabinol: NOT DETECTED

## 2024-01-20 LAB — APTT: aPTT: 33 s (ref 24–36)

## 2024-01-20 LAB — HCG, SERUM, QUALITATIVE: Preg, Serum: NEGATIVE

## 2024-01-20 MED ORDER — THIAMINE MONONITRATE 100 MG PO TABS
100.0000 mg | ORAL_TABLET | Freq: Every day | ORAL | Status: DC
  Administered 2024-01-20 – 2024-01-22 (×3): 100 mg via ORAL
  Filled 2024-01-20 (×5): qty 1

## 2024-01-20 MED ORDER — ACETAMINOPHEN 325 MG PO TABS: 650.0000 mg | ORAL_TABLET | Freq: Four times a day (QID) | ORAL | Status: DC | PRN

## 2024-01-20 MED ORDER — RIVAROXABAN 10 MG PO TABS: 10.0000 mg | ORAL_TABLET | Freq: Every day | ORAL | Status: DC

## 2024-01-20 MED ORDER — POLYETHYLENE GLYCOL 3350 17 G PO PACK: 17.0000 g | PACK | Freq: Every day | ORAL | Status: DC | PRN

## 2024-01-20 MED ORDER — LEVETIRACETAM (KEPPRA) 500 MG/5 ML ADULT IV PUSH
500.0000 mg | Freq: Once | INTRAVENOUS | Status: AC
  Administered 2024-01-20: 500 mg via INTRAVENOUS
  Filled 2024-01-20: qty 5

## 2024-01-20 MED ORDER — NICOTINE 14 MG/24HR TD PT24
14.0000 mg | MEDICATED_PATCH | Freq: Every day | TRANSDERMAL | Status: DC
  Administered 2024-01-22: 14 mg via TRANSDERMAL
  Filled 2024-01-20: qty 1

## 2024-01-20 MED ORDER — IOHEXOL 350 MG/ML SOLN
60.0000 mL | Freq: Once | INTRAVENOUS | Status: AC | PRN
  Administered 2024-01-20: 60 mL via INTRAVENOUS

## 2024-01-20 MED ORDER — ACETAMINOPHEN 650 MG RE SUPP: 650.0000 mg | Freq: Four times a day (QID) | RECTAL | Status: DC | PRN

## 2024-01-20 MED ORDER — HEPARIN SODIUM (PORCINE) 5000 UNIT/ML IJ SOLN
5000.0000 [IU] | Freq: Three times a day (TID) | INTRAMUSCULAR | Status: DC
  Administered 2024-01-20 – 2024-01-22 (×5): 5000 [IU] via SUBCUTANEOUS
  Filled 2024-01-20 (×5): qty 1

## 2024-01-20 MED ORDER — FOLIC ACID 1 MG PO TABS
1.0000 mg | ORAL_TABLET | Freq: Every day | ORAL | Status: DC
  Administered 2024-01-20 – 2024-01-22 (×3): 1 mg via ORAL
  Filled 2024-01-20 (×2): qty 1

## 2024-01-20 MED ORDER — LEVETIRACETAM ER 500 MG PO TB24
3000.0000 mg | ORAL_TABLET | Freq: Every day | ORAL | Status: DC
  Administered 2024-01-20 – 2024-01-21 (×2): 3000 mg via ORAL
  Filled 2024-01-20 (×6): qty 6

## 2024-01-20 MED ORDER — PHENYTOIN SODIUM EXTENDED 100 MG PO CAPS
300.0000 mg | ORAL_CAPSULE | Freq: Every day | ORAL | Status: DC
  Administered 2024-01-20 – 2024-01-21 (×2): 300 mg via ORAL
  Filled 2024-01-20 (×2): qty 3

## 2024-01-20 NOTE — ED Notes (Signed)
 EEG procedure started, family at bedside

## 2024-01-20 NOTE — H&P (Cosign Needed Addendum)
 Date: 01/20/2024               Patient Name:  Alyssa Little MRN: [id number]  DOB: Jun 25, 1980 Age / Sex: 43 y.o., female   PCP: Waymond Cart, MD         Medical Service: Internal Medicine Teaching Service         Attending Physician: Dr. Mliss Pouch      First Contact: Sallyanne Primas, DO}    Second Contact: Dr. Toma Edwards, DO         Pager Information: First Contact Pager: [phone]   Second Contact Pager: [phone]   SUBJECTIVE   Chief Complaint: Encephalopathy  History of Present Illness: Alyssa Little is a 43 y.o. female with PMH of grand-mal seizures, SAH (2019), TBI (2019).   Assessed bedside.  Patient unable to give history due to disorientation.  Significant other called on the phone.  History below provided by ED course, significant other, previous H&P's.  Last known well time 3 PM yesterday; alert and oriented x 4 without aphasia.  Returned around 2 AM next morning and noticed confusion and reported right arm weakness/immobility.  She took her medications at midnight.  Her brother reportedly passed away on 31-Jan-2024.  Significant other states that she was not close with her brother however she did play a significant role in raising him.  Last admission 04/12/2023: Seizure disorder, alcohol use disorder, postictal psychosis.  During this presentation, she was brought in by her husband after being found abnormal at home with no witnessed seizures, falls or other inciting events.  She had dense receptive aphasia, seemingly tangential thought processes, and may have been responding to internal stimuli.  Acute stroke was also ruled out.  It was thought that she was having postictal dysphagia with acute psychosis with underlying traumatic brain injury contributing. She was discharged on Keppra  3000 mg every night and phenytoin  300 mg every night. Postictal psychosis recommendations included follow-up with psychiatry and risperidone  1 mg twice a day.  At hospital  follow-up, the risperidone  was discontinued.  It has since reported (per partner) that she has stopped drinking.   Patient denies pain and dysuria  ED Course: ED note states that patient was brought for further evaluation and was minimally communicative with a flat affect.  Dr. Rosemarie of neurology advised that this is likely more related to seizures and recommended admission for EEG and seizure workup. Labs significant for: Phenytoin  level 8.5, levetiracetam  level pending, ALT 57, CBC unremarkable, hCG negative, ethanol <15, drug screen negative Imaging: CT angio head and neck and CT noncontrast unremarkable.  Code stroke negative Received: Keppra  500 mg Consulted: IMTS, neurology (recommended seizure workup)  Meds:  Patient reported: *Keppra  XR 500 mg 6 tablets by mouth at bedtime *Phenytek  300 mg ER capsule daily *Propranolol  10 mg twice daily as needed *Thiamine  100 mg *Folic acid  1 mg Hydroxyzine  10 mg 3 times daily as needed? Multivitamin Ibuprofen 200 mg Q6H as needed Tylenol  500 mg every 6 hours as needed  Risperidone  1 mg -last dispensed 04/18/2023? Naltrexone  50 mg -last dispensed 04/18/2023?  No outpatient medications have been marked as taking for the 01/20/24 encounter [facility] Encounter).    Past Medical History Seizure, TBI, [facility]  Past Surgical History Past Surgical History:  Procedure Laterality Date   CHOLECYSTECTOMY      Provided from Social:  Lives With: Significant other at home Occupation: Support: Significant other Level of Function: Independent in ADLs/IADLs PCP:  Waymond Cart, MD  Substances: -Tobacco:  2 to 3 cigarettes a day -Alcohol: Previous drinker, since last hospitalization has stopped drinking. -Recreational Drug: THC drinks once every 3 days  Family History:  Family History  Family history unknown: Yes     Allergies: Allergies as of 01/20/2024 - Review Complete 01/20/2024  Allergen Reaction Noted   Shellfish allergy Anaphylaxis  03/05/2013   Flagyl  [metronidazole ] Hives 11/08/2020   Penicillins Hives, Rash, and Other (See Comments) 02/26/2018    Review of Systems: A complete ROS was negative except as per HPI.   OBJECTIVE:   Physical Exam: Blood pressure 109/83, pulse 80, temperature 98.4 F (36.9 C), temperature source Oral, resp. rate 20, height 5' 2 (1.575 m), weight 45.4 kg, SpO2 98%.  Physical Exam Constitutional:      General: She is not in acute distress.    Appearance: She is not ill-appearing or toxic-appearing.  HENT:     Mouth/Throat:     Dentition: Abnormal dentition. Gum lesions present.  Eyes:     Pupils: Pupils are equal, round, and reactive to light.  Cardiovascular:     Rate and Rhythm: Regular rhythm. Bradycardia present.     Heart sounds: Normal heart sounds. No murmur heard.    No friction rub. No gallop.  Pulmonary:     Effort: Pulmonary effort is normal.     Breath sounds: Normal breath sounds. No stridor. No wheezing, rhonchi or rales.  Abdominal:     General: Abdomen is flat.     Palpations: Abdomen is soft.     Tenderness: There is no abdominal tenderness. There is no guarding.  Skin:    General: Skin is warm and dry.  Neurological:     Mental Status: She is alert. She is disoriented.     Comments: Not oriented to self, location, time. Able to follow some commands (follow my finger with your eyes), but unable to follow others (make a fist, squeeze my fingers).  Receptive aphasia present (speech is fluent but nonsensical)  Psychiatric:        Attention and Perception: She is inattentive.        Mood and Affect: Mood is anxious. Affect is flat. Affect is not angry or inappropriate.        Behavior: Behavior is not agitated.        Thought Content: Thought content is not paranoid.     Labs: CBC    Component Value Date/Time   WBC 10.1 01/20/2024 1107   RBC 4.27 01/20/2024 1107   HGB 12.2 01/20/2024 1426   HGB 13.2 02/04/2023 1451   HCT 36.0 01/20/2024 1426   HCT  40.0 02/04/2023 1451   PLT 281 01/20/2024 1107   PLT 337 02/04/2023 1451   MCV 99.1 01/20/2024 1107   MCV 104 (H) 02/04/2023 1451   MCH 31.6 01/20/2024 1107   MCHC 31.9 01/20/2024 1107   RDW 12.4 01/20/2024 1107   RDW 12.3 02/04/2023 1451   LYMPHSABS 2.0 01/20/2024 1107   LYMPHSABS 2.0 02/04/2023 1451   MONOABS 1.1 (H) 01/20/2024 1107   EOSABS 0.1 01/20/2024 1107   EOSABS 0.1 02/04/2023 1451   BASOSABS 0.1 01/20/2024 1107   BASOSABS 0.1 02/04/2023 1451     CMP     Component Value Date/Time   NA 138 01/20/2024 1426   NA 142 03/21/2021 1603   K 3.5 01/20/2024 1426   CL 101 01/20/2024 1426   CO2 25 01/20/2024 1107   GLUCOSE 86 01/20/2024 1426   BUN <3 (L) 01/20/2024 1426  BUN 8 03/21/2021 1603   CREATININE 0.60 01/20/2024 1426   CALCIUM  9.0 01/20/2024 1107   PROT 7.0 01/20/2024 1107   PROT 6.4 03/21/2021 1603   ALBUMIN  3.8 01/20/2024 1107   ALBUMIN  4.0 03/21/2021 1603   AST 34 01/20/2024 1107   ALT 57 (H) 01/20/2024 1107   ALKPHOS 100 01/20/2024 1107   BILITOT 0.8 01/20/2024 1107   BILITOT <0.2 03/21/2021 1603   GFRNONAA >60 01/20/2024 1107   GFRAA >60 04/09/2019 0820    Imaging:  CT ANGIO HEAD NECK W WO CM (CODE STROKE) Result Date: 01/20/2024 CLINICAL DATA:  Neuro deficit, acute, stroke suspected. EXAM: CT ANGIOGRAPHY HEAD AND NECK WITH AND WITHOUT CONTRAST TECHNIQUE: Multidetector CT imaging of the head and neck was performed using the standard protocol during bolus administration of intravenous contrast. Multiplanar CT image reconstructions and MIPs were obtained to evaluate the vascular anatomy. Carotid stenosis measurements (when applicable) are obtained utilizing NASCET criteria, using the distal internal carotid diameter as the denominator. RADIATION DOSE REDUCTION: This exam was performed according to the departmental dose-optimization program which includes automated exposure control, adjustment of the mA and/or kV according to patient size and/or use of  iterative reconstruction technique. CONTRAST:  60mL OMNIPAQUE  IOHEXOL  350 MG/ML SOLN COMPARISON:  None Available. FINDINGS: CTA NECK FINDINGS Aortic arch: Standard branching. Widely patent brachiocephalic and subclavian arteries. Right carotid system: Patent without evidence of stenosis or dissection. Left carotid system: Patent without evidence of stenosis or dissection. Vertebral arteries: Patent without evidence of stenosis or dissection. Mildly dominant right vertebral artery. Skeleton: No acute osseous abnormality or suspicious lesion. Other neck: No evidence of cervical lymphadenopathy or mass. Upper chest: Clear lung apices. Review of the MIP images confirms the above findings CTA HEAD FINDINGS Anterior circulation: The internal carotid arteries are widely patent from skull base to carotid termini. ACAs and MCAs are patent without evidence of a proximal branch occlusion or significant proximal stenosis. No aneurysm is identified. Posterior circulation: The intracranial vertebral arteries are patent to the basilar. Patent PICA and SCA origins are visualized bilaterally. The basilar artery is widely patent. The PCAs are patent with fetal type origins bilaterally and no evidence of a significant proximal stenosis. No aneurysm is identified. Venous sinuses: Patent. Anatomic variants: Fetal origin of the PCAs. Review of the MIP images confirms the above findings The study was reviewed in person with Dr. Rosemarie on 01/20/2024 at 1:55 p.m. IMPRESSION: Negative head and neck CTA. Electronically Signed   By: Dasie Hamburg M.D.   On: 01/20/2024 14:30   CT HEAD CODE STROKE WO CONTRAST Result Date: 01/20/2024 CLINICAL DATA:  Code stroke. Neuro deficit, acute, stroke suspected. Seizure. Right grip weakness. EXAM: CT HEAD WITHOUT CONTRAST TECHNIQUE: Contiguous axial images were obtained from the base of the skull through the vertex without intravenous contrast. RADIATION DOSE REDUCTION: This exam was performed according to  the departmental dose-optimization program which includes automated exposure control, adjustment of the mA and/or kV according to patient size and/or use of iterative reconstruction technique. COMPARISON:  Head CT 01/20/2024 at 11:51 a.m. and MRI 04/12/2023 FINDINGS: Brain: There is no evidence of an acute infarct, intracranial hemorrhage, mass, midline shift, hydrocephalus, or extra-axial fluid collection. There is mild cerebral atrophy. Encephalomalacia is again noted in both frontal lobes. Vascular: No hyperdense vessel. Skull: No fracture or suspicious lesion. Sinuses/Orbits: Visualized paranasal sinuses and mastoid air cells are clear. Unremarkable included orbits. Other: None. ASPECTS [facility] Stroke Program Early CT Score) - Ganglionic level infarction (caudate, lentiform nuclei,  internal capsule, insula, M1-M3 cortex): 7 - Supraganglionic infarction (M4-M6 cortex): 3 Total score (0-10 with 10 being normal): 10 These results were communicated to Dr. Rosemarie at 1:53 pm on 01/20/2024 by text page via the [facility] messaging system. IMPRESSION: 1. No evidence of acute intracranial abnormality. ASPECTS of 10. 2. Bifrontal encephalomalacia. Electronically Signed   By: Dasie Hamburg M.D.   On: 01/20/2024 13:54   CT Head Wo Contrast Result Date: 01/20/2024 EXAM: CT HEAD WITHOUT CONTRAST 01/20/2024 11:56:54 AM TECHNIQUE: CT of the head was performed without the administration of intravenous contrast. Automated exposure control, iterative reconstruction, and/or weight based adjustment of the mA/kV was utilized to reduce the radiation dose to as low as reasonably achievable. COMPARISON: CT of the head dated 04/12/2023. CLINICAL HISTORY: Mental status change, unknown cause. Triage notes; PT to etc from home with CO a seizure. Seizure had a petite seizure per support person. PER support unwitnessed but he can tell she had one because she is postictal and not herself. PER support person she does not take a blood thinner. PT  takes ; phenytoin  300 mg per support person. PT last took this at Midnight. PT unable to give name and birthday at this time. FINDINGS: BRAIN AND VENTRICLES: No acute hemorrhage. No evidence of acute infarct. No hydrocephalus. No extra-axial collection. No mass effect or midline shift. There are chronic encephalomalacia changes present immediately within the frontal lobes bilaterally. There is age-related cerebral volume loss. ORBITS: No acute abnormality. SINUSES: No acute abnormality. SOFT TISSUES AND SKULL: No acute soft tissue abnormality. No skull fracture. IMPRESSION: 1. No acute intracranial abnormality. 2. Chronic encephalomalacia in the frontal lobes bilaterally and age-related cerebral volume loss. Electronically signed by: Evalene Coho MD 01/20/2024 11:59 AM EDT RP Workstation: [id number]     EKG: personally reviewed my interpretation is sinus rhythm.   ASSESSMENT & PLAN:   Assessment & Plan by Problem: Principal Problem:   Receptive aphasia Active Problems:   Seizures (HCC)   TBI (traumatic brain injury) (HCC)   Alyssa Little is a 43 y.o. person living with a history of grand mal seizures, TBI, SAH who presented with encephalopathy and admitted for seizure workup on hospital day 0  #Altered mental status #Expressive aphasia #History of seizure #History of postictal psychosis #Encephalomalacia Patient presented with disorientation and right arm weakness that started at 3 AM on 01/20/2024, last known normal at 3 PM on 01/19/2024.  No bowel or bladder incontinence, no tongue biting.  On exam, patient does not answer all questions, is not oriented x 4, attempts to speak in few words that are nonsensical, does not answer questions with appropriate answers.  She has a very flat affect.  She initially moved her right upper extremity then held it at a elbow flexion position for several minutes, even when asked to move. She does follow neurology in the outpatient setting, most  recent visit on 10/21/2023.  Her first recorded seizure 05/2016 with symptoms of LOC and incoherent speech.  Denied any acute pain, dysuria.  Of note, patient does follow behavioral health, most recent visit on 08/01/2023 then noted for postictal psychosis during her hospitalization in 03/2023 where she was treated for psychosis by psychiatry and neurology.  Patient significant other did report major life stressor, patient's brother passed away on 24-Jan-2024, it was her younger brother who she raised.  Pertinent objective findings are: CT code stroke activated in ED, CT head showed bifrontal encephalomalacia.  CTA head/neck negative for LVO or stenosis.  Differential  includes but not limited to postictal state from unwitnessed seizure, metabolic encephalopathy, structural deficit from prior TBI, toxic encephalopathy, postictal psychosis/catatonia.  Lab findings showed normal electrolytes, no leukocytosis, normal ethanol level.  UDS negative.  Neurology on board, patient was loaded with IV Keppra  500 mg in the ED.  Further workup as follows: - Follow-up CK - Follow-up acetaminophen  level, salicylate level, TSH, UA - Follow-up spot EEG - Continue PTA phenytoin  300 mg daily and Keppra  3 g daily - Seizure precautions - If further lab findings are unclear for etiology, will consider psych consult tomorrow. - Holding home medicine propranolol  and Atarax .  Other medications ordered:  -Nicotine  patches  Best practice: Diet: Normal VTE: Heparin  IVF: None,None Code: Full  Disposition planning: Prior to Admission Living Arrangement: Home, living partner Anticipated Discharge Location: Home  Dispo: Admit patient to Observation with expected length of stay less than 2 midnights.  Signed: Benuel Braun, DO Internal Medicine Resident  01/20/2024, 8:19 PM  On Call pager: [phone]

## 2024-01-20 NOTE — ED Notes (Signed)
 PT ambulated with an even and steady gate to the rest room. Urine sent.

## 2024-01-20 NOTE — ED Notes (Addendum)
 CCMD called.

## 2024-01-20 NOTE — Progress Notes (Signed)
 EEG complete - results pending

## 2024-01-20 NOTE — ED Triage Notes (Signed)
 PT to etc from home with CO a seizure. Seizure had a petite seizure per support person. PER support unwitnessed but he can tell she had one because she is postictal and not herself. PER support person she does not take a blood thinner.  PT takes phenytoin  300 mg per support person.  PT last took this at Midnight. PT unable to give name and birthday at this time.

## 2024-01-20 NOTE — Telephone Encounter (Signed)
 FYI Only or Action Required?: FYI only for provider.  Patient was last seen in primary care on 04/24/2023 by Marylu Gee, DO.  Called Nurse Triage reporting Seizures.  Symptoms began yesterday.  Triage Disposition: Go to ED Now (or PCP Triage)  Patient/caregiver understands and will follow disposition?: Yes               Copied from CRM [id number]. Topic: Clinical - Red Word Triage >> Jan 20, 2024  9:22 AM Alyssa Little wrote: Red Word that prompted transfer to Nurse Triage: seizure on 10/05. Speech slow she has not come out of it yet. RT side having issue Reason for Disposition  Patient sounds very sick or weak to the triager  Answer Assessment - Initial Assessment Questions This RN recommends pt goes to ED and has another adult drive pt there. Pt significant other is agreeable.   This RN spoke with pt's significant other, Alyssa Little. Pt had a petit seizure last night.   Speech slower than normal Difficulty articulating Difficulty with R arm and hand- a little bit numb, feels weird, hard to maneuver, can't pick up  Protocols used: [facility]

## 2024-01-20 NOTE — Hospital Course (Addendum)
 She is oriented to herself but hard to express it, said she is in [place] house but she understood the question wrongly as where she lives, could say year 2025.  Could express she is here due to seizure activity and said it was early in afternoon on Sunday, did take medication, trouble swallowing pills, had emotional distress due to her younger brother loss. Visit neurology once a year. Willing to see Psychiatrist cause her partner claimed this episode was different, this time was ignoring, not speaking and not moving right hand, dropped things from right hand even now. Difficulty to express some words like tablet, had episode of fall and brain trauma in 2019.       20 [age] yo F with unwitnessed falls with seizures and extensive traumatic SAH.  Alyssa Little is a 43 y.o. female With an unknown past medical history who presents for head trauma, unresponsiveness, seizures, and respiratory failure. Patient brought in by EMS after being found down with evidence of head trauma in the break room at her office. Unknown past medical history or other preceding symptoms. Patient has a large laceration to the side of her forehead that was bandaged by EMS. They report that she has had multiple seizures in route and has had GCS of 3 symptoms. Level 1 trauma was activated upon arrival.  Level 5 caveat as patient is unresponsive.  On initial evaluation, airway is not intact. Patient's oxygen saturations are in the 40s and she has a GCS of 3. She is not protecting airway.  Intubation was performed by resident physician without difficulty. Patient's oxygen saturations improved however he remained tachycardic.  Breath sounds were rhonchorous but equal bilaterally. Rest of exam showed left eye deviation bilaterally with 4 mm and reactive pupils. Neurology came to bedside just prior to RSI and intubation.  Trauma surgery was present shortly after arrival and during intubation. Patient taken to CT scanner  for further imaging and she was found to have subarachnoid hemorrhage. Patient was started on propofol  and given Keppra .  Surgery will repair laceration.  Patient admitted to trauma ICU for further management.               - History seizure TBI SAH  = AMS - concerned  LKN 2P:30 PM yesterday  2-3 AM, speech disordered, RA weakness - past with seizures 1 PM - AO , started to get confused and non senical  - Code stroke - negative  Sethi = recommended seziure work up   Low phenytoin    : Called significant  LKN 3 PM. AO x 4. No aphasia.   This morning, R arm was not moving and - Did take her medication at midnight   No alcohol beverage.  Smoke: ~ 2-3 cigrattes THC drinks - Total wine - once every 3 days.   Her brother has away on Wednesday.      =============================  #Altered mental status #Expressive aphasia #History of seizure #History of postictal psychosis #Encephalomalacia Patient presented with disorientation and right arm weakness that started at 3 AM on 01/20/2024, last known normal at 3 PM on 01/19/2024.  No bowel or bladder incontinence, no tongue biting.  On exam, patient does not answer all questions, is not oriented x 4, attempts to speak in few words that are nonsensical, does not answer questions with appropriate answers.  She has a very flat affect.  She initially moved her right upper extremity then held it at a elbow flexion position for several minutes, even when  asked to move. She does follow neurology in the outpatient setting, most recent visit on 10/21/2023.  Her first recorded seizure 05/2016 with symptoms of LOC and incoherent speech.  Denied any acute pain, dysuria.  Of note, patient does follow behavioral health, most recent visit on 08/01/2023 then noted for postictal psychosis during her hospitalization in 03/2023 where she was treated for psychosis by psychiatry and neurology.  Patient significant other did report major life  stressor, patient's brother passed away on 01/27/2024, it was her younger brother who she raised.  Pertinent objective findings are: CT code stroke activated in ED, CT head showed bifrontal encephalomalacia.  CTA head/neck negative for LVO or stenosis.  Differential includes but not limited to postictal state from unwitnessed seizure, metabolic encephalopathy, structural deficit from prior TBI, toxic encephalopathy, postictal psychosis/catatonia.  Lab findings showed normal electrolytes, no leukocytosis, normal ethanol level.  UDS negative.  Neurology on board, patient was loaded with IV Keppra  500 mg in the ED.  Further workup as follows: - Follow-up CK - Follow-up acetaminophen  level, salicylate level, TSH, UA - Follow-up spot EEG - Continue PTA phenytoin  300 mg daily and Keppra  3 g daily - Seizure precautions - If further lab findings are unclear for etiology, will consider psych consult tomorrow.

## 2024-01-20 NOTE — Telephone Encounter (Signed)
 Per chart, pt is currently in the ER.

## 2024-01-20 NOTE — ED Notes (Signed)
 Called CCMD to set pt up on the monitoring system.

## 2024-01-20 NOTE — ED Notes (Signed)
 Called pharmacy for keppra  meds

## 2024-01-20 NOTE — ED Notes (Signed)
 Assuming pt care, pt walk in with complaints of seizure activity and confusion, last known normal time 3pm yesterday. Pt aaox3, disoriented to time, lethargic, cooperative, denies pain/discomfort. Call bell within reach.

## 2024-01-20 NOTE — ED Notes (Signed)
 PT has been unable to give her name and birthday while she has been here. Also notes right grip is weaker than left.  Dr. Freddi notified.

## 2024-01-20 NOTE — ED Provider Notes (Signed)
 [place] EMERGENCY DEPARTMENT AT [facility] Provider Note   CSN: [id number] Arrival date & time: 01/20/24  1000  An emergency department physician performed an initial assessment on this suspected stroke patient at 1331.  Patient presents with: Seizures  HPI Alyssa Little is a 43 y.o. female with history of seizures, SAH, TBI presenting for seizure.  Companied by her partner who reports that her last known well was approximately 3 PM yesterday when he left for work.  He returned around 2 AM the next morning and noticed that she was confused and reporting right arm weakness.  He reports that she has had the symptoms before with seizures in the past.  Symptoms persisted and was brought here for further evaluation.  At this time patient is minimally communicative with a flat affect.  Partner reports that she has been taking her phenytoin  as prescribed to her.  Unable to provide her name or birthday.    Seizures      Prior to Admission medications   Medication Sig Start Date End Date Taking? Authorizing Provider  folic acid  (FOLVITE ) 1 MG tablet Take 1 tablet (1 mg total) by mouth daily. 10/14/23 02/13/24  Waymond Cart, MD  hydrOXYzine  (ATARAX ) 10 MG tablet Take 1 tablet (10 mg total) by mouth 3 (three) times daily as needed. 04/12/23   Khan, Ghalib, MD  ibuprofen (ADVIL) 200 MG tablet Take 200-400 mg by mouth every 6 (six) hours as needed for cramping or headache.    [provider]  levETIRAcetam  (KEPPRA  XR) 500 MG 24 hr tablet Take 6 tablets (3,000 mg total) by mouth at bedtime. 10/10/23   Jolaine Pac, DO  Multiple Vitamin (MULTIVITAMIN WITH MINERALS) TABS tablet Take 1 tablet by mouth daily. 04/18/23   Kandis Perkins, DO  PHENYTEK  300 MG ER capsule Take 1 capsule (300 mg total) by mouth daily. 01/14/24     PHENYTEK  300 MG ER capsule Take one capsule (300 mg dose) by mouth daily. BRAND NAME MEDICALLY NECESSARY 01/15/24     propranolol  (INDERAL ) 10 MG tablet  Take 1 tablet (10 mg total) by mouth 2 (two) times daily as needed. 09/24/23   Marry Clamp, MD  thiamine  (VITAMIN B1) 100 MG tablet Take 1 tablet (100 mg total) by mouth daily. 09/16/23   Masters, Katie, DO  TYLENOL  500 MG tablet Take 500-1,000 mg by mouth every 6 (six) hours as needed for mild pain (or headaches).    [provider]    Allergies: Shellfish allergy, Flagyl  [metronidazole ], and Penicillins    Review of Systems  Neurological:  Positive for seizures.    Physical Exam   Vitals:   01/21/24 0720 01/21/24 0730  BP:  126/80  Pulse: (!) 112   Resp: (!) 25 16  Temp:  98.7 F (37.1 C)  SpO2: 98% 96%    CONSTITUTIONAL:  well-appearing, NAD NEURO: Right arm weakness, flat affect, moving extremities, deferring to her partner to answer questions but nodding her head yes and no, no facial droop noted, having difficulty following simple commands EYES:  eyes equal and reactive ENT/NECK:  Supple, no stridor  CARDIO:  Regular rate and rhythm, appears well-perfused  PULM:  No respiratory distress, CTAB GI/GU:  non-distended, soft MSK/SPINE:  No gross deformities, no edema, moves all extremities  SKIN:  no rash, atraumatic  *Additional and/or pertinent findings included in MDM below  (all labs ordered are listed, but only abnormal results are displayed) Labs Reviewed  COMPREHENSIVE METABOLIC PANEL WITH GFR -  Abnormal; Notable for the following components:      Result Value   BUN <5 (*)    ALT 57 (*)    All other components within normal limits  CBC WITH DIFFERENTIAL/PLATELET - Abnormal; Notable for the following components:   Monocytes Absolute 1.1 (*)    All other components within normal limits  PHENYTOIN  LEVEL, TOTAL - Abnormal; Notable for the following components:   Phenytoin  Lvl 8.5 (*)    All other components within normal limits  BASIC METABOLIC PANEL WITH GFR - Abnormal; Notable for the following components:   Potassium 3.4 (*)    CO2 19 (*)    Calcium   8.8 (*)    Anion gap 16 (*)    All other components within normal limits  CBC - Abnormal; Notable for the following components:   WBC 13.1 (*)    All other components within normal limits  ACETAMINOPHEN  LEVEL - Abnormal; Notable for the following components:   Acetaminophen  (Tylenol ), Serum <10 (*)    All other components within normal limits  SALICYLATE LEVEL - Abnormal; Notable for the following components:   Salicylate Lvl <7.0 (*)    All other components within normal limits  I-STAT CHEM 8, ED - Abnormal; Notable for the following components:   BUN <3 (*)    All other components within normal limits  HCG, SERUM, QUALITATIVE  RAPID URINE DRUG SCREEN, HOSP PERFORMED  ETHANOL  PROTIME-INR  APTT  CK  TSH  LEVETIRACETAM  LEVEL  URINALYSIS, ROUTINE W REFLEX MICROSCOPIC  CBG MONITORING, ED    EKG: EKG Interpretation Date/Time:  Monday January 20 2024 11:31:23 EDT Ventricular Rate:  85 PR Interval:  134 QRS Duration:  81 QT Interval:  386 QTC Calculation: 459 R Axis:   18  Text Interpretation: Sinus rhythm Normal ECG When compared with ECG of EARLIER SAME DATE No significant change was found Confirmed by Raford Lenis ([id number]) on 01/21/2024 3:35:12 AM  Radiology: EEG adult Result Date: 01/20/2024 Shelton Arlin KIDD, MD     01/20/2024  9:58 PM Patient Name: Alyssa Little MRN: [id number] Epilepsy Attending: Arlin KIDD Shelton Referring Physician/Provider: Heddy Barren, DO Date: 01/20/2024 Duration: 23.33 mins Patient history:  43 y.o. female with hx of traumatic brain injury, subarachnoid hemorrhage and seizure disorder.  She was brought in by her partner who reported that she appeared to be confused and having some right-sided weakness. EEG to evaluate for seizure Level of alertness: Awake AEDs during EEG study: LEV, PHT Technical aspects: This EEG study was done with scalp electrodes positioned according to the 10-20 International system of electrode placement. Electrical  activity was reviewed with band pass filter of 1-70Hz , sensitivity of 7 uV/mm, display speed of 82mm/sec with a 60Hz  notched filter applied as appropriate. EEG data were recorded continuously and digitally stored.  Video monitoring was available and reviewed as appropriate. Description:  The posterior dominant rhythm consists of 9 Hz activity of moderate voltage (25-35 uV) seen predominantly in posterior head regions, asymmetric ( left<right) and reactive to eye opening and eye closing. EEG showed continuous 3 to 5 Hz theta-delta slowing in left hemisphere, maximal left temporal region. Hyperventilation and photic stimulation were not performed.  ABNORMALITY - Continuous slow, left hemisphere, maximal left temporal region  IMPRESSION: This technically difficult study is suggestive of cortical dysfunction arising from left hemisphere, maximal left temporal region likely secondary to underlying structural abnormality, post-ictal state. No seizures or epileptiform discharges were seen throughout the recording.  Priyanka O Yadav  CT ANGIO HEAD NECK W WO CM (CODE STROKE) Result Date: 01/20/2024 CLINICAL DATA:  Neuro deficit, acute, stroke suspected. EXAM: CT ANGIOGRAPHY HEAD AND NECK WITH AND WITHOUT CONTRAST TECHNIQUE: Multidetector CT imaging of the head and neck was performed using the standard protocol during bolus administration of intravenous contrast. Multiplanar CT image reconstructions and MIPs were obtained to evaluate the vascular anatomy. Carotid stenosis measurements (when applicable) are obtained utilizing NASCET criteria, using the distal internal carotid diameter as the denominator. RADIATION DOSE REDUCTION: This exam was performed according to the departmental dose-optimization program which includes automated exposure control, adjustment of the mA and/or kV according to patient size and/or use of iterative reconstruction technique. CONTRAST:  60mL OMNIPAQUE  IOHEXOL  350 MG/ML SOLN COMPARISON:  None  Available. FINDINGS: CTA NECK FINDINGS Aortic arch: Standard branching. Widely patent brachiocephalic and subclavian arteries. Right carotid system: Patent without evidence of stenosis or dissection. Left carotid system: Patent without evidence of stenosis or dissection. Vertebral arteries: Patent without evidence of stenosis or dissection. Mildly dominant right vertebral artery. Skeleton: No acute osseous abnormality or suspicious lesion. Other neck: No evidence of cervical lymphadenopathy or mass. Upper chest: Clear lung apices. Review of the MIP images confirms the above findings CTA HEAD FINDINGS Anterior circulation: The internal carotid arteries are widely patent from skull base to carotid termini. ACAs and MCAs are patent without evidence of a proximal branch occlusion or significant proximal stenosis. No aneurysm is identified. Posterior circulation: The intracranial vertebral arteries are patent to the basilar. Patent PICA and SCA origins are visualized bilaterally. The basilar artery is widely patent. The PCAs are patent with fetal type origins bilaterally and no evidence of a significant proximal stenosis. No aneurysm is identified. Venous sinuses: Patent. Anatomic variants: Fetal origin of the PCAs. Review of the MIP images confirms the above findings The study was reviewed in person with Dr. Rosemarie on 01/20/2024 at 1:55 p.m. IMPRESSION: Negative head and neck CTA. Electronically Signed   By: Dasie Hamburg M.D.   On: 01/20/2024 14:30   CT HEAD CODE STROKE WO CONTRAST Result Date: 01/20/2024 CLINICAL DATA:  Code stroke. Neuro deficit, acute, stroke suspected. Seizure. Right grip weakness. EXAM: CT HEAD WITHOUT CONTRAST TECHNIQUE: Contiguous axial images were obtained from the base of the skull through the vertex without intravenous contrast. RADIATION DOSE REDUCTION: This exam was performed according to the departmental dose-optimization program which includes automated exposure control, adjustment of  the mA and/or kV according to patient size and/or use of iterative reconstruction technique. COMPARISON:  Head CT 01/20/2024 at 11:51 a.m. and MRI 04/12/2023 FINDINGS: Brain: There is no evidence of an acute infarct, intracranial hemorrhage, mass, midline shift, hydrocephalus, or extra-axial fluid collection. There is mild cerebral atrophy. Encephalomalacia is again noted in both frontal lobes. Vascular: No hyperdense vessel. Skull: No fracture or suspicious lesion. Sinuses/Orbits: Visualized paranasal sinuses and mastoid air cells are clear. Unremarkable included orbits. Other: None. ASPECTS (Alberta Stroke Program Early CT Score) - Ganglionic level infarction (caudate, lentiform nuclei, internal capsule, insula, M1-M3 cortex): 7 - Supraganglionic infarction (M4-M6 cortex): 3 Total score (0-10 with 10 being normal): 10 These results were communicated to Dr. Rosemarie at 1:53 pm on 01/20/2024 by text page via the [facility] messaging system. IMPRESSION: 1. No evidence of acute intracranial abnormality. ASPECTS of 10. 2. Bifrontal encephalomalacia. Electronically Signed   By: Dasie Hamburg M.D.   On: 01/20/2024 13:54   CT Head Wo Contrast Result Date: 01/20/2024 EXAM: CT HEAD WITHOUT CONTRAST 01/20/2024 11:56:54 AM TECHNIQUE: CT of  the head was performed without the administration of intravenous contrast. Automated exposure control, iterative reconstruction, and/or weight based adjustment of the mA/kV was utilized to reduce the radiation dose to as low as reasonably achievable. COMPARISON: CT of the head dated 04/12/2023. CLINICAL HISTORY: Mental status change, unknown cause. Triage notes; PT to etc from home with CO a seizure. Seizure had a petite seizure per support person. PER support unwitnessed but he can tell she had one because she is postictal and not herself. PER support person she does not take a blood thinner. PT takes ; phenytoin  300 mg per support person. PT last took this at Midnight. PT unable to give name and  birthday at this time. FINDINGS: BRAIN AND VENTRICLES: No acute hemorrhage. No evidence of acute infarct. No hydrocephalus. No extra-axial collection. No mass effect or midline shift. There are chronic encephalomalacia changes present immediately within the frontal lobes bilaterally. There is age-related cerebral volume loss. ORBITS: No acute abnormality. SINUSES: No acute abnormality. SOFT TISSUES AND SKULL: No acute soft tissue abnormality. No skull fracture. IMPRESSION: 1. No acute intracranial abnormality. 2. Chronic encephalomalacia in the frontal lobes bilaterally and age-related cerebral volume loss. Electronically signed by: Evalene Coho MD 01/20/2024 11:59 AM EDT RP Workstation: [id number]     .Critical Care  Performed by: Lang Norleen POUR, PA-C Authorized by: Lang Norleen POUR, PA-C   Critical care provider statement:    Critical care time (minutes):  30   Critical care was necessary to treat or prevent imminent or life-threatening deterioration of the following conditions: concern for stroke.   Critical care was time spent personally by me on the following activities:  Development of treatment plan with patient or surrogate, discussions with consultants, evaluation of patient's response to treatment, examination of patient, ordering and review of laboratory studies, ordering and review of radiographic studies, ordering and performing treatments and interventions, pulse oximetry, re-evaluation of patient's condition and review of old charts    Medications Ordered in the ED  acetaminophen  (TYLENOL ) tablet 650 mg (has no administration in time range)    Or  acetaminophen  (TYLENOL ) suppository 650 mg (has no administration in time range)  polyethylene glycol (MIRALAX / GLYCOLAX) packet 17 g (has no administration in time range)  levETIRAcetam  (KEPPRA  XR) 24 hr tablet 3,000 mg (3,000 mg Oral Given 01/20/24 2325)  phenytoin  (DILANTIN ) ER capsule 300 mg (300 mg Oral Given 01/20/24 1952)   thiamine  (VITAMIN B1) tablet 100 mg (100 mg Oral Given 01/20/24 1953)  folic acid  (FOLVITE ) tablet 1 mg (1 mg Oral Given 01/20/24 1954)  heparin  injection 5,000 Units (5,000 Units Subcutaneous Given 01/21/24 0613)  nicotine  (NICODERM CQ  - dosed in mg/24 hours) patch 14 mg (14 mg Transdermal Not Given 01/20/24 2325)  potassium chloride  (KLOR-CON ) packet 40 mEq (has no administration in time range)  levETIRAcetam  (KEPPRA ) undiluted injection 500 mg (500 mg Intravenous Given 01/20/24 1124)  iohexol  (OMNIPAQUE ) 350 MG/ML injection 60 mL (60 mLs Intravenous Contrast Given 01/20/24 1354)    Clinical Course as of 01/21/24 0824  Mon Jan 20, 2024  1300 On reassessment she was alert, awake and oriented x 3 transiently.  As I was talking to her she started to trail off with her speech and it was disordered and nonsensical.  She then was not able to say her name or could not recognize simple objects.  Activated code stroke for the aphasia. [JR]  1415 Discussed patient with Dr. Rosemarie of neurology who advised this is likely related more to her  seizures and advised medical admission for EEG and seizure workup. [JR]  1559 CT angio head and neck and CT Noncon unremarkable [JR]    Clinical Course User Index [JR] Lang Norleen POUR, PA-C                                 Medical Decision Making Amount and/or Complexity of Data Reviewed Labs: ordered. Radiology: ordered.  Risk Decision regarding hospitalization.   Initial Impression and Ddx 43 yo well appearing female presenting for seizure-like activity.  Exam notable for right arm weakness, noncommunicative and flat affect.  Could be postictal.  DDx includes seizure, stroke, electrolyte derangement, sepsis, arrhythmia, other.  Loaded initially with 500 mg IV Keppra . Patient PMH that increases complexity of ED encounter:  history of seizures, SAH, TBI   Interpretation of Diagnostics - I independent reviewed and interpreted the labs as followed: Reduced  phenytoin  level, no other acute derangements  - I independently visualized the following imaging with scope of interpretation limited to determining acute life threatening conditions related to emergency care: CT scans, which revealed nonacute.  See details above  -I personally reviewed and interpreted EKG which revealed sinus rhythm  Patient Reassessment and Ultimate Disposition/Management On reassessment, patient was aphasic saying words that were nonsensical and unable to identify simple objects in the room and could not identify her name or birthday this happened acutely as as I was talking to her she was initially alert and oriented and awake x 3.  Activated code stroke.  Stroke evaluation was overall reassuring.  Discussed with Dr. Rosemarie who feels that this is likely more related to seizures.  Recommended admission and neuro would be following her.  Admitted to internal medicine with Dr. Heddy.  Patient management required discussion with the following services or consulting groups:  Hospitalist Service and Neurology  Complexity of Problems Addressed Acute complicated illness or Injury  Additional Data Reviewed and Analyzed Further history obtained from: Further history from spouse/family member, Past medical history and medications listed in the EMR, and Prior ED visit notes  Patient Encounter Risk Assessment Consideration of hospitalization      Final diagnoses:  Altered mental status, unspecified altered mental status type    ED Discharge Orders     None          Lang Norleen POUR, PA-C 01/21/24 [id number]    Freddi Hamilton, MD 01/23/24 [phone]

## 2024-01-20 NOTE — Progress Notes (Signed)
 NEUROLOGY CONSULT NOTE   Date of service: January 20, 2024 Patient Name: Alyssa Little MRN:  [id number] DOB:  April 16, 1981 Chief Complaint: Code stroke Requesting Provider: Glendia Breeding History of Present Illness  Alyssa Little is a 43 y.o. female with hx of traumatic brain injury, subarachnoid hemorrhage and seizure disorder.  She was brought in by her partner who reported her last known well to be around 3 PM yesterday when he left for work.  When he returned this morning at 2 AM noticed that she appeared to be confused and having some right-sided weakness.  She stated that patient has previously had similar symptoms before her seizures in the past.  Patient was brought to the ER minimally communicative with a flat affect not following commands.  As per her partner she has been taking her phenytoin  as prescribed.  By the time I examined the patient she had no clear physical weakness in the face arm and leg but she kept staring and not following commands not speaking.  LKW: 3 PM yesterday Modified rankin score: 1-No significant post stroke disability and can perform usual duties with stroke symptoms IV Thrombolysis: No clinical exam not suggestive of stroke   IR Thrombectomy no as no LVO      NIHSS components Score: Comment  1a Level of Conscious 0[x]  1[]  2[]  3[]      1b LOC Questions 0[]  1[]  2[x]       1c LOC Commands 0[]  1[]  2[x]       2 Best Gaze 0[x]  1[]  2[]       3 Visual 0[x]  1[]  2[]  3[]      4 Facial Palsy 0[x]  1[]  2[]  3[]      5a Motor Arm - left 0[x]  1[]  2[]  3[]  4[]  UN[]    5b Motor Arm - Right 0[x]  1[]  2[]  3[]  4[]  UN[]    6a Motor Leg - Left 0[x]  1[]  2[]  3[]  4[]  UN[]    6b Motor Leg - Right 0[x]  1[]  2[]  3[]  4[]  UN[]    7 Limb Ataxia 0[x]  1[]  2[]  UN[]      8 Sensory 0[x]  1[]  2[]  UN[]      9 Best Language 0[]  1[]  2[]  3[x]      10 Dysarthria 0[]  1[x]  2[]  UN[]      11 Extinct. and Inattention 0[x]  1[]  2[]       TOTAL: 7      ROS  14 system comprehensive ROS  performed and pertinent positives documented in HPI  And unable to ascertain due to patient's mental status  Past History   Past Medical History:  Diagnosis Date   Alcohol abuse    Hypokalemia 10/19/2022   SAH (subarachnoid hemorrhage) (HCC)    Seizure disorder (HCC)    TBI (traumatic brain injury) (HCC)    Transaminitis     Past Surgical History:  Procedure Laterality Date   CHOLECYSTECTOMY      Family History: Family History  Family history unknown: Yes    Social History  reports that she has been smoking cigarettes. She has been exposed to tobacco smoke. She has never used smokeless tobacco. She reports that she does not currently use alcohol after a past usage of about 1.0 standard drink of alcohol per week. She reports that she does not use drugs.  Allergies  Allergen Reactions   Shellfish Allergy Anaphylaxis   Flagyl  [Metronidazole ] Hives   Penicillins Hives, Rash and Other (See Comments)    Tolerated cephalosporins including ancef   Has patient had a PCN reaction causing immediate rash, facial/tongue/throat swelling, SOB or lightheadedness  with hypotension: Unknown Has patient had a PCN reaction causing severe rash involving mucus membranes or skin necrosis: Unknown Has patient had a PCN reaction that required hospitalization: Unknown Has patient had a PCN reaction occurring within the last 10 years: Unknown If all of the above answers are NO, then may    Medications  No current facility-administered medications for this encounter.  Current Outpatient Medications:    folic acid  (FOLVITE ) 1 MG tablet, Take 1 tablet (1 mg total) by mouth daily., Disp: 30 tablet, Rfl: 2   hydrOXYzine  (ATARAX ) 10 MG tablet, Take 1 tablet (10 mg total) by mouth 3 (three) times daily as needed., Disp: , Rfl:    ibuprofen (ADVIL) 200 MG tablet, Take 200-400 mg by mouth every 6 (six) hours as needed for cramping or headache., Disp: , Rfl:    levETIRAcetam  (KEPPRA  XR) 500 MG 24 hr  tablet, Take 6 tablets (3,000 mg total) by mouth at bedtime., Disp: 180 tablet, Rfl: 3   Multiple Vitamin (MULTIVITAMIN WITH MINERALS) TABS tablet, Take 1 tablet by mouth daily., Disp: 30 tablet, Rfl: 3   PHENYTEK  300 MG ER capsule, Take 1 capsule (300 mg total) by mouth daily., Disp: 90 capsule, Rfl: 5   PHENYTEK  300 MG ER capsule, Take one capsule (300 mg dose) by mouth daily. BRAND NAME MEDICALLY NECESSARY, Disp: 90 capsule, Rfl: 5   propranolol  (INDERAL ) 10 MG tablet, Take 1 tablet (10 mg total) by mouth 2 (two) times daily as needed., Disp: 60 tablet, Rfl: 2   thiamine  (VITAMIN B1) 100 MG tablet, Take 1 tablet (100 mg total) by mouth daily., Disp: 90 tablet, Rfl: 3   TYLENOL  500 MG tablet, Take 500-1,000 mg by mouth every 6 (six) hours as needed for mild pain (or headaches)., Disp: , Rfl:   Vitals   Vitals:   02/10/24 1345 02/10/2024 1400 Feb 10, 2024 1600 Feb 10, 2024 1615  BP: 111/77  115/85 117/89  Pulse:   85 89  Resp:   16 19  Temp:  98.4 F (36.9 C)    TempSrc:  Oral    SpO2:   100% 100%  Weight:      Height:        Body mass index is 18.29 kg/m.   Physical Exam   Constitutional: Appears well-developed and well-nourished.  Pleasant middle-age Caucasian lady Psych: Affect flat Eyes: No scleral injection.   HENT: No OP obstruction.   Head: Normocephalic.   Cardiovascular: Normal rate and regular rhythm.   Respiratory: Effort normal, non-labored breathing.   GI: Soft.  No distension. There is no tenderness.   Skin: WDI.    Neurologic Examination   Patient is awake alert.  Her eyes open but she is nonresponsive.  She blinks to threat bilaterally.  She does not follow any commands.  She will not speak or even attempt to speak.  Face appears symmetric without weakness.  Tongue midline.  Motor system exam no upper or lower extremity drift or any focal weakness.  Withdraws all 4 extremities well to painful stimuli.  Plantars are downgoing.  Gait not  tested.  Labs/Imaging/Neurodiagnostic studies   CBC:  Recent Labs  Lab 10-Feb-2024 1107 02/10/24 1426  WBC 10.1  --   NEUTROABS 6.8  --   HGB 13.5 12.2  HCT 42.3 36.0  MCV 99.1  --   PLT 281  --    Basic Metabolic Panel:  Lab Results  Component Value Date   NA 138 February 10, 2024   K 3.5 Feb 10, 2024  CO2 25 01/20/2024   GLUCOSE 86 01/20/2024   BUN <3 (L) 01/20/2024   CREATININE 0.60 01/20/2024   CALCIUM  9.0 01/20/2024   GFRNONAA >60 01/20/2024   GFRAA >60 04/09/2019   Lipid Panel: No results found for: LDLCALC HgbA1c: No results found for: HGBA1C Urine Drug Screen:     Component Value Date/Time   LABOPIA NONE DETECTED 01/20/2024 1400   COCAINSCRNUR NONE DETECTED 01/20/2024 1400   LABBENZ NONE DETECTED 01/20/2024 1400   AMPHETMU NONE DETECTED 01/20/2024 1400   THCU NONE DETECTED 01/20/2024 1400   LABBARB NONE DETECTED 01/20/2024 1400    Alcohol Level     Component Value Date/Time   [facility] <15 01/20/2024 1107   INR  Lab Results  Component Value Date   INR 1.1 01/20/2024   APTT  Lab Results  Component Value Date   APTT 33 01/20/2024   AED levels:  Lab Results  Component Value Date   PHENYTOIN  8.5 (L) 01/20/2024   LEVETIRACETA 40.8 (H) 04/12/2023    CT Head without contrast(Personally reviewed): No acute abnormality  CT angio Head and Neck with contrast(Personally reviewed): No LVO     Neurodiagnostics rEEG:  Pending  ASSESSMENT   Wanita Derenzo is a 43 y.o. female with past history of traumatic brain injury and subarachnoid hemorrhage with seizure disorder presents with sudden onset of altered mental status with questionable right-sided weakness which appears to have resolved.  According to her partner this is her similar presentation after she has a seizure.  It is feasible that she had an unwitnessed seizure and is currently in a postictal state.  CT angiogram of the brain and CT head are both unremarkable and this is unlikely to be a  seizure.  RECOMMENDATIONS  Admit patient to the medical team.  Check EEG for seizure activity.  Check phenytoin  and Keppra  levels and optimize.  Continue home doses of both medication for now.  Seizure precautions.  Neurohospitalist team to follow and consults.  Discussed with Dr. Freddi ER MD.  Burna call for questions. ______________________________________________________________________   I personally spent a total of 55 minutes in the care of the patient today including getting/reviewing separately obtained history, performing a medically appropriate exam/evaluation, counseling and educating, placing orders, referring and communicating with other health care professionals, documenting clinical information in the EHR, independently interpreting results, and coordinating care.          Signed, Eather Popp, MD [facility]

## 2024-01-20 NOTE — Code Documentation (Signed)
 Stroke Response Nurse Documentation Code Documentation  Alyssa Little is a 43 y.o. female arriving to [place]  via Private Vehicle on 01-20-24 with past medical hx of seizure, TBI, SAH,Anxiety. On No antithrombotic. Code stroke was activated by ED.   Patient from Home where she was LKW at 1500 yesterday and now complaining of being confused.  Per husband she was normal when he went to work yesterday.  When he arrived home last night she seemed like she does after she has a seizure  Since she was still confused today he brought her to the hospital.  Stroke team at the bedside on patient arrival. Labs drawn and patient cleared for CT by EDP. Patient to CT with team. NIHSS 6, see documentation for details and code stroke times. Patient with disoriented, not following commands, and Global aphasia  on exam. The following imaging was completed:  CT Head and CTA. Patient is not a candidate for IV Thrombolytic due to LKW being outside TNK window. Patient is not a candidate for IR due to no LVO on CTA.   Care Plan: VS & NIHSS q 2 hours x 12 hours then q 4 hours.   Bedside handoff with ED RN Lilly.    Elvin Portland  Stroke Response RN

## 2024-01-20 NOTE — Procedures (Signed)
 Patient Name: Alyssa Little  MRN: [id number]  Epilepsy Attending: Arlin MALVA Krebs  Referring Physician/Provider: Heddy Barren, DO  Date: 01/20/2024 Duration: 23.33 mins  Patient history:  43 y.o. female with hx of traumatic brain injury, subarachnoid hemorrhage and seizure disorder.  She was brought in by her partner who reported that she appeared to be confused and having some right-sided weakness. EEG to evaluate for seizure  Level of alertness: Awake  AEDs during EEG study: LEV, PHT  Technical aspects: This EEG study was done with scalp electrodes positioned according to the 10-20 International system of electrode placement. Electrical activity was reviewed with band pass filter of 1-70Hz , sensitivity of 7 uV/mm, display speed of 64mm/sec with a 60Hz  notched filter applied as appropriate. EEG data were recorded continuously and digitally stored.  Video monitoring was available and reviewed as appropriate.  Description:  The posterior dominant rhythm consists of 9 Hz activity of moderate voltage (25-35 uV) seen predominantly in posterior head regions, asymmetric ( left<right) and reactive to eye opening and eye closing. EEG showed continuous 3 to 5 Hz theta-delta slowing in left hemisphere, maximal left temporal region. Hyperventilation and photic stimulation were not performed.    ABNORMALITY - Continuous slow, left hemisphere, maximal left temporal region   IMPRESSION: This technically difficult study is suggestive of cortical dysfunction arising from left hemisphere, maximal left temporal region likely secondary to underlying structural abnormality, post-ictal state. No seizures or epileptiform discharges were seen throughout the recording.   Marquice Uddin O Spence Soberano

## 2024-01-21 ENCOUNTER — Inpatient Hospital Stay (HOSPITAL_COMMUNITY)

## 2024-01-21 DIAGNOSIS — G8384 Todd's paralysis (postepileptic): Secondary | ICD-10-CM | POA: Diagnosis not present

## 2024-01-21 DIAGNOSIS — G40909 Epilepsy, unspecified, not intractable, without status epilepticus: Secondary | ICD-10-CM | POA: Diagnosis not present

## 2024-01-21 DIAGNOSIS — Z87820 Personal history of traumatic brain injury: Secondary | ICD-10-CM | POA: Diagnosis not present

## 2024-01-21 LAB — BASIC METABOLIC PANEL WITH GFR
Anion gap: 16 — ABNORMAL HIGH (ref 5–15)
BUN: 6 mg/dL (ref 6–20)
CO2: 19 mmol/L — ABNORMAL LOW (ref 22–32)
Calcium: 8.8 mg/dL — ABNORMAL LOW (ref 8.9–10.3)
Chloride: 105 mmol/L (ref 98–111)
Creatinine, Ser: 0.71 mg/dL (ref 0.44–1.00)
GFR, Estimated: >60 mL/min (ref >60)
Glucose, Bld: 78 mg/dL (ref 70–99)
Potassium: 3.4 mmol/L — ABNORMAL LOW (ref 3.5–5.1)
Sodium: 140 mmol/L (ref 135–145)

## 2024-01-21 LAB — URINALYSIS, ROUTINE W REFLEX MICROSCOPIC
Bilirubin Urine: NEGATIVE
Glucose, UA: NEGATIVE mg/dL
Ketones, ur: 80 mg/dL — AB
Leukocytes,Ua: NEGATIVE
Nitrite: NEGATIVE
Protein, ur: 100 mg/dL — AB
Specific Gravity, Urine: 1.03 (ref 1.005–1.030)
pH: 5 (ref 5.0–8.0)

## 2024-01-21 LAB — CBC
HCT: 39.2 % (ref 36.0–46.0)
Hemoglobin: 12.8 g/dL (ref 12.0–15.0)
MCH: 32.2 pg (ref 26.0–34.0)
MCHC: 32.7 g/dL (ref 30.0–36.0)
MCV: 98.7 fL (ref 80.0–100.0)
Platelets: 305 K/uL (ref 150–400)
RBC: 3.97 MIL/uL (ref 3.87–5.11)
RDW: 12.5 % (ref 11.5–15.5)
WBC: 13.1 K/uL — ABNORMAL HIGH (ref 4.0–10.5)
nRBC: 0 % (ref 0.0–0.2)

## 2024-01-21 LAB — LEVETIRACETAM LEVEL: Levetiracetam Lvl: 53.4 ug/mL — ABNORMAL HIGH (ref 10.0–40.0)

## 2024-01-21 LAB — SALICYLATE LEVEL: Salicylate Lvl: <7 mg/dL — ABNORMAL LOW (ref 7.0–30.0)

## 2024-01-21 LAB — TSH: TSH: 1.706 u[IU]/mL (ref 0.350–4.500)

## 2024-01-21 LAB — ACETAMINOPHEN LEVEL: Acetaminophen (Tylenol), Serum: <10 ug/mL — ABNORMAL LOW (ref 10–30)

## 2024-01-21 LAB — CK: Total CK: 52 U/L (ref 38–234)

## 2024-01-21 MED ORDER — POTASSIUM CHLORIDE 20 MEQ PO PACK
40.0000 meq | PACK | Freq: Once | ORAL | Status: AC
  Administered 2024-01-21: 40 meq via ORAL
  Filled 2024-01-21: qty 2

## 2024-01-21 MED ORDER — PHENYTOIN SODIUM EXTENDED 30 MG PO CAPS: 350.0000 mg | ORAL_CAPSULE | Freq: Every day | ORAL | Status: DC

## 2024-01-21 MED ORDER — PHENYTOIN SODIUM EXTENDED 100 MG PO CAPS
300.0000 mg | ORAL_CAPSULE | Freq: Every day | ORAL | Status: DC
  Administered 2024-01-22: 300 mg via ORAL
  Filled 2024-01-21: qty 3

## 2024-01-21 MED ORDER — PROPRANOLOL HCL 10 MG PO TABS
10.0000 mg | ORAL_TABLET | Freq: Once | ORAL | Status: AC
  Administered 2024-01-21: 10 mg via ORAL
  Filled 2024-01-21: qty 1

## 2024-01-21 NOTE — Progress Notes (Cosign Needed Addendum)
 HD#1 SUBJECTIVE:  Patient Summary: Alyssa Little is a 43 y.o. with a pertinent PMH of grand mal seizures, SAH and TBI (2019), anxiety, PTSD, who presented with encephalopathy and admitted for seizure workup.   Overnight Events: No overnight events  Interim History: Patient assessed bedside.  She is oriented to year and herself but it is hard for her to express it; said is either misunderstanding or not oriented to location (when asked she stated her partner's house but then corrected saying she thought I asked where she lived.)  She could accurately explain how she ended up in the hospital that was cooperated by her partners explanation of events. She stated she is here due to seizure activity that occurred early in afternoon on Sunday. She reports taking her medications, however remembered having difficulty swallowing pills during her feeling off episode.  She described that her partner stated that this seizure episode was different from her previous as she was ignoring, not speaking, and not moving her right hand.  She has had emotional distress due to her younger brother dying.  Endorses still dropping items held in her right hand. Additionally she said that the room that she is in in the emergency department is very loud with the patient's next-door in the computers/alarms.  OBJECTIVE:  Vital Signs: Vitals:   01/21/24 0715 01/21/24 0720 01/21/24 0730 01/21/24 1225  BP:   126/80 (!) 125/94  Pulse: (!) 105 (!) 112  (!) 110  Resp: 13 (!) 25 16 18   Temp:   98.7 F (37.1 C) 98.6 F (37 C)  TempSrc:   Oral Oral  SpO2: 97% 98% 96% 98%  Weight:      Height:       Supplemental O2: Room Air SpO2: 98 %  Filed Weights   01/20/24 1025  Weight: 45.4 kg    No intake or output data in the 24 hours ending 01/21/24 1426 Net IO Since Admission: No IO data has been entered for this period [01/21/24 1426]  Physical Exam: Physical Exam Constitutional:      General: She is not in  acute distress.    Appearance: She is not ill-appearing or toxic-appearing.  HENT:     Head:     Comments: Pink coloration to cheeks and lower face with acne like bumps.    Mouth/Throat:     Comments: Poor dentition and likely gum disease. Eyes:     Extraocular Movements: Extraocular movements intact.     Pupils: Pupils are equal, round, and reactive to light.  Cardiovascular:     Rate and Rhythm: Regular rhythm. Tachycardia present.     Heart sounds: No murmur heard.    No friction rub. No gallop.  Pulmonary:     Effort: Pulmonary effort is normal.     Breath sounds: Normal breath sounds. No stridor. No wheezing, rhonchi or rales.  Abdominal:     General: Abdomen is flat.     Palpations: Abdomen is soft.     Tenderness: There is no abdominal tenderness. There is no guarding.  Musculoskeletal:     Right lower leg: No edema.     Left lower leg: No edema.  Skin:    General: Skin is warm and dry.  Neurological:     Mental Status: She is alert.     Cranial Nerves: No cranial nerve deficit.     Motor: No weakness.     Comments: Oriented to time and situation.  Difficulty expressing name, but also oriented  to self. Cranial nerve exam grossly intact. Endorsed decreased sensation to right face, right upper extremity, right lower extremity compared to the left. Motor 5/5 in upper and lower extremities bilaterally. Difficulty following all instructions such as squeeze my finger  Psychiatric:        Mood and Affect: Mood normal.        Behavior: Behavior normal.     Patient Lines/Drains/Airways Status     Active Line/Drains/Airways     Name Placement date Placement time Site Days   Peripheral IV 01/20/24 20 G Right Antecubital 01/20/24  1114  Antecubital  1            Pertinent labs and imaging:      Latest Ref Rng & Units 01/21/2024    4:49 AM 01/20/2024    2:26 PM 01/20/2024   11:07 AM  CBC  WBC 4.0 - 10.5 K/uL 13.1   10.1   Hemoglobin 12.0 - 15.0 g/dL 87.1  87.7   86.4   Hematocrit 36.0 - 46.0 % 39.2  36.0  42.3   Platelets 150 - 400 K/uL 305   281        Latest Ref Rng & Units 01/21/2024    4:49 AM 01/20/2024    2:26 PM 01/20/2024   11:07 AM  CMP  Glucose 70 - 99 mg/dL 78  86  93   BUN 6 - 20 mg/dL 6  <3  <5   Creatinine 0.44 - 1.00 mg/dL 9.28  9.39  9.36   Sodium 135 - 145 mmol/L 140  138  137   Potassium 3.5 - 5.1 mmol/L 3.4  3.5  3.8   Chloride 98 - 111 mmol/L 105  101  104   CO2 22 - 32 mmol/L 19   25   Calcium  8.9 - 10.3 mg/dL 8.8   9.0   Total Protein 6.5 - 8.1 g/dL   7.0   Total Bilirubin 0.0 - 1.2 mg/dL   0.8   Alkaline Phos 38 - 126 U/L   100   AST 15 - 41 U/L   34   ALT 0 - 44 U/L   57     EEG adult Result Date: 01/20/2024 Shelton Arlin KIDD, MD     01/20/2024  9:58 PM Patient Name: Alyssa Little MRN: [id number] Epilepsy Attending: Arlin KIDD Shelton Referring Physician/Provider: Heddy Barren, DO Date: 01/20/2024 Duration: 23.33 mins Patient history:  43 y.o. female with hx of traumatic brain injury, subarachnoid hemorrhage and seizure disorder.  She was brought in by her partner who reported that she appeared to be confused and having some right-sided weakness. EEG to evaluate for seizure Level of alertness: Awake AEDs during EEG study: LEV, PHT Technical aspects: This EEG study was done with scalp electrodes positioned according to the 10-20 International system of electrode placement. Electrical activity was reviewed with band pass filter of 1-70Hz , sensitivity of 7 uV/mm, display speed of 23mm/sec with a 60Hz  notched filter applied as appropriate. EEG data were recorded continuously and digitally stored.  Video monitoring was available and reviewed as appropriate. Description:  The posterior dominant rhythm consists of 9 Hz activity of moderate voltage (25-35 uV) seen predominantly in posterior head regions, asymmetric ( left<right) and reactive to eye opening and eye closing. EEG showed continuous 3 to 5 Hz theta-delta slowing  in left hemisphere, maximal left temporal region. Hyperventilation and photic stimulation were not performed.  ABNORMALITY - Continuous slow, left hemisphere, maximal left temporal region  IMPRESSION:  This technically difficult study is suggestive of cortical dysfunction arising from left hemisphere, maximal left temporal region likely secondary to underlying structural abnormality, post-ictal state. No seizures or epileptiform discharges were seen throughout the recording.  Arlin MALVA Krebs    ASSESSMENT/PLAN:  Assessment: Principal Problem:   Receptive aphasia Active Problems:   Seizures (HCC)   TBI (traumatic brain injury) (HCC)  Plan:  Encephalopathy  Expressive aphasia History of seizure History of postictal psychosis Encephalomalacia Hematuria EEG: Suggestive of cortical dysfunction arising from the left hemisphere, maximal left temporal region likely secondary to underlying structural abnormality, post-ictal state.  No seizures or epileptiform discharges were seen throughout the recording. Background: TIA and SAH reported by patient to have occurred after hitting the floor.  Also has history of attack in early 20s from walking that resulted in hitting her head. Labs: TSH 1.7, salicylate <7, Tylenol  <10. Other labs unremarkable for cause of encephalopathy. Physical exam: On assessment, patient overall much improved.  She is able to and see her questions accurately with very minimal expressive aphasia.  Neuroexam grossly normal.  Right-sided weakness does not seem apparent, however she states that she is still dropping items in her right hand.  She endorses a sensation deficit to the right side of her face, upper extremity, lower extremity. Assessment: Continue to monitor 1 more day with hopeful full improvement of symptoms. -Continue phenytoin  300 mg daily and Keppra  3 g daily -Seizure precautions  Metabolic acidosis Leukocytosis Hematuria UA positive for large hemoglobin and  dipstick, 80 ketones, 100 protein, rare bacteria.  Hematuria and ketones likely 2/2 seizure. Anion gap 16, CK 52, bicarb 18.  WBC 13.1.  Afebrile.  Likely 2/2 postseizure. - Monitor  Tachycardia Heart rate 100+. Patient takes propranolol  as needed 10 mg for anxiety. - Propranolol  10 mg ordered  Hypokalemia Potassium 3.4 -Repleted  Best Practice: Diet: Regular diet VTE: heparin  injection 5,000 Units Start: 01/20/24 2200 Code: Full  Disposition planning: Therapy Recs: Pending, DME: none Family Contact: Maude Maier, to be notified. DISPO: Anticipated discharge tomorrow to Home pending improvement.  Signature:  Sallyanne Benuel [facility]  2:26 PM, 01/21/2024  On Call pager [phone]

## 2024-01-21 NOTE — ED Notes (Addendum)
 Urine sent. PT ambulated with an even and steady gate to the rest room. PT stand by to the rest room.

## 2024-01-21 NOTE — Progress Notes (Signed)
 NEUROLOGY CONSULT FOLLOW UP NOTE   Date of service: January 21, 2024 Patient Name: Alyssa Little MRN:  [id number] DOB:  11/11/1980  Interval Hx/subjective  Patient has remained hemodynamically stable and afebrile overnight.  EEG demonstrates no seizure activity, and patient feels as though she is slowly coming back to her baseline mentally. Vitals   Vitals:   01/21/24 1500 01/21/24 1511 01/21/24 1515 01/21/24 1530  BP: 119/83  121/85 107/76  Pulse:  93    Resp:  20    Temp:      TempSrc:      SpO2:  97%    Weight:      Height:         Body mass index is 18.29 kg/m.  Physical Exam   Constitutional: Appears well-developed and well-nourished.  Psych: Affect appropriate to situation.  Eyes: No scleral injection.  HENT: No OP obstrucion.  Head: Normocephalic.  Respiratory: Effort normal, non-labored breathing.  Skin: WDI.   Neurologic Examination    NEURO:  Mental Status: Alert and oriented to person and place, oriented to month but gives incorrect date, able to do simple addition but not subtraction. Speech/Language: speech is without dysarthria or aphasia.    Cranial Nerves:  II: PERRL. Visual fields full.  III, IV, VI: EOMI. Eyelids elevate symmetrically.  V: Sensation is intact to light touch and symmetrical to face.  VII: Smile is symmetrical. VIII: hearing intact to voice. IX, X: Phonation is normal.  KP:Dynloizm shrug 5/5. XII: tongue is midline without fasciculations. Motor: 5/5 strength to all muscle groups tested.  Some incoordination of right hand when trying to grasp objects Tone: is normal and bulk is normal Sensation- Intact to light touch bilaterally.   Coordination: FTN intact bilaterally Gait- deferred     Medications  Current Facility-Administered Medications:    acetaminophen  (TYLENOL ) tablet 650 mg, 650 mg, Oral, Q6H PRN **OR** acetaminophen  (TYLENOL ) suppository 650 mg, 650 mg, Rectal, Q6H PRN, Tawkaliyar, Roya, DO   folic acid   (FOLVITE ) tablet 1 mg, 1 mg, Oral, Daily, Tawkaliyar, Roya, DO, 1 mg at 01/21/24 0924   heparin  injection 5,000 Units, 5,000 Units, Subcutaneous, Q8H, Tawkaliyar, Roya, DO, 5,000 Units at 01/21/24 1537   levETIRAcetam  (KEPPRA  XR) 24 hr tablet 3,000 mg, 3,000 mg, Oral, QHS, Tawkaliyar, Roya, DO, 3,000 mg at 01/20/24 2325   nicotine  (NICODERM CQ  - dosed in mg/24 hours) patch 14 mg, 14 mg, Transdermal, Daily, Tawkaliyar, Roya, DO   phenytoin  (DILANTIN ) ER capsule 300 mg, 300 mg, Oral, Daily, Tawkaliyar, Roya, DO, 300 mg at 01/21/24 0924   polyethylene glycol (MIRALAX / GLYCOLAX) packet 17 g, 17 g, Oral, Daily PRN, Tawkaliyar, Roya, DO   thiamine  (VITAMIN B1) tablet 100 mg, 100 mg, Oral, Daily, Tawkaliyar, Roya, DO, 100 mg at 01/21/24 9075  Current Outpatient Medications:    folic acid  (FOLVITE ) 1 MG tablet, Take 1 tablet (1 mg total) by mouth daily., Disp: 30 tablet, Rfl: 2   hydrOXYzine  (ATARAX ) 10 MG tablet, Take 1 tablet (10 mg total) by mouth 3 (three) times daily as needed. (Patient taking differently: Take 10 mg by mouth 3 (three) times daily as needed for itching, anxiety, nausea or vomiting.), Disp: , Rfl:    ibuprofen (ADVIL) 200 MG tablet, Take 200-400 mg by mouth every 6 (six) hours as needed for cramping or headache., Disp: , Rfl:    levETIRAcetam  (KEPPRA  XR) 500 MG 24 hr tablet, Take 6 tablets (3,000 mg total) by mouth at bedtime., Disp: 180 tablet, Rfl: 3  Multiple Vitamin (MULTIVITAMIN WITH MINERALS) TABS tablet, Take 1 tablet by mouth daily., Disp: 30 tablet, Rfl: 3   PHENYTEK  300 MG ER capsule, Take one capsule (300 mg dose) by mouth daily. BRAND NAME MEDICALLY NECESSARY, Disp: 90 capsule, Rfl: 5   propranolol  (INDERAL ) 10 MG tablet, Take 1 tablet (10 mg total) by mouth 2 (two) times daily as needed. (Patient taking differently: Take 10 mg by mouth 2 (two) times daily as needed (anxiety).), Disp: 60 tablet, Rfl: 2   thiamine  (VITAMIN B1) 100 MG tablet, Take 1 tablet (100 mg total) by  mouth daily., Disp: 90 tablet, Rfl: 3   TYLENOL  500 MG tablet, Take 500-1,000 mg by mouth every 6 (six) hours as needed for mild pain (or headaches)., Disp: , Rfl:   Labs and Diagnostic Imaging   CBC:  Recent Labs  Lab 01/20/24 1107 01/20/24 1426 01/21/24 0449  WBC 10.1  --  13.1*  NEUTROABS 6.8  --   --   HGB 13.5 12.2 12.8  HCT 42.3 36.0 39.2  MCV 99.1  --  98.7  PLT 281  --  305    Basic Metabolic Panel:  Lab Results  Component Value Date   NA 140 01/21/2024   K 3.4 (L) 01/21/2024   CO2 19 (L) 01/21/2024   GLUCOSE 78 01/21/2024   BUN 6 01/21/2024   CREATININE 0.71 01/21/2024   CALCIUM  8.8 (L) 01/21/2024   GFRNONAA >60 01/21/2024   GFRAA >60 04/09/2019   Lipid Panel: No results found for: LDLCALC HgbA1c: No results found for: HGBA1C Urine Drug Screen:     Component Value Date/Time   LABOPIA NONE DETECTED 01/20/2024 1400   COCAINSCRNUR NONE DETECTED 01/20/2024 1400   LABBENZ NONE DETECTED 01/20/2024 1400   AMPHETMU NONE DETECTED 01/20/2024 1400   THCU NONE DETECTED 01/20/2024 1400   LABBARB NONE DETECTED 01/20/2024 1400    Alcohol Level     Component Value Date/Time   ETH <15 01/20/2024 1107   INR  Lab Results  Component Value Date   INR 1.1 01/20/2024   APTT  Lab Results  Component Value Date   APTT 33 01/20/2024   AED levels:  Lab Results  Component Value Date   PHENYTOIN  8.5 (L) 01/20/2024   LEVETIRACETA 53.4 (H) 01/20/2024    CT Head without contrast(Personally reviewed): No acute abnormality  CT angio Head and Neck with contrast(Personally reviewed): No LVO, aneurysm or hemodynamically significant stenosis  MRI Brain(Personally reviewed): Pending  rEEG:  Continuous slow, left hemisphere, maximal left temporal region This technically difficult study is suggestive of cortical dysfunction arising from left hemisphere, maximal left temporal region likely secondary to underlying structural abnormality, post-ictal state. No seizures or  epileptiform discharges were seen throughout the recording.  Assessment   Alyssa Little is a 43 y.o. female with history of TBI, subarachnoid hemorrhage and seizure disorder who presented with confusion, aphasia and right-sided weakness.  Patient's partner had left for work, and when he returned found her with altered mental status and brought her to the emergency department.  Patient reports that she often has no memory of her seizure activity and typically wakes up in the emergency department.  She states she has not missed any doses of her medications and has had no other recent symptoms.  On arrival to the ED, she was nonverbal but improved overnight to be able to communicate verbally with some confusion.  She currently feels as though she is slowly mentally coming back to her baseline but  does not quite there yet.  She reports that some residual right hand incoordination.  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  Dilantin  level was noted to be low at 8.5, will obtain pharmacy consult for advice on dose increase  Recommendations  -MRI brain - Continue Keppra  3000 mg nightly - Increase Dilantin  dosing per pharmacist - Continue seizure precautions - Encourage close follow-up with outpatient epileptologist - Neurology will continue to follow ______________________________________________________________________ Patient seen by NP and discussed with MD, MD to edit note as needed.  Signed, Zayden Hahne E Everitt Clint Kill, NP [facility]

## 2024-01-21 NOTE — ED Notes (Signed)
 PT sitting on side of bed eating breakfast at this time.  Breathing is even and unlabored.

## 2024-01-21 NOTE — ED Notes (Signed)
 Expressive aphasia obvious with some possible confusion intermittently. Some responses erroneous. Confusion versus wrong word choice. Follows some commands. Tries to do all commands at once, or older commands, while saying inappropriate words. Alert, NAD, calm. VSS. No sz activity noted.

## 2024-01-22 LAB — CBC
HCT: 38.2 % (ref 36.0–46.0)
Hemoglobin: 12.7 g/dL (ref 12.0–15.0)
MCH: 32.4 pg (ref 26.0–34.0)
MCHC: 33.2 g/dL (ref 30.0–36.0)
MCV: 97.4 fL (ref 80.0–100.0)
Platelets: 277 K/uL (ref 150–400)
RBC: 3.92 MIL/uL (ref 3.87–5.11)
RDW: 12.4 % (ref 11.5–15.5)
WBC: 8.7 K/uL (ref 4.0–10.5)
nRBC: 0 % (ref 0.0–0.2)

## 2024-01-22 LAB — BASIC METABOLIC PANEL WITH GFR
Anion gap: 14 (ref 5–15)
BUN: <5 mg/dL — ABNORMAL LOW (ref 6–20)
CO2: 20 mmol/L — ABNORMAL LOW (ref 22–32)
Calcium: 8.8 mg/dL — ABNORMAL LOW (ref 8.9–10.3)
Chloride: 104 mmol/L (ref 98–111)
Creatinine, Ser: 0.6 mg/dL (ref 0.44–1.00)
GFR, Estimated: >60 mL/min (ref >60)
Glucose, Bld: 108 mg/dL — ABNORMAL HIGH (ref 70–99)
Potassium: 3.4 mmol/L — ABNORMAL LOW (ref 3.5–5.1)
Sodium: 138 mmol/L (ref 135–145)

## 2024-01-22 MED ORDER — POTASSIUM CHLORIDE 20 MEQ PO PACK
40.0000 meq | PACK | Freq: Once | ORAL | Status: AC
  Administered 2024-01-22: 40 meq via ORAL
  Filled 2024-01-22: qty 2

## 2024-01-22 NOTE — Discharge Instructions (Signed)
 Thank you for allowing us  to be part of your care. You were hospitalized for seizure. We treated you with seizure medication and monitoring.  Neurology has signed off for your discharge.  The MRI 01/21/2024 did not show any new concerning process occurring.  You have made great improvement from your admission.  If you continue having issues with your right hand, follow-up with your primary care doctor and neurologist.  See the changes in your medications and management of your chronic conditions below:  Recommendations per neurology  - Continue Keppra  3000 mg XR nightly - Continue dilantin  ER 300mg  daily --Check dilantin  level in 2 weeks with PCP or neurologist - Continue seizure precautions  NO DRIVING for 6 months after last seizure (per [place] law) PLEASE HAVE close follow-up with your established outpatient neurologist Please follow up with your psychiatrist to speak about the possible auditory hallucinations.  Please Follow up with our clinic. You should be contacted soon regarding date and time.   Please call your PCP or our clinic if you have any questions or concerns, we may be able to help and keep you from a long and expensive emergency room wait. Our clinic and after hours phone number is [phone]. The best time to call is Monday through Friday 9 am to 4 pm but there is always someone available 24/7 if you have an emergency. If you need medication refills please notify your pharmacy one week in advance and they will send us  a request.   We are glad you are feeling better,  Sallyanne Primas Internal Medicine Inpatient Teaching Service at [facility]

## 2024-01-22 NOTE — Discharge Summary (Addendum)
 Name: Alyssa Little MRN: [id number] DOB: 1980/08/10 43 y.o. PCP: Waymond Cart, MD  Date of Admission: 01/20/2024 10:08 AM Date of Discharge: 01/22/2024 Attending Physician: Dr. Mliss Pouch  Discharge Diagnosis: 1. Principal Problem:   Post-ictal state [facility]) Active Problems:   Seizure disorder as sequela of cerebrovascular accident [facility])   History of traumatic brain injury   History of subarachnoid hemorrhage   Todd's paralysis (postepileptic) (HCC)   Discharge Medications: Allergies as of 01/22/2024       Reactions   Shellfish Allergy Anaphylaxis   Flagyl  [metronidazole ] Hives   Penicillins Hives, Rash, Other (See Comments)   Tolerated cephalosporins including ancef  Has patient had a PCN reaction causing immediate rash, facial/tongue/throat swelling, SOB or lightheadedness with hypotension: Unknown Has patient had a PCN reaction causing severe rash involving mucus membranes or skin necrosis: Unknown Has patient had a PCN reaction that required hospitalization: Unknown Has patient had a PCN reaction occurring within the last 10 years: Unknown If all of the above answers are NO, then may        Medication List     TAKE these medications    CertaVite/Antioxidants Tabs Take 1 tablet by mouth daily.   folic acid  1 MG tablet Commonly known as: FOLVITE  Take 1 tablet (1 mg total) by mouth daily.   hydrOXYzine  10 MG tablet Commonly known as: ATARAX  Take 1 tablet (10 mg total) by mouth 3 (three) times daily as needed. What changed: reasons to take this   ibuprofen 200 MG tablet Commonly known as: ADVIL Take 200-400 mg by mouth every 6 (six) hours as needed for cramping or headache.   levETIRAcetam  500 MG 24 hr tablet Commonly known as: KEPPRA  XR Take 6 tablets (3,000 mg total) by mouth at bedtime.   Phenytek  300 MG ER capsule Generic drug: phenytoin  Take one capsule (300 mg dose) by mouth daily. BRAND NAME MEDICALLY NECESSARY   propranolol  10 MG  tablet Commonly known as: INDERAL  Take 1 tablet (10 mg total) by mouth 2 (two) times daily as needed. What changed: reasons to take this   thiamine  100 MG tablet Commonly known as: VITAMIN B1 Take 1 tablet (100 mg total) by mouth daily.   TYLENOL  500 MG tablet Generic drug: acetaminophen  Take 500-1,000 mg by mouth every 6 (six) hours as needed for mild pain (or headaches).        Disposition and follow-up:   [name] was discharged from [facility] in Good condition.  At the hospital follow up visit please address:  1.    Seizure + post-ictal state: F/u Todd's paralysis of right hand. Expressive aphasia (mostly resolved at discharge). Instructed to take home keppra  and phenytoin  dose, no changes made.   Auditory hallucinations (possible): please follow up regarding possible hallucinations. They were non-threatening/no SH/SI  2.  Labs / imaging needed at time of follow-up: dilantin  level in 2 weeks from dc, BMP  3.  Pending labs/ test needing follow-up: n/a  Follow-up Appointments: Neurology  Psychiatry  PCP   Hospital Course by problem list: Alyssa Little is a 43 y.o. person living with a history of grand mal seizures, SAH, TBI, anxiety, and PTSD who presented with encephalopathy and admitted for seizure workup now being discharged on hospital day 2 with the following pertinent hospital course:  Encephalopathy  Expressive aphasia History of seizure History of postictal psychosis Encephalomalacia Background: TIA and SAH reported by patient to have occurred after hitting the floor.  Also has history  of attack in early 20s from walking that resulted in hitting her head. Patient presented with disorientation and right arm weakness that started at 3 AM on 01/20/2024, last known normal at 3 PM on 01/19/2024. No bowel or bladder incontinence, no tongue biting. On initial exam, patient does not answer all questions, is not oriented x 4,  attempts to speak in few words that are nonsensical, does not answer questions with appropriate answers. She has a very flat affect. She initially moved her right upper extremity then held it at a elbow flexion position for several minutes, even when asked to move. Most recent neurology visit on 10/21/2023. Her first recorded seizure 05/2016 with symptoms of LOC and incoherent speech. Denied any acute pain, dysuria. Most recent behavioral health visit on 08/01/2023 noted postictal psychosis during her hospitalization in 03/2023. Patient significant other did report major life stressor, patient's brother passed away on Feb 12, 2024, it was her younger brother who she raised.  Imaging:  CT head: showed bifrontal encephalomalacia. CTA head/neck: negative for LVO or stenosis  EEG: Suggestive of cortical dysfunction arising from the left hemisphere, maximal left temporal region likely secondary to underlying structural abnormality, post-ictal state.  No seizures or epileptiform discharges were seen throughout the recording. MRI brain: no acute process  Labs: Phenytoin  level 8.5, levetiracetam  level 53.4, ALT 57, CBC unremarkable, hCG negative, ethanol <15, drug screen negative, TSH 1.7, salicylate <7, Tylenol  <10.  Physical exam: -had mental and physical improvement each day. On day of discharge was Aox4 and strength intact bilaterally, however patient was still endorsing weakness on right grip. Expressive aphasia greatly improved.   Medications given: received and discharged with Keppra  3 g and dilantin  ER 300 mg daily.   Possible auditory hallucinations  On last day of admission patient endorsed possible auditory hallucinations from TV and people talking outside. The voices were non-threatening and she denied any voices encouraging SH or SI. Follows with psychiatry.   Hypokalemia Repleted    Subjective Seen bedside.  Patient stated she is feeling better, having improvement in communication. She is almost back  to normal state other than possibly hearing things which she thinks is more related to the atmosphere of the ED, but is unsure if she is having auditory hallucinations. The noises/voices are not threatening. She stated one instance was hearing [employer] being bought out on the TV and hearing nurses outside speaking. She stated she is still having grasping difficulty in right hand.   Discharge Exam:   BP (!) 135/94   Pulse 89   Temp 98.4 F (36.9 C) (Oral)   Resp 15   Ht 5' 2 (1.575 m)   Wt 45.4 kg   SpO2 97%   BMI 18.29 kg/m   Discharge exam:  Physical Exam HENT:     Mouth/Throat:     Comments: Black line visible around gums Eyes:     Extraocular Movements: Extraocular movements intact.     Pupils: Pupils are equal, round, and reactive to light.  Cardiovascular:     Rate and Rhythm: Normal rate and regular rhythm.     Heart sounds: Normal heart sounds. No murmur heard.    No friction rub. No gallop.  Pulmonary:     Effort: Pulmonary effort is normal.     Breath sounds: Normal breath sounds. No stridor. No wheezing, rhonchi or rales.  Abdominal:     General: Abdomen is flat.     Palpations: Abdomen is soft.     Tenderness: There is no abdominal  tenderness. There is no guarding.  Musculoskeletal:     Right lower leg: No edema.     Left lower leg: No edema.  Skin:    General: Skin is warm and dry.  Neurological:     General: No focal deficit present.     Mental Status: She is alert.     Sensory: No sensory deficit.     Motor: No weakness.     Comments: Grip strength 5/5 bilaterally. Denied visual or tactile hallucination. Denied current auditory hallucinations (unknown if previous auditory hallucinations)   Psychiatric:        Mood and Affect: Mood normal.        Behavior: Behavior normal.      Pertinent Labs, Studies, and Procedures:     Latest Ref Rng & Units 01/22/2024    3:37 AM 01/21/2024    4:49 AM 01/20/2024    2:26 PM  CBC  WBC 4.0 - 10.5 K/uL 8.7  13.1     Hemoglobin 12.0 - 15.0 g/dL 87.2  87.1  87.7   Hematocrit 36.0 - 46.0 % 38.2  39.2  36.0   Platelets 150 - 400 K/uL 277  305         Latest Ref Rng & Units 01/22/2024    3:37 AM 01/21/2024    4:49 AM 01/20/2024    2:26 PM  CMP  Glucose 70 - 99 mg/dL 891  78  86   BUN 6 - 20 mg/dL <5  6  <3   Creatinine 0.44 - 1.00 mg/dL 9.39  9.28  9.39   Sodium 135 - 145 mmol/L 138  140  138   Potassium 3.5 - 5.1 mmol/L 3.4  3.4  3.5   Chloride 98 - 111 mmol/L 104  105  101   CO2 22 - 32 mmol/L 20  19    Calcium  8.9 - 10.3 mg/dL 8.8  8.8      MR BRAIN WO CONTRAST Result Date: 01/21/2024 EXAM: MR Brain without Intravenous Contrast. CLINICAL HISTORY: Right hand incoordination. TECHNIQUE: Magnetic resonance images of the brain without intravenous contrast in multiple planes. CONTRAST: Without. COMPARISON: CT head 01/20/2024. FINDINGS: BRAIN: No restricted diffusion to indicate acute infarction. No intracranial mass or acute hemorrhage. No midline shift or extra-axial fluid collection. The central arterial and venous flow voids are patent. Chronic superficial siderosis. Bifrontal encephalomalacia. VENTRICLES: No hydrocephalus. ORBITS: The orbits are normal. SINUSES AND MASTOIDS: The sinuses and mastoid air cells are clear. BONES: No acute fracture or focal osseous lesion. IMPRESSION: 1. No acute findings. 2. Chronic superficial siderosis. 3. Bifrontal encephalomalacia. Electronically signed by: Gilmore Molt MD 01/21/2024 10:00 PM EDT RP Workstation: [id number]   EEG adult Result Date: 01/20/2024 Shelton Arlin KIDD, MD     01/20/2024  9:58 PM Patient Name: Alyssa Little MRN: [id number] Epilepsy Attending: Arlin KIDD Shelton Referring Physician/Provider: Heddy Barren, DO Date: 01/20/2024 Duration: 23.33 mins Patient history:  43 y.o. female with hx of traumatic brain injury, subarachnoid hemorrhage and seizure disorder.  She was brought in by her partner who reported that she appeared to be confused  and having some right-sided weakness. EEG to evaluate for seizure Level of alertness: Awake AEDs during EEG study: LEV, PHT Technical aspects: This EEG study was done with scalp electrodes positioned according to the 10-20 International system of electrode placement. Electrical activity was reviewed with band pass filter of 1-70Hz , sensitivity of 7 uV/mm, display speed of 67mm/sec with a 60Hz  notched filter applied as appropriate. EEG  data were recorded continuously and digitally stored.  Video monitoring was available and reviewed as appropriate. Description:  The posterior dominant rhythm consists of 9 Hz activity of moderate voltage (25-35 uV) seen predominantly in posterior head regions, asymmetric ( left<right) and reactive to eye opening and eye closing. EEG showed continuous 3 to 5 Hz theta-delta slowing in left hemisphere, maximal left temporal region. Hyperventilation and photic stimulation were not performed.  ABNORMALITY - Continuous slow, left hemisphere, maximal left temporal region  IMPRESSION: This technically difficult study is suggestive of cortical dysfunction arising from left hemisphere, maximal left temporal region likely secondary to underlying structural abnormality, post-ictal state. No seizures or epileptiform discharges were seen throughout the recording.  Priyanka MALVA Krebs   CT ANGIO HEAD NECK W WO CM (CODE STROKE) Result Date: 01/20/2024 CLINICAL DATA:  Neuro deficit, acute, stroke suspected. EXAM: CT ANGIOGRAPHY HEAD AND NECK WITH AND WITHOUT CONTRAST TECHNIQUE: Multidetector CT imaging of the head and neck was performed using the standard protocol during bolus administration of intravenous contrast. Multiplanar CT image reconstructions and MIPs were obtained to evaluate the vascular anatomy. Carotid stenosis measurements (when applicable) are obtained utilizing NASCET criteria, using the distal internal carotid diameter as the denominator. RADIATION DOSE REDUCTION: This exam was  performed according to the departmental dose-optimization program which includes automated exposure control, adjustment of the mA and/or kV according to patient size and/or use of iterative reconstruction technique. CONTRAST:  60mL OMNIPAQUE  IOHEXOL  350 MG/ML SOLN COMPARISON:  None Available. FINDINGS: CTA NECK FINDINGS Aortic arch: Standard branching. Widely patent brachiocephalic and subclavian arteries. Right carotid system: Patent without evidence of stenosis or dissection. Left carotid system: Patent without evidence of stenosis or dissection. Vertebral arteries: Patent without evidence of stenosis or dissection. Mildly dominant right vertebral artery. Skeleton: No acute osseous abnormality or suspicious lesion. Other neck: No evidence of cervical lymphadenopathy or mass. Upper chest: Clear lung apices. Review of the MIP images confirms the above findings CTA HEAD FINDINGS Anterior circulation: The internal carotid arteries are widely patent from skull base to carotid termini. ACAs and MCAs are patent without evidence of a proximal branch occlusion or significant proximal stenosis. No aneurysm is identified. Posterior circulation: The intracranial vertebral arteries are patent to the basilar. Patent PICA and SCA origins are visualized bilaterally. The basilar artery is widely patent. The PCAs are patent with fetal type origins bilaterally and no evidence of a significant proximal stenosis. No aneurysm is identified. Venous sinuses: Patent. Anatomic variants: Fetal origin of the PCAs. Review of the MIP images confirms the above findings The study was reviewed in person with Dr. Rosemarie on 01/20/2024 at 1:55 p.m. IMPRESSION: Negative head and neck CTA. Electronically Signed   By: Dasie Hamburg M.D.   On: 01/20/2024 14:30   CT HEAD CODE STROKE WO CONTRAST Result Date: 01/20/2024 CLINICAL DATA:  Code stroke. Neuro deficit, acute, stroke suspected. Seizure. Right grip weakness. EXAM: CT HEAD WITHOUT CONTRAST  TECHNIQUE: Contiguous axial images were obtained from the base of the skull through the vertex without intravenous contrast. RADIATION DOSE REDUCTION: This exam was performed according to the departmental dose-optimization program which includes automated exposure control, adjustment of the mA and/or kV according to patient size and/or use of iterative reconstruction technique. COMPARISON:  Head CT 01/20/2024 at 11:51 a.m. and MRI 04/12/2023 FINDINGS: Brain: There is no evidence of an acute infarct, intracranial hemorrhage, mass, midline shift, hydrocephalus, or extra-axial fluid collection. There is mild cerebral atrophy. Encephalomalacia is again noted in both frontal lobes. Vascular:  No hyperdense vessel. Skull: No fracture or suspicious lesion. Sinuses/Orbits: Visualized paranasal sinuses and mastoid air cells are clear. Unremarkable included orbits. Other: None. ASPECTS (Alberta Stroke Program Early CT Score) - Ganglionic level infarction (caudate, lentiform nuclei, internal capsule, insula, M1-M3 cortex): 7 - Supraganglionic infarction (M4-M6 cortex): 3 Total score (0-10 with 10 being normal): 10 These results were communicated to Dr. Rosemarie at 1:53 pm on 01/20/2024 by text page via the [facility] messaging system. IMPRESSION: 1. No evidence of acute intracranial abnormality. ASPECTS of 10. 2. Bifrontal encephalomalacia. Electronically Signed   By: Dasie Hamburg M.D.   On: 01/20/2024 13:54   CT Head Wo Contrast Result Date: 01/20/2024 EXAM: CT HEAD WITHOUT CONTRAST 01/20/2024 11:56:54 AM TECHNIQUE: CT of the head was performed without the administration of intravenous contrast. Automated exposure control, iterative reconstruction, and/or weight based adjustment of the mA/kV was utilized to reduce the radiation dose to as low as reasonably achievable. COMPARISON: CT of the head dated 04/12/2023. CLINICAL HISTORY: Mental status change, unknown cause. Triage notes; PT to etc from home with CO a seizure. Seizure had a  petite seizure per support person. PER support unwitnessed but he can tell she had one because she is postictal and not herself. PER support person she does not take a blood thinner. PT takes ; phenytoin  300 mg per support person. PT last took this at Midnight. PT unable to give name and birthday at this time. FINDINGS: BRAIN AND VENTRICLES: No acute hemorrhage. No evidence of acute infarct. No hydrocephalus. No extra-axial collection. No mass effect or midline shift. There are chronic encephalomalacia changes present immediately within the frontal lobes bilaterally. There is age-related cerebral volume loss. ORBITS: No acute abnormality. SINUSES: No acute abnormality. SOFT TISSUES AND SKULL: No acute soft tissue abnormality. No skull fracture. IMPRESSION: 1. No acute intracranial abnormality. 2. Chronic encephalomalacia in the frontal lobes bilaterally and age-related cerebral volume loss. Electronically signed by: Evalene Coho MD 01/20/2024 11:59 AM EDT RP Workstation: [id number]     Discharge Instructions: Discharge Instructions     Call MD for:  difficulty breathing, headache or visual disturbances   Complete by: As directed    Call MD for:  extreme fatigue   Complete by: As directed    Call MD for:  hives   Complete by: As directed    Call MD for:  persistant dizziness or light-headedness   Complete by: As directed    Call MD for:  persistant nausea and vomiting   Complete by: As directed    Call MD for:  severe uncontrolled pain   Complete by: As directed    Call MD for:  temperature >100.4   Complete by: As directed    Diet - low sodium heart healthy   Complete by: As directed    Discharge instructions   Complete by: As directed    Thank you for allowing us  to be part of your care. You were hospitalized for seizure. We treated you with seizure medication and monitoring.  Neurology has signed off for your discharge.  The MRI 01/21/2024 did not show any new concerning process  occurring.  You have made great improvement from your admission.  If you continue having issues with your right hand, follow-up with your primary care doctor and neurologist.  See the changes in your medications and management of your chronic conditions below:  Recommendations per neurology  - Continue Keppra  3000 mg XR nightly - Continue dilantin  ER 300mg  daily --Check dilantin  level in 2  weeks with PCP or neurologist - Continue seizure precautions   NO DRIVING for 6 months after last seizure (per [place] law)  PLEASE HAVE close follow-up with your established outpatient neurologist  Please follow up with your psychiatrist to speak about the possible auditory hallucinations.   Please Follow up with our clinic. You should be contacted soon regarding date and time.   Please call your PCP or our clinic if you have any questions or concerns, we may be able to help and keep you from a long and expensive emergency room wait. Our clinic and after hours phone number is [phone]. The best time to call is Monday through Friday 9 am to 4 pm but there is always someone available 24/7 if you have an emergency. If you need medication refills please notify your pharmacy one week in advance and they will send us  a request.   We are glad you are feeling better,  Sallyanne Primas Internal Medicine Inpatient Teaching Service at [facility]   Increase activity slowly   Complete by: As directed        Signed: Trudy Mliss Dragon, MD 01/25/2024, 11:30 AM

## 2024-01-22 NOTE — ED Notes (Signed)
 Provider speaking to pt and spouse via phone to discuss d/c and f/u instructions

## 2024-01-22 NOTE — ED Notes (Signed)
 Per MD, pt to discharge within 1 hour.

## 2024-01-22 NOTE — Plan of Care (Signed)
 Neurology plan of care  Please see neurology progress note from yesterday for full findings and recommendations. MRI brain overnight showed no acute process. Patient is cleared for discharge.  Final recommendations: - Continue Keppra  3000 mg XR nightly - Continue dilantin  ER 300mg  daily --Check dilantin  level in 2 weeks with PCP or neurologist - Continue seizure precautions - Encourage close follow-up with her established outpatient neurologist --Please counsel patient not to drive for 6 mos after last seizure (per [place] law)  Alyssa Ross, MD [facility] [phone]  If 7pm- 7am, please page neurology on call as listed in AMION.

## 2024-01-22 NOTE — Progress Notes (Incomplete)
 HD#2 SUBJECTIVE:  Patient Summary: Alyssa Little is a 43 y.o. with a pertinent PMH of grand mal seizures, SAH and TBI (2019), anxiety, PTSD, who presented with encephalopathy and admitted for seizure workup.   Overnight Events: No overnight events  Interim History: Seen bedside.   OBJECTIVE:  Vital Signs: Vitals:   01/21/24 2225 01/21/24 2330 01/22/24 0230 01/22/24 0335  BP: (!) 140/96 120/88 109/77 122/79  Pulse: 88 82 87 76  Resp:  20 16 18   Temp:  98.9 F (37.2 C)  98.8 F (37.1 C)  TempSrc:  Oral  Oral  SpO2: 98% 94% 96% 99%  Weight:      Height:       Supplemental O2: Room Air SpO2: 99 %  Filed Weights   01/20/24 1025  Weight: 45.4 kg    No intake or output data in the 24 hours ending 01/22/24 0640 Net IO Since Admission: No IO data has been entered for this period [01/22/24 0640]  Physical Exam: Physical Exam Constitutional:      General: She is not in acute distress.    Appearance: She is not ill-appearing or toxic-appearing.  HENT:     Head:     Comments: Pink coloration to cheeks and lower face with acne like bumps.    Mouth/Throat:     Comments: Poor dentition and likely gum disease. Eyes:     Extraocular Movements: Extraocular movements intact.     Pupils: Pupils are equal, round, and reactive to light.  Cardiovascular:     Rate and Rhythm: Regular rhythm. Tachycardia present.     Heart sounds: No murmur heard.    No gallop.  Pulmonary:     Effort: Pulmonary effort is normal.     Breath sounds: Normal breath sounds. No stridor. No wheezing, rhonchi or rales.  Abdominal:     General: Abdomen is flat.     Palpations: Abdomen is soft.     Tenderness: There is no abdominal tenderness. There is no guarding.  Musculoskeletal:     Right lower leg: No edema.     Left lower leg: No edema.  Skin:    General: Skin is warm and dry.  Neurological:     Mental Status: She is alert.     Cranial Nerves: No cranial nerve deficit.     Motor: No  weakness.     Comments: Oriented to time and situation.  Difficulty expressing name, but also oriented to self. Cranial nerve exam grossly intact. Endorsed decreased sensation to right face, right upper extremity, right lower extremity compared to the left. Motor 5/5 in upper and lower extremities bilaterally. Difficulty following all instructions such as squeeze my finger  Psychiatric:        Mood and Affect: Mood normal.        Behavior: Behavior normal.     Patient Lines/Drains/Airways Status     Active Line/Drains/Airways     Name Placement date Placement time Site Days   Peripheral IV 01/20/24 20 G Right Antecubital 01/20/24  1114  Antecubital  1            Pertinent labs and imaging:      Latest Ref Rng & Units 01/22/2024    3:37 AM 01/21/2024    4:49 AM 01/20/2024    2:26 PM  CBC  WBC 4.0 - 10.5 K/uL 8.7  13.1    Hemoglobin 12.0 - 15.0 g/dL 87.2  87.1  87.7   Hematocrit 36.0 - 46.0 %  38.2  39.2  36.0   Platelets 150 - 400 K/uL 277  305         Latest Ref Rng & Units 01/22/2024    3:37 AM 01/21/2024    4:49 AM 01/20/2024    2:26 PM  CMP  Glucose 70 - 99 mg/dL 891  78  86   BUN 6 - 20 mg/dL <5  6  <3   Creatinine 0.44 - 1.00 mg/dL 9.39  9.28  9.39   Sodium 135 - 145 mmol/L 138  140  138   Potassium 3.5 - 5.1 mmol/L 3.4  3.4  3.5   Chloride 98 - 111 mmol/L 104  105  101   CO2 22 - 32 mmol/L 20  19    Calcium  8.9 - 10.3 mg/dL 8.8  8.8      MR BRAIN WO CONTRAST Result Date: 01/21/2024 EXAM: MR Brain without Intravenous Contrast. CLINICAL HISTORY: Right hand incoordination. TECHNIQUE: Magnetic resonance images of the brain without intravenous contrast in multiple planes. CONTRAST: Without. COMPARISON: CT head 01/20/2024. FINDINGS: BRAIN: No restricted diffusion to indicate acute infarction. No intracranial mass or acute hemorrhage. No midline shift or extra-axial fluid collection. The central arterial and venous flow voids are patent. Chronic superficial  siderosis. Bifrontal encephalomalacia. VENTRICLES: No hydrocephalus. ORBITS: The orbits are normal. SINUSES AND MASTOIDS: The sinuses and mastoid air cells are clear. BONES: No acute fracture or focal osseous lesion. IMPRESSION: 1. No acute findings. 2. Chronic superficial siderosis. 3. Bifrontal encephalomalacia. Electronically signed by: Gilmore Molt MD 01/21/2024 10:00 PM EDT RP Workstation: [id number]    ASSESSMENT/PLAN:  Assessment: Principal Problem:   Receptive aphasia Active Problems:   Seizures (HCC)   TBI (traumatic brain injury) (HCC)  Plan:  Encephalopathy  Expressive aphasia History of seizure History of postictal psychosis Encephalomalacia Hematuria MRI: No acute findings, chronic superficial siderosis. Bifrontal encephalomalacia  Physical exam:  Assessment:  -Continue Keppra  3 g daily - Increase phenytoin  per pharmacy prior to discharge -Seizure precautions  (Chronic deposition of hemosiderin, from [facility])  Hypokalemia Potassium 3.4 -Repleted  Leukocytosis - resolved   Best Practice: Diet: Regular diet VTE: heparin  injection 5,000 Units Start: 01/20/24 2200 Code: Full  Disposition planning: Therapy Recs: Pending, DME: none Family Contact: Maude Maier, to be notified. DISPO: Anticipated discharge ***  Signature:  Nannie Starzyk [facility]  6:40 AM, 01/22/2024  On Call pager [phone]

## 2024-01-23 ENCOUNTER — Telehealth: Payer: Self-pay

## 2024-01-23 NOTE — Transitions of Care (Post Inpatient/ED Visit) (Unsigned)
   01/23/2024  Name: Francella Barnett MRN: [id number] DOB: 02-06-1981  Today's TOC FU Call Status: Today's TOC FU Call Status:: Unsuccessful Call (1st Attempt) Unsuccessful Call (1st Attempt) Date: 01/23/24  Attempted to reach the patient regarding the most recent Inpatient/ED visit.  Follow Up Plan: Additional outreach attempts will be made to reach the patient to complete the Transitions of Care (Post Inpatient/ED visit) call.   Signature Julian Lemmings, LPN [facility] Nurse Health Advisor Direct Dial [phone]

## 2024-01-24 NOTE — Transitions of Care (Post Inpatient/ED Visit) (Signed)
 01/24/2024  Name: Alyssa Little MRN: [id number] DOB: August 02, 1980  Today's TOC FU Call Status: Today's TOC FU Call Status:: Successful TOC FU Call Completed Unsuccessful Call (1st Attempt) Date: 01/23/24 [facility] FU Call Complete Date: 01/24/24 Patient's Name and Date of Birth confirmed.  Transition Care Management Follow-up Telephone Call Date of Discharge: 01/22/24 Discharge Facility: [facility]) Type of Discharge: Inpatient Admission Primary Inpatient Discharge Diagnosis:: altered mental How have you been since you were released from the hospital?: Better Any questions or concerns?: No  Items Reviewed: Did you receive and understand the discharge instructions provided?: Yes Medications obtained,verified, and reconciled?: Yes (Medications Reviewed) Any new allergies since your discharge?: No Dietary orders reviewed?: Yes Do you have support at home?: Yes People in Home [RPT]: significant other  Medications Reviewed Today: Medications Reviewed Today     Reviewed by Emmitt Pan, LPN (Licensed Practical Nurse) on 01/24/24 at 1138  Med List Status: <None>   Medication Order Taking? Sig Documenting Provider Last Dose Status Informant  folic acid  (FOLVITE ) 1 MG tablet [id number] Yes Take 1 tablet (1 mg total) by mouth daily. Waymond Cart, MD  Active Spouse/Significant Other, Pharmacy Records, Multiple Informants  hydrOXYzine  (ATARAX ) 10 MG tablet [id number] Yes Take 1 tablet (10 mg total) by mouth 3 (three) times daily as needed.  Patient taking differently: Take 10 mg by mouth 3 (three) times daily as needed for itching, anxiety, nausea or vomiting.   Fernand Prost, MD  Active Spouse/Significant Other, Pharmacy Records, Multiple Informants  ibuprofen (ADVIL) 200 MG tablet [id number] Yes Take 200-400 mg by mouth every 6 (six) hours as needed for cramping or headache. [provider]  Active Spouse/Significant Other, Pharmacy Records, Multiple Informants   levETIRAcetam  (KEPPRA  XR) 500 MG 24 hr tablet [id number] Yes Take 6 tablets (3,000 mg total) by mouth at bedtime. Jolaine Pac, DO  Active Spouse/Significant Other, Pharmacy Records, Multiple Informants  Multiple Vitamin (MULTIVITAMIN WITH MINERALS) TABS tablet [id number] Yes Take 1 tablet by mouth daily. Kandis Perkins, DO  Active Spouse/Significant Other, Pharmacy Records, Multiple Informants  PHENYTEK  300 MG ER capsule [id number] Yes Take one capsule (300 mg dose) by mouth daily. BRAND NAME MEDICALLY NECESSARY   Active Spouse/Significant Other, Pharmacy Records, Multiple Informants  propranolol  (INDERAL ) 10 MG tablet [id number] Yes Take 1 tablet (10 mg total) by mouth 2 (two) times daily as needed.  Patient taking differently: Take 10 mg by mouth 2 (two) times daily as needed (anxiety).   Marry Clamp, MD  Active Spouse/Significant Other, Pharmacy Records, Multiple Informants  thiamine  (VITAMIN B1) 100 MG tablet [id number] Yes Take 1 tablet (100 mg total) by mouth daily. Masters, Izetta, DO  Active Spouse/Significant Other, Pharmacy Records, Multiple Informants  TYLENOL  500 MG tablet [id number] Yes Take 500-1,000 mg by mouth every 6 (six) hours as needed for mild pain (or headaches). [provider]  Active Spouse/Significant Other, Pharmacy Records, Multiple Informants            Home Care and Equipment/Supplies: Were Home Health Services Ordered?: NA Any new equipment or medical supplies ordered?: NA  Functional Questionnaire: Do you need assistance with bathing/showering or dressing?: No Do you need assistance with meal preparation?: No Do you need assistance with eating?: No Do you have difficulty maintaining continence: No Do you need assistance with getting out of bed/getting out of a chair/moving?: No Do you have difficulty managing or taking your medications?: No  Follow up appointments reviewed: PCP Follow-up appointment confirmed?: Yes Date of PCP follow-up  appointment?:  02/04/24 Follow-up Provider: [facility] Follow-up appointment confirmed?: No Reason Specialist Follow-Up Not Confirmed: Patient has Specialist Provider Number and will Call for Appointment Do you need transportation to your follow-up appointment?: No Do you understand care options if your condition(s) worsen?: Yes-patient verbalized understanding    SIGNATURE Julian Lemmings, LPN [facility] Nurse Health Advisor Direct Dial [phone]

## 2024-01-25 DIAGNOSIS — G8384 Todd's paralysis (postepileptic): Secondary | ICD-10-CM | POA: Diagnosis present

## 2024-01-25 DIAGNOSIS — Z8679 Personal history of other diseases of the circulatory system: Secondary | ICD-10-CM

## 2024-02-04 ENCOUNTER — Ambulatory Visit

## 2024-02-04 VITALS — BP 106/77 | HR 75 | Temp 97.9°F | Ht 62.0 in | Wt 96.8 lb

## 2024-02-04 DIAGNOSIS — Z79899 Other long term (current) drug therapy: Secondary | ICD-10-CM | POA: Diagnosis not present

## 2024-02-04 DIAGNOSIS — F17210 Nicotine dependence, cigarettes, uncomplicated: Secondary | ICD-10-CM | POA: Diagnosis not present

## 2024-02-04 DIAGNOSIS — E876 Hypokalemia: Secondary | ICD-10-CM | POA: Diagnosis not present

## 2024-02-04 DIAGNOSIS — G8384 Todd's paralysis (postepileptic): Secondary | ICD-10-CM | POA: Diagnosis not present

## 2024-02-04 DIAGNOSIS — G40909 Epilepsy, unspecified, not intractable, without status epilepticus: Secondary | ICD-10-CM

## 2024-02-04 DIAGNOSIS — I69398 Other sequelae of cerebral infarction: Secondary | ICD-10-CM | POA: Diagnosis not present

## 2024-02-04 NOTE — Assessment & Plan Note (Addendum)
 Almost complete resolution.  Stated that she has not been dropping things.  She endorses a continued slight sensation deficit to tip of middle finger on right hand.  On physical exam she denied sensation difference.  She endorses occasional loosening of grip in right hand although improving.  - Continue monitoring for improvement

## 2024-02-04 NOTE — Patient Instructions (Addendum)
 Today we discussed the following medical conditions and plan:   I'm glad you're feeling better!   -Follow up with neurologist Vira, Norleen BROCKS, MD [phone] (Work)) -Follow up with psychiatrist (Dr. Izella, you have an appointment in December, try to be seen sooner.) If you hear voices that tell you to do anything (harmful or not, please call your psychiatrist)  -We are checking phenytoin  level now, if it is borderline low we will have you come back another day in the afternoon.  -Checking potassium, if low I will order some potassium for you.   We look forward to seeing you next time. Please call our clinic at [phone] if you have any questions or concerns. The best time to call is Monday-Friday from 9am-4pm, but there is someone available 24/7. If you need medication refills, please notify your pharmacy one week in advance and they will send us  a request.   Thank you for trusting me with your care. Wishing you the best!   Sallyanne Primas, DO  [facility]

## 2024-02-04 NOTE — Progress Notes (Signed)
 CC: Hospital follow-up   HPI:  [name] is a 43 y.o. female with pertinent past medical history of seizures, SAH, TBI (further medical history stated below) and presents today for hospital follow-up. Please see problem based assessment and plan for additional details.  Follow up Hospitalization  Patient was admitted to [facility] on 01/20/2024 and discharged on 01/22/2024. She was treated for seizures and Todd's paralysis. Treatment for this included loaded initially with 500 mg IV Keppra  and restarted on home medications. Telephone follow up was done on 01/23/2024 She reports excellent compliance with treatment. She reports this condition is improved.  Last Pertinent Labs Documented:     Latest Ref Rng & Units 01/22/2024    3:37 AM 01/21/2024    4:49 AM 01/20/2024    2:26 PM  BMP  Glucose 70 - 99 mg/dL 891  78  86   BUN 6 - 20 mg/dL <5  6  <3   Creatinine 0.44 - 1.00 mg/dL 9.39  9.28  9.39   Sodium 135 - 145 mmol/L 138  140  138   Potassium 3.5 - 5.1 mmol/L 3.4  3.4  3.5   Chloride 98 - 111 mmol/L 104  105  101   CO2 22 - 32 mmol/L 20  19    Calcium  8.9 - 10.3 mg/dL 8.8  8.8         Latest Ref Rng & Units 01/22/2024    3:37 AM 01/21/2024    4:49 AM 01/20/2024    2:26 PM  CBC  WBC 4.0 - 10.5 K/uL 8.7  13.1    Hemoglobin 12.0 - 15.0 g/dL 87.2  87.1  87.7   Hematocrit 36.0 - 46.0 % 38.2  39.2  36.0   Platelets 150 - 400 K/uL 277  305      No results found for: HGBA1C   Past Medical History:  Diagnosis Date   Alcohol abuse    Hypokalemia 10/19/2022   SAH (subarachnoid hemorrhage) (HCC)    Seizure disorder (HCC)    TBI (traumatic brain injury) (HCC)    Transaminitis     Current Outpatient Medications on File Prior to Visit  Medication Sig Dispense Refill   folic acid  (FOLVITE ) 1 MG tablet Take 1 tablet (1 mg total) by mouth daily. 30 tablet 2   hydrOXYzine  (ATARAX ) 10 MG tablet Take 1 tablet (10 mg total) by mouth 3 (three) times daily as needed. (Patient  taking differently: Take 10 mg by mouth 3 (three) times daily as needed for itching, anxiety, nausea or vomiting.)     ibuprofen (ADVIL) 200 MG tablet Take 200-400 mg by mouth every 6 (six) hours as needed for cramping or headache.     levETIRAcetam  (KEPPRA  XR) 500 MG 24 hr tablet Take 6 tablets (3,000 mg total) by mouth at bedtime. 180 tablet 3   Multiple Vitamin (MULTIVITAMIN WITH MINERALS) TABS tablet Take 1 tablet by mouth daily. 30 tablet 3   PHENYTEK  300 MG ER capsule Take one capsule (300 mg dose) by mouth daily. BRAND NAME MEDICALLY NECESSARY 90 capsule 5   propranolol  (INDERAL ) 10 MG tablet Take 1 tablet (10 mg total) by mouth 2 (two) times daily as needed. (Patient taking differently: Take 10 mg by mouth 2 (two) times daily as needed (anxiety).) 60 tablet 2   thiamine  (VITAMIN B1) 100 MG tablet Take 1 tablet (100 mg total) by mouth daily. 90 tablet 3   TYLENOL  500 MG tablet Take 500-1,000 mg by mouth every 6 (  six) hours as needed for mild pain (or headaches).     No current facility-administered medications on file prior to visit.    Family History  Family history unknown: Yes    Social History   Socioeconomic History   Marital status: Single    Spouse name: Not on file   Number of children: Not on file   Years of education: Not on file   Highest education level: Not on file  Occupational History   Not on file  Tobacco Use   Smoking status: Some Days    Current packs/day: 0.25    Types: Cigarettes    Passive exposure: Current   Smokeless tobacco: Never   Tobacco comments:    Quit smoking around 8 mths ago (feb 2024) , spouse still smokes and will smoke one of his from time to time  Vaping Use   Vaping status: Unknown  Substance and Sexual Activity   Alcohol use: Not Currently    Alcohol/week: 1.0 standard drink of alcohol    Types: 1 Glasses of wine per week    Comment: 1 drink per day   Drug use: Never   Sexual activity: Not Currently    Partners: Male  Other  Topics Concern   Not on file  Social History Narrative   ** Merged History Encounter **       Social Drivers of Health   Financial Resource Strain: Low Risk  (05/15/2023)   Received from [facility]   Overall Financial Resource Strain (CARDIA)    Difficulty of Paying Living Expenses: Not very hard  Food Insecurity: No Food Insecurity (05/15/2023)   [facility] from [facility]   Hunger Vital Sign    Within the past 12 months, you worried that your food would run out before you got the money to buy more.: Never true    Within the past 12 months, the food you bought just didn't last and you didn't have money to get more.: Never true  Transportation Needs: No Transportation Needs (05/15/2023)   Received from [facility] - Transportation    Lack of Transportation (Medical): No    Lack of Transportation (Non-Medical): No  Physical Activity: Insufficiently Active (07/31/2023)   Exercise Vital Sign    Days of Exercise per Week: 2 days    Minutes of Exercise per Session: 30 min  Stress: No Stress Concern Present (07/31/2023)   [employer] of Occupational [facility] - Occupational Stress Questionnaire    Feeling of Stress : Only a little  Social Connections: Moderately Isolated (07/31/2023)   Social Connection and Isolation Panel    Frequency of Communication with Friends and Family: Never    Frequency of Social Gatherings with Friends and Family: More than three times a week    Attends Religious Services: Never    Database administrator or Organizations: No    Attends Banker Meetings: Never    Marital Status: Living with partner  Intimate Partner Violence: Not At Risk (07/31/2023)   Humiliation, Afraid, Rape, and Kick questionnaire    Fear of Current or Ex-Partner: No    Emotionally Abused: No    Physically Abused: No    Sexually Abused: No    Review of Systems: As stated below  Vitals:   02/04/24 0809  BP: 106/77  Pulse: 75  Temp: 97.9 F (36.6 C)   TempSrc: Oral  SpO2: 100%  Weight: 96 lb 12.8 oz (43.9 kg)  Height: 5' 2 (1.575 m)  Physical Exam: Physical Exam Constitutional:      General: She is not in acute distress.    Appearance: She is not ill-appearing or toxic-appearing.  HENT:     Mouth/Throat:     Comments: Black linear pigmentation present along gumline. Cardiovascular:     Rate and Rhythm: Normal rate and regular rhythm.     Heart sounds: Normal heart sounds. No murmur heard.    No friction rub. No gallop.  Pulmonary:     Effort: Pulmonary effort is normal. No respiratory distress.     Breath sounds: Normal breath sounds. No stridor. No wheezing or rhonchi.  Abdominal:     General: Abdomen is flat.     Palpations: Abdomen is soft.     Tenderness: There is no abdominal tenderness.  Musculoskeletal:     Right lower leg: No edema.     Left lower leg: No edema.  Skin:    General: Skin is warm and dry.  Neurological:     General: No focal deficit present.     Mental Status: She is alert and oriented to person, place, and time.     Sensory: No sensory deficit.     Motor: No weakness.  Psychiatric:        Mood and Affect: Mood normal.        Behavior: Behavior normal.        Thought Content: Thought content normal.      Assessment & Plan:   Patient seen with Dr. Lovie Assessment & Plan Seizure disorder as sequela of cerebrovascular accident [facility]) Hospital follow-up for seizure activity, postictal state, Todd's paralysis.  Takes seizure medications at 7 PM nightly.  Hasn't followed up on psychiatrist yet but they will make an appointment.  Possible auditory hallucination first thing in the morning when she wakes up but thinks she's could be remembering dreams.  Speech is not harmful, encouraging self-harm, suicidal ideation, homicidal ideation. Overall great improvement.  Phenytoin  level ordered, however not a true trough state.  If borderline we will repeat closer to nighttime as that is when she takes  her next dose of medication. - Continue Keppra  3 g and dilantin  ER 300 mg daily.  - Phenytoin  free level ordered. - Encouraged follow-up with neurology (provided with contact information and wrap-up) - Courage follow-up with psychiatry [facility]) partner stated that he had the contact information and will call.  Has appointment in December, but encouraged to be seen sooner. - Encouraged patient to call psychiatrist if she starts hearing voices requesting she do harm less or harmful actions. Todd's paralysis (postepileptic) (HCC) Almost complete resolution.  Stated that she has not been dropping things.  She endorses a continued slight sensation deficit to tip of middle finger on right hand.  On physical exam she denied sensation difference.  She endorses occasional loosening of grip in right hand although improving.  - Continue monitoring for improvement Hypokalemia Required potassium repletion during hospitalization.  Will follow-up with BMP -BMP ordered   Orders Placed This Encounter  Procedures   Basic metabolic panel with GFR   Phenytoin  level, free     Rayburn Mundis, D.O. [facility] Internal Medicine, PGY-1 Date 02/04/2024 Time 7:18 PM

## 2024-02-04 NOTE — Assessment & Plan Note (Addendum)
 Hospital follow-up for seizure activity, postictal state, Todd's paralysis.  Takes seizure medications at 7 PM nightly.  Hasn't followed up on psychiatrist yet but they will make an appointment.  Possible auditory hallucination first thing in the morning when she wakes up but thinks she's could be remembering dreams.  Speech is not harmful, encouraging self-harm, suicidal ideation, homicidal ideation. Overall great improvement.  Phenytoin  level ordered, however not a true trough state.  If borderline we will repeat closer to nighttime as that is when she takes her next dose of medication. - Continue Keppra  3 g and dilantin  ER 300 mg daily.  - Phenytoin  free level ordered. - Encouraged follow-up with neurology (provided with contact information and wrap-up) - Courage follow-up with psychiatry [facility]) partner stated that he had the contact information and will call.  Has appointment in December, but encouraged to be seen sooner. - Encouraged patient to call psychiatrist if she starts hearing voices requesting she do harm less or harmful actions.

## 2024-02-05 LAB — BASIC METABOLIC PANEL WITH GFR
BUN/Creatinine Ratio: 18 (ref 9–23)
BUN: 10 mg/dL (ref 6–24)
CO2: 23 mmol/L (ref 20–29)
Calcium: 9.8 mg/dL (ref 8.7–10.2)
Chloride: 104 mmol/L (ref 96–106)
Creatinine, Ser: 0.57 mg/dL (ref 0.57–1.00)
Glucose: 81 mg/dL (ref 70–99)
Potassium: 4.6 mmol/L (ref 3.5–5.2)
Sodium: 141 mmol/L (ref 134–144)
eGFR: 115 mL/min/1.73 (ref >59)

## 2024-02-05 LAB — PHENYTOIN LEVEL, FREE: Phenytoin, Free: 0.8 ug/mL — ABNORMAL LOW (ref 1.0–2.0)

## 2024-02-06 ENCOUNTER — Ambulatory Visit: Payer: Self-pay

## 2024-02-06 NOTE — Telephone Encounter (Signed)
 Spoke with Alyssa Little (patient's significant other; consent from patient to discuss health related information given).  Relayed that phenytoin  level was low and an increase dose in phenytoin  will be required.  They stated that they have not made a neurology appointment yet, and I encouraged them to set up the appointment today due to the increase in dosing.  I will forward the lab information to the patient's neurologist.  Sallyanne Primas, DO

## 2024-02-06 NOTE — Telephone Encounter (Signed)
 Spoke with neurologist at [facility] health 02/06/2024.  He stated that there is no need to increase seizure medications at this time.  He will follow-up with her in clinic 02/14/2024.  Called the patient to make them aware of the scheduling and how there will be no changes to her medications at this time.  Patient had no further questions and said that she was doing well.  Kamrin Sibley, DO

## 2024-02-10 NOTE — Progress Notes (Signed)
Internal Medicine Clinic Attending  I was physically present during the key portions of the resident provided service and participated in the medical decision making of patient's management care. I reviewed pertinent patient test results.  The assessment, diagnosis, and plan were formulated together and I agree with the documentation in the resident's note.  Mercie Eon, MD

## 2024-02-12 ENCOUNTER — Other Ambulatory Visit: Payer: Self-pay

## 2024-02-12 ENCOUNTER — Other Ambulatory Visit: Payer: Self-pay | Admitting: Student

## 2024-02-12 ENCOUNTER — Other Ambulatory Visit (HOSPITAL_COMMUNITY): Payer: Self-pay

## 2024-02-12 DIAGNOSIS — G40909 Epilepsy, unspecified, not intractable, without status epilepticus: Secondary | ICD-10-CM

## 2024-02-12 DIAGNOSIS — D7589 Other specified diseases of blood and blood-forming organs: Secondary | ICD-10-CM

## 2024-02-12 MED ORDER — FOLIC ACID 1 MG PO TABS
1.0000 mg | ORAL_TABLET | Freq: Every day | ORAL | 2 refills | Status: DC
  Filled 2024-02-12: qty 30, 30d supply, fill #0
  Filled 2024-03-13: qty 30, 30d supply, fill #1
  Filled 2024-04-10: qty 30, 30d supply, fill #2

## 2024-02-12 MED ORDER — LEVETIRACETAM ER 500 MG PO TB24
3000.0000 mg | ORAL_TABLET | Freq: Every day | ORAL | 11 refills | Status: AC
  Filled 2024-02-12: qty 180, 30d supply, fill #0
  Filled 2024-03-13: qty 180, 30d supply, fill #1
  Filled 2024-04-10: qty 180, 30d supply, fill #2
  Filled 2024-05-15: qty 180, 30d supply, fill #3

## 2024-03-13 ENCOUNTER — Other Ambulatory Visit (HOSPITAL_COMMUNITY): Payer: Self-pay

## 2024-03-13 ENCOUNTER — Other Ambulatory Visit: Payer: Self-pay

## 2024-03-20 ENCOUNTER — Encounter (HOSPITAL_COMMUNITY): Admitting: Psychiatry

## 2024-04-10 ENCOUNTER — Other Ambulatory Visit (HOSPITAL_COMMUNITY): Payer: Self-pay

## 2024-04-13 ENCOUNTER — Other Ambulatory Visit (HOSPITAL_COMMUNITY): Payer: Self-pay

## 2024-04-19 NOTE — Progress Notes (Unsigned)
 [facility] MD Outpatient Progress Note  Alyssa Little  MRN:  [id number]  Assessment:  Alyssa Little presents for follow-up evaluation. In the prior visit, patient's psychotropic medication regimen was maintained. Patient's anxiety is fairly controlled amidst new psychosocial stressors of a recent death in the family. I discussed the different indications for taking either hydroxyzine  or propranolol . Patient shows good judgement utilizing her coping strategies during times of anxiety. No med changes today, f/u in 3 months.   Plan:  # Panic attacks - Continue Propranolol  10 mg BID PRN - Continue Atarax  25 mg TID PRN  # Alcohol use disorder, in sustained remission Most recent was December 2024 - Encourage maintenance of cessation  # Seizure disorder  -- Last documented on 01/2024 -- Continue antiepileptics as prescribed by neurology - Continue Dilantin  300 mg daily - Continue Keppra  3000 mg daily  Patient was given contact information for behavioral health clinic and was instructed to call 911 for emergencies.   Identifying Information: Alyssa Little is a 44 year old female with a history of alcohol use disorder and postictal psychosis who is an established patient with [facility] for management of panic attacks.    Interval History:   Patient seen with her husband.  Patient reports feeling fairly good today. Since the previous visit, she notes her brother completed suicide. She has been coping by surrounding herself with family.  Regarding psychiatric symptoms, she feels her anxiety is controlled. She states her last panic attack was one month ago [place] [facility] [place] [facility] [place]. She states that she's only used her atarax  once a month, most recently when getting out of the hospital.  Patient reports the medications are doing well. She states being compliant on her seizure meds.   Patient reports good sleep, reporting 8-10 hours  of sleep. Patient reports fair appetite, she does try to meal at least one meal daily.   Patient denies current SI, HI, and AVH.   Substance use:  tobacco use-daily- one cigarette for 20 years; denies illicit substance use   Past Psychiatric History:  Patient denies prior suicide attempts or self-harm behavior.  Denies prior psychiatric hospitalization.  Denies access to firearms. She reports a brief episode of therapy in her college years.  Denies any other mental health treatment.   Family Psychiatric History: None pertinent  Social History:  Living: lives with partner in [place] Occupation: unemployed Children: denies Support: husband  Past Medical History:  Past Medical History:  Diagnosis Date   Alcohol abuse    Hypokalemia 10/19/2022   SAH (subarachnoid hemorrhage) (HCC)    Seizure disorder (HCC)    TBI (traumatic brain injury) (HCC)    Transaminitis     Past Surgical History:  Procedure Laterality Date   CHOLECYSTECTOMY      Family History:  Family History  Family history unknown: Yes    Social History   Socioeconomic History   Marital status: Single    Spouse name: Not on file   Number of children: Not on file   Years of education: Not on file   Highest education level: Not on file  Occupational History   Not on file  Tobacco Use   Smoking status: Some Days    Current packs/day: 0.25    Types: Cigarettes    Passive exposure: Current   Smokeless tobacco: Never   Tobacco comments:    Quit smoking around 8 mths ago (feb 2024) , spouse still smokes and will smoke one of  his from time to time  Vaping Use   Vaping status: Unknown  Substance and Sexual Activity   Alcohol use: Not Currently    Alcohol/week: 1.0 standard drink of alcohol    Types: 1 Glasses of wine per week    Comment: 1 drink per day   Drug use: Never   Sexual activity: Not Currently    Partners: Male  Other Topics Concern   Not on file  Social History Narrative   ** Merged  History Encounter **       Social Drivers of Health   Tobacco Use: Medium Risk (02/14/2024)   Received from [facility]   Patient History    Passive Exposure: Never    Smokeless Tobacco Use: Never    Smoking Tobacco Use: Former  Programmer, Applications: Low Risk (05/15/2023)   Received from [facility]   Overall Financial Resource Strain (CARDIA)    Difficulty of Paying Living Expenses: Not very hard  Food Insecurity: No Food Insecurity (05/15/2023)   Received from [facility]   Epic    Within the past 12 months, you worried that your food would run out before you got the money to buy more.: Never true    Within the past 12 months, the food you bought just didn't last and you didn't have money to get more.: Never true  Transportation Needs: No Transportation Needs (05/15/2023)   Received from [facility] - Transportation    Lack of Transportation (Medical): No    Lack of Transportation (Non-Medical): No  Physical Activity: Insufficiently Active (07/31/2023)   Exercise Vital Sign    Days of Exercise per Week: 2 days    Minutes of Exercise per Session: 30 min  Stress: No Stress Concern Present (07/31/2023)   [employer] [facility] [employer] - Occupational Stress Questionnaire    Feeling of Stress : Only a little  Social Connections: Moderately Isolated (07/31/2023)   Social Connection and Isolation Panel    Frequency of Communication with Friends and Family: Never    Frequency of Social Gatherings with Friends and Family: More than three times a week    Attends Religious Services: Never    Database Administrator or Organizations: No    Attends Banker Meetings: Never    Marital Status: Living with partner  Depression (PHQ2-9): Low Risk (07/31/2023)   Depression (PHQ2-9)    PHQ-2 Score: 1  Alcohol Screen: Low Risk (07/31/2023)   Alcohol Screen    Last Alcohol Screening Score (AUDIT): 0  Housing: Low Risk (05/15/2023)   [facility]    In the last 12 months, was there a time when you were not able to pay the mortgage or rent on time?: No    In the past 12 months, how many times have you moved where you were living?: 1    At any time in the past 12 months, were you homeless or living in a shelter (including now)?: No  Utilities: Not At Risk (05/15/2023)   Received from [facility] Utilities    Threatened with loss of utilities: No  Health Literacy: Inadequate Health Literacy (07/31/2023)   B1300 Health Literacy    Frequency of need for help with medical instructions: Sometimes    Allergies:  Allergies  Allergen Reactions   Shellfish Allergy Anaphylaxis   Flagyl  [Metronidazole ] Hives   Penicillins Hives, Rash and Other (See Comments)    Tolerated cephalosporins  including ancef   Has patient had a PCN reaction causing immediate rash, facial/tongue/throat swelling, SOB or lightheadedness with hypotension: Unknown Has patient had a PCN reaction causing severe rash involving mucus membranes or skin necrosis: Unknown Has patient had a PCN reaction that required hospitalization: Unknown Has patient had a PCN reaction occurring within the last 10 years: Unknown If all of the above answers are NO, then may    Current Medications: Current Outpatient Medications  Medication Sig Dispense Refill   folic acid  (FOLVITE ) 1 MG tablet Take 1 tablet (1 mg total) by mouth daily. 30 tablet 2   hydrOXYzine  (ATARAX ) 10 MG tablet Take 1 tablet (10 mg total) by mouth 3 (three) times daily as needed. (Patient taking differently: Take 10 mg by mouth 3 (three) times daily as needed for itching, anxiety, nausea or vomiting.)     ibuprofen (ADVIL) 200 MG tablet Take 200-400 mg by mouth every 6 (six) hours as needed for cramping or headache.     levETIRAcetam  (KEPPRA  XR) 500 MG 24 hr tablet Take 6 tablets (3,000 mg total) by mouth at bedtime. 180 tablet 11   Multiple Vitamin (MULTIVITAMIN WITH MINERALS) TABS tablet Take  1 tablet by mouth daily. 30 tablet 3   PHENYTEK  300 MG ER capsule Take one capsule (300 mg dose) by mouth daily. BRAND NAME MEDICALLY NECESSARY 90 capsule 5   propranolol  (INDERAL ) 10 MG tablet Take 1 tablet (10 mg total) by mouth 2 (two) times daily as needed. (Patient taking differently: Take 10 mg by mouth 2 (two) times daily as needed (anxiety).) 60 tablet 2   thiamine  (VITAMIN B1) 100 MG tablet Take 1 tablet (100 mg total) by mouth daily. 90 tablet 3   TYLENOL  500 MG tablet Take 500-1,000 mg by mouth every 6 (six) hours as needed for mild pain (or headaches).     No current facility-administered medications for this visit.     Objective:  Psychiatric Specialty Exam:  General Appearance: appears at stated age, casually dressed and groomed   Behavior: pleasant and cooperative   Psychomotor Activity: no psychomotor agitation or retardation noted   Eye Contact: fair  Speech: normal amount, volume and fluency    Mood: euthymic  Affect: congruent, pleasant and interactive   Thought Process: linear, goal directed, no circumstantial or tangential thought process noted, no racing thoughts or flight of ideas  Descriptions of Associations: intact   Thought Content Hallucinations: denies AH, VH , does not appear responding to stimuli  Delusions: no paranoia, delusions of control, grandeur, ideas of reference, thought broadcasting, and magical thinking  Suicidal Thoughts: denies SI, intention, plan  Homicidal Thoughts: denies HI, intention, plan   Alertness/Orientation: alert and fully oriented   Insight: fair Judgment: fair  Memory: intact   Executive Functions  Concentration: intact  Attention Span: fair  Recall: intact  Fund of Knowledge: fair   Physical Exam  General: Pleasant, well-appearing. No acute distress. Pulmonary: Normal effort. No wheezing or rales. Neuro: A&Ox3.No focal deficit.  Review of Systems  No reported symptoms   Metabolic Disorder Labs: No  results found for: HGBA1C, MPG No results found for: PROLACTIN Lab Results  Component Value Date   TRIG 158 (H) 03/02/2018   Lab Results  Component Value Date   TSH 1.706 01/21/2024   TSH 4.448 04/13/2023    Therapeutic Level Labs: No results found for: LITHIUM No results found for: VALPROATE No results found for: CBMZ  Screenings: CAGE-AID    Flowsheet Row ED to Hosp-Admission (  Discharged) from 11/07/2020 in [facility]  CAGE-AID Score 3   GAD-7    Flowsheet Row Counselor from 07/31/2023 in [facility] Office Visit from 02/04/2023 in [facility] [place]  Total GAD-7 Score 5 8   PHQ2-9    Flowsheet Row Counselor from 07/31/2023 in [facility] Office Visit from 04/12/2023 in [facility] - [facility] Dept Of [place]. [facility] Office Visit from 02/04/2023 in [facility] - [facility] Dept Of [place]. [facility] Office Visit from 10/17/2022 in [facility] - [facility] Of [place]. [facility] Office Visit from 11/08/2021 in [facility] - [facility] Dept Of [place]. [facility]  PHQ-2 Total Score 1 1 2  0 3  PHQ-9 Total Score -- 5 12 -- 11   Flowsheet Row ED to Hosp-Admission (Discharged) from 01/20/2024 in [facility] at [facility] Counselor from 07/31/2023 in [facility] ED to Hosp-Admission (Discharged) from 04/12/2023 in [place] [facility] [place] [facility]  C-SSRS RISK CATEGORY No Risk Moderate Risk No Risk    Ismael Franco, MD PGY-3 Psychiatry Resident

## 2024-04-21 ENCOUNTER — Ambulatory Visit (INDEPENDENT_AMBULATORY_CARE_PROVIDER_SITE_OTHER): Admitting: Psychiatry

## 2024-04-21 VITALS — BP 104/73 | Wt 95.0 lb

## 2024-04-21 DIAGNOSIS — F410 Panic disorder [episodic paroxysmal anxiety] without agoraphobia: Secondary | ICD-10-CM | POA: Diagnosis not present

## 2024-04-21 DIAGNOSIS — F064 Anxiety disorder due to known physiological condition: Secondary | ICD-10-CM | POA: Diagnosis not present

## 2024-05-15 ENCOUNTER — Other Ambulatory Visit (HOSPITAL_COMMUNITY): Payer: Self-pay

## 2024-05-15 ENCOUNTER — Other Ambulatory Visit: Payer: Self-pay | Admitting: Student

## 2024-05-15 ENCOUNTER — Other Ambulatory Visit: Payer: Self-pay

## 2024-05-15 DIAGNOSIS — D7589 Other specified diseases of blood and blood-forming organs: Secondary | ICD-10-CM

## 2024-05-15 MED ORDER — FOLIC ACID 1 MG PO TABS
1.0000 mg | ORAL_TABLET | Freq: Every day | ORAL | 2 refills | Status: AC
  Filled 2024-05-15: qty 30, 30d supply, fill #0

## 2024-07-21 ENCOUNTER — Encounter (HOSPITAL_COMMUNITY): Admitting: Psychiatry
